# Patient Record
Sex: Female | Born: 1955
Health system: Southern US, Community
[De-identification: ages and names within clinical notes are randomized; demographics above are authoritative.]

## PROBLEM LIST (undated history)

## (undated) DIAGNOSIS — F1910 Other psychoactive substance abuse, uncomplicated: Secondary | ICD-10-CM

## (undated) DIAGNOSIS — I10 Essential (primary) hypertension: Secondary | ICD-10-CM

## (undated) DIAGNOSIS — E119 Type 2 diabetes mellitus without complications: Secondary | ICD-10-CM

## (undated) DIAGNOSIS — K219 Gastro-esophageal reflux disease without esophagitis: Secondary | ICD-10-CM

## (undated) DIAGNOSIS — M1990 Unspecified osteoarthritis, unspecified site: Secondary | ICD-10-CM

## (undated) DIAGNOSIS — N189 Chronic kidney disease, unspecified: Secondary | ICD-10-CM

## (undated) DIAGNOSIS — J449 Chronic obstructive pulmonary disease, unspecified: Secondary | ICD-10-CM

## (undated) DIAGNOSIS — E785 Hyperlipidemia, unspecified: Secondary | ICD-10-CM

## (undated) DIAGNOSIS — I739 Peripheral vascular disease, unspecified: Secondary | ICD-10-CM

## (undated) HISTORY — PX: LYMPHADENECTOMY: SHX15

## (undated) HISTORY — DX: Unspecified osteoarthritis, unspecified site: M19.90

## (undated) HISTORY — DX: Peripheral vascular disease, unspecified: I73.9

## (undated) HISTORY — PX: TONSILLECTOMY: SUR1361

## (undated) HISTORY — DX: Gastro-esophageal reflux disease without esophagitis: K21.9

## (undated) HISTORY — PX: INDUCED ABORTION: SHX677

## (undated) HISTORY — PX: THROAT SURGERY: SHX803

## (undated) HISTORY — PX: TUBAL LIGATION: SHX77

## (undated) HISTORY — DX: Chronic obstructive pulmonary disease, unspecified: J44.9

## (undated) HISTORY — DX: Essential (primary) hypertension: I10

## (undated) HISTORY — DX: Hyperlipidemia, unspecified: E78.5

## (undated) HISTORY — DX: Chronic kidney disease, unspecified: N18.9

## (undated) HISTORY — PX: WISDOM TOOTH EXTRACTION: SHX21

## (undated) HISTORY — PX: ANKLE SURGERY: SHX546

---

## 2005-02-18 ENCOUNTER — Other Ambulatory Visit: Payer: Self-pay

## 2005-03-09 ENCOUNTER — Ambulatory Visit: Payer: Self-pay | Admitting: Unknown Physician Specialty

## 2005-05-27 ENCOUNTER — Emergency Department: Payer: Self-pay | Admitting: Emergency Medicine

## 2005-10-03 ENCOUNTER — Inpatient Hospital Stay: Payer: Self-pay | Admitting: Internal Medicine

## 2006-02-19 ENCOUNTER — Emergency Department: Payer: Self-pay | Admitting: General Practice

## 2007-03-18 IMAGING — CR DG ABDOMEN 1V
1 series · 1 of 1 positions shown · non-contrast
Comparison: none

REASON FOR EXAM: Vomiting
COMMENTS:  LMP: Post-Menopausal

[view not recorded]
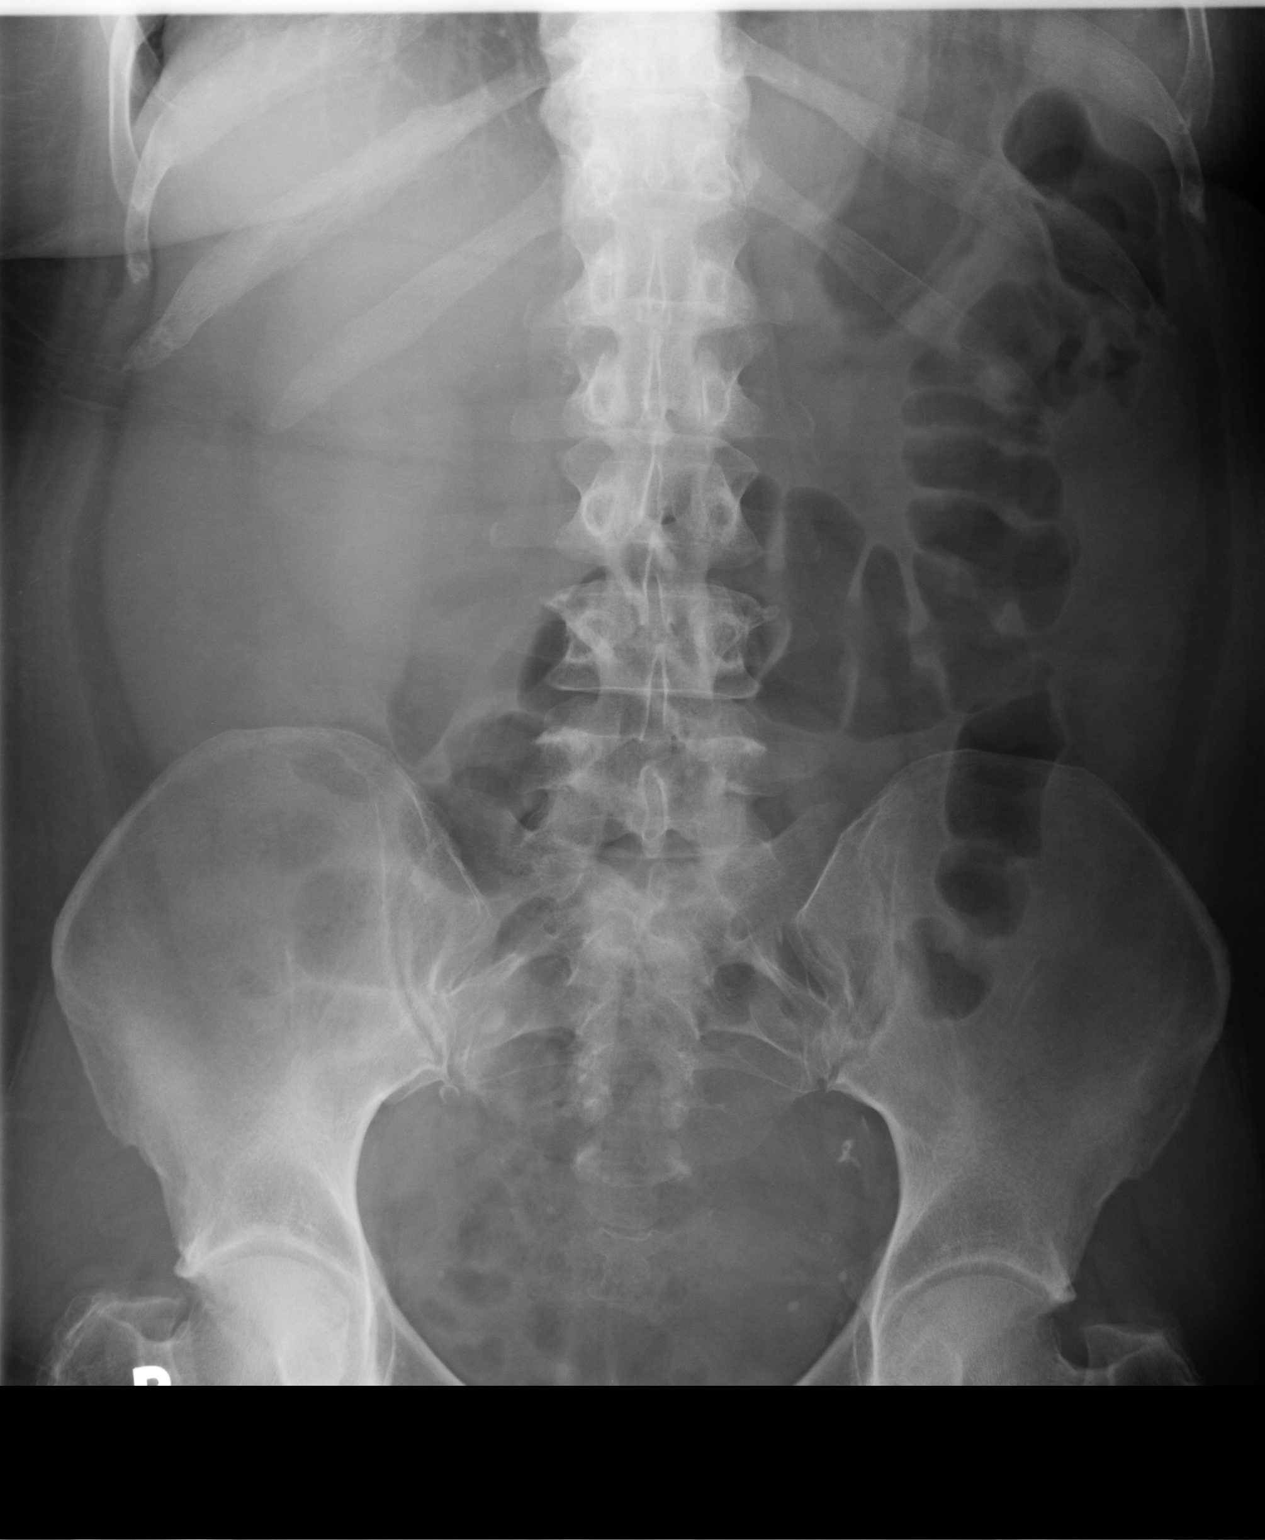

[1 of 1 positions shown; findings below may reference images not displayed]

PROCEDURE:     DXR - DXR KIDNEY URETER BLADDER  - October 03, 2005 [DATE]

RESULT:          Atherosclerotic calcification is seen within the iliac
artery.  Phleboliths are present.  The bowel gas pattern is unremarkable.
No definite urinary tract stones can be identified.  If there is concern for
urinary calculi, then CT correlation would be recommended.
IMPRESSION: No evidence of bowel obstruction.

## 2008-06-27 ENCOUNTER — Emergency Department: Payer: Self-pay | Admitting: Emergency Medicine

## 2009-03-20 ENCOUNTER — Emergency Department: Payer: Self-pay | Admitting: Emergency Medicine

## 2010-02-03 ENCOUNTER — Emergency Department: Payer: Self-pay | Admitting: Emergency Medicine

## 2010-08-11 ENCOUNTER — Emergency Department: Payer: Self-pay | Admitting: Emergency Medicine

## 2011-09-01 ENCOUNTER — Emergency Department: Payer: Self-pay | Admitting: Emergency Medicine

## 2012-05-31 ENCOUNTER — Ambulatory Visit: Payer: Self-pay

## 2012-12-12 ENCOUNTER — Emergency Department: Payer: Self-pay | Admitting: Emergency Medicine

## 2012-12-12 LAB — COMPREHENSIVE METABOLIC PANEL
Albumin: 3.2 g/dL — ABNORMAL LOW (ref 3.4–5.0)
Alkaline Phosphatase: 129 U/L (ref 50–136)
BUN: 16 mg/dL (ref 7–18)
Bilirubin,Total: 0.4 mg/dL (ref 0.2–1.0)
Calcium, Total: 9.4 mg/dL (ref 8.5–10.1)
Chloride: 95 mmol/L — ABNORMAL LOW (ref 98–107)
Co2: 26 mmol/L (ref 21–32)
Creatinine: 1.02 mg/dL (ref 0.60–1.30)
EGFR (Non-African Amer.): >60
Osmolality: 281 (ref 275–301)
Potassium: 2.4 mmol/L — CL (ref 3.5–5.1)
Sodium: 133 mmol/L — ABNORMAL LOW (ref 136–145)
Total Protein: 8.2 g/dL (ref 6.4–8.2)

## 2012-12-12 LAB — CBC
HCT: 38.4 % (ref 35.0–47.0)
HGB: 13.3 g/dL (ref 12.0–16.0)
MCHC: 34.7 g/dL (ref 32.0–36.0)
MCV: 92 fL (ref 80–100)
Platelet: 175 10*3/uL (ref 150–440)
WBC: 13.2 10*3/uL — ABNORMAL HIGH (ref 3.6–11.0)

## 2012-12-12 LAB — BASIC METABOLIC PANEL
Anion Gap: 9 (ref 7–16)
BUN: 12 mg/dL (ref 7–18)
Calcium, Total: 7.8 mg/dL — ABNORMAL LOW (ref 8.5–10.1)
Co2: 24 mmol/L (ref 21–32)
EGFR (African American): >60
EGFR (Non-African Amer.): >60
Osmolality: 287 (ref 275–301)
Sodium: 139 mmol/L (ref 136–145)

## 2012-12-12 LAB — URINALYSIS, COMPLETE
Bilirubin,UR: NEGATIVE
Nitrite: NEGATIVE
Ph: 6 (ref 4.5–8.0)
Protein: 30
WBC UR: 152 /HPF (ref 0–5)

## 2012-12-12 LAB — ETHANOL
Ethanol %: 0.02 % (ref 0.000–0.080)
Ethanol: 20 mg/dL

## 2012-12-14 LAB — URINE CULTURE

## 2014-05-23 ENCOUNTER — Ambulatory Visit: Payer: Self-pay | Admitting: Physician Assistant

## 2016-01-28 ENCOUNTER — Emergency Department: Payer: BLUE CROSS/BLUE SHIELD

## 2016-01-28 ENCOUNTER — Encounter: Payer: Self-pay | Admitting: Emergency Medicine

## 2016-01-28 ENCOUNTER — Emergency Department
Admission: EM | Admit: 2016-01-28 | Discharge: 2016-01-28 | Disposition: A | Discharge status: Home / Self Care | Payer: BLUE CROSS/BLUE SHIELD | Attending: Emergency Medicine | Admitting: Emergency Medicine

## 2016-01-28 DIAGNOSIS — M5432 Sciatica, left side: Secondary | ICD-10-CM

## 2016-01-28 DIAGNOSIS — Z7984 Long term (current) use of oral hypoglycemic drugs: Secondary | ICD-10-CM | POA: Insufficient documentation

## 2016-01-28 DIAGNOSIS — F17200 Nicotine dependence, unspecified, uncomplicated: Secondary | ICD-10-CM | POA: Diagnosis not present

## 2016-01-28 DIAGNOSIS — Z79899 Other long term (current) drug therapy: Secondary | ICD-10-CM | POA: Insufficient documentation

## 2016-01-28 DIAGNOSIS — M545 Low back pain: Secondary | ICD-10-CM | POA: Diagnosis present

## 2016-01-28 MED ORDER — CYCLOBENZAPRINE HCL 5 MG PO TABS: 5.0000 mg | ORAL_TABLET | Freq: Three times a day (TID) | ORAL | Status: DC | PRN

## 2016-01-28 MED ORDER — KETOROLAC TROMETHAMINE 30 MG/ML IJ SOLN
30.0000 mg | Freq: Once | INTRAMUSCULAR | Status: AC
  Administered 2016-01-28: 30 mg via INTRAMUSCULAR
  Filled 2016-01-28: qty 1

## 2016-01-28 NOTE — Discharge Instructions (Signed)
Back Exercises The following exercises strengthen the muscles that help to support the back. They also help to keep the lower back flexible. Doing these exercises can help to prevent back pain or lessen existing pain. If you have back pain or discomfort, try doing these exercises 2-3 times each day or as told by your health care provider. When the pain goes away, do them once each day, but increase the number of times that you repeat the steps for each exercise (do more repetitions). If you do not have back pain or discomfort, do these exercises once each day or as told by your health care provider. EXERCISES Single Knee to Chest Repeat these steps 3-5 times for each leg:  Lie on your back on a firm bed or the floor with your legs extended.  Bring one knee to your chest. Your other leg should stay extended and in contact with the floor.  Hold your knee in place by grabbing your knee or thigh.  Pull on your knee until you feel a gentle stretch in your lower back.  Hold the stretch for 10-30 seconds.  Slowly release and straighten your leg. Pelvic Tilt Repeat these steps 5-10 times:  Lie on your back on a firm bed or the floor with your legs extended.  Bend your knees so they are pointing toward the ceiling and your feet are flat on the floor.  Tighten your lower abdominal muscles to press your lower back against the floor. This motion will tilt your pelvis so your tailbone points up toward the ceiling instead of pointing to your feet or the floor.  With gentle tension and even breathing, hold this position for 5-10 seconds. Cat-Cow Repeat these steps until your lower back becomes more flexible:  Get into a hands-and-knees position on a firm surface. Keep your hands under your shoulders, and keep your knees under your hips. You may place padding under your knees for comfort.  Let your head hang down, and point your tailbone toward the floor so your lower back becomes rounded like the  back of a cat.  Hold this position for 5 seconds.  Slowly lift your head and point your tailbone up toward the ceiling so your back forms a sagging arch like the back of a cow.  Hold this position for 5 seconds. Press-Ups Repeat these steps 5-10 times: 1. Lie on your abdomen (face-down) on the floor. 2. Place your palms near your head, about shoulder-width apart. 3. While you keep your back as relaxed as possible and keep your hips on the floor, slowly straighten your arms to raise the top half of your body and lift your shoulders. Do not use your back muscles to raise your upper torso. You may adjust the placement of your hands to make yourself more comfortable. 4. Hold this position for 5 seconds while you keep your back relaxed. 5. Slowly return to lying flat on the floor. Bridges Repeat these steps 10 times: 1. Lie on your back on a firm surface. 2. Bend your knees so they are pointing toward the ceiling and your feet are flat on the floor. 3. Tighten your buttocks muscles and lift your buttocks off of the floor until your waist is at almost the same height as your knees. You should feel the muscles working in your buttocks and the back of your thighs. If you do not feel these muscles, slide your feet 1-2 inches farther away from your buttocks. 4. Hold this position for 3-5  seconds. 5. Slowly lower your hips to the starting position, and allow your buttocks muscles to relax completely. If this exercise is too easy, try doing it with your arms crossed over your chest. Abdominal Crunches Repeat these steps 5-10 times: 1. Lie on your back on a firm bed or the floor with your legs extended. 2. Bend your knees so they are pointing toward the ceiling and your feet are flat on the floor. 3. Cross your arms over your chest. 4. Tip your chin slightly toward your chest without bending your neck. 5. Tighten your abdominal muscles and slowly raise your trunk (torso) high enough to lift your  shoulder blades a tiny bit off of the floor. Avoid raising your torso higher than that, because it can put too much stress on your low back and it does not help to strengthen your abdominal muscles. 6. Slowly return to your starting position. Back Lifts Repeat these steps 5-10 times: 1. Lie on your abdomen (face-down) with your arms at your sides, and rest your forehead on the floor. 2. Tighten the muscles in your legs and your buttocks. 3. Slowly lift your chest off of the floor while you keep your hips pressed to the floor. Keep the back of your head in line with the curve in your back. Your eyes should be looking at the floor. 4. Hold this position for 3-5 seconds. 5. Slowly return to your starting position. SEEK MEDICAL CARE IF:  Your back pain or discomfort gets much worse when you do an exercise.  Your back pain or discomfort does not lessen within 2 hours after you exercise. If you have any of these problems, stop doing these exercises right away. Do not do them again unless your health care provider says that you can. SEEK IMMEDIATE MEDICAL CARE IF:  You develop sudden, severe back pain. If this happens, stop doing the exercises right away. Do not do them again unless your health care provider says that you can.   This information is not intended to replace advice given to you by your health care provider. Make sure you discuss any questions you have with your health care provider.   Document Released: 10/08/2004 Document Revised: 05/22/2015 Document Reviewed: 10/25/2014 Elsevier Interactive Patient Education 2016 [employer]  Sciatica Sciatica is pain, weakness, numbness, or tingling along your sciatic nerve. The nerve starts in the lower back and runs down the back of each leg. Nerve damage or certain conditions pinch or put pressure on the sciatic nerve. This causes the pain, weakness, and other discomforts of sciatica. HOME CARE   Only take medicine as told by your  doctor.  Apply ice to the affected area for 20 minutes. Do this 3-4 times a day for the first 48-72 hours. Then try heat in the same way.  Exercise, stretch, or do your usual activities if these do not make your pain worse.  Go to physical therapy as told by your doctor.  Keep all doctor visits as told.  Do not wear high heels or shoes that are not supportive.  Get a firm mattress if your mattress is too soft to lessen pain and discomfort. GET HELP RIGHT AWAY IF:   You cannot control when you poop (bowel movement) or pee (urinate).  You have more weakness in your lower back, lower belly (pelvis), butt (buttocks), or legs.  You have redness or puffiness (swelling) of your back.  You have a burning feeling when you pee.  You have pain  that gets worse when you lie down.  You have pain that wakes you from your sleep.  Your pain is worse than past pain.  Your pain lasts longer than 4 weeks.  You are suddenly losing weight without reason. MAKE SURE YOU:   Understand these instructions.  Will watch this condition.  Will get help right away if you are not doing well or get worse.   This information is not intended to replace advice given to you by your health care provider. Make sure you discuss any questions you have with your health care provider.   Document Released: 06/09/2008 Document Revised: 05/22/2015 Document Reviewed: 01/10/2012 Elsevier Interactive Patient Education [employer].

## 2016-01-28 NOTE — ED Provider Notes (Signed)
[facility] Emergency Department Provider Note  ____________________________________________  Time seen: Approximately 11:31 AM  I have reviewed the triage vital signs and the nursing notes.   HISTORY  Chief Complaint Hip Pain    HPI Alexandra Campos is a 60 y.o. female , NAD, presents to the emergency department with left lower back and hip pain for the last 2 weeks. States she has a history of arthritis that can flare from time to time. Had episode of lower back pain a few weeks ago in which lasted 1 day and resolved. Has had lower back pain radiating down into the left hip and leg over the last couple weeks that is not improving. States she has worked for the last 14 days straight which she believes aggravated her pain.Denies any falls, injuries or traumas. Has not noted any redness, warmth, swelling to the left lower extremity. Has been able to ambulate but with increasing pain over the last couple of days. Denies saddle paresthesias nor loss of bowel control. Patient notes she has chronic bladder incontinence but has noted no worsening of such recently. Was referred to Dr. Tamala Julian by her primary care provider but has not followed up with them at this time for her arthritic pain.   History reviewed. No pertinent past medical history.  There are no active problems to display for this patient.   History reviewed. No pertinent past surgical history.  Current Outpatient Rx  Name  Route  Sig  Dispense  Refill  . gabapentin (NEURONTIN) 300 MG capsule   Oral   Take 300 mg by mouth 3 (three) times daily.         . hydrochlorothiazide (HYDRODIURIL) 25 MG tablet   Oral   Take 25 mg by mouth daily.         Marland Kitchen lisinopril (PRINIVIL,ZESTRIL) 20 MG tablet   Oral   Take 20 mg by mouth daily.         . metFORMIN (GLUCOPHAGE) 1000 MG tablet   Oral   Take 1,000 mg by mouth 2 (two) times daily with a meal.         . cyclobenzaprine (FLEXERIL) 5 MG tablet    Oral   Take 1 tablet (5 mg total) by mouth every 8 (eight) hours as needed for muscle spasms.   21 tablet   0     Allergies Review of patient's allergies indicates no known allergies.  No family history on file.  Social History Social History  Substance Use Topics  . Smoking status: Current Every Day Smoker  . Smokeless tobacco: None  . Alcohol Use: None     Review of Systems  Constitutional: No fever/chills, fatigue Cardiovascular: No chest pain. Respiratory: No shortness of breath.  Gastrointestinal: No abdominal pain.  No nausea, vomiting.   Genitourinary: Negative for dysuria. No hematuria. No urinary hesitancy, urgency or increased frequency. Musculoskeletal: Positive left sided lower back pain and left hip pain. Skin: Negative for rash, skin sores. Neurological: Negative for headaches, focal weakness or numbness. No tingling. No saddle paresthesias nor loss of bowel or bladder control. 10-point ROS otherwise negative.  ____________________________________________   PHYSICAL EXAM:  VITAL SIGNS: ED Triage Vitals  Enc Vitals Group     BP 01/28/16 1059 143/97 mmHg     Pulse Rate 01/28/16 1059 92     Resp 01/28/16 1059 18     Temp 01/28/16 1059 98.1 F (36.7 C)     Temp Source 01/28/16 1059 Oral  SpO2 01/28/16 1059 98 %     Weight 01/28/16 1059 220 lb (99.791 kg)     Height 01/28/16 1059 5\' 2"  (1.575 m)     Head Cir --      Peak Flow --      Pain Score 01/28/16 1100 7     Pain Loc --      Pain Edu? --      Excl. in [place]? --      Constitutional: Alert and oriented. Well appearing and in no acute distress. Eyes: Conjunctivae are normal.  Head: Atraumatic. Neck: No cervical spine tenderness to palpation. Supple with full range of motion. Hematological/Lymphatic/Immunilogical: No cervical lymphadenopathy. Cardiovascular: Normal rate, regular rhythm. Normal S1 and S2.  Good peripheral circulation with 2+ pulses noted in bilateral lower  extremities. Respiratory: Normal respiratory effort without tachypnea or retractions. Lungs CTAB with breath sounds noted in all lung fields. Gastrointestinal: Soft and nontender. No distention. No CVA tenderness. Musculoskeletal: Moderate left SI joint tenderness. Positive left-sided straight leg raise, negative right straight leg raise. Decreased range of motion of the lumbar spine due to pain about the left lower back. No tenderness to palpation with pressure of the left hip or pelvic girdle. Full range of motion of the left knee without pain. No lower extremity tenderness nor edema.  No joint effusions. Neurologic:  Normal speech and language. No gross focal neurologic deficits are appreciated.  Skin:  Skin is warm, dry and intact. No rash noted. Psychiatric: Mood and affect are normal. Speech and behavior are normal. Patient exhibits appropriate insight and judgement.   ____________________________________________   LABS  None ____________________________________________  EKG  None ____________________________________________  RADIOLOGY I have personally viewed and evaluated these images (plain radiographs) as part of my medical decision making, as well as reviewing the written report by the radiologist.  Dg Hip Unilat With Pelvis 2-3 Views Left  01/28/2016  CLINICAL DATA:  Left hip pain for 3 weeks without known injury. EXAM: DG HIP (WITH OR WITHOUT PELVIS) 2-3V LEFT COMPARISON:  None. FINDINGS: There is no evidence of hip fracture or dislocation. There is no evidence of arthropathy or other focal bone abnormality. IMPRESSION: Normal left hip. Electronically Signed   By: Marijo Conception, M.D.   On: 01/28/2016 11:54    ____________________________________________    PROCEDURES  Procedure(s) performed: None    Medications  ketorolac (TORADOL) 30 MG/ML injection 30 mg (30 mg Intramuscular Given 01/28/16 1156)     ____________________________________________   INITIAL  IMPRESSION / ASSESSMENT AND PLAN / ED COURSE  Pertinent imaging results that were available during my care of the patient were reviewed by me and considered in my medical decision making (see chart for details).  Patient's diagnosis is consistent with Left-sided sciatica. Patient tolerated Toradol well without side effects and with improvement in pain. Patient will be discharged home with prescriptions for Flexeril to take as directed. Patient may take Tylenol as needed for pain. Patient is to follow up with Dr. Rudene Christians in orthopedics if symptoms persist past this treatment course. Patient is given ED precautions to return to the ED for any worsening or new symptoms.    ____________________________________________  FINAL CLINICAL IMPRESSION(S) / ED DIAGNOSES  Final diagnoses:  Sciatica of left side      NEW MEDICATIONS STARTED DURING THIS VISIT:  New Prescriptions   CYCLOBENZAPRINE (FLEXERIL) 5 MG TABLET    Take 1 tablet (5 mg total) by mouth every 8 (eight) hours as needed for muscle spasms.  Braxton Feathers, PA-C 01/28/16 1207

## 2016-01-28 NOTE — ED Notes (Signed)
Reports left hip pain rad down leg.  +PMS to foot

## 2017-11-20 ENCOUNTER — Emergency Department
Admission: EM | Admit: 2017-11-20 | Discharge: 2017-11-20 | Disposition: A | Discharge status: Home / Self Care | Payer: BLUE CROSS/BLUE SHIELD | Attending: Emergency Medicine | Admitting: Emergency Medicine

## 2017-11-20 ENCOUNTER — Other Ambulatory Visit: Payer: Self-pay

## 2017-11-20 ENCOUNTER — Encounter: Payer: Self-pay | Admitting: Emergency Medicine

## 2017-11-20 DIAGNOSIS — Z5321 Procedure and treatment not carried out due to patient leaving prior to being seen by health care provider: Secondary | ICD-10-CM | POA: Insufficient documentation

## 2017-11-20 DIAGNOSIS — R42 Dizziness and giddiness: Secondary | ICD-10-CM | POA: Insufficient documentation

## 2017-11-20 HISTORY — DX: Type 2 diabetes mellitus without complications: E11.9

## 2017-11-20 LAB — BASIC METABOLIC PANEL
Anion gap: 10 (ref 5–15)
BUN: 24 mg/dL — AB (ref 6–20)
CO2: 27 mmol/L (ref 22–32)
CREATININE: 0.98 mg/dL (ref 0.44–1.00)
Calcium: 9.5 mg/dL (ref 8.9–10.3)
Chloride: 103 mmol/L (ref 101–111)
GFR calc Af Amer: >60 mL/min (ref >60)
Glucose, Bld: 147 mg/dL — ABNORMAL HIGH (ref 65–99)
Potassium: 3.6 mmol/L (ref 3.5–5.1)
SODIUM: 140 mmol/L (ref 135–145)

## 2017-11-20 LAB — CBC
HCT: 40.3 % (ref 35.0–47.0)
Hemoglobin: 13.6 g/dL (ref 12.0–16.0)
MCH: 32.3 pg (ref 26.0–34.0)
MCHC: 33.8 g/dL (ref 32.0–36.0)
MCV: 95.6 fL (ref 80.0–100.0)
PLATELETS: 229 10*3/uL (ref 150–440)
RBC: 4.21 MIL/uL (ref 3.80–5.20)
RDW: 13.8 % (ref 11.5–14.5)
WBC: 5.2 10*3/uL (ref 3.6–11.0)

## 2017-11-20 LAB — GLUCOSE, CAPILLARY: Glucose-Capillary: 134 mg/dL — ABNORMAL HIGH (ref 65–99)

## 2017-11-20 NOTE — ED Triage Notes (Signed)
Patient while at work she became dizzy and nauseated.

## 2017-11-22 ENCOUNTER — Encounter: Payer: Self-pay | Admitting: Emergency Medicine

## 2017-11-22 ENCOUNTER — Emergency Department
Admission: EM | Admit: 2017-11-22 | Discharge: 2017-11-23 | Disposition: A | Discharge status: Home / Self Care | Payer: Self-pay | Attending: Emergency Medicine | Admitting: Emergency Medicine

## 2017-11-22 ENCOUNTER — Other Ambulatory Visit: Payer: Self-pay

## 2017-11-22 DIAGNOSIS — Z79899 Other long term (current) drug therapy: Secondary | ICD-10-CM | POA: Insufficient documentation

## 2017-11-22 DIAGNOSIS — F17210 Nicotine dependence, cigarettes, uncomplicated: Secondary | ICD-10-CM | POA: Insufficient documentation

## 2017-11-22 DIAGNOSIS — R42 Dizziness and giddiness: Secondary | ICD-10-CM | POA: Insufficient documentation

## 2017-11-22 DIAGNOSIS — E119 Type 2 diabetes mellitus without complications: Secondary | ICD-10-CM | POA: Insufficient documentation

## 2017-11-22 DIAGNOSIS — Z7984 Long term (current) use of oral hypoglycemic drugs: Secondary | ICD-10-CM | POA: Insufficient documentation

## 2017-11-22 DIAGNOSIS — E860 Dehydration: Secondary | ICD-10-CM | POA: Insufficient documentation

## 2017-11-22 LAB — COMPREHENSIVE METABOLIC PANEL
ALK PHOS: 65 U/L (ref 38–126)
ALT: 15 U/L (ref 14–54)
AST: 14 U/L — ABNORMAL LOW (ref 15–41)
Albumin: 4.1 g/dL (ref 3.5–5.0)
Anion gap: 12 (ref 5–15)
BILIRUBIN TOTAL: 0.6 mg/dL (ref 0.3–1.2)
BUN: 21 mg/dL — ABNORMAL HIGH (ref 6–20)
CALCIUM: 9.8 mg/dL (ref 8.9–10.3)
CO2: 28 mmol/L (ref 22–32)
CREATININE: 0.99 mg/dL (ref 0.44–1.00)
Chloride: 98 mmol/L — ABNORMAL LOW (ref 101–111)
GFR calc non Af Amer: >60 mL/min (ref >60)
Glucose, Bld: 170 mg/dL — ABNORMAL HIGH (ref 65–99)
Potassium: 3.8 mmol/L (ref 3.5–5.1)
SODIUM: 138 mmol/L (ref 135–145)
TOTAL PROTEIN: 8.1 g/dL (ref 6.5–8.1)

## 2017-11-22 LAB — CBC
HEMATOCRIT: 41.6 % (ref 35.0–47.0)
HEMOGLOBIN: 13.8 g/dL (ref 12.0–16.0)
MCH: 31.6 pg (ref 26.0–34.0)
MCHC: 33.2 g/dL (ref 32.0–36.0)
MCV: 95.5 fL (ref 80.0–100.0)
Platelets: 242 10*3/uL (ref 150–440)
RBC: 4.36 MIL/uL (ref 3.80–5.20)
RDW: 13.4 % (ref 11.5–14.5)
WBC: 6.1 10*3/uL (ref 3.6–11.0)

## 2017-11-22 LAB — TROPONIN I
Troponin I: <0.03 ng/mL (ref <0.03)
Troponin I: <0.03 ng/mL (ref <0.03)

## 2017-11-22 MED ORDER — SODIUM CHLORIDE 0.9 % IV BOLUS (SEPSIS)
1000.0000 mL | Freq: Once | INTRAVENOUS | Status: AC
  Administered 2017-11-22: 1000 mL via INTRAVENOUS

## 2017-11-22 MED ORDER — MECLIZINE HCL 25 MG PO TABS
25.0000 mg | ORAL_TABLET | Freq: Once | ORAL | Status: AC
  Administered 2017-11-22: 25 mg via ORAL
  Filled 2017-11-22: qty 1

## 2017-11-22 NOTE — Discharge Instructions (Signed)
Fortunately today your blood work was reassuring.  Please make sure you remain well-hydrated and follow-up with your primary care physician in 2 days for reevaluation.  It was a pleasure to take care of you today, and thank you for coming to our emergency department.  If you have any questions or concerns before leaving please ask the nurse to grab me and I'm more than happy to go through your aftercare instructions again.  If you were prescribed any opioid pain medication today such as Norco, Vicodin, Percocet, morphine, hydrocodone, or oxycodone please make sure you do not drive when you are taking this medication as it can alter your ability to drive safely.  If you have any concerns once you are home that you are not improving or are in fact getting worse before you can make it to your follow-up appointment, please do not hesitate to call 911 and come back for further evaluation.  Darel Hong, MD  Results for orders placed or performed during the hospital encounter of 11/22/17  CBC  Result Value Ref Range   WBC 6.1 3.6 - 11.0 K/uL   RBC 4.36 3.80 - 5.20 MIL/uL   Hemoglobin 13.8 12.0 - 16.0 g/dL   HCT 41.6 35.0 - 47.0 %   MCV 95.5 80.0 - 100.0 fL   MCH 31.6 26.0 - 34.0 pg   MCHC 33.2 32.0 - 36.0 g/dL   RDW 13.4 11.5 - 14.5 %   Platelets 242 150 - 440 K/uL  Troponin I  Result Value Ref Range   Troponin I <0.03 <0.03 ng/mL  Comprehensive metabolic panel  Result Value Ref Range   Sodium 138 135 - 145 mmol/L   Potassium 3.8 3.5 - 5.1 mmol/L   Chloride 98 (L) 101 - 111 mmol/L   CO2 28 22 - 32 mmol/L   Glucose, Bld 170 (H) 65 - 99 mg/dL   BUN 21 (H) 6 - 20 mg/dL   Creatinine, Ser 0.99 0.44 - 1.00 mg/dL   Calcium 9.8 8.9 - 10.3 mg/dL   Total Protein 8.1 6.5 - 8.1 g/dL   Albumin 4.1 3.5 - 5.0 g/dL   AST 14 (L) 15 - 41 U/L   ALT 15 14 - 54 U/L   Alkaline Phosphatase 65 38 - 126 U/L   Total Bilirubin 0.6 0.3 - 1.2 mg/dL   GFR calc non Af Amer >60 >60 mL/min   GFR calc Af Amer  >60 >60 mL/min   Anion gap 12 5 - 15  Troponin I  Result Value Ref Range   Troponin I <0.03 <0.03 ng/mL

## 2017-11-22 NOTE — ED Provider Notes (Signed)
[facility] Emergency Department Provider Note  ____________________________________________   First MD Initiated Contact with Patient 11/22/17 2120     (approximate)  I have reviewed the triage vital signs and the nursing notes.   HISTORY  Chief Complaint Dizziness and Arm Pain   HPI Alexandra Campos is a 62 y.o. female who self presents to the emergency department with several days of intermittent lightheadedness.  Her symptoms are mild to moderate severity lasting 1-2 minutes at a time.  They are worsened by standing up and improved with rest.  Associated with some nausea and numbness in her left arm.  No vomiting.  No pain.  No tinnitus.  No room spinning.  No history of the same.  No headache.  Her symptoms are currently mild to moderate intermittent.  Past Medical History:  Diagnosis Date  . Diabetes mellitus without complication ([place])     There are no active problems to display for this patient.   History reviewed. No pertinent surgical history.  Prior to Admission medications   Medication Sig Start Date End Date Taking? Authorizing Provider  cyclobenzaprine (FLEXERIL) 5 MG tablet Take 1 tablet (5 mg total) by mouth every 8 (eight) hours as needed for muscle spasms. 01/28/16   Hagler, Jami L, PA-C  gabapentin (NEURONTIN) 300 MG capsule Take 300 mg by mouth 3 (three) times daily.    [provider]  hydrochlorothiazide (HYDRODIURIL) 25 MG tablet Take 25 mg by mouth daily.    [provider]  lisinopril (PRINIVIL,ZESTRIL) 20 MG tablet Take 20 mg by mouth daily.    [provider]  metFORMIN (GLUCOPHAGE) 1000 MG tablet Take 1,000 mg by mouth 2 (two) times daily with a meal.    [provider]    Allergies Patient has no known allergies.  No family history on file.  Social History Social History   Tobacco Use  . Smoking status: Current Every Day Smoker    Packs/day: 1.00    Types: Cigarettes  Substance Use  Topics  . Alcohol use: Not on file  . Drug use: Not on file    Review of Systems Constitutional: No fever/chills Eyes: No visual changes. ENT: No sore throat. Cardiovascular: Denies chest pain. Respiratory: Denies shortness of breath. Gastrointestinal: No abdominal pain.  Positive for nausea, no vomiting.  No diarrhea.  No constipation. Genitourinary: Negative for dysuria. Musculoskeletal: Negative for back pain. Skin: Negative for rash. Neurological: Negative for headaches, focal weakness or numbness.   ____________________________________________   PHYSICAL EXAM:  VITAL SIGNS: ED Triage Vitals  Enc Vitals Group     BP 11/22/17 1753 (!) 179/90     Pulse Rate 11/22/17 1753 81     Resp 11/22/17 1753 18     Temp 11/22/17 1753 98.4 F (36.9 C)     Temp Source 11/22/17 1753 Oral     SpO2 11/22/17 1753 99 %     Weight 11/22/17 1754 225 lb (102.1 kg)     Height 11/22/17 1754 5' (1.524 m)     Head Circumference --      Peak Flow --      Pain Score 11/22/17 1754 8     Pain Loc --      Pain Edu? --      Excl. in [place]? --     Constitutional: Alert and oriented x4 pleasant cooperative speaks in full clear sentences no diaphoresis Eyes: PERRL EOMI. no nystagmus Head: Atraumatic.  Normal tympanic membranes bilaterally Nose: No  congestion/rhinnorhea. Mouth/Throat: No trismus Neck: No stridor.   Cardiovascular: Normal rate, regular rhythm. Grossly normal heart sounds.  Good peripheral circulation. Respiratory: Normal respiratory effort.  No retractions. Lungs CTAB and moving good air Gastrointestinal: Soft nontender Musculoskeletal: No lower extremity edema   Neurologic:  Normal speech and language.  No pronator drift 5 out of 5 grips biceps triceps hip flexion hip extension plantar flexion dorsiflexion sensation intact light touch throughout normal finger-nose-finger Skin:  Skin is warm, dry and intact. No rash noted. Psychiatric: Mood and affect are normal. Speech and  behavior are normal.    ____________________________________________   DIFFERENTIAL includes but not limited to  Central vertigo, peripheral vertigo, lightheaded, dehydration, acute coronary syndrome ____________________________________________   LABS (all labs ordered are listed, but only abnormal results are displayed)  Labs Reviewed  COMPREHENSIVE METABOLIC PANEL - Abnormal; Notable for the following components:      Result Value   Chloride 98 (*)    Glucose, Bld 170 (*)    BUN 21 (*)    AST 14 (*)    All other components within normal limits  CBC  TROPONIN I  TROPONIN I    Lab work reviewed by me with no signs of acute ischemia x2 __________________________________________  EKG  ED ECG REPORT I, Darel Hong, the attending physician, personally viewed and interpreted this ECG.  Date: 11/22/2017 EKG Time:  Rate: 77 Rhythm: normal sinus rhythm QRS Axis: normal Intervals: normal ST/T Wave abnormalities: normal Narrative Interpretation: no evidence of acute ischemia  ____________________________________________  RADIOLOGY   ____________________________________________   PROCEDURES  Procedure(s) performed: no  Procedures  Critical Care performed: no  Observation: no ____________________________________________   INITIAL IMPRESSION / ASSESSMENT AND PLAN / ED COURSE  Pertinent labs & imaging results that were available during my care of the patient were reviewed by me and considered in my medical decision making (see chart for details).  Patient symptoms are not consistent with vertigo.  She is troponin negative x2 and her EKG is nonischemic.  She feels improved after IV fluids.  I had a lengthy discussion with patient regarding the diagnostic uncertainty and the importance of following up with primary care.  She verbalized understanding agree with plan.      ____________________________________________   FINAL CLINICAL IMPRESSION(S) / ED  DIAGNOSES  Final diagnoses:  Dizziness  Dehydration      NEW MEDICATIONS STARTED DURING THIS VISIT:  New Prescriptions   No medications on file     Note:  This document was prepared using Dragon voice recognition software and may include unintentional dictation errors.     Darel Hong, MD 11/23/17 0005

## 2017-11-22 NOTE — ED Triage Notes (Signed)
States approx 1530 at work felt weak and dizzy L arm pain, states dizziness lasted approx 2 min.

## 2018-06-21 ENCOUNTER — Ambulatory Visit: Payer: Self-pay

## 2018-06-30 ENCOUNTER — Other Ambulatory Visit: Payer: Self-pay

## 2018-06-30 ENCOUNTER — Encounter: Payer: Self-pay | Admitting: Adult Health

## 2018-06-30 ENCOUNTER — Ambulatory Visit: Payer: Self-pay | Admitting: Gerontology

## 2018-06-30 VITALS — BP 152/101 | HR 90 | Ht 60.0 in | Wt 218.7 lb

## 2018-06-30 DIAGNOSIS — I152 Hypertension secondary to endocrine disorders: Secondary | ICD-10-CM

## 2018-06-30 DIAGNOSIS — K029 Dental caries, unspecified: Secondary | ICD-10-CM

## 2018-06-30 DIAGNOSIS — E1159 Type 2 diabetes mellitus with other circulatory complications: Secondary | ICD-10-CM

## 2018-06-30 DIAGNOSIS — E119 Type 2 diabetes mellitus without complications: Secondary | ICD-10-CM

## 2018-06-30 DIAGNOSIS — R4586 Emotional lability: Secondary | ICD-10-CM

## 2018-06-30 DIAGNOSIS — I10 Essential (primary) hypertension: Secondary | ICD-10-CM

## 2018-06-30 DIAGNOSIS — M159 Polyosteoarthritis, unspecified: Secondary | ICD-10-CM

## 2018-06-30 DIAGNOSIS — Z789 Other specified health status: Secondary | ICD-10-CM

## 2018-06-30 DIAGNOSIS — M150 Primary generalized (osteo)arthritis: Secondary | ICD-10-CM

## 2018-06-30 DIAGNOSIS — K047 Periapical abscess without sinus: Secondary | ICD-10-CM

## 2018-06-30 DIAGNOSIS — N393 Stress incontinence (female) (male): Secondary | ICD-10-CM

## 2018-06-30 MED ORDER — LOSARTAN POTASSIUM 50 MG PO TABS: 50.0000 mg | ORAL_TABLET | Freq: Every day | ORAL | 0 refills | Status: DC

## 2018-06-30 MED ORDER — IBUPROFEN 200 MG PO TABS: 200.0000 mg | ORAL_TABLET | Freq: Four times a day (QID) | ORAL | 2 refills | Status: DC | PRN

## 2018-06-30 MED ORDER — CETIRIZINE HCL 10 MG PO TABS: 10.0000 mg | ORAL_TABLET | Freq: Every day | ORAL | 2 refills | Status: DC

## 2018-06-30 MED ORDER — GABAPENTIN 300 MG PO CAPS: 300.0000 mg | ORAL_CAPSULE | Freq: Every day | ORAL | 0 refills | Status: DC

## 2018-06-30 MED ORDER — METFORMIN HCL 1000 MG PO TABS: 1000.0000 mg | ORAL_TABLET | Freq: Every day | ORAL | 3 refills | Status: DC

## 2018-06-30 NOTE — Patient Instructions (Addendum)
Arthritis Arthritis means joint pain. It can also mean joint disease. A joint is a place where bones come together. People who have arthritis may have:  Red joints.  Swollen joints.  Stiff joints.  Warm joints.  A fever.  A feeling of being sick.  Follow these instructions at home: Pay attention to any changes in your symptoms. Take these actions to help with your pain and swelling. Medicines  Take over-the-counter and prescription medicines only as told by your doctor.  Do not take aspirin for pain if your doctor says that you may have gout. Activity  Rest your joint if your doctor tells you to.  Avoid activities that make the pain worse.  Exercise your joint regularly as told by your doctor. Try doing exercises like: ? Swimming. ? Water aerobics. ? Biking. ? Walking. Joint Care   If your joint is swollen, keep it raised (elevated) if told by your doctor.  If your joint feels stiff in the morning, try taking a warm shower.  If you have diabetes, do not apply heat without asking your doctor.  If told, apply heat to the joint: ? Put a towel between the joint and the hot pack or heating pad. ? Leave the heat on the area for 20-30 minutes.  If told, apply ice to the joint: ? Put ice in a plastic bag. ? Place a towel between your skin and the bag. ? Leave the ice on for 20 minutes, 2-3 times per day.  Keep all follow-up visits as told by your doctor. Contact a doctor if:  The pain gets worse.  You have a fever. Get help right away if:  You have very bad pain in your joint.  You have swelling in your joint.  Your joint is red.  Many joints become painful and swollen.  You have very bad back pain.  Your leg is very weak.  You cannot control your pee (urine) or poop (stool). This information is not intended to replace advice given to you by your health care provider. Make sure you discuss any questions you have with your health care provider. Document  Released: 11/25/2009 Document Revised: 02/06/2016 Document Reviewed: 11/26/2014 Elsevier Interactive Patient Education  2018 [employer]. Kegel Exercises Kegel exercises help strengthen the muscles that support the rectum, vagina, small intestine, bladder, and uterus. Doing Kegel exercises can help:  Improve bladder and bowel control.  Improve sexual response.  Reduce problems and discomfort during pregnancy.  Kegel exercises involve squeezing your pelvic floor muscles, which are the same muscles you squeeze when you try to stop the flow of urine. The exercises can be done while sitting, standing, or lying down, but it is best to vary your position. Phase 1 exercises 1. Squeeze your pelvic floor muscles tight. You should feel a tight lift in your rectal area. If you are a female, you should also feel a tightness in your vaginal area. Keep your stomach, buttocks, and legs relaxed. 2. Hold the muscles tight for up to 10 seconds. 3. Relax your muscles. Repeat this exercise 50 times a day or as many times as told by your health care provider. Continue to do this exercise for at least 4-6 weeks or for as long as told by your health care provider. This information is not intended to replace advice given to you by your health care provider. Make sure you discuss any questions you have with your health care provider. Document Released: 08/17/2012 Document Revised: 04/25/2016 Document Reviewed: 07/21/2015  Chartered certified accountant Patient Education  2018 [place] Eating Plan DASH stands for "Dietary Approaches to Stop Hypertension." The DASH eating plan is a healthy eating plan that has been shown to reduce high blood pressure (hypertension). It may also reduce your risk for type 2 diabetes, heart disease, and stroke. The DASH eating plan may also help with weight loss. What are tips for following this plan? General guidelines  Avoid eating more than 2,300 mg (milligrams) of salt (sodium) a  day. If you have hypertension, you may need to reduce your sodium intake to 1,500 mg a day.  Limit alcohol intake to no more than 1 drink a day for nonpregnant women and 2 drinks a day for men. One drink equals 12 oz of beer, 5 oz of wine, or 1 oz of hard liquor.  Work with your health care provider to maintain a healthy body weight or to lose weight. Ask what an ideal weight is for you.  Get at least 30 minutes of exercise that causes your heart to beat faster (aerobic exercise) most days of the week. Activities may include walking, swimming, or biking.  Work with your health care provider or diet and nutrition specialist (dietitian) to adjust your eating plan to your individual calorie needs. Reading food labels  Check food labels for the amount of sodium per serving. Choose foods with less than 5 percent of the Daily Value of sodium. Generally, foods with less than 300 mg of sodium per serving fit into this eating plan.  To find whole grains, look for the word "whole" as the first word in the ingredient list. Shopping  Buy products labeled as "low-sodium" or "no salt added."  Buy fresh foods. Avoid canned foods and premade or frozen meals. Cooking  Avoid adding salt when cooking. Use salt-free seasonings or herbs instead of table salt or sea salt. Check with your health care provider or pharmacist before using salt substitutes.  Do not fry foods. Cook foods using healthy methods such as baking, boiling, grilling, and broiling instead.  Cook with heart-healthy oils, such as olive, canola, soybean, or sunflower oil. Meal planning   Eat a balanced diet that includes: ? 5 or more servings of fruits and vegetables each day. At each meal, try to fill half of your plate with fruits and vegetables. ? Up to 6-8 servings of whole grains each day. ? Less than 6 oz of lean meat, poultry, or fish each day. A 3-oz serving of meat is about the same size as a deck of cards. One egg equals 1  oz. ? 2 servings of low-fat dairy each day. ? A serving of nuts, seeds, or beans 5 times each week. ? Heart-healthy fats. Healthy fats called Omega-3 fatty acids are found in foods such as flaxseeds and coldwater fish, like sardines, salmon, and mackerel.  Limit how much you eat of the following: ? Canned or prepackaged foods. ? Food that is high in trans fat, such as fried foods. ? Food that is high in saturated fat, such as fatty meat. ? Sweets, desserts, sugary drinks, and other foods with added sugar. ? Full-fat dairy products.  Do not salt foods before eating.  Try to eat at least 2 vegetarian meals each week.  Eat more home-cooked food and less restaurant, buffet, and fast food.  When eating at a restaurant, ask that your food be prepared with less salt or no salt, if possible. What foods are recommended? The items listed may not be  a complete list. Talk with your dietitian about what dietary choices are best for you. Grains Whole-grain or whole-wheat bread. Whole-grain or whole-wheat pasta. Brown rice. [place]. Bulgur. Whole-grain and low-sodium cereals. Pita bread. Low-fat, low-sodium crackers. Whole-wheat flour tortillas. Vegetables Fresh or frozen vegetables (raw, steamed, roasted, or grilled). Low-sodium or reduced-sodium tomato and vegetable juice. Low-sodium or reduced-sodium tomato sauce and tomato paste. Low-sodium or reduced-sodium canned vegetables. Fruits All fresh, dried, or frozen fruit. Canned fruit in natural juice (without added sugar). Meat and other protein foods Skinless chicken or [place]. Ground chicken or [place]. Pork with fat trimmed off. Fish and seafood. Egg whites. Dried beans, peas, or lentils. Unsalted nuts, nut butters, and seeds. Unsalted canned beans. Lean cuts of beef with fat trimmed off. Low-sodium, lean deli meat. Dairy Low-fat (1%) or fat-free (skim) milk. Fat-free, low-fat, or reduced-fat cheeses. Nonfat, low-sodium ricotta or cottage  cheese. Low-fat or nonfat yogurt. Low-fat, low-sodium cheese. Fats and oils Soft margarine without trans fats. Vegetable oil. Low-fat, reduced-fat, or light mayonnaise and salad dressings (reduced-sodium). Canola, safflower, olive, soybean, and sunflower oils. Avocado. Seasoning and other foods Herbs. Spices. Seasoning mixes without salt. Unsalted popcorn and pretzels. Fat-free sweets. What foods are not recommended? The items listed may not be a complete list. Talk with your dietitian about what dietary choices are best for you. Grains Baked goods made with fat, such as croissants, muffins, or some breads. Dry pasta or rice meal packs. Vegetables Creamed or fried vegetables. Vegetables in a cheese sauce. Regular canned vegetables (not low-sodium or reduced-sodium). Regular canned tomato sauce and paste (not low-sodium or reduced-sodium). Regular tomato and vegetable juice (not low-sodium or reduced-sodium). Angie Fava. Olives. Fruits Canned fruit in a light or heavy syrup. Fried fruit. Fruit in cream or butter sauce. Meat and other protein foods Fatty cuts of meat. Ribs. Fried meat. Berniece Salines. Sausage. Bologna and other processed lunch meats. Salami. Fatback. Hotdogs. Bratwurst. Salted nuts and seeds. Canned beans with added salt. Canned or smoked fish. Whole eggs or egg yolks. Chicken or [place] with skin. Dairy Whole or 2% milk, cream, and half-and-half. Whole or full-fat cream cheese. Whole-fat or sweetened yogurt. Full-fat cheese. Nondairy creamers. Whipped toppings. Processed cheese and cheese spreads. Fats and oils Butter. Stick margarine. Lard. Shortening. Ghee. Bacon fat. Tropical oils, such as coconut, palm kernel, or palm oil. Seasoning and other foods Salted popcorn and pretzels. Onion salt, garlic salt, seasoned salt, table salt, and sea salt. Worcestershire sauce. Tartar sauce. Barbecue sauce. Teriyaki sauce. Soy sauce, including reduced-sodium. Steak sauce. Canned and packaged gravies.  Fish sauce. Oyster sauce. Cocktail sauce. Horseradish that you find on the shelf. Ketchup. Mustard. Meat flavorings and tenderizers. Bouillon cubes. Hot sauce and Tabasco sauce. Premade or packaged marinades. Premade or packaged taco seasonings. Relishes. Regular salad dressings. Where to find more information:  National Heart, Lung, and [place]: https://wilson-eaton.com/  American Heart Association: www.heart.org Summary  The DASH eating plan is a healthy eating plan that has been shown to reduce high blood pressure (hypertension). It may also reduce your risk for type 2 diabetes, heart disease, and stroke.  With the DASH eating plan, you should limit salt (sodium) intake to 2,300 mg a day. If you have hypertension, you may need to reduce your sodium intake to 1,500 mg a day.  When on the DASH eating plan, aim to eat more fresh fruits and vegetables, whole grains, lean proteins, low-fat dairy, and heart-healthy fats.  Work with your health care provider or diet and nutrition specialist (  dietitian) to adjust your eating plan to your individual calorie needs. This information is not intended to replace advice given to you by your health care provider. Make sure you discuss any questions you have with your health care provider. Document Released: 08/20/2011 Document Revised: 08/24/2016 Document Reviewed: 08/24/2016 Elsevier Interactive Patient Education  2018 [employer]. Diabetes Insipidus Diabetes insipidus (DI) is a rare condition that causes the body to produce more urine than normal, which leads to thirst and dehydration. The urine is made mostly of water (dilute urine). There are four types of DI:  Central DI. This is the most common type.  Dipsogenic DI.  Nephrogenic DI.  Pregnancy-related DI.  The most common forms are related to decreased production of the hormone that regulates urine production (antidiuretic hormone) or resistance to this hormone. DI can be managed with  treatment and is not usually serious. This condition is not related to type 1 or type 2 diabetes mellitus, in which blood sugar (glucose) levels are too high. DI affects mostly adults, but it can happen at any age. What are the causes? Central DI is caused by damage to the pituitary gland or hypothalamus in the brain. Dipsogenic DI is caused by a defect in the thirst mechanism in the brain. This defect causes you to drink too much fluid. These may result from:  Brain surgery.  Infection.  Inflammation.  Brain tumor.  Head injury.  Nephrogenic DI is caused by the kidneys not responding to the antidiuretic hormone in the body. This may result from:  Chronic kidney disease (CKD).  Certain medicines, such as lithium.  Low potassium levels.  High calcium levels.  Pregnancy-related DI is caused by the antidiuretic hormone not working properly in the body. This results from having a temporary form of diabetes mellitus that develops during pregnancy (gestational diabetes mellitus). What are the signs or symptoms? Symptoms of this condition include:  Excessive urination. This means urinating more than 10 cups (2.4 L) during a period of 24 hours.  Excessive thirst.  Frequent nighttime urination (nocturia).  Nausea.  Diarrhea.  How is this diagnosed? This condition may be diagnosed based on:  Your medical history.  A physical exam.  Blood tests.  Urine tests.  How is this treated? Once your specific type of diabetes insipidus is diagnosed, treatment may include one or more of the following:  Increasing or limiting your fluid intake.  Taking medicines that contain artificial (synthetic) versions of the antidiuretic hormone.  Stopping certain medicines that you take.  Correcting the balance of minerals (electrolytes) in your body.  Changing your diet. You may be put on a low-protein or low-sodium diet.  You may need to visit your health care provider regularly to make  sure your condition is being treated properly. You may also need to work with providers who specialize in:  Kidney problems (nephrologist).  Hormone disorders (endocrinologist).  Follow these instructions at home:  Follow instructions from your health care provider about how much fluid and water to drink. You may be directed to drink more fluids and water, or to limit how much fluid and water you drink.  Follow instructions from your health care provider about eating or drinking restrictions.  Take over-the-counter and prescription medicines only as told by your health care provider.  Return to your normal activities as told by your health care provider. Ask your health care provider what activities are safe for you.  If directed, monitor your risk of dehydration in extreme heat.  Keep all follow-up visits as told by your health care provider. This is important.  Carry a medical alert card or wear medical alert jewelry. Contact a health care provider if:  You continue to have symptoms after treatment. Get help right away if:  You have extreme thirst.  You have symptoms of severe dehydration, such as rapid heart rate, muscle cramps, or confusion. Summary  Diabetes insipidus (DI) is a rare condition that causes the body to produce more urine than normal, which leads to thirst and dehydration.  Follow instructions from your health care provider about eating or drinking restrictions.  Treatment may include increasing or limiting your fluid intake and correcting the balance of minerals (electrolytes) in your body.  Get help right away if you have symptoms of severe dehydration, such as rapid heart rate, muscle cramps, or confusion. This information is not intended to replace advice given to you by your health care provider. Make sure you discuss any questions you have with your health care provider. Document Released: 09/05/2013 Document Revised: 06/09/2016 Document Reviewed:  06/01/2016 Elsevier Interactive Patient Education  2018 [place] for [employer] Calories are units of energy. Your body needs a certain amount of calories from food to keep you going throughout the day. When you eat more calories than your body needs, your body stores the extra calories as fat. When you eat fewer calories than your body needs, your body burns fat to get the energy it needs. Calorie counting means keeping track of how many calories you eat and drink each day. Calorie counting can be helpful if you need to lose weight. If you make sure to eat fewer calories than your body needs, you should lose weight. Ask your health care provider what a healthy weight is for you. For calorie counting to work, you will need to eat the right number of calories in a day in order to lose a healthy amount of weight per week. A dietitian can help you determine how many calories you need in a day and will give you suggestions on how to reach your calorie goal.  A healthy amount of weight to lose per week is usually 1-2 lb (0.5-0.9 kg). This usually means that your daily calorie intake should be reduced by 500-750 calories.  Eating 1,200 - 1,500 calories per day can help most women lose weight.  Eating 1,500 - 1,800 calories per day can help most men lose weight.  What is my plan? My goal is to have __________ calories per day. If I have this many calories per day, I should lose around __________ pounds per week. What do I need to know about calorie counting? In order to meet your daily calorie goal, you will need to:  Find out how many calories are in each food you would like to eat. Try to do this before you eat.  Decide how much of the food you plan to eat.  Write down what you ate and how many calories it had. Doing this is called keeping a food log.  To successfully lose weight, it is important to balance calorie counting with a healthy lifestyle that includes regular  activity. Aim for 150 minutes of moderate exercise (such as walking) or 75 minutes of vigorous exercise (such as running) each week. Where do I find calorie information?  The number of calories in a food can be found on a Nutrition Facts label. If a food does not have a Nutrition Facts label,  try to look up the calories online or ask your dietitian for help. Remember that calories are listed per serving. If you choose to have more than one serving of a food, you will have to multiply the calories per serving by the amount of servings you plan to eat. For example, the label on a package of bread might say that a serving size is 1 slice and that there are 90 calories in a serving. If you eat 1 slice, you will have eaten 90 calories. If you eat 2 slices, you will have eaten 180 calories. How do I keep a food log? Immediately after each meal, record the following information in your food log:  What you ate. Don't forget to include toppings, sauces, and other extras on the food.  How much you ate. This can be measured in cups, ounces, or number of items.  How many calories each food and drink had.  The total number of calories in the meal.  Keep your food log near you, such as in a small notebook in your pocket, or use a mobile app or website. Some programs will calculate calories for you and show you how many calories you have left for the day to meet your goal. What are some calorie counting tips?  Use your calories on foods and drinks that will fill you up and not leave you hungry: ? Some examples of foods that fill you up are nuts and nut butters, vegetables, lean proteins, and high-fiber foods like whole grains. High-fiber foods are foods with more than 5 g fiber per serving. ? Drinks such as sodas, specialty coffee drinks, alcohol, and juices have a lot of calories, yet do not fill you up.  Eat nutritious foods and avoid empty calories. Empty calories are calories you get from foods or  beverages that do not have many vitamins or protein, such as candy, sweets, and soda. It is better to have a nutritious high-calorie food (such as an avocado) than a food with few nutrients (such as a bag of chips).  Know how many calories are in the foods you eat most often. This will help you calculate calorie counts faster.  Pay attention to calories in drinks. Low-calorie drinks include water and unsweetened drinks.  Pay attention to nutrition labels for "low fat" or "fat free" foods. These foods sometimes have the same amount of calories or more calories than the full fat versions. They also often have added sugar, starch, or salt, to make up for flavor that was removed with the fat.  Find a way of tracking calories that works for you. Get creative. Try different apps or programs if writing down calories does not work for you. What are some portion control tips?  Know how many calories are in a serving. This will help you know how many servings of a certain food you can have.  Use a measuring cup to measure serving sizes. You could also try weighing out portions on a kitchen scale. With time, you will be able to estimate serving sizes for some foods.  Take some time to put servings of different foods on your favorite plates, bowls, and cups so you know what a serving looks like.  Try not to eat straight from a bag or box. Doing this can lead to overeating. Put the amount you would like to eat in a cup or on a plate to make sure you are eating the right portion.  Use smaller plates, glasses, and  bowls to prevent overeating.  Try not to multitask (for example, watch TV or use your computer) while eating. If it is time to eat, sit down at a table and enjoy your food. This will help you to know when you are full. It will also help you to be aware of what you are eating and how much you are eating. What are tips for following this plan? Reading food labels  Check the calorie count compared  to the serving size. The serving size may be smaller than what you are used to eating.  Check the source of the calories. Make sure the food you are eating is high in vitamins and protein and low in saturated and trans fats. Shopping  Read nutrition labels while you shop. This will help you make healthy decisions before you decide to purchase your food.  Make a grocery list and stick to it. Cooking  Try to cook your favorite foods in a healthier way. For example, try baking instead of frying.  Use low-fat dairy products. Meal planning  Use more fruits and vegetables. Half of your plate should be fruits and vegetables.  Include lean proteins like poultry and fish. How do I count calories when eating out?  Ask for smaller portion sizes.  Consider sharing an entree and sides instead of getting your own entree.  If you get your own entree, eat only half. Ask for a box at the beginning of your meal and put the rest of your entree in it so you are not tempted to eat it.  If calories are listed on the menu, choose the lower calorie options.  Choose dishes that include vegetables, fruits, whole grains, low-fat dairy products, and lean protein.  Choose items that are boiled, broiled, grilled, or steamed. Stay away from items that are buttered, battered, fried, or served with cream sauce. Items labeled "crispy" are usually fried, unless stated otherwise.  Choose water, low-fat milk, unsweetened iced tea, or other drinks without added sugar. If you want an alcoholic beverage, choose a lower calorie option such as a glass of wine or light beer.  Ask for dressings, sauces, and syrups on the side. These are usually high in calories, so you should limit the amount you eat.  If you want a salad, choose a garden salad and ask for grilled meats. Avoid extra toppings like bacon, cheese, or fried items. Ask for the dressing on the side, or ask for olive oil and vinegar or lemon to use as  dressing.  Estimate how many servings of a food you are given. For example, a serving of cooked rice is  cup or about the size of half a baseball. Knowing serving sizes will help you be aware of how much food you are eating at restaurants. The list below tells you how big or small some common portion sizes are based on everyday objects: ? 1 oz-4 stacked dice. ? 3 oz-1 deck of cards. ? 1 tsp-1 die. ? 1 Tbsp- a ping-pong ball. ? 2 Tbsp-1 ping-pong ball. ?  cup- baseball. ? 1 cup-1 baseball. Summary  Calorie counting means keeping track of how many calories you eat and drink each day. If you eat fewer calories than your body needs, you should lose weight.  A healthy amount of weight to lose per week is usually 1-2 lb (0.5-0.9 kg). This usually means reducing your daily calorie intake by 500-750 calories.  The number of calories in a food can be found on a  Nutrition Facts label. If a food does not have a Nutrition Facts label, try to look up the calories online or ask your dietitian for help.  Use your calories on foods and drinks that will fill you up, and not on foods and drinks that will leave you hungry.  Use smaller plates, glasses, and bowls to prevent overeating. This information is not intended to replace advice given to you by your health care provider. Make sure you discuss any questions you have with your health care provider. Document Released: 08/31/2005 Document Revised: 07/31/2016 Document Reviewed: 07/31/2016 Elsevier Interactive Patient Education  [employer].

## 2018-06-30 NOTE — Progress Notes (Signed)
Patient: Alexandra Campos Female    DOB: 04/17/1956   62 y.o.   MRN: [id number] Visit Date: 06/30/2018  Today's Provider: Langston Reusing, NP   Chief Complaint  Patient presents with  . Diabetes  . Hypertension  . Arthritis   Subjective:    HPI Ms Bugh 62y/o female presents for f/u on Hypertension, diabetes, Arthritis and mental health clinic appointment with Ms. Heather LCSW. She also wants to establish care since she lost her job. C/o worsening arthritic pain to both hands and right shoulder. She states the right shoulder pain is constant, and the hands feels like pins and needles. States that she does not check BP.   She states that she have being having non productive cough which might be due to lisinopril.  C/o bilateral leg cramps at night and  Constant pain to both legs.states her legs hurt while standing, " can't stand for more than 30 minutes". Has 3 screws to right ankle due to fracture many years ago. 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   She states that her urinary incontinence that has being going on for 10 years a  has worsened. States that she losses urine when she laughes or coughs. No Known Allergies Previous Medications   CHLORTHALIDONE (HYGROTON) 50 MG TABLET    Take by mouth daily.   CYCLOBENZAPRINE (FLEXERIL) 5 MG TABLET    Take 1 tablet (5 mg total) by mouth every 8 (eight) hours as needed for muscle spasms.   GABAPENTIN (NEURONTIN) 300 MG CAPSULE    Take 300 mg by mouth 3 (three) times daily.   HYDROCHLOROTHIAZIDE (HYDRODIURIL) 25 MG TABLET    Take 25 mg by mouth daily.   IBUPROFEN (ADVIL,MOTRIN) 200 MG TABLET    Take 200 mg by mouth every 6 (six) hours as needed.   LISINOPRIL (PRINIVIL,ZESTRIL) 20 MG TABLET    Take 10 mg by mouth daily.    METFORMIN (GLUCOPHAGE) 1000 MG TABLET    Take 1,000 mg by mouth 2 (two) times daily with a meal.   OMEPRAZOLE (PRILOSEC) 20 MG CAPSULE    Take 20 mg by mouth daily.    Review of Systems  Constitutional: Positive for  appetite change.  HENT: Negative.   Eyes: Negative.   Respiratory: Positive for shortness of breath. Negative for chest tightness.   Cardiovascular: Positive for leg swelling.  Gastrointestinal: Negative.   Endocrine: Negative.   Genitourinary: Negative.   Skin: Negative.   Neurological: Positive for numbness.  Psychiatric/Behavioral: Negative.     Social History   Tobacco Use  . Smoking status: Current Every Day Smoker    Packs/day: 1.00    Types: Cigarettes  . Smokeless tobacco: Never Used  . Tobacco comment: knows she needs to quit  Substance Use Topics  . Alcohol use: Yes   Objective:   BP (!) 152/101 (BP Location: Left Arm, Patient Position: Sitting)   Pulse 90   Ht 5' (1.524 m)   Wt 218 lb 11.2 oz (99.2 kg)   SpO2 99%   BMI 42.71 kg/m   Physical Exam  Constitutional: She is oriented to person, place, and time. She appears well-developed and well-nourished.  HENT:  Head: Normocephalic and atraumatic.  Eyes: Pupils are equal, round, and reactive to light. Conjunctivae and EOM are normal.  Neck: Normal range of motion.  Cardiovascular: Normal rate, regular rhythm, normal heart sounds and intact distal pulses.  Pulmonary/Chest: Effort normal and breath sounds normal.  Abdominal: Soft. Bowel sounds are normal.  Musculoskeletal: She exhibits tenderness. She exhibits no edema.       Right shoulder: She exhibits tenderness.       Arms: Neurological: She is alert and oriented to person, place, and time.  Skin: Skin is warm and dry.  Psychiatric: She has a normal mood and affect. Her behavior is normal. Judgment and thought content normal.  tenderness to right thumb with palpation      Assessment & Plan:     1. Hypertension associated with type 2 diabetes mellitus ([place]) Patient started on new BP medicine, encourage to loss weight and low salt diet. - losartan (COZAAR) 50 MG tablet; Take 1 tablet (50 mg total) by mouth daily.  Dispense: 30 tablet; Refill: 0 - CBC  w/Diff; Future - Comp Met (CMET); Future - Lipid Profile; Future - Urinalysis; Future   2 Allergy history unknown  - cetirizine (ZYRTEC) 10 MG tablet; Take 1 tablet (10 mg total) by mouth daily.  Dispense: 30 tablet; Refill: 2  3. Infected dental carries  - Ambulatory referral to Dentistry  4. Stress incontinence of urine Encouraged patient to perform Kegel exercise.  5. Osteoarthritis of multiple joints, unspecified osteoarthritis type  - ibuprofen (ADVIL,MOTRIN) 200 MG tablet; Take 1 tablet (200 mg total) by mouth every 6 (six) hours as needed.  Dispense: 60 tablet; Refill: 2. Orthopedic referral  6. Mood changes Appointment with Mental Health Ms. Heather LCSW  7. Diabetes mellitus without complication ([facility])  - gabapentin (NEURONTIN) 300 MG capsule; Take 1 capsule (300 mg total) by mouth at bedtime.  Dispense: 30 capsule; Refill: 0 - metFORMIN (GLUCOPHAGE) 1000 MG tablet; Take 1 tablet (1,000 mg total) by mouth daily with breakfast.  Dispense: 90 tablet; Refill: 3 - Urinalysis; Future - HgB A1c; Future - Ambulatory referral to Ophthalmology      Langston Reusing, NP   [facility]

## 2018-07-06 ENCOUNTER — Other Ambulatory Visit: Payer: Self-pay

## 2018-07-06 DIAGNOSIS — E1159 Type 2 diabetes mellitus with other circulatory complications: Secondary | ICD-10-CM

## 2018-07-06 DIAGNOSIS — I10 Essential (primary) hypertension: Principal | ICD-10-CM

## 2018-07-06 DIAGNOSIS — I152 Hypertension secondary to endocrine disorders: Secondary | ICD-10-CM

## 2018-07-06 DIAGNOSIS — E119 Type 2 diabetes mellitus without complications: Secondary | ICD-10-CM

## 2018-07-07 ENCOUNTER — Other Ambulatory Visit: Payer: Self-pay

## 2018-07-07 ENCOUNTER — Encounter: Payer: Self-pay | Admitting: Gerontology

## 2018-07-07 ENCOUNTER — Ambulatory Visit: Payer: Self-pay | Admitting: Gerontology

## 2018-07-07 VITALS — BP 157/98 | HR 84 | Temp 97.7°F | Resp 16 | Wt 217.2 lb

## 2018-07-07 DIAGNOSIS — Z0000 Encounter for general adult medical examination without abnormal findings: Secondary | ICD-10-CM

## 2018-07-07 DIAGNOSIS — E119 Type 2 diabetes mellitus without complications: Secondary | ICD-10-CM

## 2018-07-07 DIAGNOSIS — I10 Essential (primary) hypertension: Secondary | ICD-10-CM

## 2018-07-07 DIAGNOSIS — M159 Polyosteoarthritis, unspecified: Secondary | ICD-10-CM

## 2018-07-07 LAB — CBC WITH DIFFERENTIAL/PLATELET
Basophils Absolute: 0.1 10*3/uL (ref 0.0–0.2)
Basos: 2 %
EOS (ABSOLUTE): 0.1 10*3/uL (ref 0.0–0.4)
EOS: 3 %
HEMATOCRIT: 37.8 % (ref 34.0–46.6)
HEMOGLOBIN: 13.1 g/dL (ref 11.1–15.9)
IMMATURE GRANS (ABS): 0 10*3/uL (ref 0.0–0.1)
IMMATURE GRANULOCYTES: 1 %
LYMPHS ABS: 1.7 10*3/uL (ref 0.7–3.1)
Lymphs: 38 %
MCH: 31.5 pg (ref 26.6–33.0)
MCHC: 34.7 g/dL (ref 31.5–35.7)
MCV: 91 fL (ref 79–97)
Monocytes Absolute: 0.4 10*3/uL (ref 0.1–0.9)
Monocytes: 9 %
Neutrophils Absolute: 2.1 10*3/uL (ref 1.4–7.0)
Neutrophils: 47 %
Platelets: 207 10*3/uL (ref 150–450)
RBC: 4.16 x10E6/uL (ref 3.77–5.28)
RDW: 13.4 % (ref 12.3–15.4)
WBC: 4.4 10*3/uL (ref 3.4–10.8)

## 2018-07-07 LAB — COMPREHENSIVE METABOLIC PANEL
A/G RATIO: 1.2 (ref 1.2–2.2)
ALBUMIN: 4.1 g/dL (ref 3.6–4.8)
ALT: 12 IU/L (ref 0–32)
AST: 10 IU/L (ref 0–40)
Alkaline Phosphatase: 76 IU/L (ref 39–117)
BUN/Creatinine Ratio: 16 (ref 12–28)
BUN: 19 mg/dL (ref 8–27)
Bilirubin Total: 0.4 mg/dL (ref 0.0–1.2)
CALCIUM: 9.7 mg/dL (ref 8.7–10.3)
CO2: 24 mmol/L (ref 20–29)
Chloride: 98 mmol/L (ref 96–106)
Creatinine, Ser: 1.18 mg/dL — ABNORMAL HIGH (ref 0.57–1.00)
GFR calc Af Amer: 58 mL/min/{1.73_m2} — ABNORMAL LOW (ref >59)
GFR, EST NON AFRICAN AMERICAN: 50 mL/min/{1.73_m2} — AB (ref >59)
GLOBULIN, TOTAL: 3.4 g/dL (ref 1.5–4.5)
Glucose: 164 mg/dL — ABNORMAL HIGH (ref 65–99)
POTASSIUM: 3.8 mmol/L (ref 3.5–5.2)
SODIUM: 139 mmol/L (ref 134–144)
Total Protein: 7.5 g/dL (ref 6.0–8.5)

## 2018-07-07 LAB — URINALYSIS
BILIRUBIN UA: NEGATIVE
GLUCOSE, UA: NEGATIVE
Ketones, UA: NEGATIVE
Leukocytes, UA: NEGATIVE
NITRITE UA: NEGATIVE
PH UA: 6 (ref 5.0–7.5)
Protein, UA: NEGATIVE
RBC, UA: NEGATIVE
Specific Gravity, UA: 1.016 (ref 1.005–1.030)
UUROB: 0.2 mg/dL (ref 0.2–1.0)

## 2018-07-07 LAB — LIPID PANEL
CHOLESTEROL TOTAL: 190 mg/dL (ref 100–199)
Chol/HDL Ratio: 2.9 ratio (ref 0.0–4.4)
HDL: 66 mg/dL (ref >39)
LDL CALC: 102 mg/dL — AB (ref 0–99)
TRIGLYCERIDES: 112 mg/dL (ref 0–149)
VLDL Cholesterol Cal: 22 mg/dL (ref 5–40)

## 2018-07-07 LAB — HEMOGLOBIN A1C
Est. average glucose Bld gHb Est-mCnc: 163 mg/dL
Hgb A1c MFr Bld: 7.3 % — ABNORMAL HIGH (ref 4.8–5.6)

## 2018-07-07 LAB — TSH: TSH: 1.4 u[IU]/mL (ref 0.450–4.500)

## 2018-07-07 LAB — SPECIMEN STATUS REPORT

## 2018-07-07 MED ORDER — IBUPROFEN 200 MG PO TABS: 400.0000 mg | ORAL_TABLET | Freq: Four times a day (QID) | ORAL | 2 refills | Status: DC | PRN

## 2018-07-07 MED ORDER — CYCLOBENZAPRINE HCL 5 MG PO TABS: 5.0000 mg | ORAL_TABLET | Freq: Three times a day (TID) | ORAL | 0 refills | Status: DC | PRN

## 2018-07-07 NOTE — Patient Instructions (Addendum)
Calorie Counting for Weight Loss Calories are units of energy. Your body needs a certain amount of calories from food to keep you going throughout the day. When you eat more calories than your body needs, your body stores the extra calories as fat. When you eat fewer calories than your body needs, your body burns fat to get the energy it needs. Calorie counting means keeping track of how many calories you eat and drink each day. Calorie counting can be helpful if you need to lose weight. If you make sure to eat fewer calories than your body needs, you should lose weight. Ask your health care provider what a healthy weight is for you. For calorie counting to work, you will need to eat the right number of calories in a day in order to lose a healthy amount of weight per week. A dietitian can help you determine how many calories you need in a day and will give you suggestions on how to reach your calorie goal.  A healthy amount of weight to lose per week is usually 1-2 lb (0.5-0.9 kg). This usually means that your daily calorie intake should be reduced by 500-750 calories.  Eating 1,200 - 1,500 calories per day can help most women lose weight.  Eating 1,500 - 1,800 calories per day can help most men lose weight.  What is my plan? My goal is to have __________ calories per day. If I have this many calories per day, I should lose around __________ pounds per week. What do I need to know about calorie counting? In order to meet your daily calorie goal, you will need to:  Find out how many calories are in each food you would like to eat. Try to do this before you eat.  Decide how much of the food you plan to eat.  Write down what you ate and how many calories it had. Doing this is called keeping a food log.  To successfully lose weight, it is important to balance calorie counting with a healthy lifestyle that includes regular activity. Aim for 150 minutes of moderate exercise (such as walking) or 75  minutes of vigorous exercise (such as running) each week. Where do I find calorie information?  The number of calories in a food can be found on a Nutrition Facts label. If a food does not have a Nutrition Facts label, try to look up the calories online or ask your dietitian for help. Remember that calories are listed per serving. If you choose to have more than one serving of a food, you will have to multiply the calories per serving by the amount of servings you plan to eat. For example, the label on a package of bread might say that a serving size is 1 slice and that there are 90 calories in a serving. If you eat 1 slice, you will have eaten 90 calories. If you eat 2 slices, you will have eaten 180 calories. How do I keep a food log? Immediately after each meal, record the following information in your food log:  What you ate. Don't forget to include toppings, sauces, and other extras on the food.  How much you ate. This can be measured in cups, ounces, or number of items.  How many calories each food and drink had.  The total number of calories in the meal.  Keep your food log near you, such as in a small notebook in your pocket, or use a mobile app or website. Some   programs will calculate calories for you and show you how many calories you have left for the day to meet your goal. What are some calorie counting tips?  Use your calories on foods and drinks that will fill you up and not leave you hungry: ? Some examples of foods that fill you up are nuts and nut butters, vegetables, lean proteins, and high-fiber foods like whole grains. High-fiber foods are foods with more than 5 g fiber per serving. ? Drinks such as sodas, specialty coffee drinks, alcohol, and juices have a lot of calories, yet do not fill you up.  Eat nutritious foods and avoid empty calories. Empty calories are calories you get from foods or beverages that do not have many vitamins or protein, such as candy, sweets, and  soda. It is better to have a nutritious high-calorie food (such as an avocado) than a food with few nutrients (such as a bag of chips).  Know how many calories are in the foods you eat most often. This will help you calculate calorie counts faster.  Pay attention to calories in drinks. Low-calorie drinks include water and unsweetened drinks.  Pay attention to nutrition labels for "low fat" or "fat free" foods. These foods sometimes have the same amount of calories or more calories than the full fat versions. They also often have added sugar, starch, or salt, to make up for flavor that was removed with the fat.  Find a way of tracking calories that works for you. Get creative. Try different apps or programs if writing down calories does not work for you. What are some portion control tips?  Know how many calories are in a serving. This will help you know how many servings of a certain food you can have.  Use a measuring cup to measure serving sizes. You could also try weighing out portions on a kitchen scale. With time, you will be able to estimate serving sizes for some foods.  Take some time to put servings of different foods on your favorite plates, bowls, and cups so you know what a serving looks like.  Try not to eat straight from a bag or box. Doing this can lead to overeating. Put the amount you would like to eat in a cup or on a plate to make sure you are eating the right portion.  Use smaller plates, glasses, and bowls to prevent overeating.  Try not to multitask (for example, watch TV or use your computer) while eating. If it is time to eat, sit down at a table and enjoy your food. This will help you to know when you are full. It will also help you to be aware of what you are eating and how much you are eating. What are tips for following this plan? Reading food labels  Check the calorie count compared to the serving size. The serving size may be smaller than what you are used to  eating.  Check the source of the calories. Make sure the food you are eating is high in vitamins and protein and low in saturated and trans fats. Shopping  Read nutrition labels while you shop. This will help you make healthy decisions before you decide to purchase your food.  Make a grocery list and stick to it. Cooking  Try to cook your favorite foods in a healthier way. For example, try baking instead of frying.  Use low-fat dairy products. Meal planning  Use more fruits and vegetables. Half of your plate should   be fruits and vegetables.  Include lean proteins like poultry and fish. How do I count calories when eating out?  Ask for smaller portion sizes.  Consider sharing an entree and sides instead of getting your own entree.  If you get your own entree, eat only half. Ask for a box at the beginning of your meal and put the rest of your entree in it so you are not tempted to eat it.  If calories are listed on the menu, choose the lower calorie options.  Choose dishes that include vegetables, fruits, whole grains, low-fat dairy products, and lean protein.  Choose items that are boiled, broiled, grilled, or steamed. Stay away from items that are buttered, battered, fried, or served with cream sauce. Items labeled "crispy" are usually fried, unless stated otherwise.  Choose water, low-fat milk, unsweetened iced tea, or other drinks without added sugar. If you want an alcoholic beverage, choose a lower calorie option such as a glass of wine or light beer.  Ask for dressings, sauces, and syrups on the side. These are usually high in calories, so you should limit the amount you eat.  If you want a salad, choose a garden salad and ask for grilled meats. Avoid extra toppings like bacon, cheese, or fried items. Ask for the dressing on the side, or ask for olive oil and vinegar or lemon to use as dressing.  Estimate how many servings of a food you are given. For example, a serving of  cooked rice is  cup or about the size of half a baseball. Knowing serving sizes will help you be aware of how much food you are eating at restaurants. The list below tells you how big or small some common portion sizes are based on everyday objects: ? 1 oz-4 stacked dice. ? 3 oz-1 deck of cards. ? 1 tsp-1 die. ? 1 Tbsp- a ping-pong ball. ? 2 Tbsp-1 ping-pong ball. ?  cup- baseball. ? 1 cup-1 baseball. Summary  Calorie counting means keeping track of how many calories you eat and drink each day. If you eat fewer calories than your body needs, you should lose weight.  A healthy amount of weight to lose per week is usually 1-2 lb (0.5-0.9 kg). This usually means reducing your daily calorie intake by 500-750 calories.  The number of calories in a food can be found on a Nutrition Facts label. If a food does not have a Nutrition Facts label, try to look up the calories online or ask your dietitian for help.  Use your calories on foods and drinks that will fill you up, and not on foods and drinks that will leave you hungry.  Use smaller plates, glasses, and bowls to prevent overeating. This information is not intended to replace advice given to you by your health care provider. Make sure you discuss any questions you have with your health care provider. Document Released: 08/31/2005 Document Revised: 07/31/2016 Document Reviewed: 07/31/2016 Elsevier Interactive Patient Education  2018 [employer]. Carbohydrate Counting for Diabetes Mellitus, Adult Carbohydrate counting is a method for keeping track of how many carbohydrates you eat. Eating carbohydrates naturally increases the amount of sugar (glucose) in the blood. Counting how many carbohydrates you eat helps keep your blood glucose within normal limits, which helps you manage your diabetes (diabetes mellitus). It is important to know how many carbohydrates you can safely have in each meal. This is different for every person. A diet and  nutrition specialist (registered dietitian) can help you  make a meal plan and calculate how many carbohydrates you should have at each meal and snack. Carbohydrates are found in the following foods:  Grains, such as breads and cereals.  Dried beans and soy products.  Starchy vegetables, such as potatoes, peas, and corn.  Fruit and fruit juices.  Milk and yogurt.  Sweets and snack foods, such as cake, cookies, candy, chips, and soft drinks.  How do I count carbohydrates? There are two ways to count carbohydrates in food. You can use either of the methods or a combination of both. Reading "Nutrition Facts" on packaged food The "Nutrition Facts" list is included on the labels of almost all packaged foods and beverages in the [place] It includes:  The serving size.  Information about nutrients in each serving, including the grams (g) of carbohydrate per serving.  To use the "Nutrition Facts":  Decide how many servings you will have.  Multiply the number of servings by the number of carbohydrates per serving.  The resulting number is the total amount of carbohydrates that you will be having.  Learning standard serving sizes of other foods When you eat foods containing carbohydrates that are not packaged or do not include "Nutrition Facts" on the label, you need to measure the servings in order to count the amount of carbohydrates:  Measure the foods that you will eat with a food scale or measuring cup, if needed.  Decide how many standard-size servings you will eat.  Multiply the number of servings by 15. Most carbohydrate-rich foods have about 15 g of carbohydrates per serving. ? For example, if you eat 8 oz (170 g) of strawberries, you will have eaten 2 servings and 30 g of carbohydrates (2 servings x 15 g = 30 g).  For foods that have more than one food mixed, such as soups and casseroles, you must count the carbohydrates in each food that is included.  The following list  contains standard serving sizes of common carbohydrate-rich foods. Each of these servings has about 15 g of carbohydrates:   hamburger bun or  English muffin.   oz (15 mL) syrup.   oz (14 g) jelly.  1 slice of bread.  1 six-inch tortilla.  3 oz (85 g) cooked rice or pasta.  4 oz (113 g) cooked dried beans.  4 oz (113 g) starchy vegetable, such as peas, corn, or potatoes.  4 oz (113 g) hot cereal.  4 oz (113 g) mashed potatoes or  of a large baked potato.  4 oz (113 g) canned or frozen fruit.  4 oz (120 mL) fruit juice.  4-6 crackers.  6 chicken nuggets.  6 oz (170 g) unsweetened dry cereal.  6 oz (170 g) plain fat-free yogurt or yogurt sweetened with artificial sweeteners.  8 oz (240 mL) milk.  8 oz (170 g) fresh fruit or one small piece of fruit.  24 oz (680 g) popped popcorn.  Example of carbohydrate counting Sample meal  3 oz (85 g) chicken breast.  6 oz (170 g) brown rice.  4 oz (113 g) corn.  8 oz (240 mL) milk.  8 oz (170 g) strawberries with sugar-free whipped topping. Carbohydrate calculation 1. Identify the foods that contain carbohydrates: ? Rice. ? Corn. ? Milk. ? Strawberries. 2. Calculate how many servings you have of each food: ? 2 servings rice. ? 1 serving corn. ? 1 serving milk. ? 1 serving strawberries. 3. Multiply each number of servings by 15 g: ? 2 servings rice  x 15 g = 30 g. ? 1 serving corn x 15 g = 15 g. ? 1 serving milk x 15 g = 15 g. ? 1 serving strawberries x 15 g = 15 g. 4. Add together all of the amounts to find the total grams of carbohydrates eaten: ? 30 g + 15 g + 15 g + 15 g = 75 g of carbohydrates total. This information is not intended to replace advice given to you by your health care provider. Make sure you discuss any questions you have with your health care provider. Document Released: 08/31/2005 Document Revised: 03/20/2016 Document Reviewed: 02/12/2016 Elsevier Interactive Patient Education  2018  [employer].  Fat and Cholesterol Restricted Diet Getting too much fat and cholesterol in your diet may cause health problems. Following this diet helps keep your fat and cholesterol at normal levels. This can keep you from getting sick. What types of fat should I choose?  Choose monosaturated and polyunsaturated fats. These are found in foods such as olive oil, canola oil, flaxseeds, walnuts, almonds, and seeds.  Eat more omega-3 fats. Good choices include salmon, mackerel, sardines, tuna, flaxseed oil, and ground flaxseeds.  Limit saturated fats. These are in animal products such as meats, butter, and cream. They can also be in plant products such as palm oil, palm kernel oil, and coconut oil.  Avoid foods with partially hydrogenated oils in them. These contain trans fats. Examples of foods that have trans fats are stick margarine, some tub margarines, cookies, crackers, and other baked goods. What general guidelines do I need to follow?  Check food labels. Look for the words "trans fat" and "saturated fat."  When preparing a meal: ? Fill half of your plate with vegetables and green salads. ? Fill one fourth of your plate with whole grains. Look for the word "whole" as the first word in the ingredient list. ? Fill one fourth of your plate with lean protein foods.  Eat more foods that have fiber, like apples, carrots, beans, peas, and barley.  Eat more home-cooked foods. Eat less at restaurants and buffets.  Limit or avoid alcohol.  Limit foods high in starch and sugar.  Limit fried foods.  Cook foods without frying them. Baking, boiling, grilling, and broiling are all great options.  Lose weight if you are overweight. Losing even a small amount of weight can help your overall health. It can also help prevent diseases such as diabetes and heart disease. What foods can I eat? Grains Whole grains, such as whole wheat or whole grain breads, crackers, cereals, and pasta.  Unsweetened oatmeal, bulgur, barley, quinoa, or brown rice. Corn or whole wheat flour tortillas. Vegetables Fresh or frozen vegetables (raw, steamed, roasted, or grilled). Green salads. Fruits All fresh, canned (in natural juice), or frozen fruits. Meat and Other Protein Products Ground beef (85% or leaner), grass-fed beef, or beef trimmed of fat. Skinless chicken or [place]. Ground chicken or [place]. Pork trimmed of fat. All fish and seafood. Eggs. Dried beans, peas, or lentils. Unsalted nuts or seeds. Unsalted canned or dry beans. Dairy Low-fat dairy products, such as skim or 1% milk, 2% or reduced-fat cheeses, low-fat ricotta or cottage cheese, or plain low-fat yogurt. Fats and Oils Tub margarines without trans fats. Light or reduced-fat mayonnaise and salad dressings. Avocado. Olive, canola, sesame, or safflower oils. Natural peanut or almond butter (choose ones without added sugar and oil). The items listed above may not be a complete list of recommended foods or beverages.  Contact your dietitian for more options. What foods are not recommended? Grains White bread. White pasta. White rice. Cornbread. Bagels, pastries, and croissants. Crackers that contain trans fat. Vegetables White potatoes. Corn. Creamed or fried vegetables. Vegetables in a cheese sauce. Fruits Dried fruits. Canned fruit in light or heavy syrup. Fruit juice. Meat and Other Protein Products Fatty cuts of meat. Ribs, chicken wings, bacon, sausage, bologna, salami, chitterlings, fatback, hot dogs, bratwurst, and packaged luncheon meats. Liver and organ meats. Dairy Whole or 2% milk, cream, half-and-half, and cream cheese. Whole milk cheeses. Whole-fat or sweetened yogurt. Full-fat cheeses. Nondairy creamers and whipped toppings. Processed cheese, cheese spreads, or cheese curds. Sweets and Desserts Corn syrup, sugars, honey, and molasses. Candy. Jam and jelly. Syrup. Sweetened cereals. Cookies, pies, cakes, donuts,  muffins, and ice cream. Fats and Oils Butter, stick margarine, lard, shortening, ghee, or bacon fat. Coconut, palm kernel, or palm oils. Beverages Alcohol. Sweetened drinks (such as sodas, lemonade, and fruit drinks or punches). The items listed above may not be a complete list of foods and beverages to avoid. Contact your dietitian for more information. This information is not intended to replace advice given to you by your health care provider. Make sure you discuss any questions you have with your health care provider. Document Released: 03/01/2012 Document Revised: 05/07/2016 Document Reviewed: 11/30/2013 Elsevier Interactive Patient Education  [employer].

## 2018-07-07 NOTE — Progress Notes (Signed)
Patient: Alexandra Campos Female    DOB: 1956-03-05   62 y.o.   MRN: [id number] Visit Date: 07/07/2018  Today's Provider: Langston Reusing, NP   Chief Complaint  Patient presents with  . Follow-up    review labs   Subjective:    HPI Patients presents for follow up on hypertension, diabetes, arthritis pain. BP is 157/98 stated started 50 mg Losartan and 50 mg Chlortalidon on Tuesday. Ordered BP monitor.Denies chest pain, palpitation, shortness of breath. Continues to smoke 1 pack a day, expresses desire to quit, states bought nicoret lozenges.  C/o having suicidal thoughts, but has no plan, might be due to Gabapentin, she stopped taking medicine and wants medication discontinued. She states will follow up with Mental health Ms. Julian Hy.  She states that  Ibuprofin 200 mg dose is not relieving pain in her right hand, and equally need  Flexeril refilled. She will schedule appointment with Dr Fransisca Kaufmann.  She states that she has not being taking Metformin as prescribed, promised to start today, hgbA1c was 7.3 and will monitor her diet and  Exercise regularly .      No Known Allergies Previous Medications   CETIRIZINE (ZYRTEC) 10 MG TABLET    Take 1 tablet (10 mg total) by mouth daily.   CHLORTHALIDONE (HYGROTON) 50 MG TABLET    Take by mouth daily.   CYCLOBENZAPRINE (FLEXERIL) 5 MG TABLET    Take 1 tablet (5 mg total) by mouth every 8 (eight) hours as needed for muscle spasms.   GABAPENTIN (NEURONTIN) 300 MG CAPSULE    Take 1 capsule (300 mg total) by mouth at bedtime.   IBUPROFEN (ADVIL,MOTRIN) 200 MG TABLET    Take 1 tablet (200 mg total) by mouth every 6 (six) hours as needed.   LOSARTAN (COZAAR) 50 MG TABLET    Take 1 tablet (50 mg total) by mouth daily.   METFORMIN (GLUCOPHAGE) 1000 MG TABLET    Take 1 tablet (1,000 mg total) by mouth daily with breakfast.   OMEPRAZOLE (PRILOSEC) 20 MG CAPSULE    Take 20 mg by mouth daily.    Review of Systems  Constitutional: Negative.    HENT: Negative.   Eyes: Negative.   Respiratory: Negative.   Cardiovascular: Negative.   Genitourinary: Negative.   Musculoskeletal: Positive for arthralgias.  Skin: Negative.   Neurological: Negative.   Psychiatric/Behavioral: Negative.     Social History   Tobacco Use  . Smoking status: Current Every Day Smoker    Packs/day: 1.00    Types: Cigarettes  . Smokeless tobacco: Never Used  . Tobacco comment: knows she needs to quit  Substance Use Topics  . Alcohol use: Yes   Objective:   BP (!) 157/98 (BP Location: Right Arm, Patient Position: Sitting)   Pulse 84   Temp 97.7 F (36.5 C)   Resp 16   Wt 217 lb 3.2 oz (98.5 kg)   SpO2 98%   BMI 42.42 kg/m   Physical Exam  Constitutional: She is oriented to person, place, and time. She appears well-developed and well-nourished.  HENT:  Head: Normocephalic and atraumatic.  Eyes: Pupils are equal, round, and reactive to light. EOM are normal.  Neck: Normal range of motion. Neck supple.  Cardiovascular: Normal rate and regular rhythm.  Pulmonary/Chest: Effort normal and breath sounds normal.  Abdominal: Soft. Bowel sounds are normal.  Musculoskeletal: Normal range of motion. She exhibits tenderness.  Neurological: She is alert and oriented to person, place, and time.  Skin:  Skin is warm and dry.  Psychiatric: She has a normal mood and affect. Her behavior is normal. Judgment and thought content normal.  right hand, has brace on      Assessment & Plan:        Diabetes mellitus without complication (HCC)  Osteoarthritis of multiple joints, unspecified osteoarthritis type  Health care maintenance - Plan: Flu Vaccine QUAD 6+ mos PF IM (Fluarix Quad PF)  Essential Hypertension: Continue 50 mg Losartan and 50 mg Chlorthalidone, Monitor BP and keep log, bring to next appointment. Continue on DASH diet, weight loss regimen.  Diabetes Mellitus without Complication [id number] was 7.3, Diabetes uncontrolled, non compliant with  metformin. Agreed to take metformin as prescribed, continue on calorie count diet and exercise. Recheck HgbA1c in 3 months.  Osteoarthritis of Multiple joints: Continue to take 400 mg Ibuprofen every 6 hours as needed and Cyclobenzaprine 5 mg every 8 hours a needed . Ortho referral with Dr Fransisca Kaufmann.Gabapentin was discontinued.  Health care Maintenance; Eye check exam, Flu vaccine administered today, Creatinine 1.18, advised to increase fluid consumption, will recheck cMet in 4 weeks. Mental health appointment with Ms Julian Hy. Langston Reusing, NP   [facility]

## 2018-07-08 ENCOUNTER — Ambulatory Visit: Payer: Self-pay | Admitting: Pharmacy Technician

## 2018-07-08 DIAGNOSIS — Z79899 Other long term (current) drug therapy: Secondary | ICD-10-CM

## 2018-07-08 NOTE — Progress Notes (Signed)
Completed [facility] application and contract.  Patient agreed to all terms of the [facility] contract.    Patient approved to receive medication assistance at [facility] as long as eligibility criteria continues to be met..    Provided patient with community resource material based on her particular needs.    Alexandra Campos Care Manager [facility]  

## 2018-07-13 ENCOUNTER — Ambulatory Visit: Payer: Self-pay | Admitting: Licensed Clinical Social Worker

## 2018-07-14 ENCOUNTER — Other Ambulatory Visit: Payer: Self-pay

## 2018-07-14 MED ORDER — CHLORTHALIDONE 50 MG PO TABS: 50.0000 mg | ORAL_TABLET | Freq: Every day | ORAL | 2 refills | Status: DC

## 2018-07-20 ENCOUNTER — Ambulatory Visit: Payer: Self-pay | Admitting: Specialist

## 2018-07-20 DIAGNOSIS — M159 Polyosteoarthritis, unspecified: Secondary | ICD-10-CM

## 2018-07-20 NOTE — Progress Notes (Signed)
   Subjective:    Patient ID: Alexandra Campos, female    DOB: 1955-10-03, 62 y.o.   MRN: [id number]  HPI 62 yr old RHD has multiple year hx of "tendenitiis". She has been wearing a brace that helps at night. She is a one a day PPD smoker. She tells [place] she has neuropathy of her feet (probably diabetic). She has a hx of a trimalleolar fracture of Rt ankle. She has also had a fracture of her Lt knee. She stopped working in June of this year and is trying to get disability.    Review of Systems Deferred    Objective:   Physical Exam  She has a FROM of shoulders and elbows, left wrist, and fingers. On her Rt side, she lacks 30 Degrees of ext of the wrist otherwise FROM. There are no trophic changes of the skin or nails. Finkelstein's test neg. She is tender about the MP joint of the Rt thumb. She had a neg Tinel's sign over the carpel tunnel.  Assessment & Plan:  Plan: Xray Rt wrist and hand. RTC after X rays. I have advised her to stop taking flexeril.

## 2018-07-25 ENCOUNTER — Ambulatory Visit
Admission: RE | Admit: 2018-07-25 | Discharge: 2018-07-25 | Disposition: A | Discharge status: Home / Self Care | Payer: Self-pay | Source: Ambulatory Visit | Attending: Specialist | Admitting: Specialist

## 2018-07-25 ENCOUNTER — Other Ambulatory Visit: Payer: Self-pay | Admitting: Specialist

## 2018-07-25 DIAGNOSIS — M159 Polyosteoarthritis, unspecified: Secondary | ICD-10-CM

## 2018-07-28 ENCOUNTER — Ambulatory Visit: Payer: Self-pay | Admitting: Licensed Clinical Social Worker

## 2018-07-28 ENCOUNTER — Other Ambulatory Visit: Payer: Self-pay

## 2018-07-28 DIAGNOSIS — F411 Generalized anxiety disorder: Secondary | ICD-10-CM

## 2018-07-28 DIAGNOSIS — F4310 Post-traumatic stress disorder, unspecified: Secondary | ICD-10-CM

## 2018-07-28 DIAGNOSIS — E119 Type 2 diabetes mellitus without complications: Secondary | ICD-10-CM

## 2018-07-28 DIAGNOSIS — F332 Major depressive disorder, recurrent severe without psychotic features: Secondary | ICD-10-CM
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
## 2018-07-29 LAB — MICROALBUMIN / CREATININE URINE RATIO
CREATININE, UR: 180.8 mg/dL
Microalb/Creat Ratio: 4.2 mg/g creat (ref 0.0–30.0)
Microalbumin, Urine: 7.6 ug/mL

## 2018-07-29 LAB — URINALYSIS
BILIRUBIN UA: NEGATIVE
GLUCOSE, UA: NEGATIVE
Ketones, UA: NEGATIVE
Leukocytes, UA: NEGATIVE
Nitrite, UA: NEGATIVE
PH UA: 5.5 (ref 5.0–7.5)
PROTEIN UA: NEGATIVE
RBC UA: NEGATIVE
SPEC GRAV UA: 1.022 (ref 1.005–1.030)
UUROB: 0.2 mg/dL (ref 0.2–1.0)

## 2018-07-29 LAB — COMPREHENSIVE METABOLIC PANEL
ALK PHOS: 77 IU/L (ref 39–117)
ALT: 15 IU/L (ref 0–32)
AST: 12 IU/L (ref 0–40)
Albumin/Globulin Ratio: 1.3 (ref 1.2–2.2)
Albumin: 3.9 g/dL (ref 3.6–4.8)
BILIRUBIN TOTAL: 0.4 mg/dL (ref 0.0–1.2)
BUN/Creatinine Ratio: 18 (ref 12–28)
BUN: 20 mg/dL (ref 8–27)
CHLORIDE: 102 mmol/L (ref 96–106)
CO2: 23 mmol/L (ref 20–29)
CREATININE: 1.12 mg/dL — AB (ref 0.57–1.00)
Calcium: 9.9 mg/dL (ref 8.7–10.3)
GFR calc Af Amer: 62 mL/min/{1.73_m2} (ref >59)
GFR calc non Af Amer: 54 mL/min/{1.73_m2} — ABNORMAL LOW (ref >59)
GLUCOSE: 98 mg/dL (ref 65–99)
Globulin, Total: 3 g/dL (ref 1.5–4.5)
Potassium: 3.5 mmol/L (ref 3.5–5.2)
Sodium: 140 mmol/L (ref 134–144)
TOTAL PROTEIN: 6.9 g/dL (ref 6.0–8.5)

## 2018-07-29 LAB — LIPID PANEL
CHOLESTEROL TOTAL: 186 mg/dL (ref 100–199)
Chol/HDL Ratio: 2.7 ratio (ref 0.0–4.4)
HDL: 69 mg/dL (ref >39)
LDL Calculated: 85 mg/dL (ref 0–99)
Triglycerides: 158 mg/dL — ABNORMAL HIGH (ref 0–149)
VLDL CHOLESTEROL CAL: 32 mg/dL (ref 5–40)

## 2018-08-03 ENCOUNTER — Other Ambulatory Visit: Payer: Self-pay

## 2018-08-03 ENCOUNTER — Encounter: Payer: Self-pay | Admitting: Pharmacist

## 2018-08-03 DIAGNOSIS — E1159 Type 2 diabetes mellitus with other circulatory complications: Secondary | ICD-10-CM

## 2018-08-03 DIAGNOSIS — I10 Essential (primary) hypertension: Principal | ICD-10-CM

## 2018-08-03 MED ORDER — LOSARTAN POTASSIUM 50 MG PO TABS: 50.0000 mg | ORAL_TABLET | Freq: Every day | ORAL | 3 refills | Status: DC

## 2018-08-04 ENCOUNTER — Ambulatory Visit: Payer: Self-pay | Admitting: Adult Health Nurse Practitioner

## 2018-08-04 DIAGNOSIS — E119 Type 2 diabetes mellitus without complications: Secondary | ICD-10-CM

## 2018-08-04 DIAGNOSIS — F17200 Nicotine dependence, unspecified, uncomplicated: Secondary | ICD-10-CM | POA: Insufficient documentation

## 2018-08-04 DIAGNOSIS — Z720 Tobacco use: Secondary | ICD-10-CM | POA: Insufficient documentation

## 2018-08-04 DIAGNOSIS — I10 Essential (primary) hypertension: Secondary | ICD-10-CM

## 2018-08-04 DIAGNOSIS — E1159 Type 2 diabetes mellitus with other circulatory complications: Secondary | ICD-10-CM | POA: Insufficient documentation

## 2018-08-04 MED ORDER — NICOTINE 7 MG/24HR TD PT24: 7.0000 mg | MEDICATED_PATCH | Freq: Every day | TRANSDERMAL | 0 refills | Status: DC
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
## 2018-08-17 ENCOUNTER — Encounter (INDEPENDENT_AMBULATORY_CARE_PROVIDER_SITE_OTHER): Payer: Self-pay

## 2018-08-17 ENCOUNTER — Encounter: Payer: Self-pay | Admitting: Pharmacist

## 2018-08-17 ENCOUNTER — Other Ambulatory Visit: Payer: Self-pay

## 2018-08-17 ENCOUNTER — Ambulatory Visit: Payer: Self-pay | Admitting: Pharmacist

## 2018-08-17 VITALS — BP 142/90 | Ht 60.0 in | Wt 219.0 lb

## 2018-08-17 DIAGNOSIS — Z79899 Other long term (current) drug therapy: Secondary | ICD-10-CM

## 2018-08-17 NOTE — Progress Notes (Addendum)
[facility] Management [facility] Visit Note  Patient: Alexandra Campos MRN: [id number] Date of Birth: 10-Aug-1956 PCP: System, Pcp Not In   Alexandra Campos 62 y.o. female presents for an initial MTM visit today. Pt reports no signs/symptoms of being sick and no contact with others that are sick. Pt has not traveled outside of the country in the past month.  BP (!) 142/90 (BP Location: Right Arm, Patient Position: Sitting, Cuff Size: Large)   Ht 5' (1.524 m)   Wt 219 lb (99.3 kg)   BMI 42.77 kg/m   Patient Information   Past Medical History:  Diagnosis Date  . Arthritis   . Diabetes mellitus without complication ([place])   . Hypertension       Past Surgical History:  Procedure Laterality Date  . ANKLE SURGERY Right   . INDUCED ABORTION    . LYMPHADENECTOMY       Family History  Problem Relation Age of Onset  . Diabetes Mother   . Hypertension Mother   . Diabetes Father   . Hypertension Father   . Cancer Father   . Diabetes Brother   . Hypertension Brother   . Diabetes Daughter   . Hypertension Daughter   . Cancer Paternal Uncle   . Diabetes Maternal Grandmother   . Hypertension Maternal Grandmother     New Diagnoses (since last visit):   Family Support: Good  Lifestyle Diet: Breakfast: grits and bacon or bowl of cereal  Lunch: honeydew melons or sesame sticks or chicken salad Dinner: potatoes or pasta; likes broccoli, green beans, Drinks: mainly drinks water; sometimes does diet cranberry juice; alcohol on the weekends  Exercise: sitting exercises while watching tv, tries to do them everyday; weather has been too cold for her to start walking           Social History   Substance and Sexual Activity  Alcohol Use Yes  . Alcohol/week: 6.0 standard drinks  . Types: 6 Cans of beer per week   Comment: On the weekends/socially      Social History   Tobacco Use  Smoking Status Current Every Day Smoker  . Packs/day: 1.00  . Types: Cigarettes  Smokeless  Tobacco Never Used  Tobacco Comment   knows she needs to quit      Health Maintenance  Topic Date Due  . Hepatitis C Screening  06-20-1956  . PNEUMOCOCCAL POLYSACCHARIDE VACCINE AGE 35-64 HIGH RISK  10/17/1959  . FOOT EXAM  10/17/1967  . OPHTHALMOLOGY EXAM  10/17/1967  . HIV Screening  10/16/1972  . TETANUS/TDAP  10/16/1976  . PAP SMEAR  10/16/1978  . MAMMOGRAM  10/17/2007  . COLONOSCOPY  10/17/2007  . HEMOGLOBIN A1C  01/05/2019  . INFLUENZA VACCINE  Completed   Outpatient Encounter Medications as of 08/17/2018  Medication Sig  . cetirizine (ZYRTEC) 10 MG tablet Take 1 tablet (10 mg total) by mouth daily.  . chlorthalidone (HYGROTON) 50 MG tablet Take 50 mg by mouth daily.  Marland Kitchen ibuprofen (ADVIL,MOTRIN) 200 MG tablet Take 2 tablets (400 mg total) by mouth every 6 (six) hours as needed. (Patient taking differently: Take 400 mg by mouth 2 (two) times daily. )  . losartan (COZAAR) 50 MG tablet Take 1 tablet (50 mg total) by mouth daily.  . metFORMIN (GLUCOPHAGE) 1000 MG tablet Take 1 tablet (1,000 mg total) by mouth daily with breakfast. (Patient taking differently: Take 1,000 mg by mouth 2 (two) times daily with a meal. )  . omeprazole (PRILOSEC) 20 MG capsule  Take 20 mg by mouth daily.  . chlorthalidone (HYGROTON) 50 MG tablet Take 1 tablet (50 mg total) by mouth daily.  . nicotine (NICODERM CQ - DOSED IN MG/24 HR) 7 mg/24hr patch Place 1 patch (7 mg total) onto the skin daily. (Patient not taking: Reported on 08/17/2018)   No facility-administered encounter medications on file as of 08/17/2018.    Health Maintenance/Date Completed  Last ED visit: June 2019 Last Visit to PCP: 2 weeks ago Next Visit to PCP: March 2020 Specialist Visit: mental health visit tomorrow Dental Exam: April 2019 Eye Exam: has appointment tomorrow Prostate Exam: na Pelvic/PAP Exam: several years Mammogram: several years DEXA: na Colonoscopy: na Flu Vaccine: 2 weeks Pneumonia Vaccine: last  year  Assessment and Plan: Adherence/Compliance: Pt reports never missing any doses of medications unless she is doing something out of her normal routine which is rare. Pt keeps medications together in bathroom and takes everything in the morning.  DM: metformin 1000mg  Pt recently started back taking her metformin twice daily. Was only doing once daily when she was working and more active because her blood sugar was lower then. Discussed signs/symptoms of hypoglycemia and what to do when those occur (drink OJ, soft drink, hard candy, etc). BG: on 07-06-18 was 164; pt checks every 3 days or so,usually around 140 A1c: on 07-06-18 was 7.3%  HTN: losartan 50 mg, chlorthalidone 50mg  Pt recently started chlorthalidone. Today was 142/90, was 144/88 on 08-04-18. Pt checks BP at home every few days, states it is usually around 135-140/80-90. Discussed goal of <140/90.   Arthritis: ibuprofen 400mg  Pt takes ibuprofen 2 times daily everyday. Does not feel that her pain is well managed but did not like the side effects of previous medications she has tried (gabapentin, cyclobenzaprine). Pt does sitting exercises so her joints don't get stiff but does not feel that she can walk to exercise. Advised patient to discuss this with physician at next appointment.   Smoking cessation: Pt is interested in quitting but cannot afford nicotine patches/logenzes/gum. Pt filled out [place] QuitNow form - faxing. Pt said she would also be interested and willing to try the nicotine inhaler that we are able to provide here - sending in basket message to provider on this.   Return to clinic in 3 months to follow up on smoking cessation.  Hinton Dyer, PharmD [facility]   Cosigned: Cleopatra Cedar. Dicky Doe, PharmD [facility] [place] Operations Coordinator [phone]

## 2018-08-18 ENCOUNTER — Other Ambulatory Visit: Payer: Self-pay | Admitting: Ophthalmology

## 2018-08-18 ENCOUNTER — Ambulatory Visit: Payer: Self-pay | Admitting: Ophthalmology

## 2018-08-18 ENCOUNTER — Ambulatory Visit: Payer: Self-pay | Admitting: Licensed Clinical Social Worker

## 2018-08-18 DIAGNOSIS — F411 Generalized anxiety disorder: Secondary | ICD-10-CM

## 2018-08-18 DIAGNOSIS — F332 Major depressive disorder, recurrent severe without psychotic features: Secondary | ICD-10-CM

## 2018-08-18 DIAGNOSIS — F4310 Post-traumatic stress disorder, unspecified: Secondary | ICD-10-CM

## 2018-08-18 LAB — HM DIABETES EYE EXAM

## 2018-08-18 NOTE — BH Specialist Note (Signed)
Integrated Behavioral Health Follow Up Visit  MRN: [id number] Name: Alexandra Campos  Number of [place] Clinician visits: 1/6  Type of Service: [place] Interpretor:No. Interpretor Name and Language: Not applicable  SUBJECTIVE: Alexandra Campos is a 62 y.o. female accompanied by herself. Patient was referred by Benjamine Mola NP in clinic for mental health. Patient reports the following symptoms/concerns: She reports that she has been doing okay and had a good Thanksgiving. She explains that she had dinner with her mom at her nieces's house. She notes that things are as good as they can be. She reports that she took the initiative to call 1 800 QUIT NOW to get materials and patches to quit smoking. She explains that she knows she needs to work on applying for disability and isn't sure how to go about it. She notes that she is mainly worrying about her son. She explains that she is easily annoyed and irritable over little things. She denies suicidal and homicidal thoughts.  Duration of problem: ; Severity of problem: moderate  OBJECTIVE: Mood: Euthymic and Affect: Appropriate Risk of harm to self or others: No plan to harm self or others  LIFE CONTEXT: Family and Social: Alexandra Campos reports that she spent Thanksgiving with her family in [place]. School/Work: Alexandra Campos plans to apply for disability. Self-Care: Alexandra Campos is trying to focus on the positive rather than allowing little things to bother her. Life Changes: Alexandra Campos lost her job in the last year.  GOALS ADDRESSED: Patient will: 1.  Reduce symptoms of: anxiety  2.  Increase knowledge and/or ability of: self-management skills  3.  Demonstrate ability to: Increase healthy adjustment to current life circumstances  INTERVENTIONS: Interventions utilized:  Brief CBT was utilized by the clinician focusing on her anxiety and affect on normal cognition. Clinician processed  with the patient regarding how her Thanksgiving went. Clinician explained that in terms of quitting smoking that she could start by gradually decreasing the [place] of cigarettes from 2 to 4 to 6, etc. Clinician provided the client with a disability application checklist. Clinician explained that if she applied before and was denied that she may have to get an attorney. Clinician explained that she should try to focus on what she can control and think more positively. Clinician explained that positive thinking can improve overall health and mental health outcomes.  Standardized Assessments completed: GAD-7 and PHQ 9  Anxiety decreased from a 16 to a 12 and depression from a 15 to an 11.   ASSESSMENT: Patient currently experiencing symptoms of anxiety exacerbated by stress over finances, her health, and family.   Patient may benefit from continuing with therapy.  PLAN: 1. Follow up with behavioral health clinician on : one month or earlier if needed.  2. Behavioral recommendations: Provided with informational checklist for applying for disability. 3. Referral(s): [place] (In Clinic) 4. "From scale of 1-10, how likely are you to follow plan?": [place], LCSW

## 2018-08-31 ENCOUNTER — Other Ambulatory Visit: Payer: Self-pay

## 2018-08-31 MED ORDER — NICOTINE 10 MG IN INHA: 1.0000 | RESPIRATORY_TRACT | 0 refills | Status: AC | PRN

## 2018-08-31 NOTE — Progress Notes (Signed)
Pt requested from Med Mgmt to try nicotrol inhaler.

## 2018-09-22 ENCOUNTER — Ambulatory Visit: Payer: Self-pay | Admitting: Licensed Clinical Social Worker

## 2018-09-22 DIAGNOSIS — F4310 Post-traumatic stress disorder, unspecified: Secondary | ICD-10-CM

## 2018-09-22 DIAGNOSIS — F411 Generalized anxiety disorder: Secondary | ICD-10-CM

## 2018-09-22 DIAGNOSIS — F332 Major depressive disorder, recurrent severe without psychotic features: Secondary | ICD-10-CM

## 2018-09-22 NOTE — BH Specialist Note (Signed)
Integrated Behavioral Health Follow Up Visit  MRN: [id number] Name: Alexandra Campos  Number of [place] Clinician visits: 3/6   Type of Service: [place] Interpretor:No. Interpretor Name and Language: Not applicable.   SUBJECTIVE: Alexandra Campos is a 62 y.o. female accompanied by herself. Patient was referred by Benjamine Mola NP at [facility] for mental health. Patient reports the following symptoms/concerns: She explains that she had a pretty good Christmas and was baptized on New Year's day. She reports that she hasn't had a drink since before Christmas. She explains that she had to take her daughter's father to the hospital and turned out he had a busted artery due to alcoholism. She notes that she is trying to straighten things out in terms of stress first before she fully gives up smoking cigarettes all together. She reports that she is trying to pray and put things in God's hands. She explains that she has been worrying about her son because he is drinking alcohol heavily and taking out his anger on others. She denies suicidal and homicidal thoughts.  Duration of problem: ; Severity of problem: moderate  OBJECTIVE: Mood: Euthymic and Affect: Appropriate Risk of harm to self or others: No plan to harm self or others  LIFE CONTEXT: Family and Social: Ms. Plant reports that she enjoyed spending Christmas and New Year's with her family. School/Work: Ms. Tep is working with a Chief Executive Officer to apply for disability. Self-Care: Ms. Prindle has been trying to focus on improving her health. She did not stick to her goal of trying to quit smoking before the new year due to situational stressors. Life Changes: Ms. Timm reports that she is worried about her son.  GOALS ADDRESSED: Patient will: 1.  Reduce symptoms of: anxiety  2.  Increase knowledge and/or ability of: stress reduction  3.  Demonstrate ability to: Increase healthy  adjustment to current life circumstances  INTERVENTIONS: Interventions utilized:  Brief CBT was utilized by the clinician focusing on anxiety and affect on normal cognition. Clinician discussed the factors with the patient that are contributing to her symptoms of anxiety. Clinician explained that her son's increase in drinking is beyond her control and not her fault. Clinician explained that she can pray for him, be supportive, and try to talk with him. Clinician explained that since her son is an adult that she cannot force him to do anything that she does not want to do. Clinician encouraged the patient to focus on the positives rather than the negatives.  Standardized Assessments completed: GAD-7 and PHQ 9 Anxiety is at a 10 and depression is at a 10. ASSESSMENT: Patient currently experiencing symptoms of anxiety due to worrying about her son.   Patient may benefit from trying to focus on the positives rather than negatives and things that are out of her control.  PLAN: 1. Follow up with behavioral health clinician on : 1 month 2. Behavioral recommendations: See above 3. Referral(s): [place] (In Clinic) 4. "From scale of 1-10, how likely are you to follow plan?": [place], LCSW

## 2018-09-27 ENCOUNTER — Telehealth: Payer: Self-pay | Admitting: Licensed Clinical Social Worker

## 2018-09-27 NOTE — Telephone Encounter (Signed)
Patient left clinician a voicemail to contact her regarding if she can be prescribed anything for sleep. She notes that she is having a hard time falling asleep, hasn't slept in the last three nights, thoughts constantly racing, and stressed out.  Clinician returned the patient's phone call. She explained that she would speak with Dr. Octavia Heir, MD, psychiatric consultant regarding options tomorrow and would give her a call back on Wednesday.

## 2018-09-29 ENCOUNTER — Other Ambulatory Visit: Payer: Self-pay | Admitting: Gerontology

## 2018-09-29 DIAGNOSIS — E1159 Type 2 diabetes mellitus with other circulatory complications: Secondary | ICD-10-CM

## 2018-09-29 DIAGNOSIS — I10 Essential (primary) hypertension: Principal | ICD-10-CM

## 2018-09-30 ENCOUNTER — Other Ambulatory Visit: Payer: Self-pay | Admitting: Internal Medicine

## 2018-09-30 ENCOUNTER — Other Ambulatory Visit: Payer: Self-pay

## 2018-09-30 DIAGNOSIS — Z789 Other specified health status: Secondary | ICD-10-CM

## 2018-09-30 MED ORDER — TRAZODONE HCL 50 MG PO TABS: ORAL_TABLET | ORAL | 0 refills | Status: DC

## 2018-10-20 ENCOUNTER — Ambulatory Visit: Payer: Self-pay | Admitting: Licensed Clinical Social Worker

## 2018-10-25 ENCOUNTER — Other Ambulatory Visit: Payer: Self-pay

## 2018-10-25 ENCOUNTER — Encounter: Payer: Self-pay | Admitting: Gerontology

## 2018-10-25 ENCOUNTER — Ambulatory Visit: Payer: Self-pay | Admitting: Licensed Clinical Social Worker

## 2018-10-25 ENCOUNTER — Ambulatory Visit: Payer: Self-pay | Admitting: Gerontology

## 2018-10-25 VITALS — BP 134/77 | HR 69 | Ht 60.0 in | Wt 221.9 lb

## 2018-10-25 DIAGNOSIS — E1159 Type 2 diabetes mellitus with other circulatory complications: Secondary | ICD-10-CM

## 2018-10-25 DIAGNOSIS — F411 Generalized anxiety disorder: Secondary | ICD-10-CM

## 2018-10-25 DIAGNOSIS — E119 Type 2 diabetes mellitus without complications: Secondary | ICD-10-CM

## 2018-10-25 DIAGNOSIS — F17200 Nicotine dependence, unspecified, uncomplicated: Secondary | ICD-10-CM

## 2018-10-25 DIAGNOSIS — H6123 Impacted cerumen, bilateral: Secondary | ICD-10-CM

## 2018-10-25 DIAGNOSIS — N393 Stress incontinence (female) (male): Secondary | ICD-10-CM

## 2018-10-25 DIAGNOSIS — I10 Essential (primary) hypertension: Secondary | ICD-10-CM

## 2018-10-25 DIAGNOSIS — F4310 Post-traumatic stress disorder, unspecified: Secondary | ICD-10-CM

## 2018-10-25 DIAGNOSIS — F332 Major depressive disorder, recurrent severe without psychotic features: Secondary | ICD-10-CM

## 2018-10-25 DIAGNOSIS — I152 Hypertension secondary to endocrine disorders: Secondary | ICD-10-CM

## 2018-10-25 MED ORDER — LOSARTAN POTASSIUM 50 MG PO TABS: 50.0000 mg | ORAL_TABLET | Freq: Every day | ORAL | 3 refills | Status: DC

## 2018-10-25 MED ORDER — CARBAMIDE PEROXIDE 6.5 % OT SOLN: 5.0000 [drp] | Freq: Two times a day (BID) | OTIC | Status: DC

## 2018-10-25 NOTE — Patient Instructions (Signed)
Kegel Exercises Kegel exercises help strengthen the muscles that support the rectum, vagina, small intestine, bladder, and uterus. Doing Kegel exercises can help:  Improve bladder and bowel control.  Improve sexual response.  Reduce problems and discomfort during pregnancy. Kegel exercises involve squeezing your pelvic floor muscles, which are the same muscles you squeeze when you try to stop the flow of urine. The exercises can be done while sitting, standing, or lying down, but it is best to vary your position. Exercises 1. Squeeze your pelvic floor muscles tight. You should feel a tight lift in your rectal area. If you are a female, you should also feel a tightness in your vaginal area. Keep your stomach, buttocks, and legs relaxed. 2. Hold the muscles tight for up to 10 seconds. 3. Relax your muscles. Repeat this exercise 50 times a day or as many times as told by your health care provider. Continue to do this exercise for at least 4-6 weeks or for as long as told by your health care provider. This information is not intended to replace advice given to you by your health care provider. Make sure you discuss any questions you have with your health care provider. Document Released: 08/17/2012 Document Revised: 01/11/2017 Document Reviewed: 07/21/2015 Elsevier Interactive Patient Education  2019 [place] DASH stands for "Dietary Approaches to Stop Hypertension." The DASH eating plan is a healthy eating plan that has been shown to reduce high blood pressure (hypertension). It may also reduce your risk for type 2 diabetes, heart disease, and stroke. The DASH eating plan may also help with weight loss. What are tips for following this plan?  General guidelines  Avoid eating more than 2,300 mg (milligrams) of salt (sodium) a day. If you have hypertension, you may need to reduce your sodium intake to 1,500 mg a day.  Limit alcohol intake to no more than 1 drink a day for  nonpregnant women and 2 drinks a day for men. One drink equals 12 oz of beer, 5 oz of wine, or 1 oz of hard liquor.  Work with your health care provider to maintain a healthy body weight or to lose weight. Ask what an ideal weight is for you.  Get at least 30 minutes of exercise that causes your heart to beat faster (aerobic exercise) most days of the week. Activities may include walking, swimming, or biking.  Work with your health care provider or diet and nutrition specialist (dietitian) to adjust your eating plan to your individual calorie needs. Reading food labels   Check food labels for the amount of sodium per serving. Choose foods with less than 5 percent of the Daily Value of sodium. Generally, foods with less than 300 mg of sodium per serving fit into this eating plan.  To find whole grains, look for the word "whole" as the first word in the ingredient list. Shopping  Buy products labeled as "low-sodium" or "no salt added."  Buy fresh foods. Avoid canned foods and premade or frozen meals. Cooking  Avoid adding salt when cooking. Use salt-free seasonings or herbs instead of table salt or sea salt. Check with your health care provider or pharmacist before using salt substitutes.  Do not fry foods. Cook foods using healthy methods such as baking, boiling, grilling, and broiling instead.  Cook with heart-healthy oils, such as olive, canola, soybean, or sunflower oil. Meal planning  Eat a balanced diet that includes: ? 5 or more servings of fruits and vegetables each day.  At each meal, try to fill half of your plate with fruits and vegetables. ? Up to 6-8 servings of whole grains each day. ? Less than 6 oz of lean meat, poultry, or fish each day. A 3-oz serving of meat is about the same size as a deck of cards. One egg equals 1 oz. ? 2 servings of low-fat dairy each day. ? A serving of nuts, seeds, or beans 5 times each week. ? Heart-healthy fats. Healthy fats called Omega-3  fatty acids are found in foods such as flaxseeds and coldwater fish, like sardines, salmon, and mackerel.  Limit how much you eat of the following: ? Canned or prepackaged foods. ? Food that is high in trans fat, such as fried foods. ? Food that is high in saturated fat, such as fatty meat. ? Sweets, desserts, sugary drinks, and other foods with added sugar. ? Full-fat dairy products.  Do not salt foods before eating.  Try to eat at least 2 vegetarian meals each week.  Eat more home-cooked food and less restaurant, buffet, and fast food.  When eating at a restaurant, ask that your food be prepared with less salt or no salt, if possible. What foods are recommended? The items listed may not be a complete list. Talk with your dietitian about what dietary choices are best for you. Grains Whole-grain or whole-wheat bread. Whole-grain or whole-wheat pasta. Brown rice. [place]. Bulgur. Whole-grain and low-sodium cereals. Pita bread. Low-fat, low-sodium crackers. Whole-wheat flour tortillas. Vegetables Fresh or frozen vegetables (raw, steamed, roasted, or grilled). Low-sodium or reduced-sodium tomato and vegetable juice. Low-sodium or reduced-sodium tomato sauce and tomato paste. Low-sodium or reduced-sodium canned vegetables. Fruits All fresh, dried, or frozen fruit. Canned fruit in natural juice (without added sugar). Meat and other protein foods Skinless chicken or [place]. Ground chicken or [place]. Pork with fat trimmed off. Fish and seafood. Egg whites. Dried beans, peas, or lentils. Unsalted nuts, nut butters, and seeds. Unsalted canned beans. Lean cuts of beef with fat trimmed off. Low-sodium, lean deli meat. Dairy Low-fat (1%) or fat-free (skim) milk. Fat-free, low-fat, or reduced-fat cheeses. Nonfat, low-sodium ricotta or cottage cheese. Low-fat or nonfat yogurt. Low-fat, low-sodium cheese. Fats and oils Soft margarine without trans fats. Vegetable oil. Low-fat, reduced-fat, or  light mayonnaise and salad dressings (reduced-sodium). Canola, safflower, olive, soybean, and sunflower oils. Avocado. Seasoning and other foods Herbs. Spices. Seasoning mixes without salt. Unsalted popcorn and pretzels. Fat-free sweets. What foods are not recommended? The items listed may not be a complete list. Talk with your dietitian about what dietary choices are best for you. Grains Baked goods made with fat, such as croissants, muffins, or some breads. Dry pasta or rice meal packs. Vegetables Creamed or fried vegetables. Vegetables in a cheese sauce. Regular canned vegetables (not low-sodium or reduced-sodium). Regular canned tomato sauce and paste (not low-sodium or reduced-sodium). Regular tomato and vegetable juice (not low-sodium or reduced-sodium). Angie Fava. Olives. Fruits Canned fruit in a light or heavy syrup. Fried fruit. Fruit in cream or butter sauce. Meat and other protein foods Fatty cuts of meat. Ribs. Fried meat. Berniece Salines. Sausage. Bologna and other processed lunch meats. Salami. Fatback. Hotdogs. Bratwurst. Salted nuts and seeds. Canned beans with added salt. Canned or smoked fish. Whole eggs or egg yolks. Chicken or [place] with skin. Dairy Whole or 2% milk, cream, and half-and-half. Whole or full-fat cream cheese. Whole-fat or sweetened yogurt. Full-fat cheese. Nondairy creamers. Whipped toppings. Processed cheese and cheese spreads. Fats and oils Butter. Stick margarine.  Lard. Shortening. Ghee. Bacon fat. Tropical oils, such as coconut, palm kernel, or palm oil. Seasoning and other foods Salted popcorn and pretzels. Onion salt, garlic salt, seasoned salt, table salt, and sea salt. Worcestershire sauce. Tartar sauce. Barbecue sauce. Teriyaki sauce. Soy sauce, including reduced-sodium. Steak sauce. Canned and packaged gravies. Fish sauce. Oyster sauce. Cocktail sauce. Horseradish that you find on the shelf. Ketchup. Mustard. Meat flavorings and tenderizers. Bouillon cubes. Hot  sauce and Tabasco sauce. Premade or packaged marinades. Premade or packaged taco seasonings. Relishes. Regular salad dressings. Where to find more information:  National Heart, Lung, and [place]: https://wilson-eaton.com/  American Heart Association: www.heart.org Summary  The DASH eating plan is a healthy eating plan that has been shown to reduce high blood pressure (hypertension). It may also reduce your risk for type 2 diabetes, heart disease, and stroke.  With the DASH eating plan, you should limit salt (sodium) intake to 2,300 mg a day. If you have hypertension, you may need to reduce your sodium intake to 1,500 mg a day.  When on the DASH eating plan, aim to eat more fresh fruits and vegetables, whole grains, lean proteins, low-fat dairy, and heart-healthy fats.  Work with your health care provider or diet and nutrition specialist (dietitian) to adjust your eating plan to your individual calorie needs. This information is not intended to replace advice given to you by your health care provider. Make sure you discuss any questions you have with your health care provider. Document Released: 08/20/2011 Document Revised: 08/24/2016 Document Reviewed: 08/24/2016 Elsevier Interactive Patient Education  2019 [employer].
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
## 2018-10-25 NOTE — BH Specialist Note (Signed)
Integrated Behavioral Health Follow Up Visit  MRN: [id number] Name: Alexandra Campos  Number of [place] Clinician visits: 4/6  Type of Service: [place] Interpretor:No. Interpretor Name and Language: Not applicable.   SUBJECTIVE: Alexandra Campos is a 63 y.o. female accompanied by herself. Patient was referred by Carlyon Shadow Np at [facility] for mental health Patient reports the following symptoms/concerns: She describes having vivid dreams about death. She explains that in the past she has had premonitions about someone passing away and then they died a few days later. She explained that its possible she is remembering the funerals in the past rather than having premonitions. She explains that she only takes the Trazodone two to three times a week because on the nights that she doesn't take it, she will drink a few beers. She notes that she feels depressed every time she thinks about money. She notes that she is going to church every Sunday. She explains that she is worried about her son, disability, her high blood pressure, her weight, and overall health in general. She denies suicidal and homicidal thoughts.  Duration of problem:  Severity of problem: moderate  OBJECTIVE: Mood: Euthymic and Affect: Appropriate Risk of harm to self or others: No plan to harm self or others  LIFE CONTEXT: Family and Social: Ms. Price continues to worry about her son. School/Work: Ms. Line has applied for disability. Self-Care: Ms. Sitzer will not take her Trazodone on the nights that she drinks. Life Changes: Ms. Kasparek is concerned about her health.  GOALS ADDRESSED: Patient will: 1.  Reduce symptoms of: anxiety  2.  Increase knowledge and/or ability of: healthy habits, self-management skills and stress reduction  3.  Demonstrate ability to: Increase healthy adjustment to current life  circumstances  INTERVENTIONS: Interventions utilized:  Motivational Interviewing and Brief CBT was utilized by the clinician focusing on her anxiety and affect on normal cognition. Clinician processed with the patient regarding how she has been doing since the last session. Clinician explained that she spoke with her provider in clinic who stated that she has been having vivid dreams about death. Clinician discussed the details of the vivid dreams with the patient. Clinician asked the patient if its possible if these dreams are not premonitions and just memories regarding the past. Clinician explained that she is glad that she is being responsible and not taking her medicine when she drinks alcohol but how is it going to help her in the long run. Clinician explained that alcohol may temporarily help her in the moment but it won't take away her symptoms of depression or anxiety. Clinician encouraged the patient to utilize healthier outlets instead of negative coping skills to assist her with her mental health symptoms.  Standardized Assessments completed: GAD-7 and PHQ 9  ASSESSMENT: Patient currently experiencing symptoms of anxiety regarding her overall health.   Patient may benefit from continuing with therapy.  PLAN: 1. Follow up with behavioral health clinician on : 1 week or earlier if needed. 2. Behavioral recommendations: Case consultation with Dr. Octavia Heir on 2/12/200. Dr. Octavia Heir, MD, Psychiatric consultant explained that it sounds like Ms. Kincade is being responsible in terms of her drinking and taking the medication. His recommendation was to continue to explore her dreams of death and continue to utilize motivational interviewing in regards to her drinking. Continue Trazodone and make no changes to her medications at this time. 3. Referral(s): [facility] (In Clinic) 4. "From scale of 1-10,  how likely are you to follow plan?"8  Bayard Hugger, LCSW

## 2018-10-26 ENCOUNTER — Telehealth: Payer: Self-pay | Admitting: Pharmacy Technician

## 2018-10-26 ENCOUNTER — Other Ambulatory Visit: Payer: Self-pay

## 2018-10-26 DIAGNOSIS — I10 Essential (primary) hypertension: Secondary | ICD-10-CM

## 2018-10-26 DIAGNOSIS — E119 Type 2 diabetes mellitus without complications: Secondary | ICD-10-CM

## 2018-10-27 LAB — LIPID PANEL
Chol/HDL Ratio: 3.5 ratio (ref 0.0–4.4)
Cholesterol, Total: 181 mg/dL (ref 100–199)
HDL: 51 mg/dL (ref >39)
LDL Calculated: 102 mg/dL — ABNORMAL HIGH (ref 0–99)
Triglycerides: 141 mg/dL (ref 0–149)
VLDL Cholesterol Cal: 28 mg/dL (ref 5–40)

## 2018-10-27 LAB — COMPREHENSIVE METABOLIC PANEL
ALT: 16 IU/L (ref 0–32)
AST: 14 IU/L (ref 0–40)
Albumin/Globulin Ratio: 1.3 (ref 1.2–2.2)
Albumin: 3.9 g/dL (ref 3.8–4.8)
Alkaline Phosphatase: 71 IU/L (ref 39–117)
BUN/Creatinine Ratio: 19 (ref 12–28)
BUN: 19 mg/dL (ref 8–27)
Bilirubin Total: 0.3 mg/dL (ref 0.0–1.2)
CALCIUM: 10.1 mg/dL (ref 8.7–10.3)
CO2: 25 mmol/L (ref 20–29)
CREATININE: 1.01 mg/dL — AB (ref 0.57–1.00)
Chloride: 99 mmol/L (ref 96–106)
GFR calc Af Amer: 69 mL/min/{1.73_m2} (ref >59)
GFR, EST NON AFRICAN AMERICAN: 60 mL/min/{1.73_m2} (ref >59)
Globulin, Total: 3 g/dL (ref 1.5–4.5)
Glucose: 146 mg/dL — ABNORMAL HIGH (ref 65–99)
Potassium: 4.2 mmol/L (ref 3.5–5.2)
Sodium: 140 mmol/L (ref 134–144)
Total Protein: 6.9 g/dL (ref 6.0–8.5)

## 2018-10-27 LAB — HEMOGLOBIN A1C
Est. average glucose Bld gHb Est-mCnc: 186 mg/dL
Hgb A1c MFr Bld: 8.1 % — ABNORMAL HIGH (ref 4.8–5.6)

## 2018-10-28 ENCOUNTER — Other Ambulatory Visit: Payer: Self-pay | Admitting: Gerontology

## 2018-10-28 LAB — URINALYSIS
Bilirubin, UA: NEGATIVE
Glucose, UA: NEGATIVE
Ketones, UA: NEGATIVE
LEUKOCYTES UA: NEGATIVE
NITRITE UA: NEGATIVE
Protein, UA: NEGATIVE
RBC, UA: NEGATIVE
Specific Gravity, UA: 1.025 (ref 1.005–1.030)
Urobilinogen, Ur: 1 mg/dL (ref 0.2–1.0)
pH, UA: 7 (ref 5.0–7.5)

## 2018-10-28 LAB — URINE CULTURE

## 2018-11-08 ENCOUNTER — Other Ambulatory Visit: Payer: Self-pay

## 2018-11-08 ENCOUNTER — Encounter: Payer: Self-pay | Admitting: Gerontology

## 2018-11-08 ENCOUNTER — Ambulatory Visit: Payer: Self-pay | Admitting: Licensed Clinical Social Worker

## 2018-11-08 ENCOUNTER — Ambulatory Visit: Payer: Self-pay | Admitting: Gerontology

## 2018-11-08 VITALS — BP 134/87 | HR 80 | Ht 60.0 in | Wt 214.6 lb

## 2018-11-08 DIAGNOSIS — Z8719 Personal history of other diseases of the digestive system: Secondary | ICD-10-CM

## 2018-11-08 DIAGNOSIS — F17200 Nicotine dependence, unspecified, uncomplicated: Secondary | ICD-10-CM

## 2018-11-08 DIAGNOSIS — F332 Major depressive disorder, recurrent severe without psychotic features: Secondary | ICD-10-CM

## 2018-11-08 DIAGNOSIS — E119 Type 2 diabetes mellitus without complications: Secondary | ICD-10-CM

## 2018-11-08 DIAGNOSIS — H6123 Impacted cerumen, bilateral: Secondary | ICD-10-CM

## 2018-11-08 DIAGNOSIS — I10 Essential (primary) hypertension: Secondary | ICD-10-CM

## 2018-11-08 DIAGNOSIS — F411 Generalized anxiety disorder: Secondary | ICD-10-CM

## 2018-11-08 DIAGNOSIS — N393 Stress incontinence (female) (male): Secondary | ICD-10-CM

## 2018-11-08 MED ORDER — OMEPRAZOLE 20 MG PO CPDR: 20.0000 mg | DELAYED_RELEASE_CAPSULE | Freq: Every day | ORAL | 2 refills | Status: DC

## 2018-11-08 MED ORDER — CHLORTHALIDONE 50 MG PO TABS: 50.0000 mg | ORAL_TABLET | Freq: Every day | ORAL | 2 refills | Status: DC

## 2018-11-08 MED ORDER — METFORMIN HCL 1000 MG PO TABS: 1000.0000 mg | ORAL_TABLET | Freq: Two times a day (BID) | ORAL | 2 refills | Status: DC

## 2018-11-08 NOTE — Patient Instructions (Signed)
Fat and Cholesterol Restricted Eating Plan Getting too much fat and cholesterol in your diet may cause health problems. Choosing the right foods helps keep your fat and cholesterol at normal levels. This can keep you from getting certain diseases. Your doctor may recommend an eating plan that includes:  Total fat: ______% or less of total calories a day.  Saturated fat: ______% or less of total calories a day.  Cholesterol: less than _________mg a day.  Fiber: ______g a day. What are tips for following this plan? Meal planning  At meals, divide your plate into four equal parts: ? Fill one-half of your plate with vegetables and green salads. ? Fill one-fourth of your plate with whole grains. ? Fill one-fourth of your plate with low-fat (lean) protein foods.  Eat fish that is high in omega-3 fats at least two times a week. This includes mackerel, tuna, sardines, and salmon.  Eat foods that are high in fiber, such as whole grains, beans, apples, broccoli, carrots, peas, and barley. General tips   Work with your doctor to lose weight if you need to.  Avoid: ? Foods with added sugar. ? Fried foods. ? Foods with partially hydrogenated oils.  Limit alcohol intake to no more than 1 drink a day for nonpregnant women and 2 drinks a day for men. One drink equals 12 oz of beer, 5 oz of wine, or 1 oz of hard liquor. Reading food labels  Check food labels for: ? Trans fats. ? Partially hydrogenated oils. ? Saturated fat (g) in each serving. ? Cholesterol (mg) in each serving. ? Fiber (g) in each serving.  Choose foods with healthy fats, such as: ? Monounsaturated fats. ? Polyunsaturated fats. ? Omega-3 fats.  Choose grain products that have whole grains. Look for the word "whole" as the first word in the ingredient list. Cooking  Cook foods using low-fat methods. These include baking, boiling, grilling, and broiling.  Eat more home-cooked foods. Eat at restaurants and buffets  less often.  Avoid cooking using saturated fats, such as butter, cream, palm oil, palm kernel oil, and coconut oil. Recommended foods  Fruits  All fresh, canned (in natural juice), or frozen fruits. Vegetables  Fresh or frozen vegetables (raw, steamed, roasted, or grilled). Green salads. Grains  Whole grains, such as whole wheat or whole grain breads, crackers, cereals, and pasta. Unsweetened oatmeal, bulgur, barley, quinoa, or brown rice. Corn or whole wheat flour tortillas. Meats and other protein foods  Ground beef (85% or leaner), grass-fed beef, or beef trimmed of fat. Skinless chicken or turkey. Ground chicken or turkey. Pork trimmed of fat. All fish and seafood. Egg whites. Dried beans, peas, or lentils. Unsalted nuts or seeds. Unsalted canned beans. Nut butters without added sugar or oil. Dairy  Low-fat or nonfat dairy products, such as skim or 1% milk, 2% or reduced-fat cheeses, low-fat and fat-free ricotta or cottage cheese, or plain low-fat and nonfat yogurt. Fats and oils  Tub margarine without trans fats. Light or reduced-fat mayonnaise and salad dressings. Avocado. Olive, canola, sesame, or safflower oils. The items listed above may not be a complete list of foods and beverages you can eat. Contact a dietitian for more information. Foods to avoid Fruits  Canned fruit in heavy syrup. Fruit in cream or butter sauce. Fried fruit. Vegetables  Vegetables cooked in cheese, cream, or butter sauce. Fried vegetables. Grains  White bread. White pasta. White rice. Cornbread. Bagels, pastries, and croissants. Crackers and snack foods that contain trans fat   and hydrogenated oils. Meats and other protein foods  Fatty cuts of meat. Ribs, chicken wings, bacon, sausage, bologna, salami, chitterlings, fatback, hot dogs, bratwurst, and packaged lunch meats. Liver and organ meats. Whole eggs and egg yolks. Chicken and turkey with skin. Fried meat. Dairy  Whole or 2% milk, cream,  half-and-half, and cream cheese. Whole milk cheeses. Whole-fat or sweetened yogurt. Full-fat cheeses. Nondairy creamers and whipped toppings. Processed cheese, cheese spreads, and cheese curds. Beverages  Alcohol. Sugar-sweetened drinks such as sodas, lemonade, and fruit drinks. Fats and oils  Butter, stick margarine, lard, shortening, ghee, or bacon fat. Coconut, palm kernel, and palm oils. Sweets and desserts  Corn syrup, sugars, honey, and molasses. Candy. Jam and jelly. Syrup. Sweetened cereals. Cookies, pies, cakes, donuts, muffins, and ice cream. The items listed above may not be a complete list of foods and beverages you should avoid. Contact a dietitian for more information. Summary  Choosing the right foods helps keep your fat and cholesterol at normal levels. This can keep you from getting certain diseases.  At meals, fill one-half of your plate with vegetables and green salads.  Eat high-fiber foods, like whole grains, beans, apples, carrots, peas, and barley.  Limit added sugar, saturated fats, alcohol, and fried foods. This information is not intended to replace advice given to you by your health care provider. Make sure you discuss any questions you have with your health care provider. Document Released: 03/01/2012 Document Revised: 05/04/2018 Document Reviewed: 05/18/2017 Elsevier Interactive Patient Education  2019 [employer]   DASH Eating Plan DASH stands for "Dietary Approaches to Stop Hypertension." The DASH eating plan is a healthy eating plan that has been shown to reduce high blood pressure (hypertension). It may also reduce your risk for type 2 diabetes, heart disease, and stroke. The DASH eating plan may also help with weight loss. What are tips for following this plan?  General guidelines  Avoid eating more than 2,300 mg (milligrams) of salt (sodium) a day. If you have hypertension, you may need to reduce your sodium intake to 1,500 mg a day.  Limit  alcohol intake to no more than 1 drink a day for nonpregnant women and 2 drinks a day for men. One drink equals 12 oz of beer, 5 oz of wine, or 1 oz of hard liquor.  Work with your health care provider to maintain a healthy body weight or to lose weight. Ask what an ideal weight is for you.  Get at least 30 minutes of exercise that causes your heart to beat faster (aerobic exercise) most days of the week. Activities may include walking, swimming, or biking.  Work with your health care provider or diet and nutrition specialist (dietitian) to adjust your eating plan to your individual calorie needs. Reading food labels   Check food labels for the amount of sodium per serving. Choose foods with less than 5 percent of the Daily Value of sodium. Generally, foods with less than 300 mg of sodium per serving fit into this eating plan.  To find whole grains, look for the word "whole" as the first word in the ingredient list. Shopping  Buy products labeled as "low-sodium" or "no salt added."  Buy fresh foods. Avoid canned foods and premade or frozen meals. Cooking  Avoid adding salt when cooking. Use salt-free seasonings or herbs instead of table salt or sea salt. Check with your health care provider or pharmacist before using salt substitutes.  Do not fry foods. Cook foods using healthy   methods such as baking, boiling, grilling, and broiling instead.  Cook with heart-healthy oils, such as olive, canola, soybean, or sunflower oil. Meal planning  Eat a balanced diet that includes: ? 5 or more servings of fruits and vegetables each day. At each meal, try to fill half of your plate with fruits and vegetables. ? Up to 6-8 servings of whole grains each day. ? Less than 6 oz of lean meat, poultry, or fish each day. A 3-oz serving of meat is about the same size as a deck of cards. One egg equals 1 oz. ? 2 servings of low-fat dairy each day. ? A serving of nuts, seeds, or beans 5 times each  week. ? Heart-healthy fats. Healthy fats called Omega-3 fatty acids are found in foods such as flaxseeds and coldwater fish, like sardines, salmon, and mackerel.  Limit how much you eat of the following: ? Canned or prepackaged foods. ? Food that is high in trans fat, such as fried foods. ? Food that is high in saturated fat, such as fatty meat. ? Sweets, desserts, sugary drinks, and other foods with added sugar. ? Full-fat dairy products.  Do not salt foods before eating.  Try to eat at least 2 vegetarian meals each week.  Eat more home-cooked food and less restaurant, buffet, and fast food.  When eating at a restaurant, ask that your food be prepared with less salt or no salt, if possible. What foods are recommended? The items listed may not be a complete list. Talk with your dietitian about what dietary choices are best for you. Grains Whole-grain or whole-wheat bread. Whole-grain or whole-wheat pasta. Brown rice. Oatmeal. Quinoa. Bulgur. Whole-grain and low-sodium cereals. Pita bread. Low-fat, low-sodium crackers. Whole-wheat flour tortillas. Vegetables Fresh or frozen vegetables (raw, steamed, roasted, or grilled). Low-sodium or reduced-sodium tomato and vegetable juice. Low-sodium or reduced-sodium tomato sauce and tomato paste. Low-sodium or reduced-sodium canned vegetables. Fruits All fresh, dried, or frozen fruit. Canned fruit in natural juice (without added sugar). Meat and other protein foods Skinless chicken or turkey. Ground chicken or turkey. Pork with fat trimmed off. Fish and seafood. Egg whites. Dried beans, peas, or lentils. Unsalted nuts, nut butters, and seeds. Unsalted canned beans. Lean cuts of beef with fat trimmed off. Low-sodium, lean deli meat. Dairy Low-fat (1%) or fat-free (skim) milk. Fat-free, low-fat, or reduced-fat cheeses. Nonfat, low-sodium ricotta or cottage cheese. Low-fat or nonfat yogurt. Low-fat, low-sodium cheese. Fats and oils Soft margarine  without trans fats. Vegetable oil. Low-fat, reduced-fat, or light mayonnaise and salad dressings (reduced-sodium). Canola, safflower, olive, soybean, and sunflower oils. Avocado. Seasoning and other foods Herbs. Spices. Seasoning mixes without salt. Unsalted popcorn and pretzels. Fat-free sweets. What foods are not recommended? The items listed may not be a complete list. Talk with your dietitian about what dietary choices are best for you. Grains Baked goods made with fat, such as croissants, muffins, or some breads. Dry pasta or rice meal packs. Vegetables Creamed or fried vegetables. Vegetables in a cheese sauce. Regular canned vegetables (not low-sodium or reduced-sodium). Regular canned tomato sauce and paste (not low-sodium or reduced-sodium). Regular tomato and vegetable juice (not low-sodium or reduced-sodium). Pickles. Olives. Fruits Canned fruit in a light or heavy syrup. Fried fruit. Fruit in cream or butter sauce. Meat and other protein foods Fatty cuts of meat. Ribs. Fried meat. Bacon. Sausage. Bologna and other processed lunch meats. Salami. Fatback. Hotdogs. Bratwurst. Salted nuts and seeds. Canned beans with added salt. Canned or smoked fish. Whole eggs   or egg yolks. Chicken or turkey with skin. Dairy Whole or 2% milk, cream, and half-and-half. Whole or full-fat cream cheese. Whole-fat or sweetened yogurt. Full-fat cheese. Nondairy creamers. Whipped toppings. Processed cheese and cheese spreads. Fats and oils Butter. Stick margarine. Lard. Shortening. Ghee. Bacon fat. Tropical oils, such as coconut, palm kernel, or palm oil. Seasoning and other foods Salted popcorn and pretzels. Onion salt, garlic salt, seasoned salt, table salt, and sea salt. Worcestershire sauce. Tartar sauce. Barbecue sauce. Teriyaki sauce. Soy sauce, including reduced-sodium. Steak sauce. Canned and packaged gravies. Fish sauce. Oyster sauce. Cocktail sauce. Horseradish that you find on the shelf. Ketchup.  Mustard. Meat flavorings and tenderizers. Bouillon cubes. Hot sauce and Tabasco sauce. Premade or packaged marinades. Premade or packaged taco seasonings. Relishes. Regular salad dressings. Where to find more information:  National Heart, Lung, and Blood Institute: www.nhlbi.nih.gov  American Heart Association: www.heart.org Summary  The DASH eating plan is a healthy eating plan that has been shown to reduce high blood pressure (hypertension). It may also reduce your risk for type 2 diabetes, heart disease, and stroke.  With the DASH eating plan, you should limit salt (sodium) intake to 2,300 mg a day. If you have hypertension, you may need to reduce your sodium intake to 1,500 mg a day.  When on the DASH eating plan, aim to eat more fresh fruits and vegetables, whole grains, lean proteins, low-fat dairy, and heart-healthy fats.  Work with your health care provider or diet and nutrition specialist (dietitian) to adjust your eating plan to your individual calorie needs. This information is not intended to replace advice given to you by your health care provider. Make sure you discuss any questions you have with your health care provider. Document Released: 08/20/2011 Document Revised: 08/24/2016 Document Reviewed: 08/24/2016 Elsevier Interactive Patient Education  2019 [employer] Inc.   

## 2018-11-08 NOTE — BH Specialist Note (Signed)
Integrated Behavioral Health Follow Up Visit  MRN: [id number] Name: Alexandra Campos  Number of [place] Clinician visits: 5/6  Type of Service: [place] Interpretor:No. Interpretor Name and Language: Not applicable.   SUBJECTIVE: Alexandra Campos is a 63 y.o. female accompanied by herself. Patient was referred by Carlyon Shadow NP for mental health. Patient reports the following symptoms/concerns: She explains that the funeral she went to today was the reason she was having the dreams about death. She denies having any nightmares in the past week. She reports that she is trying to take the Trazodone every night and has slowed down on her drinking. She notes that her mood varies. She reports that she has been praying a lot, going to church, and trying to stay positive. She denies suicidal and homicidal thoughts.  Duration of problem: ; Severity of problem: moderate  OBJECTIVE: Mood: Euthymic and Affect: Appropriate Risk of harm to self or others: No plan to harm self or others  LIFE CONTEXT: Family and Social: See above. School/Work: Ms. Tesoro is in the process of applying for disability. Self-Care: See above. Life Changes: See above.  GOALS ADDRESSED: Patient will: 1.  Reduce symptoms of: depression  2.  Increase knowledge and/or ability of: healthy habits and self-management skills  3.  Demonstrate ability to: Increase healthy adjustment to current life circumstances  INTERVENTIONS: Interventions utilized:  Brief CBT was utilized by the clinician by focusing on motivational interviewing towards lifestyle changes. Clinician processed with the patient regarding how she has been doing since the last follow up session. Clinician explained to the patient that she is glad to hear that she is taking the Trazodone every night. Clinician encouraged the patient to slow down on her smoking and to slow down on her drinking. Clinician  encouraged the patient to follow her provider's orders in terms of improving her diet and getting her blood pressure under control. Clinician encouraged the patient to continue to utilize her coping skills.  Standardized Assessments completed: next session.  ASSESSMENT: Patient currently experiencing symptoms of depression due to situational stress..   Patient may benefit from .  PLAN: 1. Follow up with behavioral health clinician on : three weeks or earlier if needed.  2. Behavioral recommendations: See above. 3. Referral(s): [place] (In Clinic) 4. "From scale of 1-10, how likely are you to follow plan?":   Bayard Hugger, LCSW
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
## 2018-11-21 ENCOUNTER — Other Ambulatory Visit: Payer: Self-pay

## 2018-11-21 ENCOUNTER — Ambulatory Visit: Payer: Self-pay | Admitting: Pharmacist

## 2018-11-21 DIAGNOSIS — Z79899 Other long term (current) drug therapy: Secondary | ICD-10-CM

## 2018-11-21 NOTE — Progress Notes (Signed)
  [facility] Visit Note  Patient: Alexandra Campos MRN: [id number] Date of Birth: May 02, 1956 PCP: System, Pcp Not In   Alexandra Campos 63 y.o. female presents for a Medication Management visit today.  Health Maintenance/Date Completed  Last ED visit: 2019 Last Visit to PCP: 10/2018 Next Visit to PCP: 01/2019 [facility])    There were no vitals taken for this visit.  Patient Information   Past Medical History:  Diagnosis Date  . Arthritis   . Diabetes mellitus without complication ([place])   . Hypertension       Past Surgical History:  Procedure Laterality Date  . ANKLE SURGERY Right   . INDUCED ABORTION    . LYMPHADENECTOMY       Family History  Problem Relation Age of Onset  . Diabetes Mother   . Hypertension Mother   . Diabetes Father   . Hypertension Father   . Cancer Father   . Diabetes Brother   . Hypertension Brother   . Diabetes Daughter   . Hypertension Daughter   . Cancer Paternal Uncle   . Diabetes Maternal Grandmother   . Hypertension Maternal Grandmother     New Diagnoses (since last visit):             Social History   Substance and Sexual Activity  Alcohol Use Yes  . Alcohol/week: 6.0 standard drinks  . Types: 6 Cans of beer per week   Comment: On the weekends/socially      Social History   Tobacco Use  Smoking Status Current Every Day Smoker  . Packs/day: 1.00  . Types: Cigarettes  Smokeless Tobacco Never Used  Tobacco Comment   knows she needs to quit      Health Maintenance  Topic Date Due  . Hepatitis C Screening  1955/10/07  . PNEUMOCOCCAL POLYSACCHARIDE VACCINE AGE 42-64 HIGH RISK  10/17/1959  . HIV Screening  10/16/1972  . TETANUS/TDAP  10/16/1976  . PAP SMEAR-Modifier  10/16/1978  . MAMMOGRAM  10/17/2007  . COLONOSCOPY  10/17/2007  . HEMOGLOBIN A1C  04/26/2019  . OPHTHALMOLOGY EXAM  08/19/2019  . FOOT EXAM  11/09/2019  . INFLUENZA VACCINE  Completed     Assessment and Plan: 1. Hypertension  (chlorthalidone, losartan): checks every couple of days. Runs 145-150/87. Has discussed with doctor and was monitoring BP daily, but is now checking on occasion. Discussed AHA goal of <130/80. Did not want her BP checked today due to time constraints (has another appointment).  2. Smoking Cessation: not interested in quitting at this time. Not using her nicotine patch and prefers to quit when she is ready.  3. Diabetes (metformin): usually around 130s (which she states she feels bad when she gets lower than this due to hypoglycemia). Discussed hypoglycemia, goal A1c < 7.0% per AHA/ADA. Checks blood glucose every few days.   She occasionally misses trazodone (especially if she has been consuming alcohol), but is overall compliant with medications. Will plan to see her in 6 months.  Paticia Stack, PharmD Pharmacy Resident  11/21/2018 2:16 PM

## 2018-11-23 ENCOUNTER — Other Ambulatory Visit: Payer: Self-pay

## 2018-11-29 ENCOUNTER — Other Ambulatory Visit: Payer: Self-pay | Admitting: Gerontology

## 2018-11-30 ENCOUNTER — Ambulatory Visit: Payer: Self-pay | Admitting: Internal Medicine

## 2018-11-30 ENCOUNTER — Telehealth: Payer: Self-pay | Admitting: Pharmacy Technician

## 2018-11-30 ENCOUNTER — Encounter (INDEPENDENT_AMBULATORY_CARE_PROVIDER_SITE_OTHER): Payer: Self-pay

## 2018-11-30 NOTE — Telephone Encounter (Signed)
Received 2020 proof of income.  Patient eligible to receive medication assistance at [facility] as long as eligibility requirements continue to be met.  [facility]

## 2018-12-06 ENCOUNTER — Telehealth: Payer: Self-pay | Admitting: Licensed Clinical Social Worker

## 2018-12-06 ENCOUNTER — Ambulatory Visit: Payer: Self-pay | Admitting: Licensed Clinical Social Worker

## 2018-12-06 NOTE — Telephone Encounter (Signed)
Clinician attempted to reach out to the patient three times via the phone numbers listed in her chart. She left a voicemail with a call back number about the phone visit.

## 2018-12-13 ENCOUNTER — Telehealth: Payer: Self-pay | Admitting: Licensed Clinical Social Worker

## 2018-12-13 NOTE — Telephone Encounter (Signed)
Clinician left patient a message to contact clinic regarding rescheduling her last appointment.

## 2018-12-20 ENCOUNTER — Ambulatory Visit: Payer: Self-pay | Admitting: Licensed Clinical Social Worker

## 2018-12-20 ENCOUNTER — Other Ambulatory Visit: Payer: Self-pay

## 2018-12-20 DIAGNOSIS — F332 Major depressive disorder, recurrent severe without psychotic features: Secondary | ICD-10-CM

## 2018-12-20 DIAGNOSIS — F411 Generalized anxiety disorder: Secondary | ICD-10-CM

## 2018-12-20 MED ORDER — TRAZODONE HCL 50 MG PO TABS: ORAL_TABLET | ORAL | 1 refills | Status: DC

## 2018-12-20 NOTE — BH Specialist Note (Signed)
Integrated Behavioral Health Visit via Telephone Note   I connected with Alexandra Campos by telephone and verified that I am speaking with the correct person using two identifiers.  They are located at home.   I am located at the office.   This visit type was conducted due to national recommendations for restrictions regarding the COVID-19 Pandemic (e.g. social distancing).  This format is felt to be most appropriate for this patient at this time.  All issues noted in this document were discussed and addressed.     I discussed the limitations, risks, security and privacy concerns of performing an evaluation and management service by telephone and the availability of in person appointments. The patient expressed understanding and agreed to proceed.     MRN: [id number] Name: Alexandra Campos  Number of [place] Clinician visits: 5/6  Type of Service: [place] Interpretor:No. Interpretor Name and Language:   SUBJECTIVE: Alexandra Campos is a 63 y.o. female accompanied by herself. Patient was referred by Carlyon Shadow NP for mental health Patient reports the following symptoms/concerns: She reports that she has been doing okay. She explains that her plan was to stay with her mom for a few weeks to take care of her but all non residents were asked to vacate her building where she lives. She notes that she has been staying with her son for the past few weeks. She notes that she is spending a lot of time with her youngest grandson. She explains that she has been thinking about her daughter that passed due to the Easter holiday approaching. She explains that health-wise she is doing fine. She explains that she has been trying to pray and take precautions to stay healthy. She reports that she takes the Trazodone every other night and is only drinking on the weekends. She denies suicidal and homicidal thoughts.  Duration of problem: ; Severity of  problem: mild  OBJECTIVE: Mood: Euthymic and Affect: Unable to assess due to phone visit. Risk of harm to self or others: No plan to harm self or others  LIFE CONTEXT: Family and Social: see above School/Work: [place] [place] is in the process of applying for disability. Self-Care: See above. Life Changes: See above.  GOALS ADDRESSED: Patient will: 1.  Reduce symptoms of: depression  2.  Increase knowledge and/or ability of: coping skills, healthy habits and self-management skills  3.  Demonstrate ability to: Increase healthy adjustment to current life circumstances  INTERVENTIONS: Interventions utilized:  Brief CBT was utilized by the clinician focusing on her mood and symptoms of grief. Clinician processed with the patient regarding how she has been doing since the last follow up session. Clinician processed with the patient regarding that it sounds like she has been dealing with some adjustment the past couple of weeks due to COVID 19 and things not going according to plan. Clinician discussed with the patient regarding that its only natural around holidays to think about your loved ones who have passed on and wishing that they could be there to celebrate with them as well. Clinician encouraged the patient to utilize her support system. Clinician encouraged the patient to try to quit drinking alcohol all together but did commend her for being responsible by not drinking on the nights that she does take her Trazodone.  Standardized Assessments completed: GAD-7 and PHQ 9  ASSESSMENT: Patient currently experiencing some symptoms of grief due to the holidays.   Patient may benefit from continuing with therapy and utilizing  her support system.  PLAN: 1. Follow up with behavioral health clinician on : 3 weeks or earlier if needed. 2. Behavioral recommendations: See above 3. Referral(s): [place] (In Clinic) 4. "From scale of 1-10, how likely are you to follow  plan?": [place], LCSW

## 2019-01-04 ENCOUNTER — Other Ambulatory Visit: Payer: Self-pay

## 2019-01-04 DIAGNOSIS — I10 Essential (primary) hypertension: Secondary | ICD-10-CM

## 2019-01-04 DIAGNOSIS — Z789 Other specified health status: Secondary | ICD-10-CM

## 2019-01-04 MED ORDER — CHLORTHALIDONE 50 MG PO TABS: 50.0000 mg | ORAL_TABLET | Freq: Every day | ORAL | 2 refills | Status: DC

## 2019-01-04 MED ORDER — CETIRIZINE HCL 10 MG PO TABS: 10.0000 mg | ORAL_TABLET | Freq: Every day | ORAL | 0 refills | Status: DC

## 2019-01-10 ENCOUNTER — Ambulatory Visit: Payer: Self-pay | Admitting: Licensed Clinical Social Worker

## 2019-01-10 ENCOUNTER — Other Ambulatory Visit: Payer: Self-pay

## 2019-01-10 DIAGNOSIS — F331 Major depressive disorder, recurrent, moderate: Secondary | ICD-10-CM

## 2019-01-10 DIAGNOSIS — F411 Generalized anxiety disorder: Secondary | ICD-10-CM

## 2019-01-10 NOTE — BH Specialist Note (Signed)
Integrated Behavioral Health Follow Up Phone Visit  MRN: [id number] Name: Alexandra Campos  Number of [place] Clinician visits: 6/6  Type of Service: [place] Interpretor:No. Interpretor Name and Language: Not applicable.  SUBJECTIVE: Alexandra Campos is a 63 y.o. female accompanied by herself. Patient was referred by Carlyon Shadow NP for mental health. Patient reports the following symptoms/concerns: She reports that not much has changed since the last session. She reports that she is still staying with her son and has visited her mom a few times. She explains that she has been having a hard time with reading and focusing on what she is reading. She explains that she has been bored and is snacking a little bit more. She explains that she is trying to cook and clean for her son prior to them getting home from work. She explains that she is taking the Trazodone every other night. She explains that she doesn't like taking her pills every night. She reports that she is only drinking on the weekends. She explains that there anxiety is higher because it bothers her not being able to contribute to the household financially. She denies suicidal and homicidal thoughts.  Duration of problem: ; Severity of problem: mild  OBJECTIVE: Mood: Euthymic and Affect: Appropriate Risk of harm to self or others: No plan to harm self or others  LIFE CONTEXT: Family and Social: see above School/Work: see above Self-Care: see above Life Changes: see above  GOALS ADDRESSED: Patient will: 1.  Reduce symptoms of: anxiety  2.  Increase knowledge and/or ability of: coping skills and healthy habits  3.  Demonstrate ability to: Increase healthy adjustment to current life circumstances  INTERVENTIONS: Interventions utilized:  Brief CBT was utilized by the clinician focusing on the patient's anxiety and affect on normal cognition. Clinician processed with the  patient regarding how she has been doing since the last follow up session. Clinician explained that it sounds like she is trying to keep herself busy and occupied. Clinician explained that its possible that her difficulty concentrating is due to the fact that what she is trying to read is not catching her interest. Clinician encouraged the patient to practice self care, utilize her coping skills, and lean on her support system. Standardized Assessments completed: GAD-7 and PHQ 9  ASSESSMENT: Patient currently experiencing symptoms of anxiety due to problems with finances .   Patient may benefit from continuing with therapy and medication.  PLAN: 1. Follow up with behavioral health clinician on : 1 month or earlier if needed. 2. Behavioral recommendations: see above. 3. Referral(s): [place] (In Clinic) 4. "From scale of 1-10, how likely are you to follow plan?": [place], LCSW

## 2019-02-07 ENCOUNTER — Other Ambulatory Visit: Payer: Self-pay

## 2019-02-07 ENCOUNTER — Ambulatory Visit: Payer: Self-pay | Admitting: Licensed Clinical Social Worker

## 2019-02-07 ENCOUNTER — Encounter: Payer: Self-pay | Admitting: Gerontology

## 2019-02-07 ENCOUNTER — Ambulatory Visit: Payer: Self-pay | Admitting: Gerontology

## 2019-02-07 VITALS — BP 132/82

## 2019-02-07 DIAGNOSIS — E119 Type 2 diabetes mellitus without complications: Secondary | ICD-10-CM

## 2019-02-07 DIAGNOSIS — F331 Major depressive disorder, recurrent, moderate: Secondary | ICD-10-CM

## 2019-02-07 DIAGNOSIS — N393 Stress incontinence (female) (male): Secondary | ICD-10-CM

## 2019-02-07 DIAGNOSIS — I10 Essential (primary) hypertension: Secondary | ICD-10-CM

## 2019-02-07 DIAGNOSIS — F411 Generalized anxiety disorder: Secondary | ICD-10-CM

## 2019-02-07 NOTE — BH Specialist Note (Signed)
Integrated Behavioral Health Follow Up Visit Via Phone  MRN: [id number] Name: Alexandra Campos  Type of Service: [place] Interpretor:No. Interpretor Name and Language: Not applicable.  SUBJECTIVE: Alexandra Campos is a 63 y.o. female accompanied by herself. Patient was referred by Berdine Addison, NP for mental health. Patient reports the following symptoms/concerns: She reports that she has been doing a little bit better since the last follow up session. She reports that she was awarded disability and is awaiting a decision for approval for SSI. She describes having a hard time this past weekend due to it being the anniversary of her daughter's death seven years on 01/08/2023. She explains that she is trying to focus on the positives rather than the negatives in her life right now. She explains that she is feeling a lot better about being able to contribute financially within the next month and finding a place to live with her son. She admits to drinking a pint of liquor of the weekend. She reports that she has cut down on her drinking and her smoking cigarettes. She denies suicidal and homicidal thoughts.  Duration of problem: ; Severity of problem: mild  OBJECTIVE: Mood: Euthymic and Affect: Appropriate Risk of harm to self or others: No plan to harm self or others  LIFE CONTEXT: Family and Social: see above. School/Work: see above. Self-Care: see above. Life Changes: see above.  GOALS ADDRESSED: Patient will: 1.  Reduce symptoms of: depression  2.  Increase knowledge and/or ability of: coping skills and healthy habits  3.  Demonstrate ability to: Increase healthy adjustment to current life circumstances  INTERVENTIONS: Interventions utilized:  Brief CBT was utilized by the clinician focusing on the patient's mood and affect on behavior. Clinician processed with the patient regarding how she has been doing since the last follow up session. Clinician  explained to the patient that she is glad to hear that she received good news about her disability. Clinician discussed with the patient regarding how she is doing in terms of her mood. Clinician encouraged the patient to slow down on her drinking and working out finding healthier outlets for her depression and grief. Clinician explained to the patient that the drinking is not going to make her pain go away and asked her to consider to making some lifestyle changes. Standardized Assessments completed: GAD-7 and PHQ 9  ASSESSMENT: Patient currently experiencing see above.   Patient may benefit from see above.  PLAN: 1. Follow up with behavioral health clinician on : one month or earlier if needed. 2. Behavioral recommendations: see above. 3. Referral(s): [place] (In Clinic) 4. "From scale of 1-10, how likely are you to follow plan?": [place], LCSW

## 2019-02-07 NOTE — Patient Instructions (Signed)
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
## 2019-02-20 ENCOUNTER — Other Ambulatory Visit: Payer: Self-pay | Admitting: Gerontology

## 2019-02-20 DIAGNOSIS — Z789 Other specified health status: Secondary | ICD-10-CM

## 2019-03-07 ENCOUNTER — Other Ambulatory Visit: Payer: Self-pay

## 2019-03-07 ENCOUNTER — Ambulatory Visit: Payer: Self-pay | Admitting: Licensed Clinical Social Worker

## 2019-03-07 DIAGNOSIS — F411 Generalized anxiety disorder: Secondary | ICD-10-CM

## 2019-03-07 DIAGNOSIS — F331 Major depressive disorder, recurrent, moderate: Secondary | ICD-10-CM

## 2019-03-07 NOTE — BH Specialist Note (Signed)
Integrated Behavioral Health Follow Up Visit Via Phone  MRN: [id number] Name: Alexandra Campos  Type of Service: [place] Interpretor:No. Interpretor Name and Language: Not applicable.  SUBJECTIVE: Alexandra Campos is a 63 y.o. female accompanied by herself. Patient was referred by  Carlyon Shadow NP for mental health. Patient reports the following symptoms/concerns: She reports that she will moving to [place] with her son once she gets her money from disability. She reports that she has not been feeling as down and depressed as she once was. She reports that she is still trying to quit smoking and wears nicotine patches sometimes. She reports that she had one drink and a two beers on Saturday. She notes that she hadn't drank liquor in a few weeks prior to Saturday. She explains that the issue with COVID 19 and staying home is all she does is eat, sleep, and watch TV. She reports that she attends virtual services for her church from home. She denies suicidal and homicidal thoughts.  Duration of problem: ; Severity of problem: mild  OBJECTIVE: Mood: Euthymic and Affect: Appropriate Risk of harm to self or others: No plan to harm self or others  LIFE CONTEXT: Family and Social: See above. School/Work: See above. Self-Care: See above. Life Changes: See above.  GOALS ADDRESSED: Patient will: 1.  Reduce symptoms of: depression  2.  Increase knowledge and/or ability of: healthy habits  3.  Demonstrate ability to: Increase healthy adjustment to current life circumstances  INTERVENTIONS: Interventions utilized:  Brief CBT was utilized by the clinician focusing on the patient's mood and affect on behavior. Clinician processed with the client regarding how she has been doing since the last follow up session. Clinician processed with the patient regarding how her overall mood has been. Clinician explained to the patient that she knows that she is ready to  get into a place of her own with her son. Clinician explained to the patient that she is not sure of a free clinic in [place] for the uninsured but she can ask if the coordinator at [facility] knows of one. Clinician explained to the patient that she has better alternatives to drinking and smoking cigarettes in terms of coping skills. Clinician encouraged the patient to continue to utilize her support service and attend virtual sermons at her church weekly.  Standardized Assessments completed: GAD-7 and PHQ 9  ASSESSMENT: Patient currently experiencing see above.   Patient may benefit from see above.  PLAN: 1. Follow up with behavioral health clinician on : one month or earlier if needed. 2. Behavioral recommendations: see above. 3. Referral(s): [facility] (In Clinic) 4. "From scale of 1-10, how likely are you to follow plan?":   Bayard Hugger, LCSW

## 2019-03-08 ENCOUNTER — Other Ambulatory Visit: Payer: Self-pay

## 2019-03-09 ENCOUNTER — Ambulatory Visit: Payer: Self-pay | Admitting: Gerontology

## 2019-03-13 ENCOUNTER — Other Ambulatory Visit: Payer: Self-pay | Admitting: Gerontology

## 2019-03-13 DIAGNOSIS — E119 Type 2 diabetes mellitus without complications: Secondary | ICD-10-CM

## 2019-03-13 DIAGNOSIS — I10 Essential (primary) hypertension: Secondary | ICD-10-CM

## 2019-03-13 DIAGNOSIS — E1159 Type 2 diabetes mellitus with other circulatory complications: Secondary | ICD-10-CM

## 2019-03-23 ENCOUNTER — Other Ambulatory Visit: Payer: Medicaid Other

## 2019-03-23 ENCOUNTER — Other Ambulatory Visit: Payer: Self-pay

## 2019-03-23 DIAGNOSIS — E119 Type 2 diabetes mellitus without complications: Secondary | ICD-10-CM

## 2019-03-23 DIAGNOSIS — N393 Stress incontinence (female) (male): Secondary | ICD-10-CM

## 2019-03-23 DIAGNOSIS — I10 Essential (primary) hypertension: Secondary | ICD-10-CM

## 2019-03-24 ENCOUNTER — Other Ambulatory Visit: Payer: Self-pay | Admitting: Gerontology

## 2019-03-24 DIAGNOSIS — Z8719 Personal history of other diseases of the digestive system: Secondary | ICD-10-CM

## 2019-03-24 LAB — CBC WITH DIFFERENTIAL/PLATELET
Basophils Absolute: 0.1 10*3/uL (ref 0.0–0.2)
Basos: 1 %
EOS (ABSOLUTE): 0.1 10*3/uL (ref 0.0–0.4)
Eos: 2 %
Hematocrit: 36.3 % (ref 34.0–46.6)
Hemoglobin: 12.3 g/dL (ref 11.1–15.9)
Immature Grans (Abs): 0 10*3/uL (ref 0.0–0.1)
Immature Granulocytes: 0 %
Lymphocytes Absolute: 2.6 10*3/uL (ref 0.7–3.1)
Lymphs: 42 %
MCH: 29.9 pg (ref 26.6–33.0)
MCHC: 33.9 g/dL (ref 31.5–35.7)
MCV: 88 fL (ref 79–97)
Monocytes Absolute: 0.4 10*3/uL (ref 0.1–0.9)
Monocytes: 6 %
Neutrophils Absolute: 2.9 10*3/uL (ref 1.4–7.0)
Neutrophils: 49 %
Platelets: 293 10*3/uL (ref 150–450)
RBC: 4.11 x10E6/uL (ref 3.77–5.28)
RDW: 13.9 % (ref 11.7–15.4)
WBC: 6.1 10*3/uL (ref 3.4–10.8)

## 2019-03-24 LAB — URINALYSIS
Bilirubin, UA: NEGATIVE
Glucose, UA: NEGATIVE
Ketones, UA: NEGATIVE
Leukocytes,UA: NEGATIVE
Nitrite, UA: NEGATIVE
Protein,UA: NEGATIVE
RBC, UA: NEGATIVE
Specific Gravity, UA: 1.022 (ref 1.005–1.030)
Urobilinogen, Ur: 1 mg/dL (ref 0.2–1.0)
pH, UA: 6.5 (ref 5.0–7.5)

## 2019-03-24 LAB — COMPREHENSIVE METABOLIC PANEL
ALT: 17 IU/L (ref 0–32)
AST: 14 IU/L (ref 0–40)
Albumin/Globulin Ratio: 1.4 (ref 1.2–2.2)
Albumin: 4.2 g/dL (ref 3.8–4.8)
Alkaline Phosphatase: 82 IU/L (ref 39–117)
BUN/Creatinine Ratio: 18 (ref 12–28)
BUN: 20 mg/dL (ref 8–27)
Bilirubin Total: 0.2 mg/dL (ref 0.0–1.2)
CO2: 25 mmol/L (ref 20–29)
Calcium: 10 mg/dL (ref 8.7–10.3)
Chloride: 99 mmol/L (ref 96–106)
Creatinine, Ser: 1.09 mg/dL — ABNORMAL HIGH (ref 0.57–1.00)
GFR calc Af Amer: 63 mL/min/{1.73_m2} (ref >59)
GFR calc non Af Amer: 55 mL/min/{1.73_m2} — ABNORMAL LOW (ref >59)
Globulin, Total: 3 g/dL (ref 1.5–4.5)
Glucose: 120 mg/dL — ABNORMAL HIGH (ref 65–99)
Potassium: 3.9 mmol/L (ref 3.5–5.2)
Sodium: 138 mmol/L (ref 134–144)
Total Protein: 7.2 g/dL (ref 6.0–8.5)

## 2019-03-24 LAB — LIPID PANEL
Chol/HDL Ratio: 2.5 ratio (ref 0.0–4.4)
Cholesterol, Total: 174 mg/dL (ref 100–199)
HDL: 69 mg/dL (ref >39)
LDL Calculated: 72 mg/dL (ref 0–99)
Triglycerides: 164 mg/dL — ABNORMAL HIGH (ref 0–149)
VLDL Cholesterol Cal: 33 mg/dL (ref 5–40)

## 2019-03-24 LAB — HEMOGLOBIN A1C
Est. average glucose Bld gHb Est-mCnc: 151 mg/dL
Hgb A1c MFr Bld: 6.9 % — ABNORMAL HIGH (ref 4.8–5.6)

## 2019-03-29 ENCOUNTER — Other Ambulatory Visit: Payer: Self-pay

## 2019-03-29 ENCOUNTER — Ambulatory Visit: Payer: Medicaid Other | Admitting: Gerontology

## 2019-03-29 DIAGNOSIS — I10 Essential (primary) hypertension: Secondary | ICD-10-CM

## 2019-03-29 DIAGNOSIS — E119 Type 2 diabetes mellitus without complications: Secondary | ICD-10-CM

## 2019-03-29 DIAGNOSIS — R899 Unspecified abnormal finding in specimens from other organs, systems and tissues: Secondary | ICD-10-CM | POA: Insufficient documentation

## 2019-03-29 NOTE — Patient Instructions (Signed)
Carbohydrate Counting for Diabetes Mellitus, Adult  Carbohydrate counting is a method of keeping track of how many carbohydrates you eat. Eating carbohydrates naturally increases the amount of sugar (glucose) in the blood. Counting how many carbohydrates you eat helps keep your blood glucose within normal limits, which helps you manage your diabetes (diabetes mellitus). It is important to know how many carbohydrates you can safely have in each meal. This is different for every person. A diet and nutrition specialist (registered dietitian) can help you make a meal plan and calculate how many carbohydrates you should have at each meal and snack. Carbohydrates are found in the following foods:  Grains, such as breads and cereals.  Dried beans and soy products.  Starchy vegetables, such as potatoes, peas, and corn.  Fruit and fruit juices.  Milk and yogurt.  Sweets and snack foods, such as cake, cookies, candy, chips, and soft drinks. How do I count carbohydrates? There are two ways to count carbohydrates in food. You can use either of the methods or a combination of both. Reading "Nutrition Facts" on packaged food The "Nutrition Facts" list is included on the labels of almost all packaged foods and beverages in the [place] It includes:  The serving size.  Information about nutrients in each serving, including the grams (g) of carbohydrate per serving. To use the "Nutrition Facts":  Decide how many servings you will have.  Multiply the number of servings by the number of carbohydrates per serving.  The resulting number is the total amount of carbohydrates that you will be having. Learning standard serving sizes of other foods When you eat carbohydrate foods that are not packaged or do not include "Nutrition Facts" on the label, you need to measure the servings in order to count the amount of carbohydrates:  Measure the foods that you will eat with a food scale or measuring cup, if needed.   Decide how many standard-size servings you will eat.  Multiply the number of servings by 15. Most carbohydrate-rich foods have about 15 g of carbohydrates per serving. ? For example, if you eat 8 oz (170 g) of strawberries, you will have eaten 2 servings and 30 g of carbohydrates (2 servings x 15 g = 30 g).  For foods that have more than one food mixed, such as soups and casseroles, you must count the carbohydrates in each food that is included. The following list contains standard serving sizes of common carbohydrate-rich foods. Each of these servings has about 15 g of carbohydrates:   hamburger bun or  English muffin.   oz (15 mL) syrup.   oz (14 g) jelly.  1 slice of bread.  1 six-inch tortilla.  3 oz (85 g) cooked rice or pasta.  4 oz (113 g) cooked dried beans.  4 oz (113 g) starchy vegetable, such as peas, corn, or potatoes.  4 oz (113 g) hot cereal.  4 oz (113 g) mashed potatoes or  of a large baked potato.  4 oz (113 g) canned or frozen fruit.  4 oz (120 mL) fruit juice.  4-6 crackers.  6 chicken nuggets.  6 oz (170 g) unsweetened dry cereal.  6 oz (170 g) plain fat-free yogurt or yogurt sweetened with artificial sweeteners.  8 oz (240 mL) milk.  8 oz (170 g) fresh fruit or one small piece of fruit.  24 oz (680 g) popped popcorn. Example of carbohydrate counting Sample meal  3 oz (85 g) chicken breast.  6 oz (170 g)   brown rice.  4 oz (113 g) corn.  8 oz (240 mL) milk.  8 oz (170 g) strawberries with sugar-free whipped topping. Carbohydrate calculation 1. Identify the foods that contain carbohydrates: ? Rice. ? Corn. ? Milk. ? Strawberries. 2. Calculate how many servings you have of each food: ? 2 servings rice. ? 1 serving corn. ? 1 serving milk. ? 1 serving strawberries. 3. Multiply each number of servings by 15 g: ? 2 servings rice x 15 g = 30 g. ? 1 serving corn x 15 g = 15 g. ? 1 serving milk x 15 g = 15 g. ? 1 serving  strawberries x 15 g = 15 g. 4. Add together all of the amounts to find the total grams of carbohydrates eaten: ? 30 g + 15 g + 15 g + 15 g = 75 g of carbohydrates total. Summary  Carbohydrate counting is a method of keeping track of how many carbohydrates you eat.  Eating carbohydrates naturally increases the amount of sugar (glucose) in the blood.  Counting how many carbohydrates you eat helps keep your blood glucose within normal limits, which helps you manage your diabetes.  A diet and nutrition specialist (registered dietitian) can help you make a meal plan and calculate how many carbohydrates you should have at each meal and snack. This information is not intended to replace advice given to you by your health care provider. Make sure you discuss any questions you have with your health care provider. Document Released: 08/31/2005 Document Revised: 03/25/2017 Document Reviewed: 02/12/2016 Elsevier Patient Education  2020 [employer] DASH Eating Plan DASH stands for "Dietary Approaches to Stop Hypertension." The DASH eating plan is a healthy eating plan that has been shown to reduce high blood pressure (hypertension). It may also reduce your risk for type 2 diabetes, heart disease, and stroke. The DASH eating plan may also help with weight loss. What are tips for following this plan?  General guidelines  Avoid eating more than 2,300 mg (milligrams) of salt (sodium) a day. If you have hypertension, you may need to reduce your sodium intake to 1,500 mg a day.  Limit alcohol intake to no more than 1 drink a day for nonpregnant women and 2 drinks a day for men. One drink equals 12 oz of beer, 5 oz of wine, or 1 oz of hard liquor.  Work with your health care provider to maintain a healthy body weight or to lose weight. Ask what an ideal weight is for you.  Get at least 30 minutes of exercise that causes your heart to beat faster (aerobic exercise) most days of the week. Activities may  include walking, swimming, or biking.  Work with your health care provider or diet and nutrition specialist (dietitian) to adjust your eating plan to your individual calorie needs. Reading food labels   Check food labels for the amount of sodium per serving. Choose foods with less than 5 percent of the Daily Value of sodium. Generally, foods with less than 300 mg of sodium per serving fit into this eating plan.  To find whole grains, look for the word "whole" as the first word in the ingredient list. Shopping  Buy products labeled as "low-sodium" or "no salt added."  Buy fresh foods. Avoid canned foods and premade or frozen meals. Cooking  Avoid adding salt when cooking. Use salt-free seasonings or herbs instead of table salt or sea salt. Check with your health care provider or pharmacist before using salt substitutes.    Do not fry foods. Cook foods using healthy methods such as baking, boiling, grilling, and broiling instead.  Cook with heart-healthy oils, such as olive, canola, soybean, or sunflower oil. Meal planning  Eat a balanced diet that includes: ? 5 or more servings of fruits and vegetables each day. At each meal, try to fill half of your plate with fruits and vegetables. ? Up to 6-8 servings of whole grains each day. ? Less than 6 oz of lean meat, poultry, or fish each day. A 3-oz serving of meat is about the same size as a deck of cards. One egg equals 1 oz. ? 2 servings of low-fat dairy each day. ? A serving of nuts, seeds, or beans 5 times each week. ? Heart-healthy fats. Healthy fats called Omega-3 fatty acids are found in foods such as flaxseeds and coldwater fish, like sardines, salmon, and mackerel.  Limit how much you eat of the following: ? Canned or prepackaged foods. ? Food that is high in trans fat, such as fried foods. ? Food that is high in saturated fat, such as fatty meat. ? Sweets, desserts, sugary drinks, and other foods with added sugar. ? Full-fat  dairy products.  Do not salt foods before eating.  Try to eat at least 2 vegetarian meals each week.  Eat more home-cooked food and less restaurant, buffet, and fast food.  When eating at a restaurant, ask that your food be prepared with less salt or no salt, if possible. What foods are recommended? The items listed may not be a complete list. Talk with your dietitian about what dietary choices are best for you. Grains Whole-grain or whole-wheat bread. Whole-grain or whole-wheat pasta. Brown rice. Oatmeal. Quinoa. Bulgur. Whole-grain and low-sodium cereals. Pita bread. Low-fat, low-sodium crackers. Whole-wheat flour tortillas. Vegetables Fresh or frozen vegetables (raw, steamed, roasted, or grilled). Low-sodium or reduced-sodium tomato and vegetable juice. Low-sodium or reduced-sodium tomato sauce and tomato paste. Low-sodium or reduced-sodium canned vegetables. Fruits All fresh, dried, or frozen fruit. Canned fruit in natural juice (without added sugar). Meat and other protein foods Skinless chicken or turkey. Ground chicken or turkey. Pork with fat trimmed off. Fish and seafood. Egg whites. Dried beans, peas, or lentils. Unsalted nuts, nut butters, and seeds. Unsalted canned beans. Lean cuts of beef with fat trimmed off. Low-sodium, lean deli meat. Dairy Low-fat (1%) or fat-free (skim) milk. Fat-free, low-fat, or reduced-fat cheeses. Nonfat, low-sodium ricotta or cottage cheese. Low-fat or nonfat yogurt. Low-fat, low-sodium cheese. Fats and oils Soft margarine without trans fats. Vegetable oil. Low-fat, reduced-fat, or light mayonnaise and salad dressings (reduced-sodium). Canola, safflower, olive, soybean, and sunflower oils. Avocado. Seasoning and other foods Herbs. Spices. Seasoning mixes without salt. Unsalted popcorn and pretzels. Fat-free sweets. What foods are not recommended? The items listed may not be a complete list. Talk with your dietitian about what dietary choices are best  for you. Grains Baked goods made with fat, such as croissants, muffins, or some breads. Dry pasta or rice meal packs. Vegetables Creamed or fried vegetables. Vegetables in a cheese sauce. Regular canned vegetables (not low-sodium or reduced-sodium). Regular canned tomato sauce and paste (not low-sodium or reduced-sodium). Regular tomato and vegetable juice (not low-sodium or reduced-sodium). Pickles. Olives. Fruits Canned fruit in a light or heavy syrup. Fried fruit. Fruit in cream or butter sauce. Meat and other protein foods Fatty cuts of meat. Ribs. Fried meat. Bacon. Sausage. Bologna and other processed lunch meats. Salami. Fatback. Hotdogs. Bratwurst. Salted nuts and seeds. Canned beans with   added salt. Canned or smoked fish. Whole eggs or egg yolks. Chicken or turkey with skin. Dairy Whole or 2% milk, cream, and half-and-half. Whole or full-fat cream cheese. Whole-fat or sweetened yogurt. Full-fat cheese. Nondairy creamers. Whipped toppings. Processed cheese and cheese spreads. Fats and oils Butter. Stick margarine. Lard. Shortening. Ghee. Bacon fat. Tropical oils, such as coconut, palm kernel, or palm oil. Seasoning and other foods Salted popcorn and pretzels. Onion salt, garlic salt, seasoned salt, table salt, and sea salt. Worcestershire sauce. Tartar sauce. Barbecue sauce. Teriyaki sauce. Soy sauce, including reduced-sodium. Steak sauce. Canned and packaged gravies. Fish sauce. Oyster sauce. Cocktail sauce. Horseradish that you find on the shelf. Ketchup. Mustard. Meat flavorings and tenderizers. Bouillon cubes. Hot sauce and Tabasco sauce. Premade or packaged marinades. Premade or packaged taco seasonings. Relishes. Regular salad dressings. Where to find more information:  National Heart, Lung, and Blood Institute: www.nhlbi.nih.gov  American Heart Association: www.heart.org Summary  The DASH eating plan is a healthy eating plan that has been shown to reduce high blood pressure  (hypertension). It may also reduce your risk for type 2 diabetes, heart disease, and stroke.  With the DASH eating plan, you should limit salt (sodium) intake to 2,300 mg a day. If you have hypertension, you may need to reduce your sodium intake to 1,500 mg a day.  When on the DASH eating plan, aim to eat more fresh fruits and vegetables, whole grains, lean proteins, low-fat dairy, and heart-healthy fats.  Work with your health care provider or diet and nutrition specialist (dietitian) to adjust your eating plan to your individual calorie needs. This information is not intended to replace advice given to you by your health care provider. Make sure you discuss any questions you have with your health care provider. Document Released: 08/20/2011 Document Revised: 08/13/2017 Document Reviewed: 08/24/2016 Elsevier Patient Education  2020 Elsevier Inc.  
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
## 2019-04-03 ENCOUNTER — Ambulatory Visit: Payer: Self-pay | Admitting: Urology

## 2019-04-04 ENCOUNTER — Telehealth: Payer: Self-pay | Admitting: Licensed Clinical Social Worker

## 2019-04-04 ENCOUNTER — Ambulatory Visit: Payer: Self-pay | Admitting: Licensed Clinical Social Worker

## 2019-04-04 NOTE — Telephone Encounter (Signed)
Clinician attempted to contact the patient but a message popped up stating that the subscriber is not in service at this point in time and there was not an option to leave a voicemail.

## 2019-04-06 ENCOUNTER — Telehealth: Payer: Self-pay | Admitting: Pharmacy Technician

## 2019-04-06 NOTE — Telephone Encounter (Signed)
Per [facility], patient has Medicaid with prescription drug coverage.  No longer meets [facility]'s eligibility criteria.  Patient notified.  [facility]

## 2019-05-14 ENCOUNTER — Encounter: Payer: Self-pay | Admitting: Emergency Medicine

## 2019-05-14 ENCOUNTER — Other Ambulatory Visit: Payer: Self-pay

## 2019-05-14 ENCOUNTER — Ambulatory Visit
Admission: EM | Admit: 2019-05-14 | Discharge: 2019-05-14 | Disposition: A | Discharge status: Home / Self Care | Payer: Medicaid Other | Attending: Emergency Medicine | Admitting: Emergency Medicine

## 2019-05-14 DIAGNOSIS — Z8719 Personal history of other diseases of the digestive system: Secondary | ICD-10-CM

## 2019-05-14 DIAGNOSIS — Z760 Encounter for issue of repeat prescription: Secondary | ICD-10-CM

## 2019-05-14 DIAGNOSIS — E1159 Type 2 diabetes mellitus with other circulatory complications: Secondary | ICD-10-CM

## 2019-05-14 DIAGNOSIS — I152 Hypertension secondary to endocrine disorders: Secondary | ICD-10-CM

## 2019-05-14 DIAGNOSIS — I10 Essential (primary) hypertension: Secondary | ICD-10-CM

## 2019-05-14 DIAGNOSIS — Z789 Other specified health status: Secondary | ICD-10-CM

## 2019-05-14 DIAGNOSIS — E119 Type 2 diabetes mellitus without complications: Secondary | ICD-10-CM

## 2019-05-14 MED ORDER — LOSARTAN POTASSIUM 50 MG PO TABS: 50.0000 mg | ORAL_TABLET | Freq: Every day | ORAL | 0 refills | Status: DC

## 2019-05-14 MED ORDER — METFORMIN HCL 1000 MG PO TABS: ORAL_TABLET | ORAL | 0 refills | Status: DC

## 2019-05-14 MED ORDER — CHLORTHALIDONE 50 MG PO TABS: 50.0000 mg | ORAL_TABLET | Freq: Every day | ORAL | 0 refills | Status: DC

## 2019-05-14 MED ORDER — CETIRIZINE HCL 10 MG PO TABS: 10.0000 mg | ORAL_TABLET | Freq: Every day | ORAL | 0 refills | Status: DC

## 2019-05-14 MED ORDER — OMEPRAZOLE 20 MG PO CPDR: 20.0000 mg | DELAYED_RELEASE_CAPSULE | Freq: Every day | ORAL | 0 refills | Status: DC

## 2019-05-14 NOTE — Discharge Instructions (Addendum)
Take all your chronic medications as prescribed. Return for chest pain, difficulty breathing, severe headache, dizziness.

## 2019-05-14 NOTE — ED Provider Notes (Signed)
[facility]    CSN: [id number] Arrival date & time: 05/14/19  1348      History   Chief Complaint Chief Complaint  Patient presents with  . Medication Refill    HPI Alexandra Campos is a 63 y.o. female with history of diabetes, hypertension, arthritis presenting for medication refills.  Patient states that she has been out of her chlorthalidone for the last 7 days.  [facility] to [facility] a [facility] [place], [place] ([facility]), though due to recent change in insurance status no longer qualifies for the services.  Patient looking to establish care.  Reports compliance with all medications, running low on her losartan, metformin, omeprazole, cetirizine.  Patient is able to verbalize dosing, regimen, indication for each medication.   Past Medical History:  Diagnosis Date  . Arthritis   . Diabetes mellitus without complication ([place])   . Hypertension     Patient Active Problem List   Diagnosis Date Noted  . Abnormal laboratory test result 03/29/2019  . Diabetes mellitus without complication ([place]) [id number]  . Hypertension 08/04/2018  . Tobacco dependence 08/04/2018    Past Surgical History:  Procedure Laterality Date  . ANKLE SURGERY Right   . INDUCED ABORTION    . LYMPHADENECTOMY      OB History   No obstetric history on file.      Home Medications    Prior to Admission medications   Medication Sig Start Date End Date Taking? Authorizing Provider  cetirizine (ZYRTEC) 10 MG tablet Take 1 tablet (10 mg total) by mouth daily. 05/14/19   Hall-Potvin, [place], PA-C  chlorthalidone (HYGROTON) 50 MG tablet Take 1 tablet (50 mg total) by mouth daily. 05/14/19   Hall-Potvin, [place], PA-C  ibuprofen (ADVIL,MOTRIN) 200 MG tablet Take 2 tablets (400 mg total) by mouth every 6 (six) hours as needed. 07/07/18   Iloabachie, Chioma E, NP  losartan (COZAAR) 50 MG tablet Take 1 tablet (50 mg total) by mouth daily. 05/14/19   Hall-Potvin, [place],  PA-C  metFORMIN (GLUCOPHAGE) 1000 MG tablet TAKE ONE TABLET BY MOUTH 2 TIMES A DAY WITH MEALS 05/14/19   Hall-Potvin, [place], PA-C  nicotine (NICODERM CQ - DOSED IN MG/24 HR) 7 mg/24hr patch Place 1 patch (7 mg total) onto the skin daily. Patient not taking: Reported on 11/21/2018 08/04/18   Doles-Johnson, Teah, NP  omeprazole (PRILOSEC) 20 MG capsule Take 1 capsule (20 mg total) by mouth daily. 05/14/19   Hall-Potvin, [place], PA-C    Family History Family History  Problem Relation Age of Onset  . Diabetes Mother   . Hypertension Mother   . Diabetes Father   . Hypertension Father   . Cancer Father   . Diabetes Brother   . Hypertension Brother   . Diabetes Daughter   . Hypertension Daughter   . Cancer Paternal Uncle   . Diabetes Maternal Grandmother   . Hypertension Maternal Grandmother     Social History Social History   Tobacco Use  . Smoking status: Current Every Day Smoker    Packs/day: 1.00    Types: Cigarettes  . Smokeless tobacco: Never Used  . Tobacco comment: knows she needs to quit  Substance Use Topics  . Alcohol use: Yes    Alcohol/week: 5.0 standard drinks    Types: 5 Cans of beer per week    Comment: 5 drinks/week   . Drug use: Not Currently    Types: Marijuana    Comment: for pain  Allergies   Patient has no known allergies.   Review of Systems Review of Systems  Constitutional: Negative for fatigue and fever.  HENT: Negative for ear pain, sinus pain, sore throat and voice change.   Eyes: Negative for pain, redness and visual disturbance.  Respiratory: Negative for cough and shortness of breath.   Cardiovascular: Negative for chest pain and palpitations.  Gastrointestinal: Negative for abdominal pain, diarrhea and vomiting.  Musculoskeletal: Negative for arthralgias and myalgias.  Skin: Negative for rash and wound.  Neurological: Negative for syncope and headaches.     Physical Exam Triage Vital Signs ED Triage Vitals  Enc Vitals Group      BP      Pulse      Resp      Temp      Temp src      SpO2      Weight      Height      Head Circumference      Peak Flow      Pain Score      Pain Loc      Pain Edu?      Excl. in [place]?    No data found.  Updated Vital Signs BP (!) 187/87 (BP Location: Left Arm)   Pulse 75   Temp 98.5 F (36.9 C) (Oral)   Resp 20   SpO2 96%   Visual Acuity Right Eye Distance:   Left Eye Distance:   Bilateral Distance:    Right Eye Near:   Left Eye Near:    Bilateral Near:     Physical Exam Constitutional:      General: She is not in acute distress.    Appearance: She is obese.  HENT:     Head: Normocephalic and atraumatic.     Mouth/Throat:     Mouth: Mucous membranes are moist.     Pharynx: Oropharynx is clear. No oropharyngeal exudate or posterior oropharyngeal erythema.  Eyes:     General: No scleral icterus.    Pupils: Pupils are equal, round, and reactive to light.  Cardiovascular:     Rate and Rhythm: Normal rate and regular rhythm.     Heart sounds: No murmur. No gallop.   Pulmonary:     Effort: Pulmonary effort is normal. No respiratory distress.     Breath sounds: No wheezing, rhonchi or rales.  Skin:    Coloration: Skin is not jaundiced or pale.  Neurological:     General: No focal deficit present.     Mental Status: She is alert and oriented to person, place, and time.     Cranial Nerves: No cranial nerve deficit.     Motor: No weakness.     Gait: Gait normal.     Deep Tendon Reflexes: Reflexes normal.  Psychiatric:        Mood and Affect: Mood normal.        Behavior: Behavior normal.        Thought Content: Thought content normal.      UC Treatments / Results  Labs (all labs ordered are listed, but only abnormal results are displayed) Labs Reviewed - No data to display  EKG   Radiology No results found.  Procedures Procedures (including critical care time)  Medications Ordered in UC Medications - No data to display  Initial  Impression / Assessment and Plan / UC Course  I have reviewed the triage vital signs and the nursing notes.  Pertinent labs & imaging results  that were available during my care of the patient were reviewed by me and considered in my medical decision making (see chart for details).     1.  Hypertension Patient asymptomatic, no neurocognitive, cardiopulmonary abnormalities on exam.  Refills provided list as below: Patient states she is able to afford her medications today.  Contact information given for primary care provider.  Return precautions discussed, patient verbalized understanding and is agreeable to plan. Final Clinical Impressions(s) / UC Diagnoses   Final diagnoses:  Medication refill  Essential hypertension     Discharge Instructions     Take all your chronic medications as prescribed. Return for chest pain, difficulty breathing, severe headache, dizziness.    ED Prescriptions    Medication Sig Dispense Auth. Provider   cetirizine (ZYRTEC) 10 MG tablet Take 1 tablet (10 mg total) by mouth daily. 60 tablet Hall-Potvin, [place], PA-C   chlorthalidone (HYGROTON) 50 MG tablet Take 1 tablet (50 mg total) by mouth daily. 30 tablet Hall-Potvin, [place], PA-C   losartan (COZAAR) 50 MG tablet Take 1 tablet (50 mg total) by mouth daily. 90 tablet Hall-Potvin, [place], PA-C   omeprazole (PRILOSEC) 20 MG capsule Take 1 capsule (20 mg total) by mouth daily. 90 capsule Hall-Potvin, [place], PA-C   metFORMIN (GLUCOPHAGE) 1000 MG tablet TAKE ONE TABLET BY MOUTH 2 TIMES A DAY WITH MEALS 120 tablet Hall-Potvin, [place], PA-C     Controlled Substance Prescriptions [place] Controlled Substance Registry consulted? Not Applicable   Quincy Sheehan, [place] 05/14/19 1432

## 2019-05-14 NOTE — ED Triage Notes (Signed)
Per pt she has been seeing a clinic in [place] and moved up here. No PCP and needs her bp medication refilled. has NOT HAD her chlorthalidone

## 2019-06-14 ENCOUNTER — Ambulatory Visit (INDEPENDENT_AMBULATORY_CARE_PROVIDER_SITE_OTHER): Payer: Medicaid Other | Admitting: Family Medicine

## 2019-06-14 ENCOUNTER — Other Ambulatory Visit: Payer: Self-pay

## 2019-06-14 ENCOUNTER — Encounter: Payer: Self-pay | Admitting: Family Medicine

## 2019-06-14 VITALS — BP 130/80 | HR 72 | Ht 60.0 in | Wt 217.5 lb

## 2019-06-14 DIAGNOSIS — M2559 Pain in other specified joint: Secondary | ICD-10-CM

## 2019-06-14 DIAGNOSIS — G5603 Carpal tunnel syndrome, bilateral upper limbs: Secondary | ICD-10-CM | POA: Diagnosis not present

## 2019-06-14 DIAGNOSIS — E119 Type 2 diabetes mellitus without complications: Secondary | ICD-10-CM

## 2019-06-14 DIAGNOSIS — Z114 Encounter for screening for human immunodeficiency virus [HIV]: Secondary | ICD-10-CM | POA: Diagnosis not present

## 2019-06-14 DIAGNOSIS — N182 Chronic kidney disease, stage 2 (mild): Secondary | ICD-10-CM | POA: Diagnosis not present

## 2019-06-14 DIAGNOSIS — Z6841 Body Mass Index (BMI) 40.0 and over, adult: Secondary | ICD-10-CM

## 2019-06-14 DIAGNOSIS — Z1159 Encounter for screening for other viral diseases: Secondary | ICD-10-CM

## 2019-06-14 DIAGNOSIS — F17200 Nicotine dependence, unspecified, uncomplicated: Secondary | ICD-10-CM

## 2019-06-14 DIAGNOSIS — Z0000 Encounter for general adult medical examination without abnormal findings: Secondary | ICD-10-CM

## 2019-06-14 DIAGNOSIS — I10 Essential (primary) hypertension: Secondary | ICD-10-CM

## 2019-06-14 DIAGNOSIS — Z23 Encounter for immunization: Secondary | ICD-10-CM

## 2019-06-14 DIAGNOSIS — R0602 Shortness of breath: Secondary | ICD-10-CM

## 2019-06-14 DIAGNOSIS — Z1239 Encounter for other screening for malignant neoplasm of breast: Secondary | ICD-10-CM

## 2019-06-14 LAB — POCT GLYCOSYLATED HEMOGLOBIN (HGB A1C): HbA1c, POC (controlled diabetic range): 7.3 % — AB (ref 0.0–7.0)

## 2019-06-14 MED ORDER — METFORMIN HCL 1000 MG PO TABS: 1000.0000 mg | ORAL_TABLET | Freq: Two times a day (BID) | ORAL | 0 refills | Status: DC

## 2019-06-14 MED ORDER — ALBUTEROL SULFATE HFA 108 (90 BASE) MCG/ACT IN AERS: 2.0000 | INHALATION_SPRAY | Freq: Four times a day (QID) | RESPIRATORY_TRACT | 1 refills | Status: DC | PRN

## 2019-06-14 MED ORDER — ATORVASTATIN CALCIUM 20 MG PO TABS: 20.0000 mg | ORAL_TABLET | Freq: Every day | ORAL | 3 refills | Status: DC

## 2019-06-14 MED ORDER — BUPROPION HCL ER (XL) 150 MG PO TB24: ORAL_TABLET | ORAL | 0 refills | Status: DC

## 2019-06-14 NOTE — Progress Notes (Signed)
New Patient Visit Subjective  Subjective:  Patient ID: MRN [id number]  Date of birth: 07-21-1956  PCP: Wilber Oliphant, MD  CC: Establish care + Review    HPI Alexandra Campos is a 63 y.o. female who presents today to establish care. She also complains of the following:   # Hand Pain   Right worse than left. Feels like neuropathy. Is not tingling all the time, but tingling more than before and especially at night time.   HISTORY Reviewed with patient and updated in EMR as appropriate.  Allergies: None   Medications:  Zyrtec,  Had patches, but had diarrhea and stopped taking after a week.  Chlorithalidone 25mg  -- taking 12.5 mg  Losartan 50 mg  Omeprazole 20 mg  Metformin 1000mg  BID   Past Medical History: Carpal tunnel, [place], DM, HTN Past Surgical History: Ankle sg, induced abortion, lymphoidectomy   Psych history: Never hospitalized. Daughter passed away while in hospital. (Daughter passed in 2013 when she was 22 (Daughter had a 12-Dec-2022 AM -- died from [place] during complicated labor) Stopped seeing a therapist from  June 2019 July 2020. Would like to get hooked back up with one if needed. Doesn't think she needs at this time.   OB/Gyn [id number]  LMP: No LMP recorded. Patient is postmenopausal.  Health Maintenance:  Colonoscopy - needs, wants other mode of testing. Unsure if any hx of colon cancer  Vaccines - needs - needs Pap Smear - needs  Mammogram - needs  Eye doctor: Referral today  Dentist: none - needs    Family History Leira's family history includes Cancer in her father and paternal uncle; Diabetes in her brother, daughter, father, maternal grandmother, and mother; Hypertension in her brother, daughter, father, maternal grandmother, and mother.   Social History  Santina's  reports that she has been smoking cigarettes. She has been smoking about 1.00 pack per day. She has never used smokeless tobacco. She reports current alcohol use of about 5.0 standard drinks  of alcohol per week. She reports previous drug use. Drug: Marijuana..  Social History   Social History Narrative   Information obtained from New patient questionnaire   Employment: No    Highest level of education:  Some high school    At home:  Her and her son    Transportation:  friend   Religious or person beliefs that affect healthcare: no   Exercise regularly: Trying to do exercise more with    Tobacco use: yes, 27 ypack years (underestimate)   Recreational drug use:  no   ETOH use:  no   Sexually active: no    At risk of having STD:  no   Feel safe in relationship:  n/a   For fun:  Listen to music, watch tv, not much since COVID      Review of Systems  Constitutional: Negative.   HENT: Negative.   Eyes: Positive for blurred vision. Negative for pain.  Respiratory: Positive for shortness of breath. Negative for wheezing.   Cardiovascular: Negative for chest pain, palpitations, orthopnea and leg swelling.  Gastrointestinal: Negative.  Negative for heartburn.  Genitourinary: Negative.   Musculoskeletal: Positive for joint pain.  Skin: Negative.   Neurological: Negative.   Psychiatric/Behavioral: Negative.    Objective:  Physical Exam:  BP 130/80   Pulse 72   Ht 5' (1.524 m)   Wt 217 lb 8 oz (98.7 kg)   SpO2 98%   BMI 42.48 kg/m   Gen: NAD, alert,  non-toxic, pleasant, obese African American female.  HEENT: Normocephaic, atraumatic. PERRLA, clear conjuctiva, no scleral icterus and injection. Normal EOM.  Hearing intact. TM pearly grey bilaterally with no fluid. Neck supple with no LAD, nodules, or gross abnormality.  Nares patent with no discharge.  Oropharynx without erythema and lesions.  Tonsils nonswollen and without exudate.   CV: Regular rate and rhythm.  Normal S1-S2.  No murmur, gallops, S3, S4 appreciated.  Normal capillary refill bilaterally.  Radial pulses 2+ bilaterally. No bilateral lower extremity edema. Resp: Clear to auscultation bilaterally.  No  wheezing, rales, rhonchi, or other abnormal lung sounds.  No increased work of breathing appreciated. Bronchitic cough.  Abd: Nontender and nondistended on palpation to all 4 quadrants.  Positive bowel sounds. Skin: No obvious rashes, lesions, or trauma.  Normal turgor.  MSK: Normal ROM. Normal strength and tone.  Neuro: Cranial nerves II through VI grossly intact. Gait normal.  Alert and oriented x4.  No obvious abnormal movements. Psych: Cooperative with exam.  Normal speech. Pleasant. Makes good eye contact. Genitourinary: deferred.   Pertinent Labs & Imaging:  The 10-year ASCVD risk score Mikey Bussing DC Jr., et al., 2013) is: 25.4%   Values used to calculate the score:     Age: 74 years     Sex: Female     Is Non-Hispanic African American: Yes     Diabetic: Yes     Tobacco smoker: Yes     Systolic Blood Pressure: [id number]     Is BP treated: Yes     HDL Cholesterol: 69 mg/dL     Total Cholesterol: 174 mg/dL Reviewed, see A&P  Assessment & Plan:    Problem List Items Addressed This Visit      Cardiovascular and Mediastinum   Hypertension      Continue Losartan 50 mg  Take 12.5mg  chlorthalidone (we may change this medication in future visits- reports that it has made her sick previously- diarrhea)  Patient to take her BP in th AMs until the next visit.       Relevant Medications   atorvastatin (LIPITOR) 20 MG tablet     Endocrine   Diabetes mellitus without complication (HCC) - Primary     A1C was 7.3 today.   No changes to your metformin   Also starting Atorvastatin 20 mg daily for your high risk of cardiovascular disease. Will increase to 40 mg   Ophthalmology referral       Relevant Medications   metFORMIN (GLUCOPHAGE) 1000 MG tablet   atorvastatin (LIPITOR) 20 MG tablet   Other Relevant Orders   HgB A1c (Completed)   Ambulatory referral to Ophthalmology     Nervous and Auditory   Carpal tunnel syndrome    Right worse than Left   Hx to occupation related  over use   Wear wrist guards daily and most importantly, through the night.   Patient not interested in surgical intervention at this time.        Relevant Medications   buPROPion (WELLBUTRIN XL) 150 MG 24 hr tablet     Genitourinary   CKD (chronic kidney disease) stage 2, GFR 60-89 ml/min     Declining GFR in previous labs. Recheck BMP today.        Relevant Orders   Basic Metabolic Panel (Completed)     Other   Class 3 severe obesity with body mass index (BMI) of 40.0 to 44.9 in adult [facility])     MI 42.48   You are  doing a great job with starting to exercise at least 5 times a week and changing your diet!   Keep this up!        Relevant Medications   metFORMIN (GLUCOPHAGE) 1000 MG tablet   Healthcare maintenance     Call to schedule your mammogram   Call your insurance company to see what type of colon cancer screening tests they cover that can be done instead of colonoscopy.   Referral to Ophthalmology - let them know that you have diabetes and high blood pressure   Thank you for getting your flu shot!   Add on Hep C to orders today   PAP smear at next visit   Tdap and pneumococcal vaccines at next visit        Joint pain    Joint pain in shoulders, elbows, and knees. Likely 2/2 arthritic changes. No imaging available at this time. Continue to monitor   No more ibuprofen or NSAIDs -- mildly elevated creatinine   Tylenol every 650 mg every 4-6 hours as needed for pain. Do not take more than 4,000 mg in 24 hours      Shortness of breath on exertion     Prescribing albuterol inhaler. Previously prescribed, but no diagnosis of asthma or COPD. She reports SOB with just getting around her house.   Current smoker, starting wellbutrin today   We will need to discuss this further at an upcoming visit.   Referral to pulm for PFT  Likely start LABA/ICS combo depending on how often patient uses albuterol.         Tobacco dependence    Goal: cost of  smoking. Has tried nicotine patch and gum in the past, did not like.   Starting Wellbutrin today. 150mg  x 3 days, then increase to 300mg    Follow up appointment in 2 weeks        Other Visit Diagnoses    Screening for breast cancer       Relevant Orders   MM Digital Screening   Need for immunization against influenza       Relevant Orders   Flu Vaccine QUAD 36+ mos IM (Completed)   Screening for HIV (human immunodeficiency virus)       Relevant Orders   HIV antibody (with reflex) (Completed)   Need for hepatitis C screening test         Acid Reflux   No symptoms, has been on for 2 years   Stop taking omeprazole   Planning:  We will focus on diabetes and blood pressure at the next visit as well as health maintenance as above  You will have to come back for another visit for inhalers and asthma.   Health Maintenance Due  Topic Date Due  . MAMMOGRAM  10/17/2007    Health Maintenance discussed with patient and patient agrees to address when able.   Follow up:  Future Appointments  Date Time Provider [place]  07/03/2019 12:40 PM [facility]  07/03/2019  1:55 PM Wilber Oliphant, MD [facility]    Wilber Oliphant, M.D.  PGY-2  Family Medicine  [phone] 06/16/2019 12:23 PM

## 2019-06-14 NOTE — Patient Instructions (Addendum)
Dear Alexandra Campos,   It was good to see you! Thank you for taking your time to come in to be seen. Today, we discussed the following:   We discussed A LOT today. Here is a list of your health problems and some plans for them:   Diabetes   A1C was 7.3 today.   No changes to your metformin   Also starting Atorvastatin 20 mg daily for your high risk of cardiovascular disease.  High Blood Pressure    Continue Losartan 50 mg  Take 12.5mg  chlorthalidone (we may change this medication in future visits)  Smoking  Starting Wellbutrin today.   Follow up appointment in 2 weeks   [place]! You can do this!   Carpal tunnel   Wear wrist guards daily and most importantly, through the night.   Arthritis   No more ibuprofen or NSAIDs. (aka, advil, motrin, aleve, etc).   Tylenol every 650 mg every 4-6 hours as needed for pain. Do not take more than 4,000 mg in 24 hours ( This would be 6 doses of 650 mg).   Acid Reflux   Stop taking your omeprazole   Shortness of Breath   Prescribing albuterol inhaler.   We will need to discuss this further at an upcoming visit.   Weight Loss   You are doing a great job with starting to exercise at least 5 times a week and changing your diet!   Keep this up!   Health Maintenance:    Call to schedule your mammogram   Call your insurance company to see what type of colon cancer screening tests they cover that can be done instead of colonoscopy.   Referral to Ophthalmology - let them know that you have diabetes and high blood pressure   Thank you for getting your flu shot!   PAP smear at next visit   Tdap and pneumococcal vaccines at next visit   Planning:  We will focus on diabetes and blood pressure at the next visit as well as health maintenance as above  You will have to come back for another visit for inhalers and asthma.   Be well,   Zettie Cooley, M.D   [facility] [phone]  *Sign up for MyChart for instant access to your health profile, labs, orders, upcoming appointments or to contact your provider with questions*  =================================================================================== Some important items for Alexandra Campos's health:    Your Blood Pressure.  Should be LESS THAN 140/90 or 150/90 if you are over 65 BP Readings from Last 3 Encounters:  06/14/19 130/80  05/14/19 (!) 187/87  02/07/19 132/82    Your Weight History Wt Readings from Last 3 Encounters:  06/14/19 217 lb 8 oz (98.7 kg)  11/08/18 214 lb 9.6 oz (97.3 kg)  10/25/18 221 lb 14.4 oz (100.7 kg)    Body mass index is 42.48 kg/m.  BMI Classes Classification BMI Category (kg/m2)  Underweight < 18.5  Normal Weight 18.5-24.9  Overweight  25.0-29.9  Obese Class I 30.0-34.9  Obese Class II 35.0-39.9  Obese Class III  > or  = 40.0      Your last A1C     Every 3-6 months if you have diabetes Lab Results  Component Value Date   HGBA1C 7.3 (A) 06/14/2019    Your last Cholesterol   Every 1-5 years    Component Value Date/Time   CHOL 174 03/23/2019 1920   HDL 69 03/23/2019 1920  [place] 72 03/23/2019 1920    Your last Blood Tests -  Once a year if you take medications    Component Value Date/Time   K 3.9 03/23/2019 1920   K 3.4 (L) 12/12/2012 1420   CREATININE 1.09 (H) 03/23/2019 1920   CREATININE 0.71 12/12/2012 1420   GLUCOSE 120 (H) 03/23/2019 1920   GLUCOSE 170 (H) 11/22/2017 1756   GLUCOSE 282 (H) 12/12/2012 1420    To Keep You Healthy Your are due for the following Health Maintenance Items:  Health Maintenance Due  Topic Date Due  . Hepatitis C Screening  26-Nov-1955  . PNEUMOCOCCAL POLYSACCHARIDE VACCINE AGE 43-64 HIGH RISK  10/17/1959  . HIV Screening  10/16/1972  . TETANUS/TDAP  10/16/1976  . PAP SMEAR-Modifier  10/16/1978  . MAMMOGRAM  10/17/2007  . COLONOSCOPY  10/17/2007  . INFLUENZA VACCINE  04/15/2019    Please schedule an  appointment with your healthcare provider for any questions or concerns regarding your health or any of the items above.     Exercising to Stay Healthy To become healthy and stay healthy, it is recommended that you do moderate-intensity and vigorous-intensity exercise. You can tell that you are exercising at a moderate intensity if your heart starts beating faster and you start breathing faster but can still hold a conversation. You can tell that you are exercising at a vigorous intensity if you are breathing much harder and faster and cannot hold a conversation while exercising. Exercising regularly is important. It has many health benefits, such as:  Improving overall fitness, flexibility, and endurance.  Increasing bone density.  Helping with weight control.  Decreasing body fat.  Increasing muscle strength.  Reducing stress and tension.  Improving overall health. How often should I exercise? Choose an activity that you enjoy, and set realistic goals. Your health care provider can help you make an activity plan that works for you. Exercise regularly as told by your health care provider. This may include:  Doing strength training two times a week, such as: ? Lifting weights. ? Using resistance bands. ? Push-ups. ? Sit-ups. ? Yoga.  Doing a certain intensity of exercise for a given amount of time. Choose from these options: ? A total of 150 minutes of moderate-intensity exercise every week. ? A total of 75 minutes of vigorous-intensity exercise every week. ? A mix of moderate-intensity and vigorous-intensity exercise every week. Children, pregnant women, people who have not exercised regularly, people who are overweight, and older adults may need to talk with a health care provider about what activities are safe to do. If you have a medical condition, be sure to talk with your health care provider before you start a new exercise program. What are some exercise  ideas? Moderate-intensity exercise ideas include:  Walking 1 mile (1.6 km) in about 15 minutes.  Biking.  Hiking.  Golfing.  Dancing.  Water aerobics. Vigorous-intensity exercise ideas include:  Walking 4.5 miles (7.2 km) or more in about 1 hour.  Jogging or running 5 miles (8 km) in about 1 hour.  Biking 10 miles (16.1 km) or more in about 1 hour.  Lap swimming.  Roller-skating or in-line skating.  Cross-country skiing.  Vigorous competitive sports, such as football, basketball, and soccer.  Jumping rope.  Aerobic dancing. What are some everyday activities that can help me to get exercise?  [place] work, such as: ? Pushing a Conservation officer, nature. ? Raking and bagging leaves.  Washing your car.  Pushing a stroller.  Shoveling snow.  Gardening.  Washing windows or floors. How can I be more active in my day-to-day activities?  Use stairs instead of an elevator.  Take a walk during your lunch break.  If you drive, park your car farther away from your work or school.  If you take public transportation, get off one stop early and walk the rest of the way.  Stand up or walk around during all of your indoor phone calls.  Get up, stretch, and walk around every 30 minutes throughout the day.  Enjoy exercise with a friend. Support to continue exercising will help you keep a regular routine of activity. What guidelines can I follow while exercising?  Before you start a new exercise program, talk with your health care provider.  Do not exercise so much that you hurt yourself, feel dizzy, or get very short of breath.  Wear comfortable clothes and wear shoes with good support.  Drink plenty of water while you exercise to prevent dehydration or heat stroke.  Work out until your breathing and your heartbeat get faster. Where to find more information  U.S. Department of Health and Human Services: [employer]at  Centers for Disease Control and Prevention (CDC):  http://www.wolf.info/ Summary  Exercising regularly is important. It will improve your overall fitness, flexibility, and endurance.  Regular exercise also will improve your overall health. It can help you control your weight, reduce stress, and improve your bone density.  Do not exercise so much that you hurt yourself, feel dizzy, or get very short of breath.  Before you start a new exercise program, talk with your health care provider. This information is not intended to replace advice given to you by your health care provider. Make sure you discuss any questions you have with your health care provider. Document Released: 10/03/2010 Document Revised: 08/13/2017 Document Reviewed: 07/22/2017 Elsevier Patient Education  2020 [employer].

## 2019-06-15 LAB — BASIC METABOLIC PANEL
BUN/Creatinine Ratio: 23 (ref 12–28)
BUN: 27 mg/dL (ref 8–27)
CO2: 23 mmol/L (ref 20–29)
Calcium: 9.9 mg/dL (ref 8.7–10.3)
Chloride: 103 mmol/L (ref 96–106)
Creatinine, Ser: 1.15 mg/dL — ABNORMAL HIGH (ref 0.57–1.00)
GFR calc Af Amer: 59 mL/min/{1.73_m2} — ABNORMAL LOW (ref >59)
GFR calc non Af Amer: 51 mL/min/{1.73_m2} — ABNORMAL LOW (ref >59)
Glucose: 103 mg/dL — ABNORMAL HIGH (ref 65–99)
Potassium: 4.3 mmol/L (ref 3.5–5.2)
Sodium: 140 mmol/L (ref 134–144)

## 2019-06-15 LAB — HIV ANTIBODY (ROUTINE TESTING W REFLEX): HIV Screen 4th Generation wRfx: NONREACTIVE

## 2019-06-16 ENCOUNTER — Encounter: Payer: Self-pay | Admitting: Family Medicine

## 2019-06-16 DIAGNOSIS — G5600 Carpal tunnel syndrome, unspecified upper limb: Secondary | ICD-10-CM | POA: Insufficient documentation

## 2019-06-16 DIAGNOSIS — R0602 Shortness of breath: Secondary | ICD-10-CM | POA: Insufficient documentation

## 2019-06-16 DIAGNOSIS — G5601 Carpal tunnel syndrome, right upper limb: Secondary | ICD-10-CM | POA: Insufficient documentation

## 2019-06-16 DIAGNOSIS — N183 Chronic kidney disease, stage 3 unspecified: Secondary | ICD-10-CM | POA: Insufficient documentation

## 2019-06-16 DIAGNOSIS — Z6841 Body Mass Index (BMI) 40.0 and over, adult: Secondary | ICD-10-CM | POA: Insufficient documentation

## 2019-06-16 DIAGNOSIS — M2550 Pain in unspecified joint: Secondary | ICD-10-CM | POA: Insufficient documentation

## 2019-06-16 DIAGNOSIS — N182 Chronic kidney disease, stage 2 (mild): Secondary | ICD-10-CM | POA: Insufficient documentation

## 2019-06-16 DIAGNOSIS — Z0000 Encounter for general adult medical examination without abnormal findings: Secondary | ICD-10-CM | POA: Insufficient documentation

## 2019-06-16 NOTE — Assessment & Plan Note (Signed)
   Prescribing albuterol inhaler. Previously prescribed, but no diagnosis of asthma or COPD. She reports SOB with just getting around her house.   Current smoker, starting wellbutrin today   We will need to discuss this further at an upcoming visit.   Referral to pulm for PFT  Likely start LABA/ICS combo depending on how often patient uses albuterol.

## 2019-06-16 NOTE — Assessment & Plan Note (Signed)
Joint pain in shoulders, elbows, and knees. Likely 2/2 arthritic changes. No imaging available at this time. Continue to monitor   No more ibuprofen or NSAIDs -- mildly elevated creatinine   Tylenol every 650 mg every 4-6 hours as needed for pain. Do not take more than 4,000 mg in 24 hours

## 2019-06-16 NOTE — Assessment & Plan Note (Signed)
   Declining GFR in previous labs. Recheck BMP today.

## 2019-06-16 NOTE — Assessment & Plan Note (Signed)
>>  ASSESSMENT AND PLAN FOR TOBACCO DEPENDENCE WRITTEN ON 06/16/2019 12:13 PM BY KIM, RACHEL E, MD  Goal: cost of smoking. Has tried nicotine  patch and gum in the past, did not like.  Starting Wellbutrin  today. 150mg  x 3 days, then increase to 300mg   Follow up appointment in 2 weeks

## 2019-06-16 NOTE — Assessment & Plan Note (Signed)
   A1C was 7.3 today.   No changes to your metformin   Also starting Atorvastatin 20 mg daily for your high risk of cardiovascular disease. Will increase to 40 mg   Ophthalmology referral

## 2019-06-16 NOTE — Assessment & Plan Note (Signed)
Goal: cost of smoking. Has tried nicotine patch and gum in the past, did not like.   Starting Wellbutrin today. 150mg  x 3 days, then increase to 300mg    Follow up appointment in 2 weeks

## 2019-06-16 NOTE — Assessment & Plan Note (Signed)
   Call to schedule your mammogram   Call your insurance company to see what type of colon cancer screening tests they cover that can be done instead of colonoscopy.   Referral to Ophthalmology - let them know that you have diabetes and high blood pressure   Thank you for getting your flu shot!   Add on Hep C to orders today   PAP smear at next visit   Tdap and pneumococcal vaccines at next visit

## 2019-06-16 NOTE — Assessment & Plan Note (Signed)
   MI 42.48   You are doing a great job with starting to exercise at least 5 times a week and changing your diet!   Keep this up!

## 2019-06-16 NOTE — Assessment & Plan Note (Signed)
   Continue Losartan 50 mg  Take 12.5mg  chlorthalidone (we may change this medication in future visits- reports that it has made her sick previously- diarrhea)  Patient to take her BP in th AMs until the next visit.

## 2019-06-16 NOTE — Assessment & Plan Note (Signed)
Right worse than Left   Hx to occupation related over use   Wear wrist guards daily and most importantly, through the night.   Patient not interested in surgical intervention at this time.

## 2019-07-03 ENCOUNTER — Other Ambulatory Visit: Payer: Self-pay

## 2019-07-03 ENCOUNTER — Ambulatory Visit (INDEPENDENT_AMBULATORY_CARE_PROVIDER_SITE_OTHER): Payer: Medicaid Other | Admitting: Family Medicine

## 2019-07-03 ENCOUNTER — Ambulatory Visit
Admission: RE | Admit: 2019-07-03 | Discharge: 2019-07-03 | Disposition: A | Discharge status: Home / Self Care | Payer: Medicaid Other | Source: Ambulatory Visit | Attending: Family Medicine | Admitting: Family Medicine

## 2019-07-03 ENCOUNTER — Encounter: Payer: Self-pay | Admitting: Family Medicine

## 2019-07-03 VITALS — BP 122/80 | HR 84 | Wt 217.4 lb

## 2019-07-03 DIAGNOSIS — N183 Chronic kidney disease, stage 3 unspecified: Secondary | ICD-10-CM

## 2019-07-03 DIAGNOSIS — F17200 Nicotine dependence, unspecified, uncomplicated: Secondary | ICD-10-CM | POA: Diagnosis not present

## 2019-07-03 DIAGNOSIS — Z23 Encounter for immunization: Secondary | ICD-10-CM | POA: Diagnosis not present

## 2019-07-03 DIAGNOSIS — Z0000 Encounter for general adult medical examination without abnormal findings: Secondary | ICD-10-CM

## 2019-07-03 DIAGNOSIS — I10 Essential (primary) hypertension: Secondary | ICD-10-CM

## 2019-07-03 DIAGNOSIS — E1122 Type 2 diabetes mellitus with diabetic chronic kidney disease: Secondary | ICD-10-CM | POA: Diagnosis present

## 2019-07-03 DIAGNOSIS — I129 Hypertensive chronic kidney disease with stage 1 through stage 4 chronic kidney disease, or unspecified chronic kidney disease: Secondary | ICD-10-CM

## 2019-07-03 DIAGNOSIS — Z1239 Encounter for other screening for malignant neoplasm of breast: Secondary | ICD-10-CM

## 2019-07-03 MED ORDER — CHLORTHALIDONE 25 MG PO TABS: 25.0000 mg | ORAL_TABLET | Freq: Every day | ORAL | Status: DC

## 2019-07-03 MED ORDER — ATORVASTATIN CALCIUM 40 MG PO TABS: 40.0000 mg | ORAL_TABLET | Freq: Every day | ORAL | 2 refills | Status: DC

## 2019-07-03 NOTE — Assessment & Plan Note (Signed)
Patient currently smoking about 6 to 7 cigarettes a day.  She reports that her cravings have decreased throughout the day using the Wellbutrin.  She does report that she starts to have cravings later in the evening. Will asked Dr. Valentina Lucks about increasing Wellbutrin XL to 450 mg or other strategies that he may use in these cases.

## 2019-07-03 NOTE — Assessment & Plan Note (Signed)
No changes in diabetic regimen today.  Follow-up A1c in 2-1/2 months.

## 2019-07-03 NOTE — Assessment & Plan Note (Signed)
>>  ASSESSMENT AND PLAN FOR TOBACCO DEPENDENCE WRITTEN ON 07/03/2019  2:49 PM BY KIM, RACHEL E, MD  Patient currently smoking about 6 to 7 cigarettes a day.  She reports that her cravings have decreased throughout the day using the Wellbutrin .  She does report that she starts to have cravings later in the evening. Will asked Dr. Koval about increasing Wellbutrin  XL to 450 mg or other strategies that he may use in these cases.

## 2019-07-03 NOTE — Assessment & Plan Note (Signed)
We will perform Pap at next visit

## 2019-07-03 NOTE — Assessment & Plan Note (Addendum)
We will add on amlodipine 2.5 mg today and asked patient to reassess in 2 weeks at home.  If her blood pressures are under 140/80, she can continue with the 2.5 mg.  If she continues to have blood pressures above this number, we will increase her to 5 mg.  Patient should follow-up within 1 to 2 months for this change.  Spoke to patient about side effects and things to look out for.  Also added the handout to her AVS.  Patient previously on 50 mg of chlorthalidone but was likely not adding any benefit to patient.  I decreased it to 25 mg at her last visit.  Patient is currently taking half of her chlorthalidone pills at this time as she has plenty of the 50 mg tablets at home.  We will refill as 25 mg when needed.

## 2019-07-03 NOTE — Patient Instructions (Addendum)
Dear Alexandra Campos,   It was good to see you! Thank you for taking your time to come in to be seen. Today, we discussed the following:   High Blood Pressure   Adding Amlodipine 2.5 mg today   Take this for two weeks  On Tuesday November 3rd,  November 2nd,  and if your blood pressures are still over 140 (top number) and 90 (bottom number), please start taking 5 mg.   Diabetes & High cholesterol  Increase atorvastatin to 40 mg  Keep checking your feet at least once a week  Chronic Kidney Disease Stage 3A   Keep up with the high blood pressure and diabetes  Asthma, shortness of breath  As discussed previously, we are covering a lot of health conditions during short appointments.  Please make a follow-up appointment to assess shortness of breath at your earliest convenience  Smoking   Continue to take the Wellbutrin. I will ask our pharmacist for his recommendations for further cessation in your case. We will discuss this at your next visit.   If you have difficulty obtaining your medications due to finances, please call the office for possible resources and problem solving.  Please follow up in 1 month for high blood pressure or sooner for concerning or worsening symptoms. In 2.5 months, we will recheck your A1C, lipid panel  Be well,    Alexandra Campos, M.D   [name] [phone]  *Sign up for MyChart for instant access to your health profile, labs, orders, upcoming appointments or to contact your provider with questions*  =================================================================================== Amlodipine tablets What is this medicine? AMLODIPINE (am LOE di peen) is a calcium-channel blocker. It affects the amount of calcium found in your heart and muscle cells. This relaxes your blood vessels, which can reduce the amount of work the heart has to do. This medicine is used to lower high blood pressure. It is also used to prevent chest pain. This  medicine may be used for other purposes; ask your health care provider or pharmacist if you have questions. COMMON BRAND NAME(S): Norvasc What should I tell my health care provider before I take this medicine? They need to know if you have any of these conditions:  heart disease  liver disease  an unusual or allergic reaction to amlodipine, other medicines, foods, dyes, or preservatives  pregnant or trying to get pregnant  breast-feeding How should I use this medicine? Take this medicine by mouth with a glass of water. Follow the directions on the prescription label. You can take it with or without food. If it upsets your stomach, take it with food. Take your medicine at regular intervals. Do not take it more often than directed. Do not stop taking except on your doctor's advice. Talk to your pediatrician regarding the use of this medicine in children. While this drug may be prescribed for children as young as 6 years for selected conditions, precautions do apply. Patients over 66 years of age may have a stronger reaction and need a smaller dose. Overdosage: If you think you have taken too much of this medicine contact a poison control center or emergency room at once. NOTE: This medicine is only for you. Do not share this medicine with others. What if I miss a dose? If you miss a dose, take it as soon as you can. If it is almost time for your next dose, take only that dose. Do not take double or extra doses. What may interact with  this medicine? Do not take this medicine with any of the following medications:  tranylcypromine This medicine may also interact with the following medications:  clarithromycin  cyclosporine  diltiazem  itraconazole  simvastatin  tacrolimus This list may not describe all possible interactions. Give your health care provider a list of all the medicines, herbs, non-prescription drugs, or dietary supplements you use. Also tell them if you smoke, drink  alcohol, or use illegal drugs. Some items may interact with your medicine. What should I watch for while using this medicine? Visit your healthcare professional for regular checks on your progress. Check your blood pressure as directed. Ask your healthcare professional what your blood pressure should be and when you should contact him or her. Do not treat yourself for coughs, colds, or pain while you are using this medicine without asking your healthcare professional for advice. Some medicines may increase your blood pressure. You may get dizzy. Do not drive, use machinery, or do anything that needs mental alertness until you know how this medicine affects you. Do not stand or sit up quickly, especially if you are an older patient. This reduces the risk of dizzy or fainting spells. Avoid alcoholic drinks; they can make you dizzier. What side effects may I notice from receiving this medicine? Side effects that you should report to your doctor or health care professional as soon as possible:  allergic reactions like skin rash, itching or hives; swelling of the face, lips, or tongue  fast, irregular heartbeat  signs and symptoms of low blood pressure like dizziness; feeling faint or lightheaded, falls; unusually weak or tired  swelling of ankles, feet, hands Side effects that usually do not require medical attention (report these to your doctor or health care professional if they continue or are bothersome):  dry mouth  facial flushing  headache  stomach pain  tiredness This list may not describe all possible side effects. Call your doctor for medical advice about side effects. You may report side effects to FDA at 1-800-FDA-1088. Where should I keep my medicine? Keep out of the reach of children. Store at room temperature between 59 and 86 degrees F (15 and 30 degrees C). Throw away any unused medicine after the expiration date. NOTE: This sheet is a summary. It may not cover all  possible information. If you have questions about this medicine, talk to your doctor, pharmacist, or health care provider.  2020 Elsevier/Gold Standard (2018-03-25 15:07:10)  Chronic Kidney Disease, Adult Chronic kidney disease (CKD) happens when the kidneys are damaged over a long period of time. The kidneys are two organs that help with:  Getting rid of waste and extra fluid from the blood.  Making hormones that maintain the amount of fluid in your tissues and blood vessels.  Making sure that the body has the right amount of fluids and chemicals. Most of the time, CKD does not go away, but it can usually be controlled. Steps must be taken to slow down the kidney damage or to stop it from getting worse. If this is not done, the kidneys may stop working. Follow these instructions at home: Medicines  Take over-the-counter and prescription medicines only as told by your doctor. You may need to change the amount of medicines you take.  Do not take any new medicines unless your doctor says it is okay. Many medicines can make your kidney damage worse.  Do not take any vitamin and supplements unless your doctor says it is okay. Many vitamins and supplements  can make your kidney damage worse. General instructions  Follow a diet as told by your doctor. You may need to stay away from: ? Alcohol. ? Salty foods. ? Foods that are high in:  Potassium.  Calcium.  Protein.  Do not use any products that contain nicotine or tobacco, such as cigarettes and e-cigarettes. If you need help quitting, ask your doctor.  Keep track of your blood pressure at home. Tell your doctor about any changes.  If you have diabetes, keep track of your blood sugar as told by your doctor.  Try to stay at a healthy weight. If you need help, ask your doctor.  Exercise at least 30 minutes a day, 5 days a week.  Stay up-to-date with your shots (immunizations) as told by your doctor.  Keep all follow-up visits as  told by your doctor. This is important. Contact a doctor if:  Your symptoms get worse.  You have new symptoms. Get help right away if:  You have symptoms of end-stage kidney disease. These may include: ? Headaches. ? Numbness in your hands or feet. ? Easy bruising. ? Having hiccups often. ? Chest pain. ? Shortness of breath. ? Stopping of menstrual periods in women.  You have a fever.  You have very little pee (urine).  You have pain or bleeding when you pee. Summary  Chronic kidney disease (CKD) happens when the kidneys are damaged over a long period of time.  Most of the time, this condition does not go away, but it can usually be controlled. Steps must be taken to slow down the kidney damage or to stop it from getting worse.  Treatment may include a combination of medicines and lifestyle changes. This information is not intended to replace advice given to you by your health care provider. Make sure you discuss any questions you have with your health care provider. Document Released: 11/25/2009 Document Revised: 08/13/2017 Document Reviewed: 10/05/2016 Elsevier Patient Education  2020 [employer].

## 2019-07-03 NOTE — Progress Notes (Signed)
  Subjective  CC: Annual Exam  [id number] Alexandra Campos is a 63 y.o. female who presents today with the following problems:  Tobacco cessation Improving, but feels like the medicine wears off around 7 and has cravings then. Currently, smoking around 6-7 cigarettes a day.  Blood pressure Brought blood pressure values since the beginning of October.  Most values are over [id number] systolic.  Patient reports that she has been taking her blood pressure medication in the mornings after eating.  She is currently taking 50 mg of losartan, 25 mg of chlorthalidone.  She denies dyspnea on exertion, chest pain, lower extremity edema.   Hyperlipidemia Tolerating atorvastatin well.  No complaints at this time.  Pertinent P/F/SHx: CKD, HTN, tobacco dependence, chronic joint pain ROS: Pertinent ROS included in HPI. Objective  Physical Exam:  BP 122/80   Pulse 84   Wt 217 lb 6.4 oz (98.6 kg)   SpO2 98%   BMI 42.46 kg/m  Gen: Appears well. NAD.  Lungs: Productive bronchitic cough noted. No active SOB today. Breathing comfortably   Assessment & Plan    Problem List Items Addressed This Visit      Cardiovascular and Mediastinum   Hypertension    We will add on amlodipine 2.5 mg today and asked patient to reassess in 2 weeks at home.  If her blood pressures are under 140/80, she can continue with the 2.5 mg.  If she continues to have blood pressures above this number, we will increase her to 5 mg.  Patient should follow-up within 1 to 2 months for this change.  Spoke to patient about side effects and things to look out for.  Also added the handout to her AVS.  Patient previously on 50 mg of chlorthalidone but was likely not adding any benefit to patient.  I decreased it to 25 mg at her last visit.  Patient is currently taking half of her chlorthalidone pills at this time as she has plenty of the 50 mg tablets at home.  We will refill as 25 mg when needed.      Relevant Medications   atorvastatin (LIPITOR)  40 MG tablet   chlorthalidone (HYGROTON) 25 MG tablet   Type 2 DM with CKD stage 3 and hypertension ([place]) - Primary    No changes in diabetic regimen today.  Follow-up A1c in 2-1/2 months.      Relevant Medications   atorvastatin (LIPITOR) 40 MG tablet   chlorthalidone (HYGROTON) 25 MG tablet     Other   Healthcare maintenance    We will perform Pap at next visit      Tobacco dependence    Patient currently smoking about 6 to 7 cigarettes a day.  She reports that her cravings have decreased throughout the day using the Wellbutrin.  She does report that she starts to have cravings later in the evening. Will asked Dr. Valentina Lucks about increasing Wellbutrin XL to 450 mg or other strategies that he may use in these cases.       Other Visit Diagnoses    Pneumococcal vaccine administered       Relevant Orders   Pneumococcal conjugate vaccine 13-valent IM (Completed)   Vaccine for diphtheria-tetanus-pertussis, combined       Relevant Orders   Tdap vaccine greater than or equal to 7yo IM     Wilber Oliphant, M.D.  PGY-2  Family Medicine  [phone] 07/04/2019 10:41 AM

## 2019-07-04 NOTE — Progress Notes (Signed)
Donald Prose, Student-PharmD  Wilber Oliphant, MD        Hello Dr. Maudie Mercury,    The Pharmacy Team is working on an initiative for patients with diabetes and chronic kidney disease. We have identified this patient as someone who would benefit from a SGLT-2 inhibitor, and has an upcoming appointment with you on Monday October 19 at 1:55 PM.   Current diabetes regimen: Metformin 1000 mg BID.  Other patient-specific factors which may benefit from SGLT-2 therapy: CKD, hypertension, obesity.   Recommendations:  Initiate empagliflozin (Jardiance) 10 mg once daily or dapagliflozin (Farxiga) 5 mg once daily.   Pharmacy would be happy to help implement therapy changes and follow up with the patient to monitor safety and efficacy after therapy initiation. Pharmacy would also be happy to help if the patient needs assistance with affording the medication.   Thanks,  Donald Prose, PharmD candidate    Will start SGLT 2 therapy at next visit.

## 2019-07-05 ENCOUNTER — Other Ambulatory Visit: Payer: Self-pay | Admitting: Family Medicine

## 2019-07-05 MED ORDER — AMLODIPINE BESYLATE 2.5 MG PO TABS: 2.5000 mg | ORAL_TABLET | Freq: Every day | ORAL | 0 refills | Status: DC

## 2019-07-18 ENCOUNTER — Other Ambulatory Visit: Payer: Self-pay

## 2019-07-18 DIAGNOSIS — I10 Essential (primary) hypertension: Secondary | ICD-10-CM

## 2019-07-18 DIAGNOSIS — Z789 Other specified health status: Secondary | ICD-10-CM

## 2019-07-19 MED ORDER — CHLORTHALIDONE 25 MG PO TABS: 25.0000 mg | ORAL_TABLET | Freq: Every day | ORAL | 2 refills | Status: DC

## 2019-07-19 MED ORDER — CETIRIZINE HCL 10 MG PO TABS: 10.0000 mg | ORAL_TABLET | Freq: Every day | ORAL | 0 refills | Status: DC

## 2019-07-19 MED ORDER — BUPROPION HCL ER (XL) 300 MG PO TB24: 300.0000 mg | ORAL_TABLET | Freq: Every day | ORAL | 2 refills | Status: DC

## 2019-07-19 NOTE — Addendum Note (Signed)
Addended by: Christen Bame D on: 07/19/2019 11:52 AM   Modules accepted: Orders

## 2019-07-19 NOTE — Telephone Encounter (Signed)
Chlorthiadone was set to print.  Resent electronically. Christen Bame, CMA

## 2019-08-04 ENCOUNTER — Telehealth: Payer: Self-pay

## 2019-08-04 NOTE — Telephone Encounter (Signed)
Patient calls nurse line to report recent BP readings. Patient stated she recently started taking all of her BP meds at night and feels this is not working for her. Per patient, she has been having higher readings.  11/6 121/99  11/9 136/90 11/10 137/87 11/11 150/88 11/12 137/81 11/13 145/85  11/16 139/94 11/18 139/84 11/19 154/90  Patient stated she took her BP meds this am. I had her check her BP while she was on the phone with me (had been ~2 hours since she took meds.) 162/89. Patient denies any immediate sxs. I scheduled her a FU with you the first week in December.

## 2019-08-04 NOTE — Telephone Encounter (Signed)
Spoke to patient over the phone. Increasing amlodipine to 5mg  from 2.5 mg. Following up with patient in the first week of December. Thank you, Page!   Wilber Oliphant, M.D.  4:53 PM 08/04/2019

## 2019-08-17 ENCOUNTER — Encounter: Payer: Self-pay | Admitting: Family Medicine

## 2019-08-17 ENCOUNTER — Other Ambulatory Visit: Payer: Self-pay

## 2019-08-17 ENCOUNTER — Ambulatory Visit (INDEPENDENT_AMBULATORY_CARE_PROVIDER_SITE_OTHER): Payer: Medicaid Other | Admitting: Family Medicine

## 2019-08-17 VITALS — BP 158/78 | HR 86

## 2019-08-17 DIAGNOSIS — Z0000 Encounter for general adult medical examination without abnormal findings: Secondary | ICD-10-CM | POA: Diagnosis not present

## 2019-08-17 DIAGNOSIS — N183 Chronic kidney disease, stage 3 unspecified: Secondary | ICD-10-CM

## 2019-08-17 DIAGNOSIS — Z6841 Body Mass Index (BMI) 40.0 and over, adult: Secondary | ICD-10-CM | POA: Diagnosis not present

## 2019-08-17 DIAGNOSIS — E1122 Type 2 diabetes mellitus with diabetic chronic kidney disease: Secondary | ICD-10-CM | POA: Diagnosis not present

## 2019-08-17 DIAGNOSIS — I10 Essential (primary) hypertension: Secondary | ICD-10-CM | POA: Diagnosis not present

## 2019-08-17 DIAGNOSIS — R0602 Shortness of breath: Secondary | ICD-10-CM

## 2019-08-17 DIAGNOSIS — F17200 Nicotine dependence, unspecified, uncomplicated: Secondary | ICD-10-CM | POA: Diagnosis not present

## 2019-08-17 DIAGNOSIS — I129 Hypertensive chronic kidney disease with stage 1 through stage 4 chronic kidney disease, or unspecified chronic kidney disease: Secondary | ICD-10-CM | POA: Diagnosis not present

## 2019-08-17 DIAGNOSIS — E119 Type 2 diabetes mellitus without complications: Secondary | ICD-10-CM

## 2019-08-17 LAB — POCT GLYCOSYLATED HEMOGLOBIN (HGB A1C): HbA1c, POC (controlled diabetic range): 7.2 % — AB (ref 0.0–7.0)

## 2019-08-17 MED ORDER — AMLODIPINE BESYLATE 10 MG PO TABS: 10.0000 mg | ORAL_TABLET | Freq: Every day | ORAL | 0 refills | Status: DC

## 2019-08-17 MED ORDER — EMPAGLIFLOZIN 10 MG PO TABS: 10.0000 mg | ORAL_TABLET | Freq: Every day | ORAL | 3 refills | Status: DC

## 2019-08-17 MED ORDER — BLOOD GLUCOSE MONITOR KIT: 1.0000 | PACK | Freq: Every day | 0 refills | Status: DC

## 2019-08-17 NOTE — Patient Instructions (Signed)
Dear [name],   It was good to see you! Thank you for taking your time to come in to be seen. Today, we discussed the following:   Hypertension and Diabetes   Increase amlodipine to 10 mg daily   Adding Emapgliflozin, a diabetes medication that has good cardiac and renal benefits. We will start at 10 mg daily.   Please continue to write down your blood pressures and blood sugars daily and bring them to the next appointment in 4-6 weeks.    Be well,   Zettie Cooley, M.D   [facility] [phone]  *Sign up for MyChart for instant access to your health profile, labs, orders, upcoming appointments or to contact your provider with questions*  ===================================================================================

## 2019-08-17 NOTE — Progress Notes (Signed)
Subjective  CC: Hypertension and Diabetes  [id number] Alexandra Campos is a 63 y.o. female who presents today with the following problems:  Hypertension: Patient here for follow-up of elevated blood pressure. She is exercising (walking around the house, going to the mailbox once a week)and is not adherent to low salt diet.  Blood pressure is not well controlled at home. Cardiac symptoms none. Patient denies chest pain, dyspnea, exertional chest pressure/discomfort, irregular heart beat, lower extremity edema, palpitations and syncope.  Cardiovascular risk factors: advanced age (older than 49 for men, 61 for women), diabetes mellitus, dyslipidemia, family history of premature cardiovascular disease, obesity (BMI >= 30 kg/m2), sedentary lifestyle and smoking/ tobacco exposure. Use of agents associated with hypertension: none. History of target organ damage: chronic kidney disease.  She is currently taking amlodipine 5 mg daily, losartan 50 mg and chlorthalidone 25 mg.  Her blood pressures continue to be consistently above 0000000 to [id number] systolic and 123XX123 to 0000000 diastolic.  Tobacco Cessation Previously had patches, but wasn't working. She doesn't want to stop smoking right now.   Diabetes:  Patient brought list of morning blood pressures and a.m. fasting sugars.  She reports compliance with all of her medications.  Her blood sugars range from 97-152 in the mornings, most values are over 110.  Her last A1c was 7.3.  Pertinent PM/FHx: Diabetes with chronic kidney disease, tobacco dependence, hypertension Social History   Tobacco Use  . Smoking status: Current Every Day Smoker    Packs/day: 1.00    Types: Cigarettes  . Smokeless tobacco: Never Used  . Tobacco comment: knows she needs to quit  Substance Use Topics  . Alcohol use: Yes    Alcohol/week: 5.0 standard drinks    Types: 5 Cans of beer per week    Comment: 5 drinks/week    ROS: Pertinent ROS included in HPI. Objective  Physical Exam:  BP (!)  158/78   Pulse 86   SpO2 99%  General: Well-appearing African-American female in no acute distress. Ears: impacted cerumen and unable to see TM prior to lavage bilaterally. TMs pearly gray after lavage.  Chest: Regular rate and rhythm.  No bilateral lower extremity edema.  2+ radial pulses. Lungs: Clear to auscultation bilaterally.  No increased work of breathing.  Patient has chronic productive cough. Extremities: Patient wears wrist splint on right hand.  Assessment & Plan    Problem List Items Addressed This Visit    Class 3 severe obesity with body mass index (BMI) of 40.0 to 44.9 in adult [facility])    Patient counseled on increasing exercise activity (at least 150 minutes a week of moderate intensity activity such as brisk walking and at least 2 days a week of activities that strengthen muscles) and on nutrition.  Hand out provided.  - lifestyle changes - follow up as needed         Relevant Medications   empagliflozin (JARDIANCE) 10 MG TABS tablet   Healthcare maintenance    Pap at next visit.  Patient requested to have ears cleaned rather than pap today      Hypertension    Continues to be above blood pressure goals.  Will increase amlodipine to 10 mg from 5 mg.  Patient does report that she forgets to take her medications since switching them over to nightly.  Encourage patient to take medication whenever it is easiest for her.  She does have pill packs at home.  Follow-up in 1 month      Relevant  Medications   amLODipine (NORVASC) 10 MG tablet   Shortness of breath on exertion    Did not get a chance to discuss any shortness of breath.  Patient did not bring this up during the examination.  At next visit, ask about how often she is using her inhaler.  She was trying the Wellbutrin and reports that it makes her want to smoke more.  Will discontinue Wellbutrin today.  Patient declines any further smoking cessation at this time.      Tobacco dependence    Reports that she does  not have the motivation to quit currently.  She wants to continue smoking.  Discontinue Wellbutrin.      Type 2 DM with CKD stage 3 and hypertension ([facility])    No changes in Metformin today.  Patient is ideal candidate for empagliflozin and she is agreeable to start today.  Will start at 10 mg daily.      Relevant Medications   amLODipine (NORVASC) 10 MG tablet   empagliflozin (JARDIANCE) 10 MG TABS tablet    Other Visit Diagnoses    Diabetes mellitus without complication ([place])    -  Primary   Relevant Medications   empagliflozin (JARDIANCE) 10 MG TABS tablet   Other Relevant Orders   HgB A1c (Completed)     Health Maintenance discussed with patient and patient agrees to address when able.  Health Maintenance Due  Topic Date Due  . PNEUMOCOCCAL POLYSACCHARIDE VACCINE AGE 68-64 HIGH RISK  10/17/1957  . PAP SMEAR-Modifier  10/16/1978  . OPHTHALMOLOGY EXAM  08/19/2019    Wilber Oliphant, M.D.  1:13 PM 08/20/2019

## 2019-08-20 ENCOUNTER — Encounter: Payer: Self-pay | Admitting: Family Medicine

## 2019-08-20 NOTE — Assessment & Plan Note (Signed)
Patient counseled on increasing exercise activity (at least 150 minutes a week of moderate intensity activity such as brisk walking and at least 2 days a week of activities that strengthen muscles) and on nutrition.  Hand out provided.  - lifestyle changes - follow up as needed

## 2019-08-20 NOTE — Assessment & Plan Note (Signed)
Reports that she does not have the motivation to quit currently.  She wants to continue smoking.  Discontinue Wellbutrin.

## 2019-08-20 NOTE — Assessment & Plan Note (Signed)
Continues to be above blood pressure goals.  Will increase amlodipine to 10 mg from 5 mg.  Patient does report that she forgets to take her medications since switching them over to nightly.  Encourage patient to take medication whenever it is easiest for her.  She does have pill packs at home.  Follow-up in 1 month

## 2019-08-20 NOTE — Assessment & Plan Note (Signed)
No changes in Metformin today.  Patient is ideal candidate for empagliflozin and she is agreeable to start today.  Will start at 10 mg daily.

## 2019-08-20 NOTE — Assessment & Plan Note (Addendum)
Pap at next visit.  Patient requested to have ears cleaned rather than pap today.  Patient is a good candidate for lung cancer screening.  Will discuss at next visit. Patient recently got new glasses.  Will ask about ophthalmology exam at next visit so that it can be charted.

## 2019-08-20 NOTE — Assessment & Plan Note (Signed)
>>  ASSESSMENT AND PLAN FOR TOBACCO DEPENDENCE WRITTEN ON 08/20/2019  1:13 PM BY KIM, RACHEL E, MD  Reports that she does not have the motivation to quit currently.  She wants to continue smoking.  Discontinue Wellbutrin .

## 2019-08-20 NOTE — Assessment & Plan Note (Signed)
Did not get a chance to discuss any shortness of breath.  Patient did not bring this up during the examination.  At next visit, ask about how often she is using her inhaler.  She was trying the Wellbutrin and reports that it makes her want to smoke more.  Will discontinue Wellbutrin today.  Patient declines any further smoking cessation at this time.

## 2019-08-30 ENCOUNTER — Telehealth: Payer: Self-pay | Admitting: *Deleted

## 2019-08-30 NOTE — Telephone Encounter (Signed)
Opened in error. Christen Bame, CMA

## 2019-09-21 ENCOUNTER — Other Ambulatory Visit: Payer: Self-pay | Admitting: Family Medicine

## 2019-09-21 DIAGNOSIS — Z789 Other specified health status: Secondary | ICD-10-CM

## 2019-10-02 ENCOUNTER — Ambulatory Visit: Payer: Medicaid Other | Admitting: Family Medicine

## 2019-10-03 ENCOUNTER — Other Ambulatory Visit: Payer: Self-pay

## 2019-10-03 ENCOUNTER — Ambulatory Visit (INDEPENDENT_AMBULATORY_CARE_PROVIDER_SITE_OTHER): Payer: Medicaid Other | Admitting: Family Medicine

## 2019-10-03 ENCOUNTER — Encounter: Payer: Self-pay | Admitting: Family Medicine

## 2019-10-03 VITALS — BP 108/62 | HR 91 | Wt 211.8 lb

## 2019-10-03 DIAGNOSIS — R300 Dysuria: Secondary | ICD-10-CM | POA: Insufficient documentation

## 2019-10-03 DIAGNOSIS — N393 Stress incontinence (female) (male): Secondary | ICD-10-CM | POA: Diagnosis not present

## 2019-10-03 DIAGNOSIS — N183 Chronic kidney disease, stage 3 unspecified: Secondary | ICD-10-CM

## 2019-10-03 DIAGNOSIS — M545 Low back pain, unspecified: Secondary | ICD-10-CM | POA: Insufficient documentation

## 2019-10-03 DIAGNOSIS — I129 Hypertensive chronic kidney disease with stage 1 through stage 4 chronic kidney disease, or unspecified chronic kidney disease: Secondary | ICD-10-CM

## 2019-10-03 DIAGNOSIS — I10 Essential (primary) hypertension: Secondary | ICD-10-CM | POA: Diagnosis not present

## 2019-10-03 DIAGNOSIS — E1122 Type 2 diabetes mellitus with diabetic chronic kidney disease: Secondary | ICD-10-CM | POA: Diagnosis not present

## 2019-10-03 DIAGNOSIS — Z8719 Personal history of other diseases of the digestive system: Secondary | ICD-10-CM

## 2019-10-03 HISTORY — DX: Stress incontinence (female) (male): N39.3

## 2019-10-03 LAB — POCT URINALYSIS DIP (MANUAL ENTRY)
Blood, UA: NEGATIVE
Glucose, UA: 250 mg/dL — AB
Leukocytes, UA: NEGATIVE
Nitrite, UA: NEGATIVE
Protein Ur, POC: NEGATIVE mg/dL
Spec Grav, UA: 1.02 (ref 1.010–1.025)
Urobilinogen, UA: 1 E.U./dL
pH, UA: 6 (ref 5.0–8.0)

## 2019-10-03 MED ORDER — CYCLOBENZAPRINE HCL 10 MG PO TABS: 10.0000 mg | ORAL_TABLET | Freq: Three times a day (TID) | ORAL | 0 refills | Status: DC | PRN

## 2019-10-03 MED ORDER — OMEPRAZOLE 20 MG PO CPDR: 20.0000 mg | DELAYED_RELEASE_CAPSULE | Freq: Every day | ORAL | 0 refills | Status: DC

## 2019-10-03 MED ORDER — CHLORTHALIDONE 25 MG PO TABS: 25.0000 mg | ORAL_TABLET | Freq: Every day | ORAL | 3 refills | Status: DC

## 2019-10-03 NOTE — Assessment & Plan Note (Signed)
Patient sugars appear to be mostly stable and at goal.  She does report some dietary indiscretions.  We will continue to follow her sugars over the next couple of months.  Her next A1c is due in March 2021.  We will plan for follow-up at that time.  No changes in medication today.

## 2019-10-03 NOTE — Progress Notes (Signed)
  Subjective  Alexandra Campos is a 64 y.o. female who presents today with the following problems:  Back Pain  Acute pain in lower back. Pain is on right lower back.  Patient reports that it happened when she was getting up and was on her way to her son's room.  She reports excruciating pain at that time.  It is relieved with laying down.  Urinary incontinence Patient has suffered from urinary incontinence x10 years.  She reports that she has tried plenty of medications in the past and has not had any benefit.  She has been wearing pads and is requesting for refill.  Hypertension  Patient continues to take blood pressures daily and her pressures have mostly been under [id number] systolic and 90 diastolic.  Diabetes Patient reports some dietary indiscretions over the holiday..  Her sugars range from 96-177.  She reports compliance with her medications.  She is also lost 6 pounds since her last visit.  She is going to be getting a treadmill and looks forward to being able to exercise more.  She denies any hypoglycemia  Objective  Physical Exam BP 108/62   Pulse 91   Wt 211 lb 12.8 oz (96.1 kg)   SpO2 100%   BMI 41.36 kg/m  Well-appearing female no acute distress.  No bilateral lower extremity edema  Assessment & Plan    Problem List Items Addressed This Visit      Active Problems   Hypertension - Primary    Patient's blood pressures have improved greatly. Patient can continue to take blood pressures at home about once weekly.  No changes in medication today.      Relevant Medications   chlorthalidone (HYGROTON) 25 MG tablet   Type 2 DM with CKD stage 3 and hypertension ([place])    Patient sugars appear to be mostly stable and at goal.  She does report some dietary indiscretions.  We will continue to follow her sugars over the next couple of months.  Her next A1c is due in March 2021.  We will plan for follow-up at that time.  No changes in medication today.      Relevant Medications   chlorthalidone (HYGROTON) 25 MG tablet   History of esophageal reflux   Relevant Medications   omeprazole (PRILOSEC) 20 MG capsule   Acute right-sided low back pain without sciatica    Patient with acute onset back pain that is consistent with muscle strain.  She does not have any saddle anesthesia, bowel incontinence.  She does have bladder incontinence at baseline.  She thought that maybe this could be due to a UTI so we checked urine today which was negative for UTI.  Prescribing patient Flexeril with expectant management.  Patient to return if worsening or no resolution in 1 to 2 months.      Relevant Medications   cyclobenzaprine (FLEXERIL) 10 MG tablet   Stress incontinence, female    Has been dealing with this for over 10 years. She has tried medications in the past, but they didn't work. She has been using pads since. Was diagnosed while living in [place].  - continue using pads daily  - Continue Kegel exercises -Sending clinical note to [facility] today        Resolved Problems   RESOLVED: Dysuria   Relevant Orders   Urinalysis Dipstick   POCT urinalysis dipstick (Completed)     Wilber Oliphant, M.D.  5:07 PM 10/03/2019

## 2019-10-03 NOTE — Assessment & Plan Note (Signed)
Patient with acute onset back pain that is consistent with muscle strain.  She does not have any saddle anesthesia, bowel incontinence.  She does have bladder incontinence at baseline.  She thought that maybe this could be due to a UTI so we checked urine today which was negative for UTI.  Prescribing patient Flexeril with expectant management.  Patient to return if worsening or no resolution in 1 to 2 months.

## 2019-10-03 NOTE — Assessment & Plan Note (Addendum)
Has been dealing with this for over 10 years. She has tried medications in the past, but they didn't work. She has been using pads since. Was diagnosed while living in [place].  - continue using pads daily  - Continue Kegel exercises -Sending clinical note to [facility] [facility] today

## 2019-10-03 NOTE — Patient Instructions (Signed)
Dear Alexandra Campos,   It was good to see you! Thank you for taking your time to come in to be seen. Today, we discussed the following:   Back Pain  Take flexeril for muscle spasm   High Blood Pressure    Your blood pressures look great! Continue your medication. No changes today.   Diabetes  Continue to write down your sugars a few times a week and follow up for next diabetes appointment in 2 months.    Be well,   Zettie Cooley, M.D   [facility] [phone]  *Sign up for MyChart for instant access to your health profile, labs, orders, upcoming appointments or to contact your provider with questions*  ===================================================================================

## 2019-10-03 NOTE — Assessment & Plan Note (Signed)
Patient's blood pressures have improved greatly. Patient can continue to take blood pressures at home about once weekly.  No changes in medication today.

## 2019-10-16 ENCOUNTER — Telehealth: Payer: Self-pay

## 2019-10-16 ENCOUNTER — Other Ambulatory Visit: Payer: Self-pay | Admitting: Family Medicine

## 2019-10-16 DIAGNOSIS — I10 Essential (primary) hypertension: Secondary | ICD-10-CM

## 2019-10-16 NOTE — Telephone Encounter (Signed)
I do remember printing the encounter after Judeen Hammans had talked to [employer] on the phone during the patient visit.  Would you be able to call [employer] to see what else that they need from [place]? Thank you so much!

## 2019-10-16 NOTE — Telephone Encounter (Signed)
Patient calls nurse stating she still has not received her incontinence supplies yet. Patient stated she was seen on 1/19, and thought the paperwork was sent off then. I see in PCP notes where she did send clinical note to [employer] om 1/19.

## 2019-10-19 NOTE — Telephone Encounter (Signed)
Checked on this with Jerene Pitch and she found the original and re-sent today.Marland KitchenApril Zimmerman Rumple, CMA

## 2019-11-01 ENCOUNTER — Other Ambulatory Visit: Payer: Self-pay

## 2019-11-01 DIAGNOSIS — I10 Essential (primary) hypertension: Secondary | ICD-10-CM

## 2019-11-01 DIAGNOSIS — E1159 Type 2 diabetes mellitus with other circulatory complications: Secondary | ICD-10-CM

## 2019-11-03 MED ORDER — LOSARTAN POTASSIUM 50 MG PO TABS: 50.0000 mg | ORAL_TABLET | Freq: Every day | ORAL | 3 refills | Status: DC

## 2019-11-03 NOTE — Telephone Encounter (Signed)
Spoke to patient over the phone to confirm that she was taking this medication.  The medication had last been refilled by urgent care in August 2020.  I wanted to ensure that patient was taking medication prior to refill.  Reviewed notes and remember during her first visit that she had extra losartan pills at home which is why we did not refill at that time.  We will refill the losartan 50 mg with refills

## 2019-11-07 ENCOUNTER — Telehealth: Payer: Self-pay

## 2019-11-07 NOTE — Telephone Encounter (Signed)
Called patient to ask further symptoms.  Patient reports that the discharge is just brown and not white at all.  She reports that this is happened before and went away with yeast infection medication.  She also reports vaginal itching which is her most bothersome symptom.  Given vaginal discharge and patient's age, patient was agreeable to come in for pelvic exam.  She is due for Pap smear as well and has declined to get this done at recent visits.  Counseled patient on over-the-counter remedies for vaginal itching that she can use until she is seen on Thursday. No other complaints at this time. Appointment made for Thursday 4:10 ATC clinic.   Wilber Oliphant, M.D.  4:33 PM 11/07/2019

## 2019-11-07 NOTE — Telephone Encounter (Signed)
Patient calls nurse line and LVM reporting concerns of yeast infection. Patient states that she has whitish/brown discharge, itching and odor. Patient states that she would like rx sent into pharmacy. Patient also states that she does not have minutes on her phone and requests that we do not call her back and "just send the medication in".   To PCP to see if she is willing to send in medication or if she needs to schedule appointment.   Please advise  Talbot Grumbling, RN

## 2019-11-09 ENCOUNTER — Ambulatory Visit (INDEPENDENT_AMBULATORY_CARE_PROVIDER_SITE_OTHER): Payer: Medicaid Other | Admitting: Family Medicine

## 2019-11-09 ENCOUNTER — Other Ambulatory Visit (HOSPITAL_COMMUNITY)
Admission: RE | Admit: 2019-11-09 | Discharge: 2019-11-09 | Disposition: A | Discharge status: Home / Self Care | Payer: Medicaid Other | Source: Ambulatory Visit | Attending: Family Medicine | Admitting: Family Medicine

## 2019-11-09 ENCOUNTER — Other Ambulatory Visit: Payer: Self-pay

## 2019-11-09 VITALS — BP 124/78 | HR 86 | Wt 209.5 lb

## 2019-11-09 DIAGNOSIS — N898 Other specified noninflammatory disorders of vagina: Secondary | ICD-10-CM

## 2019-11-09 LAB — POCT URINALYSIS DIP (MANUAL ENTRY)
Bilirubin, UA: NEGATIVE
Glucose, UA: >=1000 mg/dL — AB
Ketones, POC UA: NEGATIVE mg/dL
Nitrite, UA: NEGATIVE
Protein Ur, POC: NEGATIVE mg/dL
Spec Grav, UA: 1.015 (ref 1.010–1.025)
Urobilinogen, UA: 0.2 E.U./dL
pH, UA: 6 (ref 5.0–8.0)

## 2019-11-09 LAB — POCT UA - MICROSCOPIC ONLY

## 2019-11-09 NOTE — Progress Notes (Signed)
   CHIEF COMPLAINT / HPI:  Vaginal discharge: Patient reports that for the past 3 to 4 days she has noticed a brown discharge when she wipes herself.  She denies any burning, abdominal pain, nausea or vomiting, burning with urination, no hematuria.  She reports no change in bowel movements and no blood noted in stool, no dark stools.  She denies any fevers, night sweats, unintentional weight loss.  She does report that her skin has been irritated due to a history of some boils in the area which she has been scratching.  Patient is not up-to-date on Pap smear and last completion seen 05/31/2012 in chart with no results to be reviewed.  Patient denies any abnormal Pap smears that she knows of. -Not currently sexually active -Patient with no known family history of endometrial cancer or breast cancer  PERTINENT  PMH / PSH:   OBJECTIVE: BP 124/78   Pulse 86   Wt 209 lb 8 oz (95 kg)   SpO2 99%   BMI 40.92 kg/m   General: NAD, pleasant Neck: Supple, no LAD Respiratory: normal work of breathing Neuro: CN II-XII grossly intact Psych: AOx3, appropriate affect GU/GYN: External genitalia within normal limits.  Vaginal mucosa pink, moist, normal rugae.  Nonfriable cervix without lesions, no discharge or bleeding noted on speculum exam. Exam performed in the presence of a chaperone.  ASSESSMENT / PLAN:  Vaginal discharge Unclear if patient is having vaginal bleeding giving no blood noted on exam and reported that she often has skin irritation.  Patient is postmenopausal and if any vaginal discharge that is concerning for bleeding continues patient will need endometrial biopsy to rule out any cancers.   -Pap smear performed today as she was not up-to-date.  -Patient with no family history of endometrial, breast cancer.  No associated B symptoms.  Will follow Pap smear closely in twin call patient with results we will determine if she may need to be placed in our [place] clinic for endometrial biopsy.   Strict return precautions discussed, patient voiced understanding.   [place] Crystelle Ferrufino, DO PGY-3, [facility]

## 2019-11-09 NOTE — Patient Instructions (Signed)
Thank you for coming to see me today. It was a pleasure! Today we talked about:   I will call you with your Pap smear results as soon as they are available.   Please follow-up as needed.  If you have any questions or concerns, please do not hesitate to call the office at [phone].  Take Care,   [place] Viveca Beckstrom, DO

## 2019-11-10 DIAGNOSIS — N898 Other specified noninflammatory disorders of vagina: Secondary | ICD-10-CM | POA: Insufficient documentation

## 2019-11-10 NOTE — Assessment & Plan Note (Signed)
Unclear if patient is having vaginal bleeding giving no blood noted on exam and reported that she often has skin irritation.  Patient is postmenopausal and if any vaginal discharge that is concerning for bleeding continues patient will need endometrial biopsy to rule out any cancers.   -Pap smear performed today as she was not up-to-date.  -Patient with no family history of endometrial, breast cancer.  No associated B symptoms.  Will follow Pap smear closely in twin call patient with results we will determine if she may need to be placed in our [place] clinic for endometrial biopsy.  Strict return precautions discussed, patient voiced understanding.

## 2019-11-13 ENCOUNTER — Telehealth: Payer: Self-pay | Admitting: *Deleted

## 2019-11-13 LAB — CYTOLOGY - PAP
Chlamydia: NEGATIVE
Comment: NEGATIVE
Comment: NEGATIVE
Comment: NEGATIVE
Comment: NORMAL
Diagnosis: NEGATIVE
High risk HPV: NEGATIVE
Neisseria Gonorrhea: NEGATIVE
Trichomonas: NEGATIVE

## 2019-11-13 NOTE — Telephone Encounter (Signed)
I read recommend that patient stay on losartan most importantly for kidney protection given her CKD.  But, additionally, high [id number] systolic is not ideal either. She could benefit from improved systolic pressures.   Wilber Oliphant, M.D.  5:15 PM 11/13/2019

## 2019-11-13 NOTE — Telephone Encounter (Signed)
Pt calls because she has not taken Losartan in 3 weeks (mix-up at the pharmacy).  Pt reports that her BP has not been over 139/81 and she wonders if she still needs to be on this medication. Christen Bame, CMA

## 2019-11-20 ENCOUNTER — Telehealth: Payer: Self-pay

## 2019-11-20 DIAGNOSIS — E1159 Type 2 diabetes mellitus with other circulatory complications: Secondary | ICD-10-CM

## 2019-11-20 DIAGNOSIS — I152 Hypertension secondary to endocrine disorders: Secondary | ICD-10-CM

## 2019-11-20 DIAGNOSIS — I10 Essential (primary) hypertension: Secondary | ICD-10-CM

## 2019-11-20 NOTE — Telephone Encounter (Signed)
Patient calls nurse line to follow up on previous message. Patient wants to know if she needs to be on any medication.   Talbot Grumbling, RN

## 2019-11-20 NOTE — Telephone Encounter (Signed)
Patient calls nurse line regarding results of pap smear on 2/25. Informed patient of negative results per Dr. Julianne Rice note. Patient states that we have to be missing something because she is still having the vaginal itching and brown, dry discharge. Patient also continues to report increased urgency and pressure with urination.  Patient states that she discussed abx treatment with Dr. Enid Derry, however they were waiting for the results of the PAP.   To PCP  Please advise  Talbot Grumbling, RN

## 2019-11-21 MED ORDER — FLUCONAZOLE 150 MG PO TABS: 150.0000 mg | ORAL_TABLET | Freq: Once | ORAL | 0 refills | Status: AC

## 2019-11-21 MED ORDER — AMLODIPINE BESYLATE 10 MG PO TABS: 10.0000 mg | ORAL_TABLET | Freq: Every day | ORAL | 3 refills | Status: DC

## 2019-11-21 MED ORDER — LOSARTAN POTASSIUM 50 MG PO TABS: 50.0000 mg | ORAL_TABLET | Freq: Every day | ORAL | 3 refills | Status: DC

## 2019-11-21 NOTE — Telephone Encounter (Signed)
I just spoke to Dr. Enid Derry over the phone to ask about the visit. Final plan: I will call the patient and send her diflucan. I will schedule her for colpo clinic given brown discharge to determine if she may benefit from endometrial biopsy.

## 2019-11-21 NOTE — Telephone Encounter (Signed)
1. Abnormal discharge, itching, bladder fullness Patient continues to have brown discharge, itching and feelings of fullness and pressure of her bladder.  Per provider notes, patient denied other urinary symptoms.  1+ yeast found but dirty catch. Will treat patient for presumed yeast infection given itching in yeast on UA. Sending Diflucan x1 Patient to follow-up for further evaluation.  Initially tried to schedule with colposcopy, however patient has issues getting to the office and wanted to schedule appointments in the same morning or afternoon. (She also wanted to talk about facial hair growth at the dermatology clinic).  I scheduled patient to be seen with me in access to care on Friday, 11/24/2019 to focus on vaginal discharge as this is a primary concern.  She is aware that we may not be able to get to her facial growth but we can follow-up with a virtual visit if needed.  2. Pads  Spoke with patient about inability to get packs for [employer]flow.  Patient reports that she called over to them and they said they do not take Medicare.  Nobody has told this to her since October.  She spoke to another company who will be reaching out to [place] for the pads.  We will no longer be working with [employer] for this patient.  3. BP pill  Patient sent a message earlier about whether or not to take her losartan.  I clarified with patient that I would like her to be on the losartan for kidney benefit.  She reports that she is not using [facility] any longer and would like to change her pharmacy to [facility] in [place].  She is also requesting amlodipine refill.   Wilber Oliphant, M.D.  10:27 AM 11/21/2019

## 2019-11-24 ENCOUNTER — Other Ambulatory Visit: Payer: Self-pay

## 2019-11-24 ENCOUNTER — Ambulatory Visit (INDEPENDENT_AMBULATORY_CARE_PROVIDER_SITE_OTHER): Payer: Medicaid Other | Admitting: Family Medicine

## 2019-11-24 VITALS — BP 126/78 | HR 86 | Wt 211.8 lb

## 2019-11-24 DIAGNOSIS — L678 Other hair color and hair shaft abnormalities: Secondary | ICD-10-CM

## 2019-11-24 DIAGNOSIS — N898 Other specified noninflammatory disorders of vagina: Secondary | ICD-10-CM

## 2019-11-24 DIAGNOSIS — N952 Postmenopausal atrophic vaginitis: Secondary | ICD-10-CM | POA: Diagnosis not present

## 2019-11-24 HISTORY — DX: Other hair color and hair shaft abnormalities: L67.8

## 2019-11-24 LAB — POCT WET PREP (WET MOUNT)
Clue Cells Wet Prep Whiff POC: NEGATIVE
Trichomonas Wet Prep HPF POC: ABSENT

## 2019-11-24 NOTE — Progress Notes (Signed)
    SUBJECTIVE:   CHIEF COMPLAINT / HPI:  Vaginal discharge (improved), continued vaginal itching  Patient continues to have some vaginal discharge.  Has not had any continued brown discharge.  She also has itching that is located more superiorly around her urethra rather than perennially.  She is not currently sexually active and does not endorse any dyspareunia.  Facial hair  Patient reports worsening facial hair on her chin.  She has been bothered by this as she is still interested in meeting a new partner.  She has been using tweezers.  She reports that her sisters and mom have the same issue but she seems to have more hair than they do.  PERTINENT  PMH / PSH: postmenopausal.   OBJECTIVE:   BP 126/78   Pulse 86   Wt 211 lb 12.8 oz (96.1 kg)   SpO2 100%   BMI 41.36 kg/m   GU: External vulva and vagina nonerythematous, without any obvious lesions or rash. No abnormal discharge appreciated in vaginal canal. Minimal ruggae of vaginal walls. Vaginal wall is friable and expansion of vaginal wall elicits small tears and mild amounts of bleeding picked up on fox swab. Otherwise, no abnormal appearing lesions on vaginal mucosa.  There is no cervical motion tenderness on gross abnormalities on bimanual exam. Rectovaginal exam negative for fistula formation.   ASSESSMENT/PLAN:   Vaginal itching Patient returning to care for vaginal complaint.  She reports taking Diflucan after our telephone visit the other day and has had increased discharge but continued vaginal itching.  Of most concern, patient reported having brown-ish discharge and saw Dr. Enid Derry in clinic smear results were normal. Can consider that vaginal symptoms are due to yeast infection.  Yeast infection can cause some brownish discharge with irritation of the vaginal mucosa.  Today on exam, patient's vaginal walls are friable there are 3 areas of easily torn tissue with speculum and digital examination.  Differential includes  vulvar lichen sclerosus, Lichen planus and atrophic dermatitis, atrophic vaginitis.  Less likely to be sexually transmitted disease given recent negative testing and is not currently sexually active.  I suspect that patient has atrophic vaginitis given symptoms and friable mucosa on physical exam with no obvious rash or other skin or mucosal lesions.  Will start treatment with vaginal lubricants and moisturizers.  If no improvement, can discuss using topical estrogen therapy in the future.  Patient encouraged to use these lubricants and moisturizers prior to sexual activity to limit pain. Patient provided return precautions.  Case discussed with Dr. Andria Frames.   Abnormal facial hair Reassured patient that symptoms are likely due to being postmenopausal and are very common.  Patient can continue to pluck hairs or wax, whichever her preference.  No other prescription therapy is indicated for this.  Patient understanding and has no further questions at this time.   Wilber Oliphant, MD [facility]

## 2019-11-24 NOTE — Patient Instructions (Addendum)
Dear Alexandra Campos,   It was good to see you! Thank you for taking your time to come in to be seen. Today, we discussed the following:  Atrophic Vaginitis: Very common in women who are postmenopausal as a result of decreased estrogen to the vaginal tissue.  Treatment for this is vaginal lubricants moisturizers.  I have listed a few of the options below.  These can be found over-the-counter.  If this does not help, we can consider use of hormonal topical creams in the future, however, we always start with conservative management given the risks of hormonal creams.  water-based lubricants, such as  Astroglide Liquid  Astroglide Gel Liquid  Astroglide  Just Like Me  K-Y Jelly  Pre-Seed  Slippery Stuff (also propylene glycol-free)  Liquid Silk  silicone-based lubricants, such as  Astroglide X  ID Millennium  K-Y Intrigue  Pink  Pjur Eros  iso-osmolar lubricants, such as  Good Clean Love (also propylene glycol-free)  PRE  propylene glycol-free lubricants, such as  Sliquid H2O  Pjur Woman Bodyglide  oil-based lubricants, such as  Elegance Women's Lubricants  olive oil  moisturizers, such as  Replens  Me Again  Vagisil  Feminease  K-Y SILK-E  Luvena  Silken Secret    If you do not have any improvement in 3 to 4 weeks, please come back for further treatment.  Be well,   Zettie Cooley, M.D   [facility]. [phone]  *Sign up for MyChart for instant access to your health profile, labs, orders, upcoming appointments or to contact your provider with questions*  ===================================================================================  Atrophic Vaginitis Atrophic vaginitis is a condition in which the tissues that line the vagina become dry and thin. This condition occurs in women who have stopped having their period. It is caused by a drop in a female hormone (estrogen). This hormone helps:  To keep the vagina  moist.  To make a clear fluid. This clear fluid helps: ? To make the vagina ready for sex. ? To protect the vagina from infection. If the lining of the vagina is dry and thin, it may cause irritation, burning, or itchiness. It may also:  Make sex painful.  Make an exam of your vagina painful.  Cause bleeding.  Make you lose interest in sex.  Cause a burning feeling when you pee (urinate).  Cause a brown or yellow fluid to come from your vagina. Some women do not have symptoms. Follow these instructions at home: Medicines  Take over-the-counter and prescription medicines only as told by your doctor.  Do not use herbs or other medicines unless your doctor says it is okay.  Use medicines for for dryness. These include: ? Oils to make the vagina soft. ? Creams. ? Moisturizers. General instructions  Do not douche.  Do not use products that can make your vagina dry. These include: ? Scented sprays. ? Scented tampons. ? Scented soaps.  Sex can help increase blood flow and soften the tissue in the vagina. If it hurts to have sex: ? Tell your partner. ? Use products to make sex more comfortable. Use these only as told by your doctor. Contact a doctor if you:  Have discharge from the vagina that is different than usual.  Have a bad smell coming from your vagina.  Have new symptoms.  Do not get better.  Get worse. Summary  Atrophic vaginitis is a condition in which the lining of the vagina becomes dry and thin.  This condition affects  women who have stopped having their periods.  Treatment may include using products that help make the vagina soft.  Call a doctor if do not get better with treatment. This information is not intended to replace advice given to you by your health care provider. Make sure you discuss any questions you have with your health care provider. Document Revised: 09/13/2017 Document Reviewed: 09/13/2017 Elsevier Patient Education  2020  [employer].

## 2019-11-24 NOTE — Assessment & Plan Note (Addendum)
Reassured patient that symptoms are likely due to being postmenopausal and are very common.  Patient can continue to pluck hairs or wax, whichever her preference.  No other prescription therapy is indicated for this.  Patient understanding and has no further questions at this time.

## 2019-11-24 NOTE — Assessment & Plan Note (Addendum)
Patient returning to care for vaginal complaint.  She reports taking Diflucan after our telephone visit the other day and has had increased discharge but continued vaginal itching.  Of most concern, patient reported having brown-ish discharge and saw Dr. Enid Derry in clinic smear results were normal. Can consider that vaginal symptoms are due to yeast infection.  Yeast infection can cause some brownish discharge with irritation of the vaginal mucosa.  Today on exam, patient's vaginal walls are friable there are 3 areas of easily torn tissue with speculum and digital examination.  Differential includes vulvar lichen sclerosus, Lichen planus and atrophic dermatitis, atrophic vaginitis.  Less likely to be sexually transmitted disease given recent negative testing and is not currently sexually active.  I suspect that patient has atrophic vaginitis given symptoms and friable mucosa on physical exam with no obvious rash or other skin or mucosal lesions.  Will start treatment with vaginal lubricants and moisturizers.  If no improvement, can discuss using topical estrogen therapy in the future.  Patient encouraged to use these lubricants and moisturizers prior to sexual activity to limit pain. Patient provided return precautions.  Case discussed with Dr. Andria Frames.

## 2019-12-01 ENCOUNTER — Other Ambulatory Visit: Payer: Self-pay

## 2019-12-01 DIAGNOSIS — E119 Type 2 diabetes mellitus without complications: Secondary | ICD-10-CM

## 2019-12-03 MED ORDER — METFORMIN HCL 1000 MG PO TABS: 1000.0000 mg | ORAL_TABLET | Freq: Two times a day (BID) | ORAL | 3 refills | Status: DC

## 2019-12-12 ENCOUNTER — Other Ambulatory Visit: Payer: Self-pay

## 2019-12-12 ENCOUNTER — Encounter: Payer: Self-pay | Admitting: Family Medicine

## 2019-12-12 DIAGNOSIS — R32 Unspecified urinary incontinence: Secondary | ICD-10-CM | POA: Insufficient documentation

## 2019-12-12 DIAGNOSIS — I10 Essential (primary) hypertension: Secondary | ICD-10-CM

## 2019-12-13 MED ORDER — ATORVASTATIN CALCIUM 40 MG PO TABS: 40.0000 mg | ORAL_TABLET | Freq: Every day | ORAL | 2 refills | Status: DC

## 2019-12-13 MED ORDER — EMPAGLIFLOZIN 10 MG PO TABS: 10.0000 mg | ORAL_TABLET | Freq: Every day | ORAL | 3 refills | Status: DC

## 2019-12-22 ENCOUNTER — Other Ambulatory Visit: Payer: Self-pay

## 2019-12-22 DIAGNOSIS — Z8719 Personal history of other diseases of the digestive system: Secondary | ICD-10-CM

## 2019-12-22 MED ORDER — OMEPRAZOLE 20 MG PO CPDR: 20.0000 mg | DELAYED_RELEASE_CAPSULE | Freq: Every day | ORAL | 0 refills | Status: DC

## 2019-12-22 MED ORDER — ALBUTEROL SULFATE HFA 108 (90 BASE) MCG/ACT IN AERS: 2.0000 | INHALATION_SPRAY | Freq: Four times a day (QID) | RESPIRATORY_TRACT | 1 refills | Status: DC | PRN

## 2020-01-04 ENCOUNTER — Encounter: Payer: Self-pay | Admitting: Family Medicine

## 2020-01-07 IMAGING — CR DG WRIST COMPLETE 3+V*R*
1 series · 4 of 4 positions shown · non-contrast
Comparison: None.

CLINICAL DATA: Chronic right wrist pain.

EXAM:
RIGHT WRIST - COMPLETE 3+ VIEW

[Series 1: dg wrist complete right · 0.14mm/px · 4 of 4 slices shown]
[im 1/4]
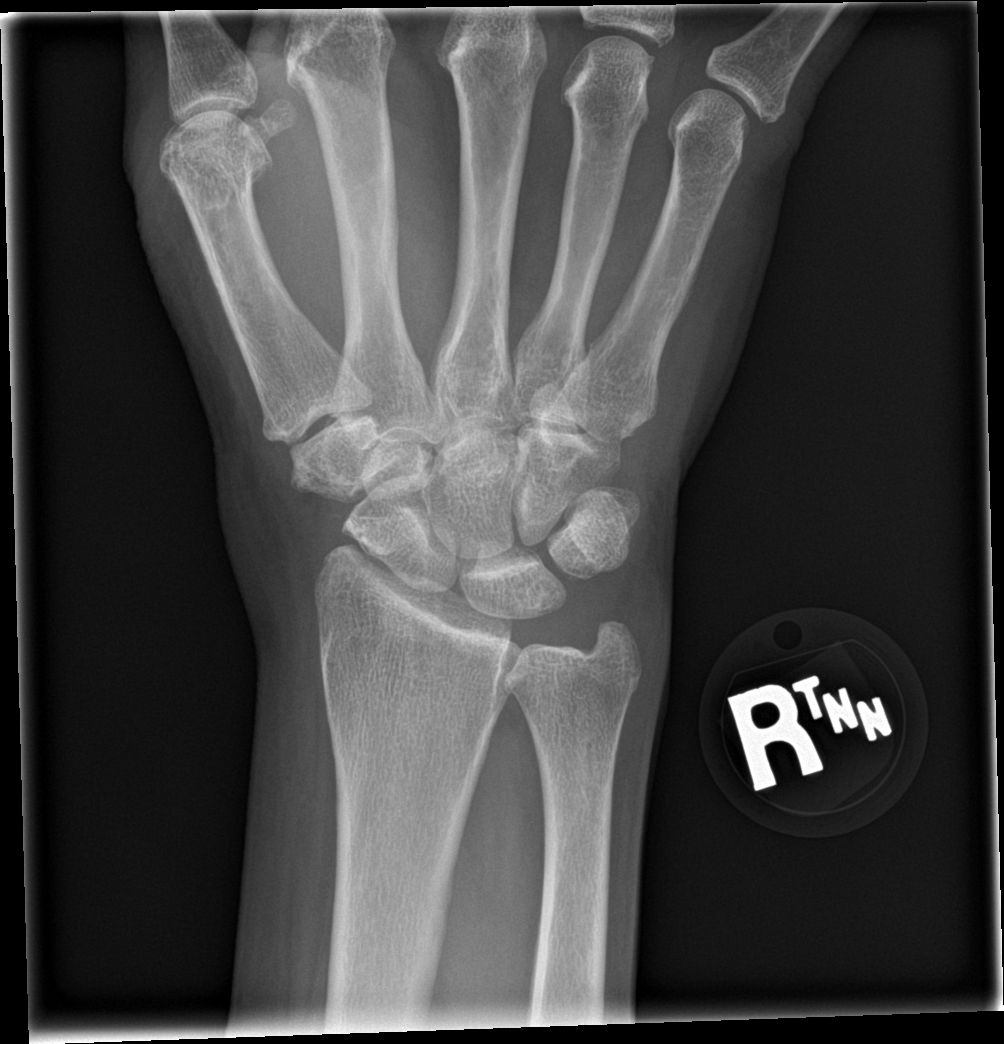
[im 2/4]
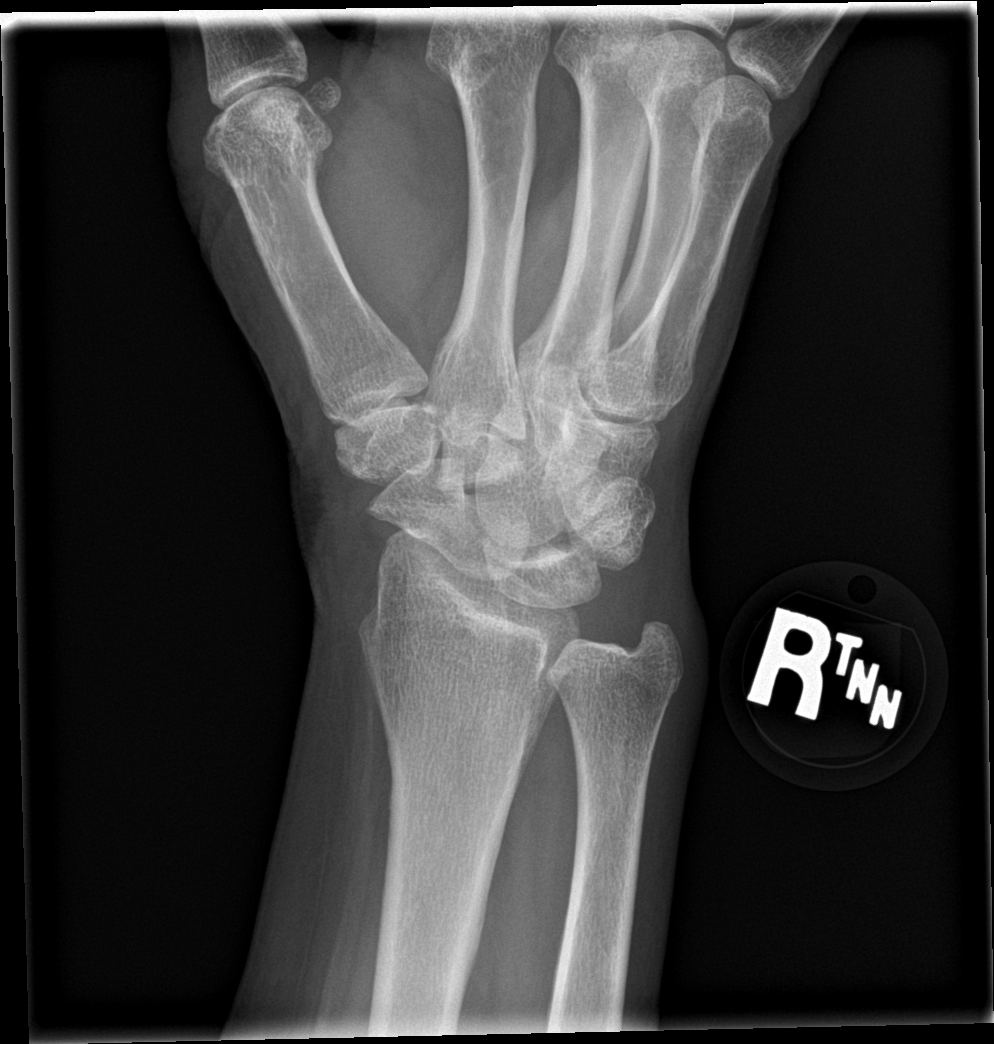
[im 3/4]
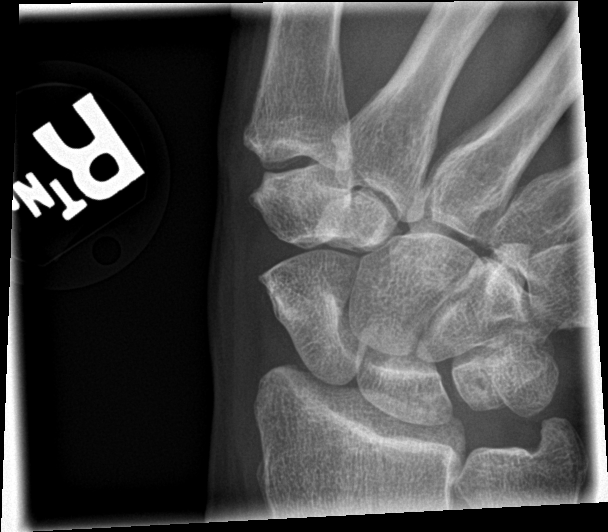
[im 4/4]
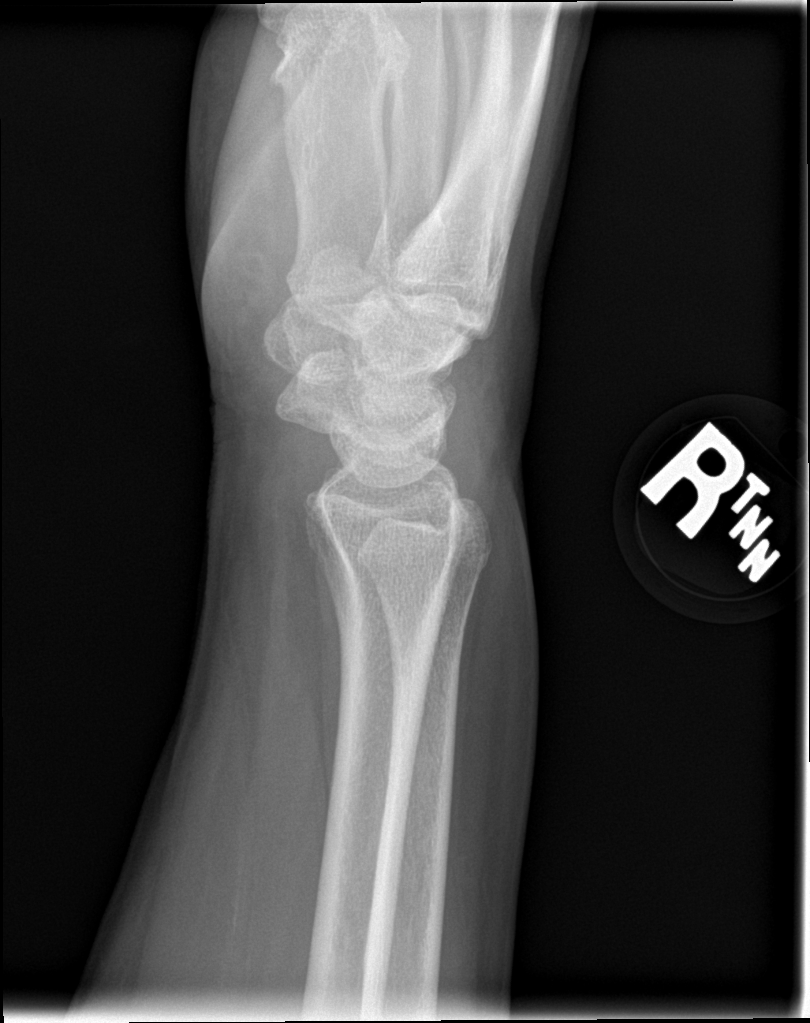

[4 of 4 positions shown; findings below may reference images not displayed]

FINDINGS: There is no evidence of fracture or dislocation. There is no
evidence of arthropathy or other focal bone abnormality. Soft
tissues are unremarkable.
IMPRESSION: Negative.

## 2020-01-11 ENCOUNTER — Encounter: Payer: Self-pay | Admitting: Family Medicine

## 2020-01-11 ENCOUNTER — Ambulatory Visit (INDEPENDENT_AMBULATORY_CARE_PROVIDER_SITE_OTHER): Payer: Medicaid Other | Admitting: Family Medicine

## 2020-01-11 ENCOUNTER — Other Ambulatory Visit: Payer: Self-pay

## 2020-01-11 VITALS — BP 126/68 | HR 87 | Ht 60.0 in | Wt 210.2 lb

## 2020-01-11 DIAGNOSIS — N183 Chronic kidney disease, stage 3 unspecified: Secondary | ICD-10-CM

## 2020-01-11 DIAGNOSIS — M79662 Pain in left lower leg: Secondary | ICD-10-CM | POA: Diagnosis not present

## 2020-01-11 DIAGNOSIS — N1831 Chronic kidney disease, stage 3a: Secondary | ICD-10-CM | POA: Diagnosis present

## 2020-01-11 DIAGNOSIS — N898 Other specified noninflammatory disorders of vagina: Secondary | ICD-10-CM

## 2020-01-11 DIAGNOSIS — R6889 Other general symptoms and signs: Secondary | ICD-10-CM

## 2020-01-11 DIAGNOSIS — M79661 Pain in right lower leg: Secondary | ICD-10-CM | POA: Diagnosis not present

## 2020-01-11 DIAGNOSIS — I129 Hypertensive chronic kidney disease with stage 1 through stage 4 chronic kidney disease, or unspecified chronic kidney disease: Secondary | ICD-10-CM

## 2020-01-11 DIAGNOSIS — E1122 Type 2 diabetes mellitus with diabetic chronic kidney disease: Secondary | ICD-10-CM

## 2020-01-11 NOTE — Progress Notes (Signed)
    SUBJECTIVE:  CHIEF COMPLAINT / HPI:   Vaginal discharge and itching  Patient reports that she continues to have vaginal itching as mentioned and the last few visits.  She believes it is due to [place].  We itching is around her vulva, anteriorly near the urethra and clitoris and labia.  She has used refresh gel without any improvement.  Leg Pain Patient reports that she has having leg pain bilaterally.  She reports that she can go and walk around the grocery store and by the time that she leaves, her legs are very painful.   PERTINENT  PMH / PSH: Tobacco use, CKD 2, elevated BMI, hypertension  OBJECTIVE:  BP 126/68   Pulse 87   Ht 5' (1.524 m)   Wt 210 lb 3.2 oz (95.3 kg)   SpO2 99%   BMI 41.05 kg/m   Deferred pelvic exam.   Diabetic foot exam: Upon inspection there are no lesions, rash, macerated areas between toes, bony deformities, bunions. DPs are difficult to palpate bilaterally.  There is no loss of sensation to monofilament touch or pinprick sensation bilaterally at sites at high risk for ulceration. Patient has a normal gait and joint mobility.  Patient has preserved great toe proprioception bilaterally.  ASSESSMENT/PLAN:  Vaginal itching Patient is concerned that vaginal itching is due to Jardiance which I do not believe is a side effect.  She has been treated for yeast infections recently and had no improvement in her symptoms.  Will discontinue Jardiance at this time to monitor for improvement.  If no improvement, will represcribe Jardiance.  Referral to gynecology for vaginal itching that has not improved with initial conservative treatment and other tests have returned negative.  Concern for atrophic vaginitis.  Abnormal ankle brachial index (ABI) Abnormal values bilaterally.  DPs difficult to palpate.  Referral to vein vascular further evaluation of possible PAD  Type 2 DM with CKD stage 3 and hypertension ([place]) Recheck labs today   Wilber Oliphant, MD [place]

## 2020-01-11 NOTE — Patient Instructions (Addendum)
Dear Alexandra Campos,   It was good to see you! Thank you for taking your time to come in to be seen. Today, we discussed the following:   For the vaginal itching, I am going to send you to gynecology for further diagnosis and work-up. We will also try discontinuing her Jardiance for this time.  Please do not throughout the medication as there is a chance that we do restart it in the future.   You also reported that you had some leg pain with walking at the grocery store.  Your ABI test which measured your blood flow in your legs showed possible severe disease.  I am sending you to a vascular specialist for further evaluation.  Please make an appointment with me to fix your ingrown toenail. Make sure it is a 40 minute appointment    For all labs obtained today, I will send results via [place].  If anything is abnormal, we will call you for details on further management.  Please bring all of your medications to your next appointment.   You are due for the following Health Maintenance items. Please schedule an appointment to address these prior to leaving.   Be well,   Zettie Cooley, M.D   [facility] [phone]  *Sign up for MyChart for instant access to your health profile, labs, orders, upcoming appointments or to contact your provider with questions*  ===================================================================================  Intermittent Claudication Intermittent claudication is pain in one or both legs that occurs when walking or exercising and goes away when resting. Intermittent claudication is a symptom of peripheral arterial disease (PAD). This condition is commonly treated with rest, medicine, and healthy lifestyle changes. If medical management does not improve symptoms, surgery can be done to restore blood flow (revascularization) to the affected leg. What are the causes?  This condition is caused by buildup of fatty material (plaque) within the major  arteries in the body (atherosclerosis). Plaque makes arteries stiff and narrow, which prevents enough blood from reaching the leg muscles. Pain occurs when you walk or exercise because your muscles need (but cannot get) more blood when you are moving and exercising. What increases the risk? The following factors may make you more likely to develop this condition:  A family history of atherosclerosis.  A personal history of stroke or heart disease.  Older age.  Being inactive (sedentary lifestyle).  Being overweight.  Smoking cigarettes.  Having another health condition such as: ? Diabetes. ? High blood pressure. ? High cholesterol. What are the signs or symptoms? Symptoms of this condition may first develop in the lower leg, and then they may spread to the thigh, hip, buttock, or the back of the lower leg (calf) over time. Symptoms may include:  Aches or pains.  Cramps.  A feeling of tightness, weakness, or heaviness.  A wound on the lower leg or foot that heals poorly or does not heal. How is this diagnosed? This condition may be diagnosed based on:  Your symptoms.  Your medical history.  Tests, such as: ? Blood tests. ? Arterial duplex ultrasound. This test uses images of blood vessels and surrounding organs to evaluate blood flow within arteries. ? Angiogram. In this procedure, dye is injected into arteries and then X-rays are taken. ? Magnetic resonance angiogram (MRA). In this procedure, strong magnets and radio waves are used instead of X-rays to create images of blood vessels and blood flow. ? CT angiogram (CTA). In this procedure, a large X-ray machine called  a CT scanner takes detailed pictures of blood vessels that have been injected with dye. ? Ankle-brachial index (ABI) test. This procedure measures blood pressure in the leg during exercise and at rest. ? Exercise test. For this test, you will walk on a treadmill while tests are done (such as the ABI test) to  evaluate how this condition affects your ability to walk or exercise. How is this treated? Treatment for this condition may involve treatment for the underlying cause, such as treatment for high blood pressure, high cholesterol, or diabetes. Treatment may include:  Lifestyle changes such as: ? Starting a supervised or home-based exercise program. ? Losing weight. ? Quitting smoking.  Medicines to help restore blood flow through your legs.  Blood vessel surgery (angioplasty) to restore blood flow around the blocked vessel. This is also known as endovascular therapy (EVT). This is only done if your intermittent claudication is caused by severe peripheral artery disease, a condition in which blood flow is severely or totally restricted by the narrowing of the arteries. Follow these instructions at home: Lifestyle   Maintain a healthy weight.  Eat a diet that is low in saturated fats and calories. Consider working with a diet and nutrition specialist (dietitian) to help you make healthy food choices.  Do not use any products that contain nicotine or tobacco, such as cigarettes and e-cigarettes. If you need help quitting, ask your health care provider.  If your health care provider recommended an exercise program for you, follow it as directed. Your exercise program may involve: ? Walking 3 or more times a week. ? Walking until you have certain symptoms of intermittent claudication. ? Resting until symptoms go away. ? Gradually increasing your walking time to about 50 minutes a day. General instructions  Work with your health care provider to manage any other health conditions you may have, including diabetes, high blood pressure, or high cholesterol.  Take over-the-counter and prescription medicines only as told by your health care provider.  Keep all follow-up visits as told by your health care provider. This is important. Contact a health care provider if:  Your pain does not go  away with rest.  You have sores on your legs that do not heal or have a bad smell or pus coming from them.  Your condition gets worse or does not get better with treatment. Get help right away if:  You have chest pain.  You have difficulty breathing.  You develop arm weakness.  You have trouble speaking.  Your face begins to droop.  Your foot or leg is cold or it changes color.  Your foot or leg becomes numb. These symptoms may represent a serious problem that is an emergency. Do not wait to see if the symptoms will go away. Get medical help right away. Call your local emergency services (911 in the [place] Do not drive yourself to the hospital.  Summary  Intermittent claudication is pain in one or both legs that occurs when walking or exercising and goes away when resting.  This condition is caused by buildup of fatty material (plaque) within the major arteries in the body (atherosclerosis). Plaque makes arteries stiff and narrow, which prevents enough blood from reaching the leg muscles.  Intermittent claudication can be treated with medicine and lifestyle changes. If medical treatment fails, surgery can be done to help return blood flow to the affected area.  Make sure you work with your health care provider to manage any other health conditions you may  have, including diabetes, high blood pressure, or high cholesterol. This information is not intended to replace advice given to you by your health care provider. Make sure you discuss any questions you have with your health care provider. Document Revised: 08/13/2017 Document Reviewed: 10/01/2016 Elsevier Patient Education  2020 [employer].

## 2020-01-12 ENCOUNTER — Telehealth: Payer: Self-pay

## 2020-01-12 LAB — BASIC METABOLIC PANEL
BUN/Creatinine Ratio: 21 (ref 12–28)
BUN: 24 mg/dL (ref 8–27)
CO2: 24 mmol/L (ref 20–29)
Calcium: 10.1 mg/dL (ref 8.7–10.3)
Chloride: 101 mmol/L (ref 96–106)
Creatinine, Ser: 1.16 mg/dL — ABNORMAL HIGH (ref 0.57–1.00)
GFR calc Af Amer: 58 mL/min/{1.73_m2} — ABNORMAL LOW (ref >59)
GFR calc non Af Amer: 51 mL/min/{1.73_m2} — ABNORMAL LOW (ref >59)
Glucose: 141 mg/dL — ABNORMAL HIGH (ref 65–99)
Potassium: 3.7 mmol/L (ref 3.5–5.2)
Sodium: 140 mmol/L (ref 134–144)

## 2020-01-12 LAB — LIPID PANEL
Chol/HDL Ratio: 2.3 ratio (ref 0.0–4.4)
Cholesterol, Total: 139 mg/dL (ref 100–199)
HDL: 60 mg/dL (ref >39)
LDL Chol Calc (NIH): 55 mg/dL (ref 0–99)
Triglycerides: 142 mg/dL (ref 0–149)
VLDL Cholesterol Cal: 24 mg/dL (ref 5–40)

## 2020-01-12 NOTE — Telephone Encounter (Signed)
Patient calls nurse line requesting to make a toenail removal apt. Patient needs a 53min slot, per procedure protocol. I scheduled her for a 3:50pm apt on 5/18. I realize this is technically not a 40 min slot, just the last apt of the day for PCP. This was the best I could do until June, and patient did not want to wait that long. Please advise if ok, if not, I can reschedule her with another provider.

## 2020-01-12 NOTE — Telephone Encounter (Signed)
That is perfect.  Thank you

## 2020-01-14 ENCOUNTER — Encounter: Payer: Self-pay | Admitting: Family Medicine

## 2020-01-14 DIAGNOSIS — R6889 Other general symptoms and signs: Secondary | ICD-10-CM

## 2020-01-14 HISTORY — DX: Other general symptoms and signs: R68.89

## 2020-01-14 NOTE — Assessment & Plan Note (Signed)
Recheck labs today. 

## 2020-01-14 NOTE — Assessment & Plan Note (Signed)
Abnormal values bilaterally.  DPs difficult to palpate.  Referral to vein vascular further evaluation of possible PAD

## 2020-01-14 NOTE — Assessment & Plan Note (Addendum)
Patient is concerned that vaginal itching is due to [facility] which I do not believe is a side effect.  She has been treated for yeast infections recently and had no improvement in her symptoms.  Will discontinue Jardiance at this time to monitor for improvement.  If no improvement, will represcribe Jardiance.  Referral to gynecology for vaginal itching that has not improved with initial conservative treatment and other tests have returned negative.  Concern for atrophic vaginitis.

## 2020-01-14 NOTE — Assessment & Plan Note (Signed)
Patient's GFR and creatinine are stable from last year.  We will continue to monitor and refer to [facility] as appropriate. If no longer continuing jardiance, consider other sglt2 or glp1

## 2020-01-16 ENCOUNTER — Telehealth: Payer: Self-pay

## 2020-01-16 NOTE — Telephone Encounter (Signed)
Patient calls nurse line stating she purchased Nicorette Gum OTC yesterday and would like to know if this is a safe option for her for smoking cessation. Patient stated she was reading the box warning for patients with diabetes. Patient would like PCPs advice. Will forward.

## 2020-01-17 NOTE — Telephone Encounter (Signed)
Patient LVM on nurse line checking the status.

## 2020-01-17 NOTE — Telephone Encounter (Signed)
Patient has been contacted and we discussed PCPs recommendation. Patient stated she does check her CBGs at home and will be mindful of increased CBGs once she starts the nicotine gum. Patient verbalized understanding to seek immediate medical attention if she develops signs of hyperglycemia.

## 2020-01-17 NOTE — Telephone Encounter (Signed)
Entered for the late response this message.  Patient can go ahead and use over-the-counter Nicorette gum.  It is more contraindicated in patients with insulin-dependent diabetes.  However, if patient starts to have symptoms of hyperglycemia, which include blurred vision, nausea, vomiting increased urination, increased thirst, changes in mental status, confusion, or if others around her note that she appears more tired or somnolent, she should go straight to the emergency department.  This is less common in patients who do not use insulin for their diabetes.  If she plans on using this long-term, please ask her to schedule a lab visit and we can do CBGs in the clinic a few times during her nicotine replacement therapy or if she has a machine at home, she can do CBGs at home.  If her blood sugars are consistently over 180-200, I would like her to stop taking the nicotine and come into the office.

## 2020-01-18 ENCOUNTER — Telehealth: Payer: Self-pay

## 2020-01-18 NOTE — Telephone Encounter (Signed)
Alexandra Campos from [facility] calls nurse line regarding fax being sent over on 01/15/20. Alexandra Campos states that this paper is a request for letter of medical necessity. Unable to find paper in provider box.   To PCP  Please fax to Alexandra Campos at [place], RN

## 2020-01-19 ENCOUNTER — Encounter: Payer: Self-pay | Admitting: Family Medicine

## 2020-01-19 NOTE — Progress Notes (Signed)
Received a fax from [employer] noting the need for quantity of 200/month.  Added this to letter, printed and faxed back.

## 2020-01-19 NOTE — Telephone Encounter (Signed)
Humberto Leep,   I already sent this letter a few weeks ago. It was a letter of necessity for both liners and pads on 01/04/20 encounter. I called [employer] and left a message to call me back.   Apolonio Schneiders

## 2020-01-23 ENCOUNTER — Encounter: Payer: Self-pay | Admitting: Family Medicine

## 2020-01-24 ENCOUNTER — Encounter: Payer: Self-pay | Admitting: Family Medicine

## 2020-01-30 ENCOUNTER — Encounter: Payer: Self-pay | Admitting: Family Medicine

## 2020-01-30 ENCOUNTER — Ambulatory Visit (INDEPENDENT_AMBULATORY_CARE_PROVIDER_SITE_OTHER): Payer: Medicaid Other | Admitting: Family Medicine

## 2020-01-30 ENCOUNTER — Other Ambulatory Visit: Payer: Self-pay

## 2020-01-30 VITALS — BP 132/72 | HR 97 | Wt 210.2 lb

## 2020-01-30 DIAGNOSIS — L600 Ingrowing nail: Secondary | ICD-10-CM | POA: Diagnosis present

## 2020-02-01 ENCOUNTER — Other Ambulatory Visit: Payer: Self-pay

## 2020-02-01 DIAGNOSIS — I10 Essential (primary) hypertension: Secondary | ICD-10-CM

## 2020-02-01 DIAGNOSIS — Z789 Other specified health status: Secondary | ICD-10-CM

## 2020-02-04 ENCOUNTER — Encounter: Payer: Self-pay | Admitting: Family Medicine

## 2020-02-04 NOTE — Progress Notes (Signed)
    SUBJECTIVE:   CHIEF COMPLAINT / HPI:   Ingrown toenail Patient presenting for partial nail avulsion for ingrown toenail of right great toe on medial nail fold.   OBJECTIVE:   BP 132/72   Pulse 97   Wt 210 lb 3.2 oz (95.3 kg)   SpO2 98%   BMI 41.05 kg/m   General: Well-appearing female Right foot: Tenderness to palpation of medial nail fold at distal end.  No purulence expressed.  No erythema suggesting underlying infection.  ASSESSMENT/PLAN:   Patient was consented for procedure.  Initial digital block was performed with 4 cc of lidocaine without epi at base of great toe at 2 sites.  After 20 minutes, this did not achieve appropriate anesthesia.  Another 4 cc of lidocaine was injected.  20 minutes after this, patient's still did not have appropriate anesthesia.  Patient will be referred to podiatry for further evaluation and management   Wilber Oliphant, MD [place]

## 2020-02-05 MED ORDER — CETIRIZINE HCL 10 MG PO TABS: 10.0000 mg | ORAL_TABLET | Freq: Every day | ORAL | 11 refills | Status: DC

## 2020-02-05 MED ORDER — CHLORTHALIDONE 25 MG PO TABS: 25.0000 mg | ORAL_TABLET | Freq: Every day | ORAL | 3 refills | Status: DC

## 2020-02-07 ENCOUNTER — Other Ambulatory Visit: Payer: Self-pay

## 2020-02-07 ENCOUNTER — Ambulatory Visit (INDEPENDENT_AMBULATORY_CARE_PROVIDER_SITE_OTHER): Payer: Medicaid Other | Admitting: Family Medicine

## 2020-02-07 ENCOUNTER — Encounter: Payer: Self-pay | Admitting: Family Medicine

## 2020-02-07 VITALS — BP 141/80 | HR 87 | Ht 60.0 in | Wt 209.1 lb

## 2020-02-07 DIAGNOSIS — N950 Postmenopausal bleeding: Secondary | ICD-10-CM

## 2020-02-07 DIAGNOSIS — N898 Other specified noninflammatory disorders of vagina: Secondary | ICD-10-CM

## 2020-02-07 DIAGNOSIS — R310 Gross hematuria: Secondary | ICD-10-CM

## 2020-02-07 HISTORY — DX: Postmenopausal bleeding: N95.0

## 2020-02-07 LAB — POCT URINALYSIS DIP (DEVICE)
Bilirubin Urine: NEGATIVE
Glucose, UA: 500 mg/dL — AB
Ketones, ur: NEGATIVE mg/dL
Nitrite: NEGATIVE
Protein, ur: 100 mg/dL — AB
Specific Gravity, Urine: 1.01 (ref 1.005–1.030)
Urobilinogen, UA: 0.2 mg/dL (ref 0.0–1.0)
pH: 6 (ref 5.0–8.0)

## 2020-02-07 MED ORDER — CLOBETASOL PROPIONATE 0.05 % EX CREA: 1.0000 "application " | TOPICAL_CREAM | Freq: Two times a day (BID) | CUTANEOUS | 0 refills | Status: DC

## 2020-02-07 NOTE — Assessment & Plan Note (Signed)
Minimal, and ? Related to scratching--will check pelvic sono. Has risk factors for endometrial CA-->if concerning, will need endometrial biopsy

## 2020-02-07 NOTE — Assessment & Plan Note (Signed)
No obvious evidence of lichen sclerosis, might be more like lichen planus--will give trial of topical steroid, to be used quite sparingly BID x 3 days, QD x 3 days, then 2x/wk.

## 2020-02-07 NOTE — Progress Notes (Signed)
Subjective:    Patient ID: Alexandra Campos is a 64 y.o. female presenting with Gynecologic Exam  on 02/07/2020  HPI: Vaginal tenderness and itching x 1 month. The tenderness and bleeding is in the last week. Notes some bleeding with wiping, ? Related to vaginal issues vs. Urinary tract. Some brown discharge. Vagina is tender to touch. Vagisil helps a lot. Using Replens, which gave her a discharge. Negative wet prep.Thinks it is related to [place] but that did not help. Menopause x 10 years ago. No sexual activity in 10 years.  Review of Systems  Constitutional: Negative for chills and fever.  Respiratory: Negative for shortness of breath.   Cardiovascular: Negative for chest pain.  Gastrointestinal: Negative for abdominal pain, nausea and vomiting.  Genitourinary: Negative for dysuria.  Skin: Negative for rash.      Objective:    BP (!) 141/80 (BP Location: Right Arm)   Pulse 87   Ht 5' (1.524 m)   Wt 209 lb 1.6 oz (94.8 kg)   BMI 40.84 kg/m  Physical Exam Constitutional:      General: She is not in acute distress.    Appearance: She is well-developed.  HENT:     Head: Normocephalic and atraumatic.  Eyes:     General: No scleral icterus. Cardiovascular:     Rate and Rhythm: Normal rate.  Pulmonary:     Effort: Pulmonary effort is normal.  Chest:     Breasts:        Left: Normal.  Abdominal:     Palpations: Abdomen is soft.  Genitourinary:    Comments: BUS normal, vagina is pale and atrophic. No lesions. Slight loss of architecture at labia minora, no white plaques noted.  Musculoskeletal:     Cervical back: Neck supple.  Skin:    General: Skin is warm and dry.  Neurological:     Mental Status: She is alert and oriented to person, place, and time.    Urinalysis    Component Value Date/Time   COLORURINE Yellow 12/12/2012 0359   APPEARANCEUR Clear 03/23/2019 1950   LABSPEC 1.010 02/07/2020 0837   LABSPEC 1.010 12/12/2012 0359   PHURINE 6.0 02/07/2020 0837    GLUCOSEU 500 (A) 02/07/2020 0837   GLUCOSEU >=500 12/12/2012 0359   HGBUR MODERATE (A) 02/07/2020 0837   BILIRUBINUR NEGATIVE 02/07/2020 0837   BILIRUBINUR negative 11/09/2019 1625   BILIRUBINUR Negative 03/23/2019 1950   BILIRUBINUR Negative 12/12/2012 0359   KETONESUR NEGATIVE 02/07/2020 0837   PROTEINUR 100 (A) 02/07/2020 0837   UROBILINOGEN 0.2 02/07/2020 0837   NITRITE NEGATIVE 02/07/2020 0837   LEUKOCYTESUR SMALL (A) 02/07/2020 0837   LEUKOCYTESUR 3+ 12/12/2012 0359         Assessment & Plan:   Problem List Items Addressed This Visit      Unprioritized   Itching in the vaginal area - Primary    No obvious evidence of lichen sclerosis, might be more like lichen planus--will give trial of topical steroid, to be used quite sparingly BID x 3 days, QD x 3 days, then 2x/wk.      Relevant Medications   clobetasol cream (TEMOVATE) 0.05 %   Postmenopausal bleeding    Minimal, and ? Related to scratching--will check pelvic sono. Has risk factors for endometrial CA-->if concerning, will need endometrial biopsy      Relevant Orders   US PELVIC COMPLETE WITH TRANSVAGINAL    Other Visit Diagnoses    Gross hematuria       Large  blood on UA, will check culture and treat as appropriate.   Relevant Orders   POCT urinalysis dip (device) (Completed)   Urine Culture      Total time: 30 minutes.  Return in about 4 weeks (around 03/06/2020) for a follow-up, needs U/S.  Donnamae Jude 02/07/2020 9:54 AM

## 2020-02-07 NOTE — Patient Instructions (Signed)
Atrophic Vaginitis Atrophic vaginitis is a condition in which the tissues that line the vagina become dry and thin. This condition occurs in women who have stopped having their period. It is caused by a drop in a female hormone (estrogen). This hormone helps:  To keep the vagina moist.  To make a clear fluid. This clear fluid helps: ? To make the vagina ready for sex. ? To protect the vagina from infection. If the lining of the vagina is dry and thin, it may cause irritation, burning, or itchiness. It may also:  Make sex painful.  Make an exam of your vagina painful.  Cause bleeding.  Make you lose interest in sex.  Cause a burning feeling when you pee (urinate).  Cause a brown or yellow fluid to come from your vagina. Some women do not have symptoms. Follow these instructions at home: Medicines  Take over-the-counter and prescription medicines only as told by your doctor.  Do not use herbs or other medicines unless your doctor says it is okay.  Use medicines for for dryness. These include: ? Oils to make the vagina soft. ? Creams. ? Moisturizers. General instructions  Do not douche.  Do not use products that can make your vagina dry. These include: ? Scented sprays. ? Scented tampons. ? Scented soaps.  Sex can help increase blood flow and soften the tissue in the vagina. If it hurts to have sex: ? Tell your partner. ? Use products to make sex more comfortable. Use these only as told by your doctor. Contact a doctor if you:  Have discharge from the vagina that is different than usual.  Have a bad smell coming from your vagina.  Have new symptoms.  Do not get better.  Get worse. Summary  Atrophic vaginitis is a condition in which the lining of the vagina becomes dry and thin.  This condition affects women who have stopped having their periods.  Treatment may include using products that help make the vagina soft.  Call a doctor if do not get better with  treatment. This information is not intended to replace advice given to you by your health care provider. Make sure you discuss any questions you have with your health care provider. Document Revised: 09/13/2017 Document Reviewed: 09/13/2017 Elsevier Patient Education  2020 [employer]  

## 2020-02-09 ENCOUNTER — Telehealth: Payer: Self-pay | Admitting: Family Medicine

## 2020-02-09 LAB — URINE CULTURE

## 2020-02-09 MED ORDER — SULFAMETHOXAZOLE-TRIMETHOPRIM 800-160 MG PO TABS: 1.0000 | ORAL_TABLET | Freq: Two times a day (BID) | ORAL | 0 refills | Status: AC

## 2020-02-09 NOTE — Addendum Note (Signed)
Addended by: Donnamae Jude on: 02/09/2020 02:46 PM   Modules accepted: Orders

## 2020-02-11 NOTE — Telephone Encounter (Signed)
Rx for UTI called in--patient informed

## 2020-02-13 ENCOUNTER — Ambulatory Visit: Payer: Medicaid Other | Admitting: Sports Medicine

## 2020-02-13 ENCOUNTER — Other Ambulatory Visit: Payer: Self-pay

## 2020-02-13 ENCOUNTER — Encounter: Payer: Self-pay | Admitting: Sports Medicine

## 2020-02-13 VITALS — BP 117/81 | HR 96 | Temp 97.1°F | Resp 16

## 2020-02-13 DIAGNOSIS — I129 Hypertensive chronic kidney disease with stage 1 through stage 4 chronic kidney disease, or unspecified chronic kidney disease: Secondary | ICD-10-CM

## 2020-02-13 DIAGNOSIS — I739 Peripheral vascular disease, unspecified: Secondary | ICD-10-CM

## 2020-02-13 DIAGNOSIS — M79675 Pain in left toe(s): Secondary | ICD-10-CM

## 2020-02-13 DIAGNOSIS — L603 Nail dystrophy: Secondary | ICD-10-CM

## 2020-02-13 DIAGNOSIS — E1142 Type 2 diabetes mellitus with diabetic polyneuropathy: Secondary | ICD-10-CM | POA: Diagnosis not present

## 2020-02-13 MED ORDER — NEOMYCIN-POLYMYXIN-HC 3.5-10000-1 OT SOLN
OTIC | 0 refills | Status: DC
Start: 2020-02-13 — End: 2020-06-10

## 2020-02-13 NOTE — Progress Notes (Signed)
Subjective: Alexandra Campos is a 64 y.o. female patient with history of diabetes who presents to office today complaining of intense pain to both big toes right greater than left reports that her PCP tried to numb up her toe but it could not get numb so they did not do a procedure and her PCP also said that her pulses were very low so she is going to vascular schedule on the 16th to evaluate for any blockages in both legs reports that she has been soaking with Epson salt without any improvement in pain at her first toes bilateral right greater than left especially at the medial side admits that the pain is a sharp shooting present for years gradually getting worse.  Patient is diabetic and blood sugar not recorded today and last A1c in December was 7.2.  Patient denies any other pedal complaints at this time.  Review of systems noncontributory.  Patient Active Problem List   Diagnosis Date Noted  . Postmenopausal bleeding 02/07/2020  . Abnormal ankle brachial index (ABI) 01/14/2020  . Abnormal facial hair 11/24/2019  . Itching in the vaginal area 11/10/2019  . History of esophageal reflux 10/03/2019  . Acute right-sided low back pain without sciatica 10/03/2019  . Stress incontinence, female 10/03/2019  . Type 2 DM with CKD stage 3 and hypertension ([place]) 07/03/2019  . Class 3 severe obesity with body mass index (BMI) of 40.0 to 44.9 in adult [facility]) 06/16/2019  . CKD (chronic kidney disease) stage 3, GFR 30-59 ml/min 06/16/2019  . Healthcare maintenance 06/16/2019  . Shortness of breath on exertion 06/16/2019  . Joint pain 06/16/2019  . Carpal tunnel syndrome 06/16/2019  . Abnormal laboratory test result 03/29/2019  . Hypertension 08/04/2018  . Tobacco dependence 08/04/2018   Current Outpatient Medications on File Prior to Visit  Medication Sig Dispense Refill  . albuterol (VENTOLIN HFA) 108 (90 Base) MCG/ACT inhaler Inhale 2 puffs into the lungs every 6 (six) hours as needed for wheezing or  shortness of breath. 6.7 g 1  . amLODipine (NORVASC) 10 MG tablet Take 1 tablet (10 mg total) by mouth at bedtime. 90 tablet 3  . atorvastatin (LIPITOR) 40 MG tablet Take 1 tablet (40 mg total) by mouth daily. 30 tablet 2  . blood glucose meter kit and supplies KIT 1 each by Does not apply route daily. Dispense based on patient and insurance preference. Use up to four times daily as directed. (FOR ICD-9 250.00, 250.01). 1 each 0  . cetirizine (ZYRTEC) 10 MG tablet Take 1 tablet (10 mg total) by mouth daily. 30 tablet 11  . chlorthalidone (HYGROTON) 25 MG tablet Take 1 tablet (25 mg total) by mouth daily. 90 tablet 3  . clobetasol cream (TEMOVATE) 6.78 % Apply 1 application topically 2 (two) times daily. Bid x 3 day, qd x 3 days then 2x/wk 30 g 0  . cyclobenzaprine (FLEXERIL) 10 MG tablet Take 1 tablet (10 mg total) by mouth 3 (three) times daily as needed for muscle spasms. 30 tablet 0  . losartan (COZAAR) 50 MG tablet Take 1 tablet (50 mg total) by mouth daily. 90 tablet 3  . metFORMIN (GLUCOPHAGE) 1000 MG tablet Take 1 tablet (1,000 mg total) by mouth 2 (two) times daily with a meal. TAKE ONE TABLET BY MOUTH 2 TIMES A DAY WITH MEALS 180 tablet 3  . omeprazole (PRILOSEC) 20 MG capsule Take 1 capsule (20 mg total) by mouth daily. 90 capsule 0  . sulfamethoxazole-trimethoprim (BACTRIM DS) 800-160 MG  tablet Take 1 tablet by mouth 2 (two) times daily for 7 days. 14 tablet 0   Current Facility-Administered Medications on File Prior to Visit  Medication Dose Route Frequency Provider Last Rate Last Admin  . carbamide peroxide (DEBROX) 6.5 % OTIC (EAR) solution 5 drop  5 drop Both EARS BID Iloabachie, Chioma E, NP       No Known Allergies  Recent Results (from the past 2160 hour(s))  POCT Wet Prep [facility])     Status: None   Collection Time: 11/24/19 11:09 AM  Result Value Ref Range   Source Wet Prep POC VAG    WBC, Wet Prep HPF POC 0-3    Bacteria Wet Prep HPF POC Few Few   Clue Cells Wet Prep  HPF POC None None   Clue Cells Wet Prep Whiff POC Negative Whiff    Yeast Wet Prep HPF POC None None   KOH Wet Prep POC None None   Trichomonas Wet Prep HPF POC Absent Absent  Basic Metabolic Panel     Status: Abnormal   Collection Time: 01/11/20  5:09 PM  Result Value Ref Range   Glucose 141 (H) 65 - 99 mg/dL   BUN 24 8 - 27 mg/dL   Creatinine, Ser 1.16 (H) 0.57 - 1.00 mg/dL   GFR calc non Af Amer 51 (L) >59 mL/min/1.73   GFR calc Af Amer 58 (L) >59 mL/min/1.73    Comment: **[facility] currently reports eGFR in compliance with the current**   recommendations of the [employer]. [facility] will   update reporting as new guidelines are published from the NKF-ASN   Task force.    BUN/Creatinine Ratio 21 12 - 28   Sodium 140 134 - 144 mmol/L   Potassium 3.7 3.5 - 5.2 mmol/L   Chloride 101 96 - 106 mmol/L   CO2 24 20 - 29 mmol/L   Calcium 10.1 8.7 - 10.3 mg/dL  Lipid Panel     Status: None   Collection Time: 01/11/20  5:09 PM  Result Value Ref Range   Cholesterol, Total 139 100 - 199 mg/dL   Triglycerides 142 0 - 149 mg/dL   HDL 60 >39 mg/dL   VLDL Cholesterol Cal 24 5 - 40 mg/dL   LDL Chol Calc (NIH) 55 0 - 99 mg/dL   Chol/HDL Ratio 2.3 0.0 - 4.4 ratio    Comment:                                   T. Chol/HDL Ratio                                             Men  Women                               1/2 Avg.Risk  3.4    3.3                                   Avg.Risk  5.0    4.4  2X Avg.Risk  9.6    7.1                                3X Avg.Risk 23.4   11.0   POCT urinalysis dip (device)     Status: Abnormal   Collection Time: 02/07/20  8:37 AM  Result Value Ref Range   Glucose, UA 500 (A) NEGATIVE mg/dL   Bilirubin Urine NEGATIVE NEGATIVE   Ketones, ur NEGATIVE NEGATIVE mg/dL   Specific Gravity, Urine 1.010 1.005 - 1.030   Hgb urine dipstick MODERATE (A) NEGATIVE   pH 6.0 5.0 - 8.0   Protein, ur 100 (A) NEGATIVE mg/dL    Urobilinogen, UA 0.2 0.0 - 1.0 mg/dL   Nitrite NEGATIVE NEGATIVE   Leukocytes,Ua SMALL (A) NEGATIVE    Comment: Biochemical Testing Only. Please order routine urinalysis from main lab if confirmatory testing is needed.  Urine Culture     Status: Abnormal   Collection Time: 02/07/20 11:12 AM   Specimen: Urine, Clean Catch   UR  Result Value Ref Range   Urine Culture, Routine Final report (A)    Organism ID, Bacteria Escherichia coli (A)     Comment: Greater than 100,000 colony forming units per mL Cefazolin <=4 ug/mL Cefazolin with an MIC <=16 predicts susceptibility to the oral agents cefaclor, cefdinir, cefpodoxime, cefprozil, cefuroxime, cephalexin, and loracarbef when used for therapy of uncomplicated urinary tract infections due to E. coli, Klebsiella pneumoniae, and Proteus mirabilis.    Antimicrobial Susceptibility Comment     Comment:       ** S = Susceptible; I = Intermediate; R = Resistant **                    P = Positive; N = Negative             MICS are expressed in micrograms per mL    Antibiotic                 RSLT#1    RSLT#2    RSLT#3    RSLT#4 Amoxicillin/Clavulanic Acid    S Ampicillin                     S Cefepime                       S Ceftriaxone                    S Cefuroxime                     S Ciprofloxacin                  S Ertapenem                      S Gentamicin                     S Imipenem                       S Levofloxacin                   S Meropenem                      S Nitrofurantoin  S Piperacillin/Tazobactam        S Tetracycline                   S Tobramycin                     S Trimethoprim/Sulfa             S     Objective: General: Patient is awake, alert, and oriented x 3 and in no acute distress.  Integument: Skin is cool, dry and supple bilateral.  Bilateral hallux nails are fragmented with mild incurvation bilateral borders.  No redness, no warmth, no swelling, no drainage.  No signs of  infection. No open lesions or preulcerative lesions present bilateral. Remaining integument unremarkable.  Vasculature:  Dorsalis Pedis pulse 0/4 bilateral. Posterior Tibial pulse 0/4 bilateral. Capillary fill time <3 sec 1-5 bilateral.  No hair growth to the level of the digits.Temperature gradient within normal limits. No varicosities present bilateral. No edema present bilateral.   Neurology: Gross sensation present via light touch bilateral.  Patient is hypersensitive especially at toes bilateral hallux.  Musculoskeletal: No symptomatic pedal deformities noted bilateral. Muscular strength 5/5 in all lower extremity muscular groups bilateral. Assessment and Plan: Problem List Items Addressed This Visit      Cardiovascular and Mediastinum   Type 2 DM with CKD stage 3 and hypertension ([place])    Other Visit Diagnoses    Toe pain, bilateral    -  Primary   Nail dystrophy       PAD (peripheral artery disease) (HCC)       Diabetic polyneuropathy associated with type 2 diabetes mellitus ([place])          -Examined patient. -Discussed and educated patient on diabetic foot care, especially with  regards to the vascular, neurological and musculoskeletal systems.  -Stressed the importance of good glycemic control and the detriment of not  controlling glucose levels in relation to the foot. -Mechanically debrided all hallux nails bilateral using sterile nail nipper and filed with dremel without incident  -Advised patient due to her poor circulation do not recommend an ingrown procedure at this time patient should keep her vascular appointment that she has on 6/16 for follow-up and advised patient that her pain could also be related to vascular concerns as well meanwhile dispensed Corticosporin solution for patient to use after bath or shower at first toes as needed -Answered all patient questions -Patient to return as scheduled for follow-up evaluation or sooner if problems or issues arise. -Patient  advised to call the office if any problems or questions arise in the meantime.  Landis Martins, DPM

## 2020-02-26 ENCOUNTER — Other Ambulatory Visit: Payer: Self-pay | Admitting: *Deleted

## 2020-02-26 DIAGNOSIS — R6889 Other general symptoms and signs: Secondary | ICD-10-CM

## 2020-02-28 ENCOUNTER — Other Ambulatory Visit: Payer: Self-pay

## 2020-02-28 ENCOUNTER — Ambulatory Visit
Admission: RE | Admit: 2020-02-28 | Discharge: 2020-02-28 | Disposition: A | Discharge status: Home / Self Care | Payer: Medicaid Other | Source: Ambulatory Visit | Attending: Vascular Surgery | Admitting: Vascular Surgery

## 2020-02-28 ENCOUNTER — Ambulatory Visit (INDEPENDENT_AMBULATORY_CARE_PROVIDER_SITE_OTHER): Payer: Medicaid Other | Admitting: Vascular Surgery

## 2020-02-28 ENCOUNTER — Encounter: Payer: Self-pay | Admitting: Vascular Surgery

## 2020-02-28 VITALS — BP 121/81 | HR 82 | Temp 97.6°F | Resp 20 | Ht 60.0 in | Wt 208.7 lb

## 2020-02-28 DIAGNOSIS — I70229 Atherosclerosis of native arteries of extremities with rest pain, unspecified extremity: Secondary | ICD-10-CM

## 2020-02-28 DIAGNOSIS — N950 Postmenopausal bleeding: Secondary | ICD-10-CM | POA: Diagnosis not present

## 2020-02-28 DIAGNOSIS — R6889 Other general symptoms and signs: Secondary | ICD-10-CM | POA: Diagnosis not present

## 2020-02-28 NOTE — H&P (View-Only) (Signed)
REASON FOR CONSULT:    Bilateral leg pain.  The consult is requested by Dr. Zettie Cooley.  ASSESSMENT & PLAN:   PERIPHERAL VASCULAR DISEASE WITH REST PAIN BILATERALLY: Based on her exam she has evidence of infrainguinal arterial occlusive disease bilaterally.  Given that she has rest pain this could clearly become a limb threatening problem.  I do not think that all of her symptoms in her legs can be attributed to her peripheral vascular disease given that she has some symptoms in her legs at rest and has significant back pain.  I suspect she has significant degenerative disc disease of her back.  However I think her peripheral vascular disease is advanced enough that we need to proceed with arteriography.  I have reviewed with the patient the indications for arteriography. In addition, I have reviewed the potential complications of arteriography including but not limited to: Bleeding, arterial injury, arterial thrombosis, dye action, renal insufficiency, or other unpredictable medical problems. I have explained to the patient that if we find disease amenable to angioplasty we could potentially address this at the same time. I have discussed the potential complications of angioplasty and stenting, including but not limited to: Bleeding, arterial thrombosis, arterial injury, dissection, or the need for surgical intervention.  Her arteriogram has been scheduled for 03/15/20.  We have also discussed the importance of tobacco cessation.  In addition we discussed the importance of nutrition and exercise.  I have encouraged her to get on a structured walking program.  She is on a statin and should also be on 81 mg of aspirin daily.  [name], MD Office: [phone]   HPI:   Alexandra Campos is a pleasant 64 y.o. female, who was referred with bilateral lower extremity pain.  On my history she describes pain in both legs which occurs with sitting and standing.  She has pain radiating from her back  down to her calves bilaterally.  She also experiences pain in her calves which is brought on by ambulation and relieved with rest.  She has had the symptoms for over a year but the symptoms have been gradually progressing.  She now has developed pain in her feet at night with paresthesias.  The symptoms are relieved by dependency consistent with rest pain.  She denies any history of nonhealing ulcers.  Her risk factors for peripheral vascular disease include diabetes, hypertension, hypercholesterolemia, a family history of premature cardiovascular disease, and tobacco use.  She smokes 1 pack/day and has been smoking for 42 years.  Past Medical History:  Diagnosis Date  . Arthritis   . Diabetes mellitus without complication ([place])   . Hypertension     Family History  Problem Relation Age of Onset  . Diabetes Mother   . Hypertension Mother   . Diabetes Father   . Hypertension Father   . Cancer Father   . Diabetes Brother   . Hypertension Brother   . Diabetes Daughter   . Hypertension Daughter   . Cancer Paternal Uncle   . Diabetes Maternal Grandmother   . Hypertension Maternal Grandmother     SOCIAL HISTORY: Social History   Socioeconomic History  . Marital status: Single    Spouse name: Not on file  . Number of children: 1  . Years of education: Not on file  . Highest education level: Not on file  Occupational History  . Not on file  Tobacco Use  . Smoking status: Current Every Day Smoker    Packs/day: 1.00  Types: Cigarettes  . Smokeless tobacco: Never Used  . Tobacco comment: knows she needs to quit  Vaping Use  . Vaping Use: Never used  Substance and Sexual Activity  . Alcohol use: Yes    Alcohol/week: 5.0 standard drinks    Types: 5 Cans of beer per week    Comment: 5 drinks/week   . Drug use: Not Currently    Types: Marijuana    Comment: for pain  . Sexual activity: Not on file  Other Topics Concern  . Not on file  Social History Narrative   Information  obtained from New patient questionnaire   Employment: No    Highest level of education:  Some high school    At home:  Her and her son    Transportation:  friend   Religious or person beliefs that affect healthcare: no   Exercise regularly: Trying to do exercise more with    Tobacco use: yes, 30 ypack years (underestimate)   Recreational drug use:  no   ETOH use:  no   Sexually active: no    At risk of having STD:  no   Feel safe in relationship:  n/a   For fun:  Listen to music, watch tv, not much since COVID      Social Determinants of Health   Financial Resource Strain:   . Difficulty of Paying Living Expenses:   Food Insecurity: Food Insecurity Present  . Worried About Charity fundraiser in the Last Year: Sometimes true  . Ran Out of Food in the Last Year: Sometimes true  Transportation Needs: No Transportation Needs  . Lack of Transportation (Medical): No  . Lack of Transportation (Non-Medical): No  Physical Activity:   . Days of Exercise per Week:   . Minutes of Exercise per Session:   Stress:   . Feeling of Stress :   Social Connections:   . Frequency of Communication with Friends and Family:   . Frequency of Social Gatherings with Friends and Family:   . Attends Religious Services:   . Active Member of Clubs or Organizations:   . Attends Archivist Meetings:   Marland Kitchen Marital Status:   Intimate Partner Violence:   . Fear of Current or Ex-Partner:   . Emotionally Abused:   Marland Kitchen Physically Abused:   . Sexually Abused:     No Known Allergies  Current Outpatient Medications  Medication Sig Dispense Refill  . albuterol (VENTOLIN HFA) 108 (90 Base) MCG/ACT inhaler Inhale 2 puffs into the lungs every 6 (six) hours as needed for wheezing or shortness of breath. 6.7 g 1  . amLODipine (NORVASC) 10 MG tablet Take 1 tablet (10 mg total) by mouth at bedtime. 90 tablet 3  . atorvastatin (LIPITOR) 40 MG tablet Take 1 tablet (40 mg total) by mouth daily. 30 tablet 2  .  blood glucose meter kit and supplies KIT 1 each by Does not apply route daily. Dispense based on patient and insurance preference. Use up to four times daily as directed. (FOR ICD-9 250.00, 250.01). 1 each 0  . cetirizine (ZYRTEC) 10 MG tablet Take 1 tablet (10 mg total) by mouth daily. 30 tablet 11  . chlorthalidone (HYGROTON) 25 MG tablet Take 1 tablet (25 mg total) by mouth daily. 90 tablet 3  . clobetasol cream (TEMOVATE) 1.32 % Apply 1 application topically 2 (two) times daily. Bid x 3 day, qd x 3 days then 2x/wk 30 g 0  . losartan (COZAAR) 50 MG  tablet Take 1 tablet (50 mg total) by mouth daily. 90 tablet 3  . metFORMIN (GLUCOPHAGE) 1000 MG tablet Take 1 tablet (1,000 mg total) by mouth 2 (two) times daily with a meal. TAKE ONE TABLET BY MOUTH 2 TIMES A DAY WITH MEALS 180 tablet 3  . neomycin-polymyxin-hydrocortisone (CORTISPORIN) OTIC solution Apply 1-2 drops daily to big toes after bath 10 mL 0  . omeprazole (PRILOSEC) 20 MG capsule Take 1 capsule (20 mg total) by mouth daily. 90 capsule 0   Current Facility-Administered Medications  Medication Dose Route Frequency Provider Last Rate Last Admin  . carbamide peroxide (DEBROX) 6.5 % OTIC (EAR) solution 5 drop  5 drop Both EARS BID Iloabachie, Chioma E, NP        REVIEW OF SYSTEMS:  _0  denotes positive finding, _1  denotes negative finding Cardiac  Comments:  Chest pain or chest pressure:    Shortness of breath upon exertion: x   Short of breath when lying flat:    Irregular heart rhythm:        Vascular    Pain in calf, thigh, or hip brought on by ambulation: x   Pain in feet at night that wakes you up from your sleep:  x   Blood clot in your veins:    Leg swelling:  x       Pulmonary    Oxygen at home:    Productive cough:     Wheezing:  x       Neurologic    Sudden weakness in arms or legs:     Sudden numbness in arms or legs:     Sudden onset of difficulty speaking or slurred speech:    Temporary loss of vision in one  eye:     Problems with dizziness:         Gastrointestinal    Blood in stool:     Vomited blood:         Genitourinary    Burning when urinating:     Blood in urine: x       Psychiatric    Major depression:         Hematologic    Bleeding problems:    Problems with blood clotting too easily:        Skin    Rashes or ulcers:        Constitutional    Fever or chills:     PHYSICAL EXAM:   Vitals:   02/28/20 1451  BP: 121/81  Pulse: 82  Resp: 20  Temp: 97.6 F (36.4 C)  TempSrc: Temporal  SpO2: 95%  Weight: 208 lb 11.2 oz (94.7 kg)  Height: 5' (1.524 m)   Body mass index is 40.76 kg/m.  GENERAL: The patient is a well-nourished female, in no acute distress. The vital signs are documented above. CARDIAC: There is a regular rate and rhythm.  VASCULAR: I do not detect carotid bruits. She has palpable femoral pulses. I cannot palpate pedal pulses. She has no ischemic ulcers on her feet. She has no significant lower extremity swelling. PULMONARY: There is good air exchange bilaterally without wheezing or rales. ABDOMEN: Soft and non-tender with normal pitched bowel sounds.  MUSCULOSKELETAL: There are no major deformities or cyanosis. NEUROLOGIC: No focal weakness or paresthesias are detected. SKIN: There are no ulcers or rashes noted. PSYCHIATRIC: The patient has a normal affect.  DATA:    ARTERIAL DOPPLER STUDY: I have independently interpreted her arterial Doppler study today.  On  the right side there is a monophasic dorsalis pedis and posterior tibial signal.  ABI is 58%.  Toe pressures 34 mmHg.  On the left side there is a monophasic dorsalis pedis and posterior tibial signal.  ABI is 65%.  Toe pressure 60 mmHg.  LABS: I have reviewed her labs from 01/11/2020.  GFR was 58.  Creatinine was 1.16.

## 2020-02-28 NOTE — Progress Notes (Signed)
 REASON FOR CONSULT:    Bilateral leg pain.  The consult is requested by Dr. Rachel Kim.  ASSESSMENT & PLAN:   PERIPHERAL VASCULAR DISEASE WITH REST PAIN BILATERALLY: Based on her exam she has evidence of infrainguinal arterial occlusive disease bilaterally.  Given that she has rest pain this could clearly become a limb threatening problem.  I do not think that all of her symptoms in her legs can be attributed to her peripheral vascular disease given that she has some symptoms in her legs at rest and has significant back pain.  I suspect she has significant degenerative disc disease of her back.  However I think her peripheral vascular disease is advanced enough that we need to proceed with arteriography.  I have reviewed with the patient the indications for arteriography. In addition, I have reviewed the potential complications of arteriography including but not limited to: Bleeding, arterial injury, arterial thrombosis, dye action, renal insufficiency, or other unpredictable medical problems. I have explained to the patient that if we find disease amenable to angioplasty we could potentially address this at the same time. I have discussed the potential complications of angioplasty and stenting, including but not limited to: Bleeding, arterial thrombosis, arterial injury, dissection, or the need for surgical intervention.  Her arteriogram has been scheduled for 03/15/20.  We have also discussed the importance of tobacco cessation.  In addition we discussed the importance of nutrition and exercise.  I have encouraged her to get on a structured walking program.  She is on a statin and should also be on 81 mg of aspirin daily.  Ranen Doolin, MD Office: [phone]   HPI:   Alexandra Campos is a pleasant 64 y.o. female, who was referred with bilateral lower extremity pain.  On my history she describes pain in both legs which occurs with sitting and standing.  She has pain radiating from her back  down to her calves bilaterally.  She also experiences pain in her calves which is brought on by ambulation and relieved with rest.  She has had the symptoms for over a year but the symptoms have been gradually progressing.  She now has developed pain in her feet at night with paresthesias.  The symptoms are relieved by dependency consistent with rest pain.  She denies any history of nonhealing ulcers.  Her risk factors for peripheral vascular disease include diabetes, hypertension, hypercholesterolemia, a family history of premature cardiovascular disease, and tobacco use.  She smokes 1 pack/day and has been smoking for 42 years.  Past Medical History:  Diagnosis Date  . Arthritis   . Diabetes mellitus without complication (HCC)   . Hypertension     Family History  Problem Relation Age of Onset  . Diabetes Mother   . Hypertension Mother   . Diabetes Father   . Hypertension Father   . Cancer Father   . Diabetes Brother   . Hypertension Brother   . Diabetes Daughter   . Hypertension Daughter   . Cancer Paternal Uncle   . Diabetes Maternal Grandmother   . Hypertension Maternal Grandmother     SOCIAL HISTORY: Social History   Socioeconomic History  . Marital status: Single    Spouse name: Not on file  . Number of children: 1  . Years of education: Not on file  . Highest education level: Not on file  Occupational History  . Not on file  Tobacco Use  . Smoking status: Current Every Day Smoker    Packs/day: 1.00      Types: Cigarettes  . Smokeless tobacco: Never Used  . Tobacco comment: knows she needs to quit  Vaping Use  . Vaping Use: Never used  Substance and Sexual Activity  . Alcohol use: Yes    Alcohol/week: 5.0 standard drinks    Types: 5 Cans of beer per week    Comment: 5 drinks/week   . Drug use: Not Currently    Types: Marijuana    Comment: for pain  . Sexual activity: Not on file  Other Topics Concern  . Not on file  Social History Narrative   Information  obtained from New patient questionnaire   Employment: No    Highest level of education:  Some high school    At home:  Her and her son    Transportation:  friend   Religious or person beliefs that affect healthcare: no   Exercise regularly: Trying to do exercise more with    Tobacco use: yes, 40 ypack years (underestimate)   Recreational drug use:  no   ETOH use:  no   Sexually active: no    At risk of having STD:  no   Feel safe in relationship:  n/a   For fun:  Listen to music, watch tv, not much since COVID      Social Determinants of Health   Financial Resource Strain:   . Difficulty of Paying Living Expenses:   Food Insecurity: Food Insecurity Present  . Worried About Running Out of Food in the Last Year: Sometimes true  . Ran Out of Food in the Last Year: Sometimes true  Transportation Needs: No Transportation Needs  . Lack of Transportation (Medical): No  . Lack of Transportation (Non-Medical): No  Physical Activity:   . Days of Exercise per Week:   . Minutes of Exercise per Session:   Stress:   . Feeling of Stress :   Social Connections:   . Frequency of Communication with Friends and Family:   . Frequency of Social Gatherings with Friends and Family:   . Attends Religious Services:   . Active Member of Clubs or Organizations:   . Attends Club or Organization Meetings:   . Marital Status:   Intimate Partner Violence:   . Fear of Current or Ex-Partner:   . Emotionally Abused:   . Physically Abused:   . Sexually Abused:     No Known Allergies  Current Outpatient Medications  Medication Sig Dispense Refill  . albuterol (VENTOLIN HFA) 108 (90 Base) MCG/ACT inhaler Inhale 2 puffs into the lungs every 6 (six) hours as needed for wheezing or shortness of breath. 6.7 g 1  . amLODipine (NORVASC) 10 MG tablet Take 1 tablet (10 mg total) by mouth at bedtime. 90 tablet 3  . atorvastatin (LIPITOR) 40 MG tablet Take 1 tablet (40 mg total) by mouth daily. 30 tablet 2  .  blood glucose meter kit and supplies KIT 1 each by Does not apply route daily. Dispense based on patient and insurance preference. Use up to four times daily as directed. (FOR ICD-9 250.00, 250.01). 1 each 0  . cetirizine (ZYRTEC) 10 MG tablet Take 1 tablet (10 mg total) by mouth daily. 30 tablet 11  . chlorthalidone (HYGROTON) 25 MG tablet Take 1 tablet (25 mg total) by mouth daily. 90 tablet 3  . clobetasol cream (TEMOVATE) 0.05 % Apply 1 application topically 2 (two) times daily. Bid x 3 day, qd x 3 days then 2x/wk 30 g 0  . losartan (COZAAR) 50 MG   tablet Take 1 tablet (50 mg total) by mouth daily. 90 tablet 3  . metFORMIN (GLUCOPHAGE) 1000 MG tablet Take 1 tablet (1,000 mg total) by mouth 2 (two) times daily with a meal. TAKE ONE TABLET BY MOUTH 2 TIMES A DAY WITH MEALS 180 tablet 3  . neomycin-polymyxin-hydrocortisone (CORTISPORIN) OTIC solution Apply 1-2 drops daily to big toes after bath 10 mL 0  . omeprazole (PRILOSEC) 20 MG capsule Take 1 capsule (20 mg total) by mouth daily. 90 capsule 0   Current Facility-Administered Medications  Medication Dose Route Frequency Provider Last Rate Last Admin  . carbamide peroxide (DEBROX) 6.5 % OTIC (EAR) solution 5 drop  5 drop Both EARS BID Iloabachie, Chioma E, NP        REVIEW OF SYSTEMS:  [X] denotes positive finding, [ ] denotes negative finding Cardiac  Comments:  Chest pain or chest pressure:    Shortness of breath upon exertion: x   Short of breath when lying flat:    Irregular heart rhythm:        Vascular    Pain in calf, thigh, or hip brought on by ambulation: x   Pain in feet at night that wakes you up from your sleep:  x   Blood clot in your veins:    Leg swelling:  x       Pulmonary    Oxygen at home:    Productive cough:     Wheezing:  x       Neurologic    Sudden weakness in arms or legs:     Sudden numbness in arms or legs:     Sudden onset of difficulty speaking or slurred speech:    Temporary loss of vision in one  eye:     Problems with dizziness:         Gastrointestinal    Blood in stool:     Vomited blood:         Genitourinary    Burning when urinating:     Blood in urine: x       Psychiatric    Major depression:         Hematologic    Bleeding problems:    Problems with blood clotting too easily:        Skin    Rashes or ulcers:        Constitutional    Fever or chills:     PHYSICAL EXAM:   Vitals:   02/28/20 1451  BP: 121/81  Pulse: 82  Resp: 20  Temp: 97.6 F (36.4 C)  TempSrc: Temporal  SpO2: 95%  Weight: 208 lb 11.2 oz (94.7 kg)  Height: 5' (1.524 m)   Body mass index is 40.76 kg/m.  GENERAL: The patient is a well-nourished female, in no acute distress. The vital signs are documented above. CARDIAC: There is a regular rate and rhythm.  VASCULAR: I do not detect carotid bruits. She has palpable femoral pulses. I cannot palpate pedal pulses. She has no ischemic ulcers on her feet. She has no significant lower extremity swelling. PULMONARY: There is good air exchange bilaterally without wheezing or rales. ABDOMEN: Soft and non-tender with normal pitched bowel sounds.  MUSCULOSKELETAL: There are no major deformities or cyanosis. NEUROLOGIC: No focal weakness or paresthesias are detected. SKIN: There are no ulcers or rashes noted. PSYCHIATRIC: The patient has a normal affect.  DATA:    ARTERIAL DOPPLER STUDY: I have independently interpreted her arterial Doppler study today.  On   the right side there is a monophasic dorsalis pedis and posterior tibial signal.  ABI is 58%.  Toe pressures 34 mmHg.  On the left side there is a monophasic dorsalis pedis and posterior tibial signal.  ABI is 65%.  Toe pressure 60 mmHg.  LABS: I have reviewed her labs from 01/11/2020.  GFR was 58.  Creatinine was 1.16.  

## 2020-02-29 ENCOUNTER — Ambulatory Visit
Admission: RE | Admit: 2020-02-29 | Discharge: 2020-02-29 | Disposition: A | Discharge status: Home / Self Care | Payer: Medicaid Other | Source: Ambulatory Visit | Attending: Family Medicine | Admitting: Family Medicine

## 2020-02-29 DIAGNOSIS — N950 Postmenopausal bleeding: Secondary | ICD-10-CM | POA: Diagnosis not present

## 2020-03-08 ENCOUNTER — Other Ambulatory Visit (HOSPITAL_COMMUNITY)
Admission: RE | Admit: 2020-03-08 | Discharge: 2020-03-08 | Disposition: A | Discharge status: Home / Self Care | Payer: Medicaid Other | Source: Ambulatory Visit | Attending: Obstetrics & Gynecology | Admitting: Obstetrics & Gynecology

## 2020-03-08 ENCOUNTER — Other Ambulatory Visit: Payer: Self-pay

## 2020-03-08 ENCOUNTER — Ambulatory Visit (INDEPENDENT_AMBULATORY_CARE_PROVIDER_SITE_OTHER): Payer: Medicaid Other

## 2020-03-08 ENCOUNTER — Encounter: Payer: Self-pay | Admitting: Obstetrics & Gynecology

## 2020-03-08 VITALS — BP 144/98 | HR 77 | Wt 208.0 lb

## 2020-03-08 DIAGNOSIS — N939 Abnormal uterine and vaginal bleeding, unspecified: Secondary | ICD-10-CM

## 2020-03-08 DIAGNOSIS — N950 Postmenopausal bleeding: Secondary | ICD-10-CM | POA: Diagnosis not present

## 2020-03-08 DIAGNOSIS — N898 Other specified noninflammatory disorders of vagina: Secondary | ICD-10-CM

## 2020-03-08 DIAGNOSIS — Z719 Counseling, unspecified: Secondary | ICD-10-CM

## 2020-03-08 NOTE — Progress Notes (Signed)
Patient was assessed and managed by nursing staff during this encounter. I have reviewed the chart and agree with the documentation and plan. I have also made any necessary editorial changes.  Verita Schneiders, MD 03/08/2020 12:12 PM

## 2020-03-08 NOTE — Progress Notes (Signed)
Pt roomed for endometrial biopsy visit. Pt states she was not aware of having this procedure today and would not like to have this done today. Discussed importance of this procedure and the procedure steps. Pt agrees to reschedule and verbalizes understanding that this is necessary for follow up to abnormal bleeding. Pt agreeable to have CA 125 lab drawn today.   Pt reports continued vaginal itching. Self-swab instructions given and specimen obtained. Pt has been using steroid vaginal cream since visit on 02/07/20. Per Anyanwu, MD pt to begin taking 3x/week while having vaginal symptoms. I explained to pt we will call with abnormal results if her swab shows any infection.   Appt for endometrial biopsy scheduled for 04/08/20 at [facility] with Kennon Rounds, MD.  Apolonio Schneiders RN 03/08/20

## 2020-03-09 ENCOUNTER — Encounter: Payer: Self-pay | Admitting: Obstetrics & Gynecology

## 2020-03-09 DIAGNOSIS — N83209 Unspecified ovarian cyst, unspecified side: Secondary | ICD-10-CM

## 2020-03-09 HISTORY — DX: Unspecified ovarian cyst, unspecified side: N83.209

## 2020-03-09 LAB — CA 125: Cancer Antigen (CA) 125: 17.3 U/mL (ref 0.0–38.1)

## 2020-03-11 LAB — CERVICOVAGINAL ANCILLARY ONLY
Bacterial Vaginitis (gardnerella): NEGATIVE
Candida Glabrata: NEGATIVE
Candida Vaginitis: POSITIVE — AB
Chlamydia: NEGATIVE
Comment: NEGATIVE
Comment: NEGATIVE
Comment: NEGATIVE
Comment: NEGATIVE
Comment: NEGATIVE
Comment: NORMAL
Neisseria Gonorrhea: NEGATIVE
Trichomonas: NEGATIVE

## 2020-03-12 ENCOUNTER — Ambulatory Visit: Payer: Medicaid Other | Admitting: Sports Medicine

## 2020-03-13 ENCOUNTER — Telehealth: Payer: Self-pay

## 2020-03-13 MED ORDER — ACCU-CHEK GUIDE VI STRP: ORAL_STRIP | 12 refills | Status: DC

## 2020-03-15 ENCOUNTER — Other Ambulatory Visit: Payer: Self-pay

## 2020-03-15 ENCOUNTER — Encounter (HOSPITAL_COMMUNITY): Payer: Self-pay | Admitting: Vascular Surgery

## 2020-03-15 ENCOUNTER — Encounter: Payer: Self-pay | Admitting: Vascular Surgery

## 2020-03-15 ENCOUNTER — Ambulatory Visit (HOSPITAL_COMMUNITY)
Admission: RE | Admit: 2020-03-15 | Discharge: 2020-03-15 | Disposition: A | Discharge status: Home / Self Care | Payer: Medicaid Other | Attending: Vascular Surgery | Admitting: Vascular Surgery

## 2020-03-15 ENCOUNTER — Encounter (HOSPITAL_COMMUNITY)
Admission: RE | Disposition: A | Discharge status: Home / Self Care | Payer: Self-pay | Source: Home / Self Care | Attending: Vascular Surgery

## 2020-03-15 DIAGNOSIS — M79604 Pain in right leg: Secondary | ICD-10-CM | POA: Diagnosis not present

## 2020-03-15 DIAGNOSIS — I10 Essential (primary) hypertension: Secondary | ICD-10-CM | POA: Diagnosis not present

## 2020-03-15 DIAGNOSIS — I70223 Atherosclerosis of native arteries of extremities with rest pain, bilateral legs: Secondary | ICD-10-CM | POA: Diagnosis not present

## 2020-03-15 DIAGNOSIS — Z833 Family history of diabetes mellitus: Secondary | ICD-10-CM | POA: Diagnosis not present

## 2020-03-15 DIAGNOSIS — F17210 Nicotine dependence, cigarettes, uncomplicated: Secondary | ICD-10-CM | POA: Diagnosis not present

## 2020-03-15 DIAGNOSIS — M1990 Unspecified osteoarthritis, unspecified site: Secondary | ICD-10-CM | POA: Diagnosis not present

## 2020-03-15 DIAGNOSIS — M79605 Pain in left leg: Secondary | ICD-10-CM | POA: Diagnosis not present

## 2020-03-15 DIAGNOSIS — Z79899 Other long term (current) drug therapy: Secondary | ICD-10-CM | POA: Insufficient documentation

## 2020-03-15 DIAGNOSIS — Z7984 Long term (current) use of oral hypoglycemic drugs: Secondary | ICD-10-CM | POA: Diagnosis not present

## 2020-03-15 DIAGNOSIS — Z8249 Family history of ischemic heart disease and other diseases of the circulatory system: Secondary | ICD-10-CM | POA: Diagnosis not present

## 2020-03-15 DIAGNOSIS — E1151 Type 2 diabetes mellitus with diabetic peripheral angiopathy without gangrene: Secondary | ICD-10-CM | POA: Diagnosis present

## 2020-03-15 DIAGNOSIS — I70229 Atherosclerosis of native arteries of extremities with rest pain, unspecified extremity: Secondary | ICD-10-CM

## 2020-03-15 DIAGNOSIS — Z7982 Long term (current) use of aspirin: Secondary | ICD-10-CM | POA: Insufficient documentation

## 2020-03-15 HISTORY — PX: ABDOMINAL AORTOGRAM W/LOWER EXTREMITY: CATH118223

## 2020-03-15 LAB — POCT I-STAT, CHEM 8
BUN: 21 mg/dL (ref 8–23)
Calcium, Ion: 1.2 mmol/L (ref 1.15–1.40)
Chloride: 100 mmol/L (ref 98–111)
Creatinine, Ser: 1.1 mg/dL — ABNORMAL HIGH (ref 0.44–1.00)
Glucose, Bld: 158 mg/dL — ABNORMAL HIGH (ref 70–99)
HCT: 37 % (ref 36.0–46.0)
Hemoglobin: 12.6 g/dL (ref 12.0–15.0)
Potassium: 3.1 mmol/L — ABNORMAL LOW (ref 3.5–5.1)
Sodium: 139 mmol/L (ref 135–145)
TCO2: 27 mmol/L (ref 22–32)

## 2020-03-15 LAB — GLUCOSE, CAPILLARY: Glucose-Capillary: 146 mg/dL — ABNORMAL HIGH (ref 70–99)

## 2020-03-15 SURGERY — ABDOMINAL AORTOGRAM W/LOWER EXTREMITY
Anesthesia: LOCAL

## 2020-03-15 MED ORDER — FENTANYL CITRATE (PF) 100 MCG/2ML IJ SOLN
INTRAMUSCULAR | Status: DC | PRN
  Administered 2020-03-15: 50 ug via INTRAVENOUS

## 2020-03-15 MED ORDER — MIDAZOLAM HCL 2 MG/2ML IJ SOLN
INTRAMUSCULAR | Status: DC | PRN
  Administered 2020-03-15 (×2): 1 mg via INTRAVENOUS

## 2020-03-15 MED ORDER — SODIUM CHLORIDE 0.9 % IV SOLN: INTRAVENOUS | Status: DC

## 2020-03-15 MED ORDER — FENTANYL CITRATE (PF) 100 MCG/2ML IJ SOLN
INTRAMUSCULAR | Status: AC
  Filled 2020-03-15: qty 2

## 2020-03-15 MED ORDER — ONDANSETRON HCL 4 MG/2ML IJ SOLN: 4.0000 mg | Freq: Four times a day (QID) | INTRAMUSCULAR | Status: DC | PRN

## 2020-03-15 MED ORDER — HEPARIN (PORCINE) IN NACL 1000-0.9 UT/500ML-% IV SOLN
INTRAVENOUS | Status: DC | PRN
  Administered 2020-03-15 (×2): 500 mL

## 2020-03-15 MED ORDER — SODIUM CHLORIDE 0.9% FLUSH: 3.0000 mL | Freq: Two times a day (BID) | INTRAVENOUS | Status: DC

## 2020-03-15 MED ORDER — POTASSIUM CHLORIDE CRYS ER 20 MEQ PO TBCR
EXTENDED_RELEASE_TABLET | ORAL | Status: AC
  Filled 2020-03-15: qty 2

## 2020-03-15 MED ORDER — LIDOCAINE HCL (PF) 1 % IJ SOLN
INTRAMUSCULAR | Status: DC | PRN
  Administered 2020-03-15: 20 mL via SUBCUTANEOUS

## 2020-03-15 MED ORDER — SODIUM CHLORIDE 0.9 % IV SOLN: 250.0000 mL | INTRAVENOUS | Status: DC | PRN

## 2020-03-15 MED ORDER — POTASSIUM CHLORIDE CRYS ER 20 MEQ PO TBCR
40.0000 meq | EXTENDED_RELEASE_TABLET | Freq: Once | ORAL | Status: AC
  Administered 2020-03-15: 40 meq via ORAL

## 2020-03-15 MED ORDER — SODIUM CHLORIDE 0.9 % WEIGHT BASED INFUSION: 1.0000 mL/kg/h | INTRAVENOUS | Status: DC

## 2020-03-15 MED ORDER — LIDOCAINE HCL (PF) 1 % IJ SOLN
INTRAMUSCULAR | Status: AC
  Filled 2020-03-15: qty 30

## 2020-03-15 MED ORDER — SODIUM CHLORIDE 0.9% FLUSH: 3.0000 mL | INTRAVENOUS | Status: DC | PRN

## 2020-03-15 MED ORDER — LABETALOL HCL 5 MG/ML IV SOLN: 10.0000 mg | INTRAVENOUS | Status: DC | PRN

## 2020-03-15 MED ORDER — HEPARIN (PORCINE) IN NACL 1000-0.9 UT/500ML-% IV SOLN
INTRAVENOUS | Status: AC
  Filled 2020-03-15: qty 1000

## 2020-03-15 MED ORDER — ACETAMINOPHEN 325 MG PO TABS: 650.0000 mg | ORAL_TABLET | ORAL | Status: DC | PRN

## 2020-03-15 MED ORDER — IODIXANOL 320 MG/ML IV SOLN
INTRAVENOUS | Status: DC | PRN
  Administered 2020-03-15: 152 mL via INTRA_ARTERIAL

## 2020-03-15 MED ORDER — MIDAZOLAM HCL 2 MG/2ML IJ SOLN
INTRAMUSCULAR | Status: AC
  Filled 2020-03-15: qty 2

## 2020-03-15 MED ORDER — HYDRALAZINE HCL 20 MG/ML IJ SOLN: 5.0000 mg | INTRAMUSCULAR | Status: DC | PRN

## 2020-03-15 SURGICAL SUPPLY — 12 items
CATH ANGIO 5F PIGTAIL 65CM (CATHETERS) ×2 IMPLANT
FILTER CO2 0.2 MICRON (VASCULAR PRODUCTS) IMPLANT
KIT MICROPUNCTURE NIT STIFF (SHEATH) ×2 IMPLANT
KIT PV (KITS) ×2 IMPLANT
RESERVOIR CO2 (VASCULAR PRODUCTS) IMPLANT
SET FLUSH CO2 (MISCELLANEOUS) IMPLANT
SHEATH PINNACLE 5F 10CM (SHEATH) ×2 IMPLANT
SHEATH PROBE COVER 6X72 (BAG) ×2 IMPLANT
SYR MEDRAD MARK V 150ML (SYRINGE) ×2 IMPLANT
TRANSDUCER W/STOPCOCK (MISCELLANEOUS) ×2 IMPLANT
TRAY PV CATH (CUSTOM PROCEDURE TRAY) ×2 IMPLANT
WIRE STARTER BENTSON 035X150 (WIRE) ×2 IMPLANT

## 2020-03-15 NOTE — Discharge Instructions (Signed)
Femoral Site Care This sheet gives you information about how to care for yourself after your procedure. Your health care provider may also give you more specific instructions. If you have problems or questions, contact your health care provider. What can I expect after the procedure? After the procedure, it is common to have:  Bruising that usually fades within 1-2 weeks.  Tenderness at the site. Follow these instructions at home: Wound care  Follow instructions from your health care provider about how to take care of your insertion site. Make sure you: ? Wash your hands with soap and water before you change your bandage (dressing). If soap and water are not available, use hand sanitizer. ? Change your dressing as told by your health care provider. ? Leave stitches (sutures), skin glue, or adhesive strips in place. These skin closures may need to stay in place for 2 weeks or longer. If adhesive strip edges start to loosen and curl up, you may trim the loose edges. Do not remove adhesive strips completely unless your health care provider tells you to do that.  Do not take baths, swim, or use a hot tub until your health care provider approves.  You may shower 24-48 hours after the procedure or as told by your health care provider. ? Gently wash the site with plain soap and water. ? Pat the area dry with a clean towel. ? Do not rub the site. This may cause bleeding.  Do not apply powder or lotion to the site. Keep the site clean and dry.  Check your femoral site every day for signs of infection. Check for: ? Redness, swelling, or pain. ? Fluid or blood. ? Warmth. ? Pus or a bad smell. Activity  For the first 2-3 days after your procedure, or as long as directed: ? Avoid climbing stairs as much as possible. ? Do not squat.  Do not lift anything that is heavier than 10 lb (4.5 kg), or the limit that you are told, until your health care provider says that it is safe.  Rest as  directed. ? Avoid sitting for a long time without moving. Get up to take short walks every 1-2 hours.  Do not drive for 24 hours if you were given a medicine to help you relax (sedative). General instructions  Take over-the-counter and prescription medicines only as told by your health care provider.  Keep all follow-up visits as told by your health care provider. This is important. Contact a health care provider if you have:  A fever or chills.  You have redness, swelling, or pain around your insertion site. Get help right away if:  The catheter insertion area swells very fast.  You pass out.  You suddenly start to sweat or your skin gets clammy.  The catheter insertion area is bleeding, and the bleeding does not stop when you hold steady pressure on the area.  The area near or just beyond the catheter insertion site becomes pale, cool, tingly, or numb. These symptoms may represent a serious problem that is an emergency. Do not wait to see if the symptoms will go away. Get medical help right away. Call your local emergency services (911 in the [place] Do not drive yourself to the hospital. Summary  After the procedure, it is common to have bruising that usually fades within 1-2 weeks.  Check your femoral site every day for signs of infection.  Do not lift anything that is heavier than 10 lb (4.5 kg), or the   limit that you are told, until your health care provider says that it is safe. This information is not intended to replace advice given to you by your health care provider. Make sure you discuss any questions you have with your health care provider. Document Revised: 09/13/2017 Document Reviewed: 09/13/2017 Elsevier Patient Education  2020 Elsevier Inc.  

## 2020-03-15 NOTE — Progress Notes (Signed)
Site area: rt groin arterial site Site Prior to Removal:  Level 0 Pressure Applied For: 20 minutes Manual:   yes Patient Status During Pull:  stable Post Pull Site:  Level 0 Post Pull Instructions Given:  yes Post Pull Pulses Present: rt pt dopplered Dressing Applied:  Gauze and tegaderm Bedrest begins @ 0920 Comments:

## 2020-03-15 NOTE — Op Note (Signed)
PATIENT: Alexandra Campos      MRN: [id number] DOB: Mar 07, 1956    DATE OF PROCEDURE: 03/15/2020  INDICATIONS:    Alexandra Campos is a 64 y.o. female who I saw in the office on 02/28/2020 with bilateral lower extremity pain.  She does have back issues that certainly may be contributing but also had evidence of infrainguinal arterial occlusive disease.  She comes in for arteriography.  We discussed the importance of tobacco cessation at that visit.  We also discussed nutrition and exercise.  She was on a statin and was also taking 81 mg of aspirin daily.  PROCEDURE:    1.  Conscious sedation 2.  Ultrasound-guided access to the right common femoral artery 3.  Aortogram with bilateral iliac arteriogram and bilateral lower extremity runoff  SURGEON: Judeth Cornfield. Scot Dock, MD, FACS  ANESTHESIA: Local with sedation  EBL: Minimal  TECHNIQUE: The patient was brought to the peripheral vascular lab and was sedated. The period of conscious sedation was 49 minutes.  During that time period, I was present face-to-face 100% of the time.  The patient was administered 1 mg of Versed and 50 mcg of fentanyl.  She subsequently received an additional milligram of Versed and 50 mcg of fentanyl.. The patient's heart rate, blood pressure, and oxygen saturation were monitored by the nurse continuously during the procedure.  Both groins were prepped and draped in the usual sterile fashion.  Under ultrasound guidance, after the skin was anesthetized, I cannulated the right common femoral artery with a micropuncture needle and a micropuncture sheath was introduced over a wire.  This was exchanged for a 5 [place] sheath over a Bentson wire.  By ultrasound the femoral artery was patent.  There was extensive plaque in both common femoral arteries posteriorly.  There was only a small lumen anteriorly bilaterally.  A real-time image was obtained and sent to the server.  Pigtail catheter was positioned at the L1 vertebral body and  flush aortogram obtained.  The cath was in position above the aortic bifurcation and an oblique iliac projection was obtained.  Next bilateral lower extremity runoff films were obtained.  I then remove the pigtail catheter over a wire and shot a right femoral sheath shot at a RAO projection.  The patient was then transferred to the holding area for removal of the sheath.  No immediate complications were noted.  FINDINGS:   RENAL/AORTA: There are single renal arteries bilaterally with no significant renal artery stenosis identified.  The infrarenal aorta is widely patent without significant plaque.  RIGHT INFLOW: The common iliac external iliac and hypogastric arteries are patent on the right without significant stenosis.  RIGHT LOWER EXTREMITY RUNOFF: On the right side there is extensive plaque in the common femoral artery.  The deep femoral artery is patent.  The superficial femoral artery is occluded at its origin.  There is reconstitution of the popliteal artery at the level of the knee.  The below-knee popliteal artery is patent with three-vessel runoff on the right via the anterior tibial, posterior tibial, and peroneal arteries.  The tibials are small with moderate diffuse disease.  LEFT INFLOW: There is no significant disease in the left common iliac, external iliac, or hypogastric artery.  LEFT LOWER EXTREMITY RUNOFF: The common femoral artery has extensive plaque posteriorly.  The deep femoral artery is patent.  The superficial femoral artery is occluded at its origin.  There is reconstitution of the popliteal artery at the level of the knee.  The  below-knee popliteal artery is patent.  There is disease in the proximal anterior tibial artery.  The artery reconstitutes below that.  The anterior tibial, peroneal, and posterior tibial arteries are patent although they are small with moderate diffuse disease.  CLINICAL NOTE: Given her obesity and comorbidities I would favor a nonoperative approach  if possible.  I do not think she is a candidate for an endovascular approach.  We had a long discussion about the importance of tobacco cessation.  We have also discussed a structured walking program and nutrition.  I will plan on seeing her back in 3 to 4 weeks.  At that time we will obtain a vein map of both great saphenous veins in case her symptoms progress in which case she would require staged common femoral artery endarterectomies and a fem below-knee pop bypass.  [name], MD, FACS [facility]  DATE OF DICTATION:   03/15/2020

## 2020-03-15 NOTE — Progress Notes (Signed)
Discharge instructions reviewed with pt and her sister (via telephone) both voice understanding. 

## 2020-03-15 NOTE — Progress Notes (Signed)
Ambulated in hallway and to the bathroom to void. No bleeding noted before or after ambulation. Tol well

## 2020-03-15 NOTE — Interval H&P Note (Signed)
History and Physical Interval Note:  03/15/2020 7:14 AM  Alexandra Campos  has presented today for surgery, with the diagnosis of PVD.  The various methods of treatment have been discussed with the patient and family. After consideration of risks, benefits and other options for treatment, the patient has consented to  Procedure(s): ABDOMINAL AORTOGRAM W/LOWER EXTREMITY (N/A) as a surgical intervention.  The patient's history has been reviewed, patient examined, no change in status, stable for surgery.  I have reviewed the patient's chart and labs.  Questions were answered to the patient's satisfaction.     [name]

## 2020-03-20 ENCOUNTER — Telehealth (INDEPENDENT_AMBULATORY_CARE_PROVIDER_SITE_OTHER): Payer: Medicaid Other | Admitting: Lactation Services

## 2020-03-20 DIAGNOSIS — B373 Candidiasis of vulva and vagina: Secondary | ICD-10-CM

## 2020-03-20 DIAGNOSIS — B3731 Acute candidiasis of vulva and vagina: Secondary | ICD-10-CM

## 2020-03-20 MED ORDER — FLUCONAZOLE 150 MG PO TABS
150.0000 mg | ORAL_TABLET | Freq: Once | ORAL | 0 refills | Status: AC
Start: 2020-03-20 — End: 2020-03-20

## 2020-03-20 NOTE — Telephone Encounter (Signed)
Called patient to inform her that she has + yeast on her swab. Informed her that prescription has been sent to her Pharmacy. Patient voiced understanding.   Patient reports she had a procedure to see where her blockages are and that they were found in her hips and groin. She returns on 7/26 for Endometrial Biopsy. Patient want to know if the blockages may be related to what else she has going on and wants to know if she needs to come in earlier. Routing message to Dr. Harolyn Rutherford.

## 2020-03-20 NOTE — Telephone Encounter (Signed)
Gynecologic symptoms are unrelated to her blockages.  She can keep appointment as scheduled.

## 2020-03-23 ENCOUNTER — Other Ambulatory Visit: Payer: Self-pay | Admitting: Family Medicine

## 2020-03-23 DIAGNOSIS — I10 Essential (primary) hypertension: Secondary | ICD-10-CM

## 2020-04-04 ENCOUNTER — Other Ambulatory Visit: Payer: Self-pay

## 2020-04-04 DIAGNOSIS — N183 Chronic kidney disease, stage 3 unspecified: Secondary | ICD-10-CM

## 2020-04-08 ENCOUNTER — Ambulatory Visit (INDEPENDENT_AMBULATORY_CARE_PROVIDER_SITE_OTHER): Payer: Medicaid Other | Admitting: Family Medicine

## 2020-04-08 ENCOUNTER — Other Ambulatory Visit: Payer: Self-pay

## 2020-04-08 ENCOUNTER — Encounter: Payer: Self-pay | Admitting: Family Medicine

## 2020-04-08 VITALS — BP 125/89 | HR 90 | Wt 208.5 lb

## 2020-04-08 DIAGNOSIS — N950 Postmenopausal bleeding: Secondary | ICD-10-CM | POA: Diagnosis not present

## 2020-04-08 DIAGNOSIS — N83291 Other ovarian cyst, right side: Secondary | ICD-10-CM | POA: Diagnosis not present

## 2020-04-08 DIAGNOSIS — N882 Stricture and stenosis of cervix uteri: Secondary | ICD-10-CM

## 2020-04-08 MED ORDER — MISOPROSTOL 200 MCG PO TABS: 400.0000 ug | ORAL_TABLET | Freq: Two times a day (BID) | ORAL | 0 refills | Status: DC

## 2020-04-08 NOTE — Patient Instructions (Signed)
[place] [facility] Resources  [employer] [place] [address], [place], [place] [address] [phone]   or  www.[place]human-services/social-services **SNAP/EBT/ Other nutritional benefits  [place] DHHS-Public Health-WIC [address], [place], [place] [address] [phone]  or  https://[place].gov[place]human-services/health-department **WIC for  women who are pregnant and postpartum, infants and children up to 5 years old  [facility] [address], [place], [place] [address] [phone]   or   www.theblessedtable.org  **Food pantry  [facility] [address], [place], [place] [address] [phone]   or   https://[facility].godaddysites.com  **Emergency food and prepared meals  [facility] [address], [place], [place] [address] [phone]   or   www.cedargrovetop.us **Food pantry  [facility] [address], [place], [place] [address] [phone]   or   www.facebook.com/[facility]-116430931718202 **Food pantry  [facility] [address], [place], [place] [address] [phone] **Food pantry  [facility] [address], [place], [place] [address] [phone]) [phone]   or   www.[facility].org  **Food pantry and prepared meals  [facility] [address], [place], [place] [address] https://[email or web address]/  **Food pantry  [facility] [employer] [facility] [address], [place], [place] [address] [phone]   or   www.lbcnow.org  **Food pantry  [facility] [address], [place], [place] [address] [phone]   or   http://www.onestepfurther.com **Food pantry, nutrition education, gardening activities  [facility] [address], [place], [place] [address] [phone] **Food pantry  [facility] [address], [place], [place] [address] [phone]   or   www.[facility].org **Food pantry  [facility] [place] [address], [place], [place] [address] [phone]   or   http://senior-resources[place].org **Meals on Wheels Program  [facility] [address], [place],  [address] [phone]   or   www.stmattchurch.com  **Food pantry  [facility] [address], [place],  [address] [phone]   or   [facility].org **Food pantry     

## 2020-04-09 ENCOUNTER — Encounter: Payer: Self-pay | Admitting: Family Medicine

## 2020-04-09 DIAGNOSIS — N83291 Other ovarian cyst, right side: Secondary | ICD-10-CM | POA: Insufficient documentation

## 2020-04-09 HISTORY — DX: Other ovarian cyst, right side: N83.291

## 2020-04-09 NOTE — Assessment & Plan Note (Signed)
Status post attempted EMB today which was unsuccessful.  Will bring back following Cytotec administration for another attempt.

## 2020-04-09 NOTE — Assessment & Plan Note (Signed)
Appearance is compatible with a dermoid.  She has a negative CA-125.  Suspect this has been her for a long time.  She has a lot of comorbidities which makes surgery a less great option for her and so will elect to follow this up with another pelvic sonogram in 6 months.

## 2020-04-09 NOTE — Progress Notes (Signed)
Subjective:    Patient ID: Alexandra Campos is a 64 y.o. female presenting with Procedure  on 04/08/2020  HPI: Patient here with postmenopausal bleeding.  At her last visit we thought this might be excoriations due to vaginal irritation.  She has had a lot of itching and we have started her on some steroid cream which seems to have improved this.  She reports no further postmenopausal bleeding.  W/u reveals thickened endometrial stripe on pelvic sono of 6 mm.  There is also a large complex cystic mass measuring 6.4 x 6.7 x 6.6 in the right ovary suspected to be a dermoid.  She has a normal CA-125 of 17.3.  She comes in today for endometrial sampling.  Review of Systems  Constitutional: Negative for chills and fever.  Respiratory: Negative for shortness of breath.   Cardiovascular: Negative for chest pain.  Gastrointestinal: Negative for abdominal pain, nausea and vomiting.  Genitourinary: Negative for dysuria.  Skin: Negative for rash.      Objective:    BP (!) 125/89   Pulse 90   Wt (!) 208 lb 8 oz (94.6 kg)   BMI 40.72 kg/m  Physical Exam Constitutional:      General: She is not in acute distress.    Appearance: She is well-developed.  HENT:     Head: Normocephalic and atraumatic.  Eyes:     General: No scleral icterus. Cardiovascular:     Rate and Rhythm: Normal rate.  Pulmonary:     Effort: Pulmonary effort is normal.  Abdominal:     Palpations: Abdomen is soft.  Musculoskeletal:     Cervical back: Neck supple.  Skin:    General: Skin is warm and dry.  Neurological:     Mental Status: She is alert and oriented to person, place, and time.    Procedure: Patient given informed consent, signed copy in the chart, time out was performed. Appropriate time out taken.  The patient was placed in the lithotomy position and the cervix brought into view with sterile speculum.  Portio of cervix cleansed x 2 with betadine swabs.  A tenaculum was placed in the anterior lip of the  cervix.  The cervix was extremely stenotic and could not be penetrated even with an os finder.  All equipment was removed and accounted for.  The patient tolerated the procedure well.    Patient given post procedure instructions.      Assessment & Plan:   Problem List Items Addressed This Visit      Unprioritized   Postmenopausal bleeding    Status post attempted EMB today which was unsuccessful.  Will bring back following Cytotec administration for another attempt.      Relevant Medications   misoprostol (CYTOTEC) 200 MCG tablet   Complex cyst of right ovary - Primary    Appearance is compatible with a dermoid.  She has a negative CA-125.  Suspect this has been her for a long time.  She has a lot of comorbidities which makes surgery a less great option for her and so will elect to follow this up with another pelvic sonogram in 6 months.      Relevant Orders   US PELVIC COMPLETE WITH TRANSVAGINAL      Total time in review of prior notes, pathology, labs, history taking, review with patient, exam, note writing, discussion of options, plan for next steps, alternatives and risks of treatment: 15 minutes.  Return in about 4 weeks (around 05/06/2020) for repeat  EMB attempt in 4 wks, needs U/S in 6 months.  Donnamae Jude 04/09/2020 1:01 PM

## 2020-04-17 ENCOUNTER — Ambulatory Visit (INDEPENDENT_AMBULATORY_CARE_PROVIDER_SITE_OTHER): Payer: Medicaid Other | Admitting: Vascular Surgery

## 2020-04-17 ENCOUNTER — Other Ambulatory Visit: Payer: Self-pay | Admitting: Vascular Surgery

## 2020-04-17 ENCOUNTER — Ambulatory Visit (HOSPITAL_COMMUNITY)
Admission: RE | Admit: 2020-04-17 | Discharge: 2020-04-17 | Disposition: A | Discharge status: Home / Self Care | Payer: Medicaid Other | Source: Ambulatory Visit | Attending: Vascular Surgery | Admitting: Vascular Surgery

## 2020-04-17 ENCOUNTER — Other Ambulatory Visit: Payer: Self-pay

## 2020-04-17 ENCOUNTER — Encounter: Payer: Self-pay | Admitting: Vascular Surgery

## 2020-04-17 VITALS — BP 122/80 | HR 70 | Temp 97.3°F | Resp 18 | Ht 60.0 in | Wt 208.0 lb

## 2020-04-17 DIAGNOSIS — I70219 Atherosclerosis of native arteries of extremities with intermittent claudication, unspecified extremity: Secondary | ICD-10-CM

## 2020-04-17 DIAGNOSIS — I739 Peripheral vascular disease, unspecified: Secondary | ICD-10-CM | POA: Insufficient documentation

## 2020-04-17 DIAGNOSIS — N183 Chronic kidney disease, stage 3 unspecified: Secondary | ICD-10-CM | POA: Diagnosis present

## 2020-04-17 NOTE — Progress Notes (Signed)
Patient name: Alexandra Campos MRN: [id number] DOB: 1955-10-03 Sex: female  REASON FOR VISIT:   Follow-up after arteriogram.  HPI:   Alexandra Campos is a pleasant 64 y.o. female who I saw with bilateral lower extremity pain.  She was also having back issues which certainly may have been contributing to her pain.  However she did have evidence of infrainguinal arterial occlusive disease.  She came in for arteriography which was performed on 03/15/20.  She was on aspirin and was on a statin.  Since I saw her she continues to have stable bilateral lower extremity claudication.  Her symptoms are brought on by ambulation relieved with rest.  She denies any history of rest pain or nonhealing ulcers.  She is trying to quit smoking.  She had smoked a pack per day for many years but is cut back to about 3 to 4 cigarettes a day.  Current Outpatient Medications  Medication Sig Dispense Refill  . acetaminophen (TYLENOL) 500 MG tablet Take 1,000 mg by mouth every 6 (six) hours as needed for moderate pain or headache.    . albuterol (VENTOLIN HFA) 108 (90 Base) MCG/ACT inhaler Inhale 2 puffs into the lungs every 6 (six) hours as needed for wheezing or shortness of breath. 6.7 g 1  . amLODipine (NORVASC) 10 MG tablet Take 1 tablet (10 mg total) by mouth at bedtime. (Patient taking differently: Take 10 mg by mouth daily. ) 90 tablet 3  . atorvastatin (LIPITOR) 40 MG tablet Take 1 tablet by mouth once daily 90 tablet 3  . blood glucose meter kit and supplies KIT 1 each by Does not apply route daily. Dispense based on patient and insurance preference. Use up to four times daily as directed. (FOR ICD-9 250.00, 250.01). 1 each 0  . cetirizine (ZYRTEC) 10 MG tablet Take 1 tablet (10 mg total) by mouth daily. 30 tablet 11  . chlorthalidone (HYGROTON) 25 MG tablet Take 1 tablet (25 mg total) by mouth daily. 90 tablet 3  . clobetasol cream (TEMOVATE) 3.87 % Apply 1 application topically 2 (two) times daily. Bid x 3 day,  qd x 3 days then 2x/wk (Patient taking differently: Apply 1 application topically once a week. ) 30 g 0  . glucose blood (ACCU-CHEK GUIDE) test strip Please use to check blood sugar twice daily. 100 each 12  . JARDIANCE 10 MG TABS tablet Take 10 mg by mouth daily.    Marland Kitchen losartan (COZAAR) 50 MG tablet Take 1 tablet (50 mg total) by mouth daily. 90 tablet 3  . metFORMIN (GLUCOPHAGE) 1000 MG tablet Take 1 tablet (1,000 mg total) by mouth 2 (two) times daily with a meal. TAKE ONE TABLET BY MOUTH 2 TIMES A DAY WITH MEALS (Patient taking differently: Take 1,000 mg by mouth 2 (two) times daily with a meal. ) 180 tablet 3  . misoprostol (CYTOTEC) 200 MCG tablet Take 2 tablets (400 mcg total) by mouth in the morning and at bedtime. Take 2 the night prior to procedure and 2 the morning of 4 tablet 0  . neomycin-polymyxin-hydrocortisone (CORTISPORIN) OTIC solution Apply 1-2 drops daily to big toes after bath (Patient taking differently: 1-2 drops See admin instructions. Apply 1-2 drops daily to big toes after bath) 10 mL 0  . omeprazole (PRILOSEC) 20 MG capsule Take 1 capsule (20 mg total) by mouth daily. 90 capsule 0   No current facility-administered medications for this visit.    REVIEW OF SYSTEMS:  _0  denotes positive finding, _1   denotes negative finding Vascular    Leg swelling    Cardiac    Chest pain or chest pressure:    Shortness of breath upon exertion:    Short of breath when lying flat:    Irregular heart rhythm:    Constitutional    Fever or chills:     PHYSICAL EXAM:   Vitals:   04/17/20 1012  BP: 122/80  Pulse: 70  Resp: 18  Temp: (!) 97.3 F (36.3 C)  SpO2: 98%  Weight: 94.3 kg  Height: 5' (1.524 m)   Body mass index is 40.62 kg/m.  GENERAL: The patient is a well-nourished female, in no acute distress. The vital signs are documented above. CARDIOVASCULAR: There is a regular rate and rhythm. PULMONARY: There is good air exchange bilaterally without wheezing or  rales. VASCULAR: She has slightly diminished femoral pulses.  I cannot palpate pedal pulses.  DATA:   VEIN MAP: I have independently interpreted her bilateral lower extremity vein map.  On the right side the vein is noted to be tortuous and exits the fascia.  It is small with diameters ranging from 0.13-0.29 cm.  On the left side, the diameters of the vein ranged from 0.14 at the calf up to 0.3 in the thigh.  Thus the vein on both sides is marginal in size.  ARTERIOGRAPHY: I reviewed her arteriogram again.  This showed no significant inflow disease.  She had common femoral artery plaques bilaterally and superficial femoral artery occlusions bilaterally.  On both sides she would require femoral endarterectomies and femoral to below-knee popliteal artery bypasses.  MEDICAL ISSUES:   PERIPHERAL VASCULAR DISEASE: This patient has stable bilateral lower extremity claudication.  She is trying to quit smoking.  We have again had a long discussion about the importance of tobacco cessation.  We have also discussed the importance of a structured walking program.  In addition we discussed nutrition.  I favor a plant-based diet.  I think she is a poor candidate for bypass given her obesity.  Her vein map shows that she does not have adequate vein for an autogenous bypass.  She would require a prosthetic bypass with a very high risk of infection in the groins.  Thus we will try hard to avoid revascularization.  She was not a candidate for endovascular approach given her common femoral artery disease bilaterally.  I ordered follow-up ABIs in 9 months and I will see her back at that time.  She knows to call sooner if she has problems.  [name] [facility] [phone]

## 2020-04-18 ENCOUNTER — Other Ambulatory Visit: Payer: Self-pay | Admitting: *Deleted

## 2020-04-18 DIAGNOSIS — I70229 Atherosclerosis of native arteries of extremities with rest pain, unspecified extremity: Secondary | ICD-10-CM

## 2020-04-19 ENCOUNTER — Telehealth: Payer: Self-pay

## 2020-04-19 NOTE — Telephone Encounter (Signed)
Patient call nurse line stating she has been out of Losartan for ~3 weeks. Patient just not realized she has been taking 6 pills per day instead of 7. According to patient [facility] failed to refill Losartan. Patient states she feels like she does not need to be on this medication anymore, as she has not had any undesired side effects and her BP has been ok. Patients BP on 8/4 122/80 and 125/89 on 7/26. Please advise.

## 2020-04-22 ENCOUNTER — Telehealth: Payer: Self-pay | Admitting: *Deleted

## 2020-04-22 NOTE — Telephone Encounter (Addendum)
Pt left message stating that she would like Dr. Virginia Crews nurse to call her. She wants to be seen sooner because she has other issues going on. I returned pt's call and she stated that she has gotten more vaginal warts and the itching is getting worse. She further stated that the cream she had been prescribed is not helping very much anymore. I advised pt that I will send message to Dr. Kennon Rounds. We will call her back once a response has been received. Pt voiced understanding.   8/12 1209  Dr. Kennon Rounds responded with message to scheduling staff on 8/9 with instructions to schedule appt for pt. She was given appt on 8/16 @ 0915. I called pt today and confirmed she is aware of the appt to see Earlie Server, NP. PT voiced understanding and had no further questions.

## 2020-04-23 NOTE — Telephone Encounter (Signed)
If her blood pressures have been normal, she does not have to take the medication and we can discuss at next visit. It is a medication that can be protective for her kidneys, which is a reason I would encourage her to keep taking it.

## 2020-04-29 ENCOUNTER — Ambulatory Visit (INDEPENDENT_AMBULATORY_CARE_PROVIDER_SITE_OTHER): Payer: Medicaid Other | Admitting: Nurse Practitioner

## 2020-04-29 ENCOUNTER — Encounter: Payer: Self-pay | Admitting: Nurse Practitioner

## 2020-04-29 ENCOUNTER — Other Ambulatory Visit (HOSPITAL_COMMUNITY)
Admission: RE | Admit: 2020-04-29 | Discharge: 2020-04-29 | Disposition: A | Discharge status: Home / Self Care | Payer: Medicaid Other | Source: Ambulatory Visit | Attending: Nurse Practitioner | Admitting: Nurse Practitioner

## 2020-04-29 ENCOUNTER — Other Ambulatory Visit: Payer: Self-pay

## 2020-04-29 VITALS — BP 127/83 | HR 78 | Ht 60.0 in | Wt 210.6 lb

## 2020-04-29 DIAGNOSIS — B373 Candidiasis of vulva and vagina: Secondary | ICD-10-CM

## 2020-04-29 DIAGNOSIS — N393 Stress incontinence (female) (male): Secondary | ICD-10-CM | POA: Diagnosis not present

## 2020-04-29 DIAGNOSIS — N9089 Other specified noninflammatory disorders of vulva and perineum: Secondary | ICD-10-CM | POA: Diagnosis not present

## 2020-04-29 DIAGNOSIS — B3731 Acute candidiasis of vulva and vagina: Secondary | ICD-10-CM

## 2020-04-29 MED ORDER — TERCONAZOLE 0.4 % VA CREA: 1.0000 | TOPICAL_CREAM | Freq: Every day | VAGINAL | 0 refills | Status: DC

## 2020-04-29 MED ORDER — VALACYCLOVIR HCL 1 G PO TABS: 1000.0000 mg | ORAL_TABLET | Freq: Every day | ORAL | 0 refills | Status: AC

## 2020-04-29 NOTE — Progress Notes (Signed)
GYNECOLOGY OFFICE VISIT NOTE   History:  64 y.o. No obstetric history on file. here today for genital itching that she thinks is warts and burning with urination. She denies any abnormal vaginal discharge, bleeding, pelvic pain or other concerns. Wears peripad always due to incontinence when she sneezes or coughs.  Wears at night as she might have a coughing episode.  Has had pain in genital area for 3-4 days.  Denies any fever or malaise.  Has never had HSV.  Has treated several times for yeast with clotrimazole and it has not helped. Complains of periodic boils in genital area and thinks she has a boil now.  Has had genital iitching and has been scratching.  Reports no sex in 10 years.  Past Medical History:  Diagnosis Date  . Arthritis   . Diabetes mellitus without complication ([place])   . Hypertension     Past Surgical History:  Procedure Laterality Date  . ABDOMINAL AORTOGRAM W/LOWER EXTREMITY N/A 03/15/2020   Procedure: ABDOMINAL AORTOGRAM W/LOWER EXTREMITY;  Surgeon: Angelia Mould, MD;  Location: [facility];  Service: Cardiovascular;  Laterality: N/A;  . ANKLE SURGERY Right   . INDUCED ABORTION    . LYMPHADENECTOMY      The following portions of the patient's history were reviewed and updated as appropriate: allergies, current medications, past family history, past medical history, past social history, past surgical history and problem list.   Health Maintenance:  Normal pap on 11-09-19.  Normal mammogram on 07-03-19   Review of Systems:  Pertinent items noted in HPI and remainder of comprehensive ROS otherwise negative.  Objective:  Physical Exam BP 127/83   Pulse 78   Ht 5' (1.524 m)   Wt 210 lb 9.6 oz (95.5 kg)   BMI 41.13 kg/m  CONSTITUTIONAL: Well-developed, well-nourished female in no acute distress.  HENT:  Normocephalic, atraumatic. External right and left ear normal.  EYES: Conjunctivae and EOM are normal. Pupils are equal, round.  No scleral  icterus.  NECK: Normal range of motion, supple SKIN: Skin is warm and dry. No rash noted. Not diaphoretic. No erythema. No pallor. NEUROLOGIC: Alert and oriented to person, place, and time. Normal muscle tone coordination. No cranial nerve deficit noted. PSYCHIATRIC: Normal mood and affect. Normal behavior. Normal judgment and thought content. CARDIOVASCULAR: Normal heart rate noted RESPIRATORY: Effort and breath sounds normal, no problems with respiration noted PELVIC: Normal appearing external genitalia; normal appearing vaginal mucosa and cervix.  No abnormal discharge noted.  Normal uterine size, no other palpable masses, no uterine or adnexal tenderness.  No drainage from lesions.  Vaginal discharge pale yellow and small amount. MUSCULOSKELETAL: Normal range of motion. No edema noted.  Labs and Imaging VAS [place] LOWER EXTREMITY SAPHENOUS VEIN MAPPING  Result Date: 04/17/2020 LOWER EXTREMITY VEIN MAPPING Indications: Pre-op  Comparison Study: none Performing Technologist: June Leap RDMS, RVT  Examination Guidelines: A complete evaluation includes B-mode imaging, spectral Doppler, color Doppler, and power Doppler as needed of all accessible portions of each vessel. Bilateral testing is considered an integral part of a complete examination. Limited examinations for reoccurring indications may be performed as noted. +-------------+---------------+-------------------+-------------+--------------+  RT Diameter   RT Findings          GSV         LT Diameter  LT Findings       (cm)                                           (  cm)                    +-------------+---------------+-------------------+-------------+--------------+     0.37                      Saphenofemoral       0.42                                                     Junction                                  +-------------+---------------+-------------------+-------------+--------------+     0.29        branching      Proximal thigh       0.25                    +-------------+---------------+-------------------+-------------+--------------+     0.25      out of fascia      Mid thigh         0.30                    +-------------+---------------+-------------------+-------------+--------------+     0.20      tortuous/ out    Distal thigh        0.24       branching                    of fascia                                                  +-------------+---------------+-------------------+-------------+--------------+     0.20        tortuous           Knee            0.28                    +-------------+---------------+-------------------+-------------+--------------+     0.15                         Prox calf         0.14       branching    +-------------+---------------+-------------------+-------------+--------------+     0.13      branching and      Mid calf                   not visualized                 tortuous                                                   +-------------+---------------+-------------------+-------------+--------------+              not visualized     Distal calf                 not visualized +-------------+---------------+-------------------+-------------+--------------+ Diagnosing physician: [name] MD  Electronically signed by [name] MD on 04/17/2020 at 2:50:06 PM.    Final     Assessment & Plan:  1. Stress incontinence of urine Will refer for evaluation and treatment.  Has used medication for incontinence before but did not see any improvement.  Wears a pad around the clock to avoid any accidents but with yeast infection it may be helpful not to wear underwear at night.  Discussed other options for her -  Put protective pad under the sheet and do not wear underwear at night.  - AMB referral to rehabilitation  2. Yeast infection involving the vagina and surrounding area Cream she has been using is not working  well. Will give Terazol cream which is not covered by her insurance but Good RX coupon given and client reports she will try the terazol cream externally.  3. Vulvar lesion Suspicious lesions - HSV culture done Will treat with Valtrex in case it is an HSV outbreak but client has not had HSV to her knowledge before.  Could be only a boil but could be HSV that somes and goes.  Will see if treatment today is helpful in helping area resolve.  Culture results pending.   Routine preventative health maintenance measures emphasized. Please refer to After Visit Summary for other counseling recommendations.   Return for keep scheduled appointment.   Total face-to-face time with patient: 15 minutes.  Over 50% of encounter was spent on counseling and coordination of care.  Earlie Server, RN, MSN, NP-BC Nurse Practitioner, [facility] for [employer] [employer] [facility] 04/29/2020 2:02 PM

## 2020-04-30 ENCOUNTER — Other Ambulatory Visit: Payer: Self-pay

## 2020-04-30 ENCOUNTER — Ambulatory Visit: Payer: Medicaid Other

## 2020-04-30 ENCOUNTER — Telehealth: Payer: Self-pay

## 2020-04-30 LAB — CERVICOVAGINAL ANCILLARY ONLY
Bacterial Vaginitis (gardnerella): NEGATIVE
Candida Glabrata: NEGATIVE
Candida Vaginitis: POSITIVE — AB
Chlamydia: NEGATIVE
Comment: NEGATIVE
Comment: NEGATIVE
Comment: NEGATIVE
Comment: NEGATIVE
Comment: NEGATIVE
Comment: NORMAL
Neisseria Gonorrhea: NEGATIVE
Trichomonas: NEGATIVE

## 2020-04-30 LAB — HERPES SIMPLEX VIRUS CULTURE

## 2020-04-30 NOTE — Telephone Encounter (Signed)
Returned pts called regarding test results, advised that she only tested positive for a Yeast Infection, not Herpes. Advised not to take Valtrex & Terconazole was sent to her Pharmacy. Pt verbalized understanding.

## 2020-04-30 NOTE — Telephone Encounter (Signed)
Pt. called Nurse line today at 2:23p regarding test results from 04/29/20.

## 2020-05-02 NOTE — Telephone Encounter (Signed)
Patient contacted and informed of below. Patient had additional concerns about Amlodipine. Patient scheduled with PCP next Friday to discuss.

## 2020-05-06 ENCOUNTER — Other Ambulatory Visit: Payer: Self-pay

## 2020-05-06 ENCOUNTER — Ambulatory Visit (INDEPENDENT_AMBULATORY_CARE_PROVIDER_SITE_OTHER): Payer: Medicaid Other

## 2020-05-06 DIAGNOSIS — Z111 Encounter for screening for respiratory tuberculosis: Secondary | ICD-10-CM | POA: Diagnosis present

## 2020-05-06 NOTE — Progress Notes (Signed)
Patient is here for a PPD placement  It was placed on 05/06/2020 in the left forearm @ 1:52 pm.    Scheduled patient follow up nurse visit for PPD read on Thursday 8/26 at 1330.   Marland Kitchen Talbot Grumbling, RN

## 2020-05-08 ENCOUNTER — Ambulatory Visit: Payer: Medicaid Other | Admitting: Family Medicine

## 2020-05-09 ENCOUNTER — Other Ambulatory Visit: Payer: Self-pay

## 2020-05-09 ENCOUNTER — Ambulatory Visit (INDEPENDENT_AMBULATORY_CARE_PROVIDER_SITE_OTHER): Payer: Medicaid Other

## 2020-05-09 DIAGNOSIS — Z111 Encounter for screening for respiratory tuberculosis: Secondary | ICD-10-CM

## 2020-05-09 LAB — TB SKIN TEST
Induration: 0 mm
TB Skin Test: NEGATIVE

## 2020-05-09 NOTE — Progress Notes (Signed)
Patient is here for a PPD read.  It was placed on 05/06/2020 in the left forearm @ 1:52 pm.    PPD RESULTS:  Result: negative Induration: 0 mm  Letter created and given to patient for documentation purposes. Talbot Grumbling, RN

## 2020-05-10 ENCOUNTER — Ambulatory Visit: Payer: Medicaid Other | Admitting: Family Medicine

## 2020-05-10 NOTE — Progress Notes (Deleted)
PROVIDER AGENDA:  *** Current Outpatient Medications  Medication Instructions  . acetaminophen (TYLENOL) 1,000 mg, Oral, Every 6 hours PRN  . albuterol (VENTOLIN HFA) 108 (90 Base) MCG/ACT inhaler 2 puffs, Inhalation, Every 6 hours PRN  . amLODipine (NORVASC) 10 mg, Oral, Daily at bedtime  . atorvastatin (LIPITOR) 40 MG tablet Take 1 tablet by mouth once daily  . blood glucose meter kit and supplies KIT 1 each, Does not apply, Daily, Dispense based on patient and insurance preference. Use up to four times daily as directed. (FOR ICD-9 250.00, 250.01).  . cetirizine (ZYRTEC) 10 mg, Oral, Daily  . chlorthalidone (HYGROTON) 25 mg, Oral, Daily  . clobetasol cream (TEMOVATE) 0.05 % 1 application, Topical, 2 times daily, Bid x 3 day, qd x 3 days then 2x/wk  . glucose blood (ACCU-CHEK GUIDE) test strip Please use to check blood sugar twice daily.  . Jardiance 10 mg, Oral, Daily  . losartan (COZAAR) 50 mg, Oral, Daily  . metFORMIN (GLUCOPHAGE) 1,000 mg, Oral, 2 times daily with meals, TAKE ONE TABLET BY MOUTH 2 TIMES A DAY WITH MEALS  . misoprostol (CYTOTEC) 400 mcg, Oral, 2 times daily, Take 2 the night prior to procedure and 2 the morning of  . neomycin-polymyxin-hydrocortisone (CORTISPORIN) OTIC solution Apply 1-2 drops daily to big toes after bath  . omeprazole (PRILOSEC) 20 mg, Oral, Daily  . terconazole (TERAZOL 7) 0.4 % vaginal cream 1 applicator, Vaginal, Daily at bedtime, Use for 7 days.   Health Maintenance Due  Topic Date Due  . PNEUMOCOCCAL POLYSACCHARIDE VACCINE AGE 2-64 HIGH RISK  Never done  . COVID-19 Vaccine (1) Never done  . OPHTHALMOLOGY EXAM  08/19/2019  . FOOT EXAM  11/09/2019  . HEMOGLOBIN A1C  02/15/2020  . INFLUENZA VACCINE  04/14/2020    SUBJECTIVE:  CHIEF COMPLAINT / HPI:   1. Stage 3a chronic kidney disease ***  2. Class 3 severe obesity with serious comorbidity and body mass index (BMI) of 40.0 to 44.9 in adult, unspecified obesity type (HCC) ***  3.  Essential hypertension ***  4. Type 2 DM with CKD stage 3 and hypertension (HCC) ***  5. Tobacco dependence ***    PERTINENT  PMH / PSH: ***  Patient Active Problem List   Diagnosis Date Noted  . Complex cyst of right ovary 04/09/2020  . Atherosclerosis of artery of extremity with rest pain (HCC) 03/15/2020  . Ovarian cyst 03/09/2020  . Postmenopausal bleeding 02/07/2020  . Abnormal ankle brachial index (ABI) 01/14/2020  . Abnormal facial hair 11/24/2019  . Stress incontinence of urine 10/03/2019  . Type 2 DM with CKD stage 3 and hypertension (HCC) 07/03/2019  . Class 3 severe obesity with body mass index (BMI) of 40.0 to 44.9 in adult (HCC) 06/16/2019  . CKD (chronic kidney disease) stage 3, GFR 30-59 ml/min 06/16/2019  . Healthcare maintenance 06/16/2019  . Carpal tunnel syndrome 06/16/2019  . Hypertension 08/04/2018  . Tobacco dependence 08/04/2018    OBJECTIVE:  There were no vitals taken for this visit.  ***  ASSESSMENT/PLAN:  No problem-specific Assessment & Plan notes found for this encounter.    Rachel E Kim, MD [facility]  

## 2020-05-13 ENCOUNTER — Other Ambulatory Visit: Payer: Self-pay | Admitting: Family Medicine

## 2020-05-13 DIAGNOSIS — Z8719 Personal history of other diseases of the digestive system: Secondary | ICD-10-CM

## 2020-06-03 ENCOUNTER — Telehealth: Payer: Self-pay | Admitting: Family Medicine

## 2020-06-03 NOTE — Telephone Encounter (Signed)
Access [place] Eligibilty form dropped off for at front desk for completion.  Verified that patient section of form has been completed.  Last DOS/WCC with PCP was 05/09/20. Placed form in team folder to be completed by clinical staff.  Alexandra Campos

## 2020-06-04 ENCOUNTER — Telehealth (INDEPENDENT_AMBULATORY_CARE_PROVIDER_SITE_OTHER): Payer: Medicaid Other | Admitting: Family Medicine

## 2020-06-04 DIAGNOSIS — N950 Postmenopausal bleeding: Secondary | ICD-10-CM

## 2020-06-04 NOTE — Telephone Encounter (Signed)
Returned patients call. Patient reports that she has not been able to have her repeat biopsy due to several reasons, she reports the last time the car broke down that was bringing her to the appointment.   Patient reports she needs her Cytotec re prescribed for her appointment for Endometrial Biopsy on 10/20.   Patient would like to know if PA has been sent on Terazol. She used the Good RX coupon and paid $17 that she had to borrow to pay for. She would like to have the PA filled out to see if she can be reimbursed.   Forwarded to Dr. Kennon Rounds and Earlie Server, NP for review.

## 2020-06-04 NOTE — Telephone Encounter (Signed)
Patient called into the office wanting to know if the prior authorization for her medication has been completed and sent to the pharmacist. Patient also requesting the medication she needs to take the night before and the day of her biopsy appointment.

## 2020-06-04 NOTE — Telephone Encounter (Signed)
Clinical info completed on Transportaion form.  Place form in Dr. Julianne Rice box for completion.  Nikea Settle Zimmerman Rumple, CMA

## 2020-06-07 ENCOUNTER — Other Ambulatory Visit: Payer: Self-pay | Admitting: Family Medicine

## 2020-06-07 DIAGNOSIS — Z8719 Personal history of other diseases of the digestive system: Secondary | ICD-10-CM

## 2020-06-07 NOTE — Telephone Encounter (Signed)
Form placed in RN triage box

## 2020-06-10 ENCOUNTER — Telehealth (INDEPENDENT_AMBULATORY_CARE_PROVIDER_SITE_OTHER): Payer: Medicaid Other | Admitting: Nurse Practitioner

## 2020-06-10 ENCOUNTER — Encounter: Payer: Self-pay | Admitting: Family Medicine

## 2020-06-10 ENCOUNTER — Other Ambulatory Visit: Payer: Self-pay

## 2020-06-10 ENCOUNTER — Ambulatory Visit (INDEPENDENT_AMBULATORY_CARE_PROVIDER_SITE_OTHER): Payer: Medicaid Other | Admitting: Family Medicine

## 2020-06-10 VITALS — BP 112/74 | HR 89 | Ht 60.0 in | Wt 208.0 lb

## 2020-06-10 DIAGNOSIS — B3731 Acute candidiasis of vulva and vagina: Secondary | ICD-10-CM

## 2020-06-10 DIAGNOSIS — N183 Chronic kidney disease, stage 3 unspecified: Secondary | ICD-10-CM

## 2020-06-10 DIAGNOSIS — F17200 Nicotine dependence, unspecified, uncomplicated: Secondary | ICD-10-CM

## 2020-06-10 DIAGNOSIS — I70229 Atherosclerosis of native arteries of extremities with rest pain, unspecified extremity: Secondary | ICD-10-CM | POA: Diagnosis not present

## 2020-06-10 DIAGNOSIS — E1122 Type 2 diabetes mellitus with diabetic chronic kidney disease: Secondary | ICD-10-CM

## 2020-06-10 DIAGNOSIS — N950 Postmenopausal bleeding: Secondary | ICD-10-CM

## 2020-06-10 DIAGNOSIS — I129 Hypertensive chronic kidney disease with stage 1 through stage 4 chronic kidney disease, or unspecified chronic kidney disease: Secondary | ICD-10-CM

## 2020-06-10 DIAGNOSIS — B373 Candidiasis of vulva and vagina: Secondary | ICD-10-CM | POA: Diagnosis not present

## 2020-06-10 HISTORY — DX: Acute candidiasis of vulva and vagina: B37.31

## 2020-06-10 LAB — POCT GLYCOSYLATED HEMOGLOBIN (HGB A1C): HbA1c, POC (controlled diabetic range): 8.2 % — AB (ref 0.0–7.0)

## 2020-06-10 MED ORDER — LANCETS THIN MISC: 1.0000 | 0 refills | Status: DC | PRN

## 2020-06-10 MED ORDER — NICOTINE POLACRILEX 4 MG MT GUM: 4.0000 mg | CHEWING_GUM | OROMUCOSAL | 0 refills | Status: DC | PRN

## 2020-06-10 MED ORDER — SHINGRIX 50 MCG/0.5ML IM SUSR: 0.5000 mL | Freq: Once | INTRAMUSCULAR | 0 refills | Status: AC

## 2020-06-10 MED ORDER — TERCONAZOLE 0.4 % VA CREA: 1.0000 | TOPICAL_CREAM | Freq: Every day | VAGINAL | 0 refills | Status: DC

## 2020-06-10 NOTE — Telephone Encounter (Signed)
Received a call from this patient stating she was suppose to be getting a call back from a nurse. She stated she was given a coupon to get her prescription. Then she stated Lawernce Keas NP, was suppose to send a message to someone about authorizing this so the patient didn't have to pay anything. She wants to know why no one has called her back yet.

## 2020-06-10 NOTE — Assessment & Plan Note (Signed)
A1c is 8.2 today, up from 7.2.  Will not make any changes in her medications at this time.  Obtaining BMP today.  Follow-up in 3 months

## 2020-06-10 NOTE — Assessment & Plan Note (Signed)
>>  ASSESSMENT AND PLAN FOR TOBACCO DEPENDENCE WRITTEN ON 06/10/2020  4:54 PM BY KIM, RACHEL E, MD  -Instructed patient to make appointment with Dr. Koval to aid in tobacco cessation given multiple previous attempts - Sent nicorette  4 mg to the pharmacy

## 2020-06-10 NOTE — Progress Notes (Signed)
    SUBJECTIVE:  CHIEF COMPLAINT / HPI:   1. Type 2 DM with CKD stage 3 and hypertension ([place]) Patient here for follow-up of Type 2 diabetes mellitus.  Current symptoms/problems include none  Known diabetic complications: nephropathy, cardiovascular disease and peripheral vascular disease Cardiovascular risk factors: diabetes mellitus, dyslipidemia, hypertension, microalbuminuria, obesity (BMI >= 30 kg/m2), sedentary lifestyle and smoking/ tobacco exposure Current diabetic medications include oral agents (dual therapy): Glucophage, jardiance.   Eye exam current (within one year): no Weight trend: decreasing steadily Prior visit with dietician: no Current diet: not asked Current exercise: none  Current monitoring regimen: home blood tests - If feeling poorly Home blood sugar records: 100-150's  Any episodes of hypoglycemia? no  Is She on ACE inhibitor or angiotensin II receptor blocker? Yes, losartan (Cozaar)  2. Tobacco Cessation  Has called the 1-800 number. They have sent her gum, patches, lozenges. She has tried a breathing machine. She knows that she should quit smoking and wants to but relies on it as a crutch.  Has tried Wellbutrin but was having diarrhea so she discontinued.   3. Vaginal Itching  Still waiting to get biopsy with OBGYN. Multiple transportation issues have kept her from going.  Currently vaginal itching.  Was treated with terconazole with great improvement previously.  No abnormal discharge. Mild pain with urination.   PERTINENT  PMH / PSH: CKD 3, DM II, HTN   OBJECTIVE:  BP 112/74   Pulse 89   Ht 5' (1.524 m)   Wt 208 lb (94.3 kg)   SpO2 99%   BMI 40.62 kg/m   General: NAD, non-toxic, well-appearing, sitting comfortably on exam table    HEENT: Veteran/AT. EOMI.  Integumentary: No obvious rashes, lesions, trauma on general exam. Neuro: A & O x4. CN grossly intact. No FND\ Diabetic foot exam was performed with the following findings:   No deformities,  ulcerations, or other skin breakdown Normal sensation of 10g monofilament Intact posterior tibialis and dorsalis pedis pulses      ASSESSMENT/PLAN:  Yeast infection involving the vagina and surrounding area -Patient scheduled to see OB/GYN on 07/03/2020.  She is reminded to obtain the misoprostol prior to the appointment. -We will prescribe terconazole given patient's symptoms today, though I think this likely more so due to her vaginal atrophy.  Tobacco dependence -Instructed patient to make appointment with Dr. Valentina Lucks to aid in tobacco cessation given multiple previous attempts - Sent nicorette 4 mg to the pharmacy   Type 2 DM with CKD stage 3 and hypertension ([place]) A1c is 8.2 today, up from 7.2.  Will not make any changes in her medications at this time.  Obtaining BMP today.  Follow-up in 3 months    Wilber Oliphant, MD [place]

## 2020-06-10 NOTE — Patient Instructions (Addendum)
The Power of Habit by Skipper Cliche   Sending you to make an appointment with Dr. Valentina Lucks  Giving you a list of dentists in the area

## 2020-06-10 NOTE — Telephone Encounter (Signed)
Form faxed per request. Copy made for batch scanning.

## 2020-06-10 NOTE — Assessment & Plan Note (Signed)
-  Patient scheduled to see OB/GYN on 07/03/2020.  She is reminded to obtain the misoprostol prior to the appointment. -We will prescribe terconazole given patient's symptoms today, though I think this likely more so due to her vaginal atrophy.

## 2020-06-10 NOTE — Assessment & Plan Note (Signed)
-  Instructed patient to make appointment with Dr. Valentina Lucks to aid in tobacco cessation given multiple previous attempts - Sent nicorette 4 mg to the pharmacy

## 2020-06-11 ENCOUNTER — Other Ambulatory Visit: Payer: Self-pay | Admitting: Nurse Practitioner

## 2020-06-11 LAB — BASIC METABOLIC PANEL
BUN/Creatinine Ratio: 24 (ref 12–28)
BUN: 28 mg/dL — ABNORMAL HIGH (ref 8–27)
CO2: 24 mmol/L (ref 20–29)
Calcium: 10.2 mg/dL (ref 8.7–10.3)
Chloride: 100 mmol/L (ref 96–106)
Creatinine, Ser: 1.19 mg/dL — ABNORMAL HIGH (ref 0.57–1.00)
GFR calc Af Amer: 57 mL/min/{1.73_m2} — ABNORMAL LOW (ref >59)
GFR calc non Af Amer: 49 mL/min/{1.73_m2} — ABNORMAL LOW (ref >59)
Glucose: 169 mg/dL — ABNORMAL HIGH (ref 65–99)
Potassium: 3.6 mmol/L (ref 3.5–5.2)
Sodium: 140 mmol/L (ref 134–144)

## 2020-06-11 MED ORDER — TERCONAZOLE 0.4 % VA CREA: 1.0000 | TOPICAL_CREAM | Freq: Every day | VAGINAL | 6 refills | Status: DC

## 2020-06-11 NOTE — Progress Notes (Signed)
Client got one prescription of Terazol with [facility] and it did help her yeast infection but does need additional treatment.  Due to hyperglycemia, client has difficulty clearing yeast infections.  She may need repeat treatments throughout the year and she has tried clotrimazole which does not clear her yeast infection.  Her insurance will likely require PA but will fill that out when it comes through from the pharmacy.  Earlie Server, RN, MSN, NP-BC Nurse Practitioner, [facility] for [employer], [facility] 06/11/2020 8:18 AM

## 2020-06-12 MED ORDER — MISOPROSTOL 200 MCG PO TABS: 400.0000 ug | ORAL_TABLET | Freq: Two times a day (BID) | ORAL | 0 refills | Status: DC

## 2020-06-12 NOTE — Addendum Note (Signed)
Addended by: Donnamae Jude on: 06/12/2020 01:42 PM   Modules accepted: Orders

## 2020-06-12 NOTE — Telephone Encounter (Signed)
Called patient to inform her that Cytotec has been called into her Pharmacy for taking before her EBX. Patient voiced understanding.

## 2020-06-12 NOTE — Telephone Encounter (Signed)
Returned patients call. Informed her that NNP is working on [employer] for [employer]. She reports she has spoken with the [employer] and if PA is filled out and back dated to she can get a refund on the money she paid for the [employer]. Asked patient to call insurance company and ask for PA be faxed to [phone] so it can be filled out. Informed patient we do not currently have a PA in house.   Patient also asking about Cytotec to take prior to [place]. Routed to Dr. Kennon Rounds for advisement.

## 2020-06-14 ENCOUNTER — Ambulatory Visit: Payer: Medicaid Other | Admitting: Physical Therapy

## 2020-06-17 ENCOUNTER — Encounter: Payer: Medicaid Other | Admitting: Physical Therapy

## 2020-06-19 NOTE — Telephone Encounter (Signed)
Patient calls nurse line stating that [facility] did not receive her forms. Re faxed forms to access [place] [facility]) at [phone].   Talbot Grumbling, RN

## 2020-06-26 ENCOUNTER — Encounter: Payer: Medicaid Other | Admitting: Physical Therapy

## 2020-07-03 ENCOUNTER — Ambulatory Visit (INDEPENDENT_AMBULATORY_CARE_PROVIDER_SITE_OTHER): Payer: Medicaid Other | Admitting: Family Medicine

## 2020-07-03 ENCOUNTER — Encounter: Payer: Medicaid Other | Admitting: Physical Therapy

## 2020-07-03 ENCOUNTER — Encounter: Payer: Self-pay | Admitting: Family Medicine

## 2020-07-03 ENCOUNTER — Other Ambulatory Visit: Payer: Self-pay

## 2020-07-03 ENCOUNTER — Other Ambulatory Visit (HOSPITAL_COMMUNITY)
Admission: RE | Admit: 2020-07-03 | Discharge: 2020-07-03 | Disposition: A | Discharge status: Home / Self Care | Payer: Medicaid Other | Source: Ambulatory Visit | Attending: Family Medicine | Admitting: Family Medicine

## 2020-07-03 VITALS — BP 123/78 | HR 81 | Wt 207.6 lb

## 2020-07-03 DIAGNOSIS — N950 Postmenopausal bleeding: Secondary | ICD-10-CM | POA: Diagnosis present

## 2020-07-03 DIAGNOSIS — N83291 Other ovarian cyst, right side: Secondary | ICD-10-CM | POA: Diagnosis not present

## 2020-07-03 DIAGNOSIS — B373 Candidiasis of vulva and vagina: Secondary | ICD-10-CM | POA: Diagnosis not present

## 2020-07-03 DIAGNOSIS — B3731 Acute candidiasis of vulva and vagina: Secondary | ICD-10-CM

## 2020-07-03 DIAGNOSIS — N83201 Unspecified ovarian cyst, right side: Secondary | ICD-10-CM

## 2020-07-03 DIAGNOSIS — Z23 Encounter for immunization: Secondary | ICD-10-CM | POA: Diagnosis not present

## 2020-07-03 MED ORDER — TERCONAZOLE 0.4 % VA CREA
1.0000 | TOPICAL_CREAM | Freq: Every day | VAGINAL | 6 refills | Status: DC
Start: 2020-07-03 — End: 2020-10-22

## 2020-07-03 MED ORDER — TRIAMCINOLONE ACETONIDE 0.025 % EX OINT: 1.0000 "application " | TOPICAL_OINTMENT | Freq: Two times a day (BID) | CUTANEOUS | 0 refills | Status: DC

## 2020-07-03 NOTE — Assessment & Plan Note (Signed)
F/u u/s in 6 months in January

## 2020-07-03 NOTE — Assessment & Plan Note (Signed)
?   Chronic yeast vs. Lichen planus or other. Given rx for terconazole and kennalog to see if that helps.

## 2020-07-03 NOTE — Assessment & Plan Note (Deleted)
F/u u/s in 6 months in January

## 2020-07-03 NOTE — Assessment & Plan Note (Signed)
S/p biopsy, minimal tissue, pathology will be reviewed via phone if needed. Patient given post procedure instructions.

## 2020-07-03 NOTE — Patient Instructions (Signed)

## 2020-07-03 NOTE — Progress Notes (Signed)
   Subjective:    Patient ID: JESALYN FINAZZO is a 64 y.o. female presenting with Endo Biopsy  on 07/03/2020  HPI: Here for further attempt at EMB. Previous attempt failed in pt with PMB and thickened stripe s/p cytotec x 2. Still with vaginal itching. Prior attempt to give her clobetasol, but this could not be filled. + yeast on last wet prep.  Review of Systems  Constitutional: Negative for chills and fever.  Respiratory: Negative for shortness of breath.   Cardiovascular: Negative for chest pain.  Gastrointestinal: Negative for abdominal pain, nausea and vomiting.  Genitourinary: Negative for dysuria.  Skin: Negative for rash.      Objective:    BP 123/78   Pulse 81   Wt 207 lb 9.6 oz (94.2 kg)   BMI 40.54 kg/m  Physical Exam Constitutional:      General: She is not in acute distress.    Appearance: She is well-developed.  HENT:     Head: Normocephalic and atraumatic.  Eyes:     General: No scleral icterus. Cardiovascular:     Rate and Rhythm: Normal rate.  Pulmonary:     Effort: Pulmonary effort is normal.  Abdominal:     Palpations: Abdomen is soft.  Musculoskeletal:     Cervical back: Neck supple.  Skin:    General: Skin is warm and dry.  Neurological:     Mental Status: She is alert and oriented to person, place, and time.     Procedure: Patient given informed consent, signed copy in the chart, time out was performed. Appropriate time out taken. . The patient was placed in the lithotomy position and the cervix brought into view with sterile speculum.  Portio of cervix cleansed x 2 with betadine swabs.  A tenaculum was placed in the anterior lip of the cervix. An os finder was used and passed easily. The uterus was sounded for depth of 7 cm. A pipelle was introduced to into the uterus, suction created,  and an endometrial sample was obtained. All equipment was removed and accounted for.  The patient tolerated the procedure well.        Assessment & Plan:     Problem List Items Addressed This Visit      Unprioritized   Postmenopausal bleeding - Primary    S/p biopsy, minimal tissue, pathology will be reviewed via phone if needed. Patient given post procedure instructions.      Relevant Orders   US PELVIC COMPLETE WITH TRANSVAGINAL   Surgical pathology( [place] [facility]/ [facility])   Complex cyst of right ovary    F/u u/s in 6 months in January      Yeast infection involving the vagina and surrounding area    ? Chronic yeast vs. Lichen planus or other. Given rx for terconazole and kennalog to see if that helps.       Other Visit Diagnoses    Need for influenza vaccination         Flu shot today   Total time in review of prior notes, pathology, labs, history taking, review with patient, exam, note writing, discussion of options, plan for next steps, alternatives and risks of treatment: 18 minutes.  Return in about 3 months (around 10/03/2020).  Donnamae Jude 07/04/2020 8:22 AM

## 2020-07-04 ENCOUNTER — Encounter: Payer: Self-pay | Admitting: *Deleted

## 2020-07-05 LAB — SURGICAL PATHOLOGY

## 2020-07-08 ENCOUNTER — Other Ambulatory Visit: Payer: Self-pay

## 2020-07-08 ENCOUNTER — Telehealth: Payer: Self-pay

## 2020-07-08 ENCOUNTER — Ambulatory Visit (INDEPENDENT_AMBULATORY_CARE_PROVIDER_SITE_OTHER): Payer: Medicaid Other

## 2020-07-08 DIAGNOSIS — Z23 Encounter for immunization: Secondary | ICD-10-CM

## 2020-07-08 NOTE — Progress Notes (Signed)
Patient presents to nurse clinic for booster covid vaccination and pneumovax. Precepted with Dr. Owens Shark prior to administration to verify if pneumovax is indicated for patient. Per Dr. Owens Shark, administered pneumovax in LD, site unremarkable, tolerated injection well.     Covid-19 Vaccination Clinic  Name:  Alexandra Campos    MRN: [id number] DOB: 1956-05-12  07/08/2020   Patient presents to nurse clinic for booster COVID vaccination. Verified that second dose was greater than 6 months ago. Patient answers no to all screening questions and denies previous allergic reaction to vaccination. Administered in RD, site unremarkable, tolerated injection well.    Ms. Glazer was observed post Covid-19 immunization for 15 minutes without incident. She was provided with Vaccine Information Sheet and instruction to access the V-Safe system.   Ms. Gaba was instructed to call 911 with any severe reactions post vaccine: Marland Kitchen Difficulty breathing  . Swelling of face and throat  . A fast heartbeat  . A bad rash all over body  . Dizziness and weakness  Patient provided with updated immunization card.   Talbot Grumbling, RN

## 2020-07-08 NOTE — Telephone Encounter (Signed)
-----   Message from Donnamae Jude, MD sent at 07/05/2020  6:15 PM EDT ----- Her biopsy is normal. Please inform pt

## 2020-07-08 NOTE — Telephone Encounter (Signed)
Called patient and informed of Normal Biopsy, patient verbalized understanding and ended the call.

## 2020-07-10 ENCOUNTER — Encounter: Payer: Medicaid Other | Admitting: Physical Therapy

## 2020-07-17 ENCOUNTER — Encounter: Payer: Medicaid Other | Admitting: Physical Therapy

## 2020-08-21 DIAGNOSIS — H1045 Other chronic allergic conjunctivitis: Secondary | ICD-10-CM | POA: Diagnosis not present

## 2020-08-21 DIAGNOSIS — H5213 Myopia, bilateral: Secondary | ICD-10-CM | POA: Diagnosis not present

## 2020-08-21 DIAGNOSIS — E119 Type 2 diabetes mellitus without complications: Secondary | ICD-10-CM | POA: Diagnosis not present

## 2020-08-21 DIAGNOSIS — H52223 Regular astigmatism, bilateral: Secondary | ICD-10-CM | POA: Diagnosis not present

## 2020-08-21 DIAGNOSIS — H35033 Hypertensive retinopathy, bilateral: Secondary | ICD-10-CM | POA: Diagnosis not present

## 2020-08-21 DIAGNOSIS — H524 Presbyopia: Secondary | ICD-10-CM | POA: Diagnosis not present

## 2020-08-21 DIAGNOSIS — H25813 Combined forms of age-related cataract, bilateral: Secondary | ICD-10-CM | POA: Diagnosis not present

## 2020-08-21 LAB — HM DIABETES EYE EXAM

## 2020-08-29 ENCOUNTER — Telehealth: Payer: Self-pay

## 2020-08-29 DIAGNOSIS — Z1211 Encounter for screening for malignant neoplasm of colon: Secondary | ICD-10-CM

## 2020-08-29 NOTE — Telephone Encounter (Signed)
Patient calls nurse line regarding colonoscopy referral. Patient states that she thought that referral was placed, however, I am unable to find anything in the system   To PCP  Talbot Grumbling, RN .

## 2020-09-18 NOTE — Patient Instructions (Incomplete)
Thank you for coming to see me today. It was a pleasure.   Take Tylenol 1300 mg three times a day for 14 days Apply Voltaren gel 4 times a day to the affected area for 14 days  Daily back and hip exercises provided.  Do these daily.  This may take some time to get better.  If you continue to have pain or symptoms worsen after 6 weeks please let your PCP know and we can further evaluate.  Please follow-up with your PCP in 1 week to discuss your diabetes and health maintenance screening.  If you have any questions or concerns, please do not hesitate to call the office at [phone].  Best,   Dana Allan, MD  Family Medicine Residency

## 2020-09-18 NOTE — Progress Notes (Signed)
° ° °  SUBJECTIVE:   CHIEF COMPLAINT / HPI: back pain Back Pain Patient reports having fallen on New Year's eve.  She was attempting to get up in the truck and slipped and fell on her backside.  She denies any syncopal episodes, loss of consciousness or hitting her head.  She reports after she had fallen she was able to get up and weight-bear.  Since that time she has noticed that she has had an ache in her right lower back sometimes can be sharp pain is occurs daily.  She has tried Tylenol 2 to 3 tablets once a day with some relief.  She reports that she has had back issues prior to her fall.  Back pain has not worsened since her fall.  Back pain is located in the right side, and does not radiate to groin or to left side.  Denies any urinary symptoms or fevers.  Denies any incontinence of urine or bowel.  Does not report any numbness or weakness in the lower extremities.    PERTINENT  PMH / PSH:  DM Type 2  OBJECTIVE:   BP (!) 146/82    Pulse 73    Wt 210 lb (95.3 kg)    SpO2 98%    BMI 41.01 kg/m    General: Alert, no acute distress Cardio: Normal S1 and S2, RRR, no r/m/g Pulm: CTAB, normal work of breathing Abdomen: Bowel sounds normal. Abdomen soft and non-tender.  MSK: No point tenderness over vertebral area.  Mild tenderness over right ischial area.  No ecchymosis or redness noted.  No peripheral edema.  5/5 strength in lower extremities.  Range of motion within normal limits.   Neuro: Cranial nerves grossly intact, motor, sensory and gait intact   ASSESSMENT/PLAN:   Low back pain Likely muscle strain secondary to fall.  Able to weight-bear and no point tenderness.  No x-rays warranted at this time. Hip and back exercises provided Tylenol 1300 mg 3 times daily x14/7 Diclofenac gel 4 times daily x14/7 Can consider imaging if symptoms worsen after 6 weeks Follow-up with PCP as needed     Dana Allan, MD [facility]

## 2020-09-19 ENCOUNTER — Other Ambulatory Visit: Payer: Self-pay

## 2020-09-19 ENCOUNTER — Ambulatory Visit (INDEPENDENT_AMBULATORY_CARE_PROVIDER_SITE_OTHER): Payer: Medicare HMO | Admitting: Family Medicine

## 2020-09-19 DIAGNOSIS — Z6841 Body Mass Index (BMI) 40.0 and over, adult: Secondary | ICD-10-CM | POA: Diagnosis not present

## 2020-09-19 DIAGNOSIS — M545 Low back pain, unspecified: Secondary | ICD-10-CM

## 2020-09-19 DIAGNOSIS — E119 Type 2 diabetes mellitus without complications: Secondary | ICD-10-CM | POA: Diagnosis not present

## 2020-09-19 MED ORDER — ACETAMINOPHEN ER 650 MG PO TBCR: 650.0000 mg | EXTENDED_RELEASE_TABLET | Freq: Three times a day (TID) | ORAL | 0 refills | Status: AC

## 2020-09-19 MED ORDER — DICLOFENAC SODIUM 1 % EX GEL: 4.0000 g | Freq: Four times a day (QID) | CUTANEOUS | 0 refills | Status: DC

## 2020-09-22 ENCOUNTER — Encounter: Payer: Self-pay | Admitting: Family Medicine

## 2020-09-22 DIAGNOSIS — M545 Low back pain, unspecified: Secondary | ICD-10-CM | POA: Insufficient documentation

## 2020-09-22 NOTE — Assessment & Plan Note (Signed)
Likely muscle strain secondary to fall.  Able to weight-bear and no point tenderness.  No x-rays warranted at this time. Hip and back exercises provided Tylenol 1300 mg 3 times daily x14/7 Diclofenac gel 4 times daily x14/7 Can consider imaging if symptoms worsen after 6 weeks Follow-up with PCP as needed

## 2020-09-23 ENCOUNTER — Telehealth: Payer: Self-pay

## 2020-09-23 DIAGNOSIS — H52223 Regular astigmatism, bilateral: Secondary | ICD-10-CM | POA: Diagnosis not present

## 2020-09-23 DIAGNOSIS — H524 Presbyopia: Secondary | ICD-10-CM | POA: Diagnosis not present

## 2020-09-23 NOTE — Telephone Encounter (Signed)
Patient called to report a knot behind her right knee that she is afraid is a blood clot. She is having some back pain and is still experiencing claudication, but denies leg swelling or rest pain. The knot is less than the size of a dime, and is not red or painful. She denies ulcers.Advised her to call back if she developed leg swelling and to contact primary care. Patient will have follow up with [place] with ABIs in May.

## 2020-10-20 ENCOUNTER — Other Ambulatory Visit: Payer: Self-pay | Admitting: Family Medicine

## 2020-11-08 ENCOUNTER — Other Ambulatory Visit: Payer: Self-pay | Admitting: Family Medicine

## 2020-11-08 ENCOUNTER — Other Ambulatory Visit: Payer: Self-pay | Admitting: Pharmacist

## 2020-11-08 DIAGNOSIS — Z1231 Encounter for screening mammogram for malignant neoplasm of breast: Secondary | ICD-10-CM

## 2020-11-10 ENCOUNTER — Other Ambulatory Visit: Payer: Self-pay | Admitting: Family Medicine

## 2020-11-14 ENCOUNTER — Other Ambulatory Visit: Payer: Self-pay | Admitting: Family Medicine

## 2020-11-19 ENCOUNTER — Ambulatory Visit: Payer: Medicare HMO | Admitting: Family Medicine

## 2020-11-19 NOTE — Progress Notes (Deleted)
    SUBJECTIVE:   CHIEF COMPLAINT / HPI:   Follow-up back pain Last seen on 09/19/2020 Prescribed Tylenol, Voltaren gel ***  T2DM with CKD Current regimen: Jardiance 10 mg daily, Metformin 1000 mg twice daily CBGs*** On Lipitor 40 mg and losartan 50 mg daily Last BMP 06/10/2020, creatinine stable at 1.19 Last hemoglobin A1c 8.2 on 06/10/2020 Last lipid panel 01/11/2020, LDL 55  Hypertension Current regimen: Norvasc 10 mg nightly, losartan 50 mg daily BP at home*** Denies chest pain, shortness of breath, leg edema***  PERTINENT  PMH / PSH: ***  OBJECTIVE:   There were no vitals taken for this visit.  ***  ASSESSMENT/PLAN:   No problem-specific Assessment & Plan notes found for this encounter.     Cleophas Dunker, DO [place]   {    This will disappear when note is signed, click to select method of visit    :1}

## 2020-11-26 ENCOUNTER — Encounter: Payer: Self-pay | Admitting: Gastroenterology

## 2020-11-29 ENCOUNTER — Other Ambulatory Visit: Payer: Self-pay | Admitting: Family Medicine

## 2020-11-29 DIAGNOSIS — I10 Essential (primary) hypertension: Secondary | ICD-10-CM

## 2020-11-29 DIAGNOSIS — I152 Hypertension secondary to endocrine disorders: Secondary | ICD-10-CM

## 2020-11-29 DIAGNOSIS — E1159 Type 2 diabetes mellitus with other circulatory complications: Secondary | ICD-10-CM

## 2020-12-03 NOTE — Telephone Encounter (Signed)
To close telephone encounter 

## 2020-12-13 ENCOUNTER — Other Ambulatory Visit: Payer: Self-pay

## 2020-12-13 ENCOUNTER — Encounter: Payer: Self-pay | Admitting: Family Medicine

## 2020-12-13 ENCOUNTER — Ambulatory Visit (INDEPENDENT_AMBULATORY_CARE_PROVIDER_SITE_OTHER): Payer: Medicare HMO | Admitting: Family Medicine

## 2020-12-13 VITALS — BP 130/68 | HR 91 | Ht 60.0 in | Wt 211.4 lb

## 2020-12-13 DIAGNOSIS — N183 Chronic kidney disease, stage 3 unspecified: Secondary | ICD-10-CM | POA: Diagnosis not present

## 2020-12-13 DIAGNOSIS — F17200 Nicotine dependence, unspecified, uncomplicated: Secondary | ICD-10-CM | POA: Diagnosis not present

## 2020-12-13 DIAGNOSIS — R062 Wheezing: Secondary | ICD-10-CM

## 2020-12-13 DIAGNOSIS — E1122 Type 2 diabetes mellitus with diabetic chronic kidney disease: Secondary | ICD-10-CM

## 2020-12-13 DIAGNOSIS — I10 Essential (primary) hypertension: Secondary | ICD-10-CM

## 2020-12-13 DIAGNOSIS — I129 Hypertensive chronic kidney disease with stage 1 through stage 4 chronic kidney disease, or unspecified chronic kidney disease: Secondary | ICD-10-CM

## 2020-12-13 HISTORY — DX: Wheezing: R06.2

## 2020-12-13 LAB — POCT GLYCOSYLATED HEMOGLOBIN (HGB A1C): HbA1c, POC (controlled diabetic range): 8.7 % — AB (ref 0.0–7.0)

## 2020-12-13 MED ORDER — CHLORTHALIDONE 25 MG PO TABS: 25.0000 mg | ORAL_TABLET | Freq: Every day | ORAL | 3 refills | Status: DC

## 2020-12-13 MED ORDER — METFORMIN HCL ER 500 MG PO TB24: 1000.0000 mg | ORAL_TABLET | Freq: Two times a day (BID) | ORAL | 3 refills | Status: DC

## 2020-12-13 MED ORDER — TERCONAZOLE 0.4 % VA CREA: TOPICAL_CREAM | VAGINAL | 0 refills | Status: DC

## 2020-12-13 MED ORDER — ALBUTEROL SULFATE HFA 108 (90 BASE) MCG/ACT IN AERS: 2.0000 | INHALATION_SPRAY | RESPIRATORY_TRACT | 0 refills | Status: DC | PRN

## 2020-12-13 NOTE — Progress Notes (Signed)
   SUBJECTIVE:  CHIEF COMPLAINT / HPI:   Diabetes Mellitus Type II, Follow-up Last seen for diabetes 6 months ago.  Management since then includes Metformin once daily (due to not feeling well twice day), jardiance, atrovastatin She reports good compliance with treatment. She is having side effects.  Denies fatigue, nausea/vmoiting, appetite changes, paraesthesias, polyuria/polydipsia, visual changes. Denies hypoglycemia with tremulousness, diaphoresis, tachycardia. Reports diabetic foot checks at home.  Current exercise: walking half a block M-F.  Current diet habits: eats a lot of chips, but says she doesn't eat too much over all.   Home blood sugar records: Not everyday, just if feeling symptomatic, but the numbers are usually normal, low 120's.  Episodes of hypoglycemia? No  Most Recent Eye Exam: 08/21/20 Last Diabetic Foot Check: follows with podiatry    Hypertension Current medication: chlorthalidone, losartan. Patient reports that she discontinued amlodipine.  She reports good compliance with treatment. She is not having side effects. She does smoke. Use of agents associated with hypertension: none.  Outside blood pressures are not being checked . Patient denies chest pain or pressure, palpitations, dyspnea, PND, syncope, orthopnea and BLEE.   Wheezing Difficulty breathing  Patient reports that she has had wheezing for the past several weeks and has been taking an inhaler daily. She does report some shortness of breath, but no significant dyspnea on exertion. She also notes some increase in coughing. All of these have concerned her. She also notes increased smoking in the last several weeks as well (see below).   Smoking Cessation  Started using albuterol about once a day for about a month.  Smoking picked back up to 12 cigarettes a day for a month a half. She did well for about 3 months-- got down to 2-4 cigarettes a week.   PERTINENT  PMH / PSH: tobacco dependence, DM II, CKD  III    OBJECTIVE:  BP 130/68   Pulse 91   Ht 5' (1.524 m)   Wt 211 lb 6 oz (95.9 kg)   SpO2 99%   BMI 41.28 kg/m   General: well appearing female, NAD Chest: RRR, no m/r/g Lungs: CTAB. No wheezing appreciated. Good air movement posteriorly   ASSESSMENT/PLAN:  Type 2 DM with CKD stage 3 and hypertension ([facility]) borderline controlled and no significant medication side effects noted -Medications: switching to metformin XL to help with side effects, hopefully will tolerate so that she can take twice daily.    -Encouraged compliance with medications, and adherence to diet and exercise recommendations. -Follow-up in 3 months and bring medications to next appointment.  Hypertension Patient reports that she stopped taking the amlodipine months ago. Has been taking chlorthalidone and losartan. BP 130/68 today. BMP today for renal function.  -Medications: No changes -Encouraged patient to monitor blood pressures at home, compliance with medications, and adherence to diet and exercise recommendations.  Tobacco dependence Reports that gum works the best, but she does not need any refills today.  Set up appt with Dr. Valentina Lucks for tobacco cessation and PFT.   Wheezing Likely due to smoking, especially with increased number of cigarettes smoked daily. No wheezing on exam today, no SOB appreciated. Her last albuterol use was this morning. Given increased coughing, wheezing, and subjective SOB, will schedule for PFT with Dr. Valentina Lucks as well as tobacco cessation.     Wilber Oliphant, MD [place]

## 2020-12-13 NOTE — Assessment & Plan Note (Signed)
Reports that gum works the best, but she does not need any refills today.  Set up appt with Dr. Valentina Lucks for tobacco cessation and PFT.

## 2020-12-13 NOTE — Assessment & Plan Note (Addendum)
Patient reports that she stopped taking the amlodipine months ago. Has been taking chlorthalidone and losartan. BP 130/68 today. BMP today for renal function.  -Medications: No changes -Encouraged patient to monitor blood pressures at home, compliance with medications, and adherence to diet and exercise recommendations.

## 2020-12-13 NOTE — Assessment & Plan Note (Signed)
borderline controlled and no significant medication side effects noted -Medications: switching to metformin XL to help with side effects, hopefully will tolerate so that she can take twice daily.    -Encouraged compliance with medications, and adherence to diet and exercise recommendations. -Follow-up in 3 months and bring medications to next appointment.

## 2020-12-13 NOTE — Assessment & Plan Note (Signed)
Likely due to smoking, especially with increased number of cigarettes smoked daily. No wheezing on exam today, no SOB appreciated. Her last albuterol use was this morning. Given increased coughing, wheezing, and subjective SOB, will schedule for PFT with Dr. Valentina Lucks as well as tobacco cessation.

## 2020-12-13 NOTE — Patient Instructions (Addendum)
Smoking  - referring you to Dr. Valentina Lucks for smoking cessation and for pulmonary function testing  - refilled you albuterol   Diabetes  - your a1c is 8.7 today. Our goal is under 8. Try doing some more exercise.  - switching over to Metformin XR 1000 mg twice a day

## 2020-12-13 NOTE — Assessment & Plan Note (Signed)
>>  ASSESSMENT AND PLAN FOR TOBACCO DEPENDENCE WRITTEN ON 12/13/2020  3:36 PM BY KIM, RACHEL E, MD  Reports that gum works the best, but she does not need any refills today.  Set up appt with Dr. Koval for tobacco cessation and PFT.

## 2020-12-14 LAB — BASIC METABOLIC PANEL
BUN/Creatinine Ratio: 21 (ref 12–28)
BUN: 23 mg/dL (ref 8–27)
CO2: 23 mmol/L (ref 20–29)
Calcium: 9.8 mg/dL (ref 8.7–10.3)
Chloride: 99 mmol/L (ref 96–106)
Creatinine, Ser: 1.09 mg/dL — ABNORMAL HIGH (ref 0.57–1.00)
Glucose: 120 mg/dL — ABNORMAL HIGH (ref 65–99)
Potassium: 3.8 mmol/L (ref 3.5–5.2)
Sodium: 139 mmol/L (ref 134–144)
eGFR: 57 mL/min/{1.73_m2} — ABNORMAL LOW (ref >59)

## 2020-12-14 LAB — LIPID PANEL
Chol/HDL Ratio: 2 ratio (ref 0.0–4.4)
Cholesterol, Total: 121 mg/dL (ref 100–199)
HDL: 61 mg/dL (ref >39)
LDL Chol Calc (NIH): 40 mg/dL (ref 0–99)
Triglycerides: 114 mg/dL (ref 0–149)
VLDL Cholesterol Cal: 20 mg/dL (ref 5–40)

## 2020-12-16 ENCOUNTER — Encounter: Payer: Self-pay | Admitting: Family Medicine

## 2020-12-17 ENCOUNTER — Ambulatory Visit (AMBULATORY_SURGERY_CENTER): Payer: Self-pay

## 2020-12-17 ENCOUNTER — Other Ambulatory Visit: Payer: Self-pay

## 2020-12-17 VITALS — Ht 60.0 in | Wt 212.0 lb

## 2020-12-17 DIAGNOSIS — Z1211 Encounter for screening for malignant neoplasm of colon: Secondary | ICD-10-CM

## 2020-12-17 MED ORDER — NA SULFATE-K SULFATE-MG SULF 17.5-3.13-1.6 GM/177ML PO SOLN: 1.0000 | Freq: Once | ORAL | 0 refills | Status: AC

## 2020-12-17 NOTE — Progress Notes (Signed)
No allergies to soy or egg Pt is not on blood thinners or diet pills Denies issues with sedation/intubation Denies atrial flutter/fib Denies constipation   Emmi instructions given to pt  Pt is aware of Covid safety and care partner requirements.   Is having coughing and wheezing--is due to have a pulmonary appt 4/7 for evaluation.  Pt will call to update [facility] of any diagnosis that she may receive.  Pt did not display SOB, wheezing or difficulty carrying on a conversation during PV.  Has hx of smoking.

## 2020-12-19 ENCOUNTER — Ambulatory Visit (INDEPENDENT_AMBULATORY_CARE_PROVIDER_SITE_OTHER): Payer: Medicare HMO | Admitting: Pharmacist

## 2020-12-19 ENCOUNTER — Other Ambulatory Visit: Payer: Self-pay

## 2020-12-19 ENCOUNTER — Encounter: Payer: Self-pay | Admitting: Pharmacist

## 2020-12-19 ENCOUNTER — Telehealth: Payer: Self-pay | Admitting: Gastroenterology

## 2020-12-19 VITALS — HR 76 | Ht 60.0 in | Wt 215.0 lb

## 2020-12-19 DIAGNOSIS — F17200 Nicotine dependence, unspecified, uncomplicated: Secondary | ICD-10-CM | POA: Diagnosis not present

## 2020-12-19 DIAGNOSIS — R062 Wheezing: Secondary | ICD-10-CM

## 2020-12-19 MED ORDER — VARENICLINE TARTRATE 0.5 MG PO TABS: 0.5000 mg | ORAL_TABLET | Freq: Two times a day (BID) | ORAL | 1 refills | Status: DC

## 2020-12-19 NOTE — Assessment & Plan Note (Signed)
Tobacco use disorder with severe nicotine dependence of 50 years duration in a patient who is good candidate for success because of goal to improve overall health and breathing.    -Initiated varenicline 0.5 mg by mouth once daily with food.  -Patient counseled on purpose, proper use, and potential adverse effects, including GI upset. -Provided information on 1 800-QUIT NOW support program.

## 2020-12-19 NOTE — Telephone Encounter (Signed)
Inbound call from patient. States she was giving a new medication at her Dr.and was told to let pre visit know before procedure. New medication is called Varenicline 0.5MG 

## 2020-12-19 NOTE — Assessment & Plan Note (Signed)
Patient has been experiencing SOB multiple times daily especially with exertion and taking albuterol once daily without significant relief. 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 Post nebulized albuterol tx revealed possible restriction with no significant pre-post change indicative of non-reversible obstructive lung disease. -Reviewed results of pulmonary function tests.  Pt verbalized understanding of results and education. -Discussed importance of smoking cessation. -Consider LAMA inhalation therapy at upcoming visit.   Tobacco use disorder with severe nicotine dependence of 50 years duration in a patient who is good candidate for success because of goal to improve overall health and breathing.    -Initiated varenicline 0.5 mg by mouth once daily with food.  -Patient counseled on purpose, proper use, and potential adverse effects, including GI upset. -Provided information on 1 800-QUIT NOW support program.

## 2020-12-19 NOTE — Patient Instructions (Addendum)
Nice meeting you today!  Start varenicline 0.5 mg by mouth once daily with food.   Will give you a call in 1 week!

## 2020-12-19 NOTE — Assessment & Plan Note (Signed)
>>  ASSESSMENT AND PLAN FOR TOBACCO DEPENDENCE WRITTEN ON 12/19/2020  3:19 PM BY KOVAL, PETER G, RPH-CPP  Tobacco use disorder with severe nicotine  dependence of 50 years duration in a patient who is good candidate for success because of goal to improve overall health and breathing.    -Initiated varenicline  0.5 mg by mouth once daily with food.  -Patient counseled on purpose, proper use, and potential adverse effects, including GI upset. -Provided information on 1 800-QUIT NOW support program.

## 2020-12-19 NOTE — Progress Notes (Signed)
S:   Patient arrives in good spirits for evaluation/assistance with tobacco dependence.   Patient was referred and last seen by Primary Care Provider on 12/13/20.  Today, patient reports smoking 1 ppd since she was 65 years old. Reports decreasing to 12 cigs/week for 1.5 months, but now up to 0.5 ppd for the last 3 months due to increased stressed. Reports history of 4-5 quit attempts, however only successful for 12 days in 2021 with help of nicotine gum. Reports discontinuing nicotine gum ~ 1 month ago due to stomach irritation. Reports previously used bupropion and nicotine patch, lozenges, and inhaler, however caused severe diarrhea (~12x/day). Reports using Chantix for 3 days about ~20 years ago but discontinued due to vivid dreams. Reports smoking only in her room, but not in the house or car. Additionally, patient rates breathing a 6 out of 10 and reports SOB 5-6 times daily especially with exertion. 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 Reports using albuterol inhaler once daily (last used 7:30AM today) and reports it helps "a little bit".   Age when started using tobacco on a daily basis at 65 years old  Brand smoked Kools filtered and newports. Number of cigarettes/day = 10. Estimated nicotine content per cigarette (mg): 1  Estimated nicotine intake per day 10 mg.   Smokes multiple times per night.    Fagerstrom Score Question Scoring Patient Score  How soon after waking do you smoke your first cigarette? <5 mins (3) 5-30 mins (2) 31-60 mins (1) >60 mins (0) 3  Do you find it difficult NOT to smoke in places where you shouldn't? Yes(1) No (0) 1  Which cigarette would you most hate to give up? First one in AM (1)  Any other one (0) 1     How many cigarettes do you smoke/day? 10 or less (0) 11-20 (1) 21-30 (2) >30 (3) 1  Do you smoke more during the first few hours after waking? Yes (1) No (0) 1  Do you smoke if you  are so ill you cannot get out of bed? Yes (1) No (0) 0   Total Score 7  Score interpretation: low 1-2, low-to-moderate 3-4, moderate 5-7, high >7  Most recent quit attempt: between September and October 2021 Longest time ever been tobacco free: 12 days  Medications used in past cessation efforts include: NRT (patch, lozenge, gum, inhaler), bupropion, varenicline  Rates IMPORTANCE of quitting tobacco on 1-10 scale of 9. Rates CONFIDENCE of quitting tobacco on 1-10 scale of 6.  Most common triggers to use tobacco include: upset, stress, drinking, after meals   Motivation to quit: Health  Clinical ASCVD: No  The ASCVD Risk score Mikey Bussing DC Jr., et al., 2013) failed to calculate for the following reasons:   The valid total cholesterol range is 130 to 320 mg/dL  See "scanned report" or Documentation Flowsheet (discrete results - PFTs) for Spirometry results. Patient provided good effort while attempting spirometry.   Lung Age = 9 Albuterol Neb  Lot# [id number]     Exp. 05/14/22  A/P: Patient has been experiencing SOB multiple times daily especially with exertion and taking albuterol once daily without significant relief. 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 Post nebulized albuterol tx revealed possible restriction with no significant pre-post change indicative of non-reversible obstructive lung disease. -Reviewed results of pulmonary function tests.  Pt verbalized understanding of results and education. -Discussed importance of smoking cessation. -Consider LAMA inhalation  therapy at upcoming visit.   Tobacco use disorder with severe nicotine dependence of 50 years duration in a patient who is good candidate for success because of goal to improve overall health and breathing.    -Initiated varenicline 0.5 mg by mouth once daily with food.  -Patient counseled on purpose, proper use, and potential adverse effects, including GI upset. -Provided information on 1  800-QUIT NOW support program.   Written information provided.  F/U phone call in 1 week.  Total time in face-to-face counseling 40 minutes.  Patient seen with Inis Sizer, PharmD Candidate and Lorel Monaco, PharmD, BCPS - PGY2 Pharmacy Resident.

## 2020-12-19 NOTE — Telephone Encounter (Signed)
New med noted, its for smoking cessaton.

## 2020-12-20 ENCOUNTER — Encounter: Payer: Self-pay | Admitting: Family Medicine

## 2020-12-20 NOTE — Progress Notes (Signed)
Reviewed: I agree with Dr. Koval's documentation and management. 

## 2020-12-26 ENCOUNTER — Telehealth: Payer: Self-pay | Admitting: Pharmacist

## 2020-12-26 ENCOUNTER — Telehealth: Payer: Self-pay

## 2020-12-26 DIAGNOSIS — N393 Stress incontinence (female) (male): Secondary | ICD-10-CM

## 2020-12-26 NOTE — Telephone Encounter (Signed)
Contacted patient regarding smoking cessation progress. Patient reports currently smoking 0.5 to 1 ppd. Reports starting Chantix 0.5 mg BID this Monday (4/11). Currently denies GI issues or vivid dreams. Patient requested two week follow-up to assess efficacy of Chantix. Follow-up called scheduled in two weeks.

## 2020-12-26 NOTE — Telephone Encounter (Signed)
Patient calls nurse line stating she no longer wants to use her current DME company for incontinence supplies. Patient was unable to tell me who she uses now, I am assuming [employer] or [employer]. Patient stated she called her insurance company and they provided her with a company called [employer]. Patient states they just need an order for "regular length max absorbency pads," faxed to [phone].

## 2020-12-30 ENCOUNTER — Inpatient Hospital Stay: Admission: RE | Admit: 2020-12-30 | Payer: Medicare HMO | Source: Ambulatory Visit

## 2020-12-31 ENCOUNTER — Ambulatory Visit (AMBULATORY_SURGERY_CENTER): Payer: Medicare HMO | Admitting: Gastroenterology

## 2020-12-31 ENCOUNTER — Other Ambulatory Visit: Payer: Self-pay

## 2020-12-31 ENCOUNTER — Encounter: Payer: Self-pay | Admitting: Gastroenterology

## 2020-12-31 VITALS — BP 127/67 | HR 80 | Temp 97.9°F | Resp 14 | Ht 60.0 in | Wt 212.0 lb

## 2020-12-31 DIAGNOSIS — D124 Benign neoplasm of descending colon: Secondary | ICD-10-CM | POA: Diagnosis not present

## 2020-12-31 DIAGNOSIS — D123 Benign neoplasm of transverse colon: Secondary | ICD-10-CM | POA: Diagnosis not present

## 2020-12-31 DIAGNOSIS — D3615 Benign neoplasm of peripheral nerves and autonomic nervous system of abdomen: Secondary | ICD-10-CM | POA: Diagnosis not present

## 2020-12-31 DIAGNOSIS — Z1211 Encounter for screening for malignant neoplasm of colon: Secondary | ICD-10-CM

## 2020-12-31 MED ORDER — SODIUM CHLORIDE 0.9 % IV SOLN: 500.0000 mL | Freq: Once | INTRAVENOUS | Status: DC

## 2020-12-31 NOTE — Progress Notes (Signed)
Called to room to assist during endoscopic procedure.  Patient ID and intended procedure confirmed with present staff. Received instructions for my participation in the procedure from the performing physician.  

## 2020-12-31 NOTE — Progress Notes (Signed)
To PACU, VSs. Report to [email or web address]

## 2020-12-31 NOTE — Progress Notes (Signed)
Pt's states no medical or surgical changes since previsit or office visit.  CW - vitals 

## 2020-12-31 NOTE — Patient Instructions (Signed)
Read all handouts given to  You by your recovery room nurse.  YOU HAD AN ENDOSCOPIC PROCEDURE TODAY AT [facility]:   Refer to the procedure report that was given to you for any specific questions about what was found during the examination.  If the procedure report does not answer your questions, please call your gastroenterologist to clarify.  If you requested that your care partner not be given the details of your procedure findings, then the procedure report has been included in a sealed envelope for you to review at your convenience later.  YOU SHOULD EXPECT: Some feelings of bloating in the abdomen. Passage of more gas than usual.  Walking can help get rid of the air that was put into your GI tract during the procedure and reduce the bloating. If you had a lower endoscopy (such as a colonoscopy or flexible sigmoidoscopy) you may notice spotting of blood in your stool or on the toilet paper. If you underwent a bowel prep for your procedure, you may not have a normal bowel movement for a few days.  Please Note:  You might notice some irritation and congestion in your nose or some drainage.  This is from the oxygen used during your procedure.  There is no need for concern and it should clear up in a day or so.  SYMPTOMS TO REPORT IMMEDIATELY:   Following lower endoscopy (colonoscopy or flexible sigmoidoscopy):  Excessive amounts of blood in the stool  Significant tenderness or worsening of abdominal pains  Swelling of the abdomen that is new, acute  Fever of 100F or higher   Black, tarry-looking stools  For urgent or emergent issues, a gastroenterologist can be reached at any hour by calling [phone]. Do not use MyChart messaging for urgent concerns.    DIET:  We do recommend a small meal at first, but then you may proceed to your regular diet.  Drink plenty of fluids but you should avoid alcoholic beverages for 24 hours. Try to increase the fiber I your diet, and  drink plenty of water.  ACTIVITY:  You should plan to take it easy for the rest of today and you should NOT DRIVE or use heavy machinery until tomorrow (because of the sedation medicines used during the test).    FOLLOW UP: Our staff will call the number listed on your records 48-72 hours following your procedure to check on you and address any questions or concerns that you may have regarding the information given to you following your procedure. If we do not reach you, we will leave a message.  We will attempt to reach you two times.  During this call, we will ask if you have developed any symptoms of COVID 19. If you develop any symptoms (ie: fever, flu-like symptoms, shortness of breath, cough etc.) before then, please call [phone].  If you test positive for Covid 19 in the 2 weeks post procedure, please call and report this information to [place].    If any biopsies were taken you will be contacted by phone or by letter within the next 1-3 weeks.  Please call us at [phone] if you have not heard about the biopsies in 3 weeks.    SIGNATURES/CONFIDENTIALITY: You and/or your care partner have signed paperwork which will be entered into your electronic medical record.  These signatures attest to the fact that that the information above on your After Visit Summary has been reviewed and is understood.  Full responsibility of  the confidentiality of this discharge information lies with you and/or your care-partner. 

## 2020-12-31 NOTE — Op Note (Signed)
[facility] Patient Name: Alexandra Campos Procedure Date: 12/31/2020 3:34 PM MRN: [id number] Endoscopist: Ladene Artist , MD Age: 65 Referring MD:  Date of Birth: 20-Oct-1955 Gender: Female Account #: [id number] Procedure:                Colonoscopy Indications:              Screening for colorectal malignant neoplasm Medicines:                Monitored Anesthesia Care Procedure:                Pre-Anesthesia Assessment:                           - Prior to the procedure, a History and Physical                            was performed, and patient medications and                            allergies were reviewed. The patient's tolerance of                            previous anesthesia was also reviewed. The risks                            and benefits of the procedure and the sedation                            options and risks were discussed with the patient.                            All questions were answered, and informed consent                            was obtained. Prior Anticoagulants: The patient has                            taken no previous anticoagulant or antiplatelet                            agents. ASA Grade Assessment: III - A patient with                            severe systemic disease. After reviewing the risks                            and benefits, the patient was deemed in                            satisfactory condition to undergo the procedure.                           After obtaining informed consent, the colonoscope  was passed under direct vision. Throughout the                            procedure, the patient's blood pressure, pulse, and                            oxygen saturations were monitored continuously. The                            Olympus CF-HQ190L [id number]) Colonoscope was                            introduced through the anus and advanced to the the                            cecum, identified  by appendiceal orifice and                            ileocecal valve. The ileocecal valve, appendiceal                            orifice, and rectum were photographed. The quality                            of the bowel preparation was good. The colonoscopy                            was performed without difficulty. The patient                            tolerated the procedure well. Scope In: 3:41:58 PM Scope Out: 3:56:28 PM Scope Withdrawal Time: 0 hours 13 minutes 5 seconds  Total Procedure Duration: 0 hours 14 minutes 30 seconds  Findings:                 The perianal and digital rectal examinations were                            normal.                           Three sessile polyps were found in the descending                            colon and transverse colon. The polyps were 5 to 8                            mm in size. These polyps were removed with a cold                            snare. Resection and retrieval were complete.                           Multiple small-mouthed diverticula were found in  the left colon. There was evidence of diverticular                            spasm. Peri-diverticular erythema was seen. There                            was no evidence of diverticular bleeding.                           Internal hemorrhoids were found during                            retroflexion. The hemorrhoids were small and Grade                            I (internal hemorrhoids that do not prolapse).                           The exam was otherwise without abnormality on                            direct and retroflexion views. Complications:            No immediate complications. Estimated blood loss:                            None. Estimated Blood Loss:     Estimated blood loss: none. Impression:               - Three 5 to 8 mm polyps in the descending colon                            and in the transverse colon, removed with a cold                             snare. Resected and retrieved.                           - Mild diverticulosis in the left colon.                           - Internal hemorrhoids.                           - The examination was otherwise normal on direct                            and retroflexion views. Recommendation:           - Repeat colonoscopy after studies are complete for                            surveillance based on pathology results.                           - Patient has a contact number available for  emergencies. The signs and symptoms of potential                            delayed complications were discussed with the                            patient. Return to normal activities tomorrow.                            Written discharge instructions were provided to the                            patient.                           - High fiber diet.                           - Continue present medications.                           - Await pathology results. Ladene Artist, MD 12/31/2020 4:30:58 PM This report has been signed electronically.

## 2021-01-02 ENCOUNTER — Telehealth: Payer: Self-pay | Admitting: *Deleted

## 2021-01-02 NOTE — Telephone Encounter (Signed)
Faxed as requested.  Christen Bame, CMA

## 2021-01-02 NOTE — Telephone Encounter (Signed)
  Follow up Call-  Call back number 12/31/2020  Post procedure Call Back phone  # [phone]  Permission to leave phone message Yes  Some recent data might be hidden     Patient questions:  Do you have a fever, pain , or abdominal swelling? No. Pain Score  0   Have you tolerated food without any problems? Yes  Have you been able to return to your normal activities? Yes.    Do you have any questions about your discharge instructions: Diet   No. Medications  No. Follow up visit  No.  Do you have questions or concerns about your Care? No.  Actions: * If pain score is 4 or above:  1. Have you developed a fever since your procedure? no  2.   Have you had an respiratory symptoms (SOB or cough) since your procedure?no  3.   Have you tested positive for COVID 19 since your procedure no  4.   Have you had any family members/close contacts diagnosed with the COVID 19 since your procedure? no   If yes to any of these questions please route to Joylene John, RN and Joella Prince, RN

## 2021-01-02 NOTE — Telephone Encounter (Signed)
Hey Dr. Maudie Mercury,  [facility] is out of the office this week.  If you can place that order, I will be happy to fax it!  Christen Bame, CMA

## 2021-01-06 ENCOUNTER — Telehealth: Payer: Self-pay | Admitting: Pharmacist

## 2021-01-06 DIAGNOSIS — F17200 Nicotine dependence, unspecified, uncomplicated: Secondary | ICD-10-CM

## 2021-01-06 MED ORDER — VARENICLINE TARTRATE 1 MG PO TABS: 1.0000 mg | ORAL_TABLET | Freq: Two times a day (BID) | ORAL | 4 refills | Status: DC

## 2021-01-06 NOTE — Assessment & Plan Note (Signed)
>>  ASSESSMENT AND PLAN FOR TOBACCO DEPENDENCE WRITTEN ON 01/06/2021  4:47 PM BY KOVAL, PETER G, RPH-CPP  Patient calls reporting lack of success with tapering or reducing tobacco intake using Varenicline  0.5mg  BID.  She also noted no increase in GI side effects of diarrhea.   Phone call returned 4/25 due to my absence from the patient care area for several days.   Patient states she tried higher doses of 2 X 0.5mg  BID (1 mg BID) for the last few days without any significant increase in any GI side effects.   She agreed with plan to use current supply as currently taking and then change to  1mg  tablets taken BID with food.  New prescription sent to pharmacy AND phone follow-up in 1 week.    Patient verbalized understanding to use current supply with current dosing and then continue 1mg  BID with higher strength pills prescribed today.

## 2021-01-06 NOTE — Telephone Encounter (Signed)
Patient calls reporting lack of success with tapering or reducing tobacco intake using Varenicline 0.5mg  BID.  She also noted no increase in GI side effects of diarrhea.   Phone call returned 4/25 due to my absence from the patient care area for several days.   Patient states she tried higher doses of 2 X 0.5mg  BID (1 mg BID) for the last few days without any significant increase in any GI side effects.   She agreed with plan to use current supply as currently taking and then change to  1mg  tablets taken BID with food.  New prescription sent to pharmacy AND phone follow-up in 1 week.    Patient verbalized understanding to use current supply with current dosing and then continue 1mg  BID with higher strength pills prescribed today.

## 2021-01-06 NOTE — Assessment & Plan Note (Signed)
Patient calls reporting lack of success with tapering or reducing tobacco intake using Varenicline 0.5mg BID.  She also noted no increase in GI side effects of diarrhea.   Phone call returned 4/25 due to my absence from the patient care area for several days.   Patient states she tried higher doses of 2 X 0.5mg BID (1 mg BID) for the last few days without any significant increase in any GI side effects.   She agreed with plan to use current supply as currently taking and then change to  1mg tablets taken BID with food.  New prescription sent to pharmacy AND phone follow-up in 1 week.    Patient verbalized understanding to use current supply with current dosing and then continue 1mg BID with higher strength pills prescribed today.    

## 2021-01-08 NOTE — Telephone Encounter (Signed)
Called patient to let her know I received a call from [facility] and they don't carry the absorbency liners/pads for patients.  They only carry  Briefs, pullups, gloves and underpads/chuck pads.  Patient voiced understanding and will call us back once she receives the name of another company from her medicaid rep.  Jordane Hisle,CMA

## 2021-01-13 ENCOUNTER — Encounter: Payer: Self-pay | Admitting: Gastroenterology

## 2021-01-13 NOTE — Telephone Encounter (Signed)
Noted and agree. 

## 2021-01-14 ENCOUNTER — Telehealth: Payer: Self-pay | Admitting: Pharmacist

## 2021-01-14 MED ORDER — VARENICLINE TARTRATE 1 MG PO TABS: 1.0000 mg | ORAL_TABLET | Freq: Two times a day (BID) | ORAL | 4 refills | Status: DC

## 2021-01-14 NOTE — Telephone Encounter (Signed)
-----   Message from Leavy Cella, [place] sent at 01/06/2021  4:47 PM EDT ----- Regarding: Tobacco Cessation - intake reduction with Chantix 1mg  BID

## 2021-01-14 NOTE — Telephone Encounter (Signed)
Contacted patient Re tobacco intake reduction/cessation.   Patient reports intake at 13 cigs per day.  Goal in the next two weeks < 5.    Patient currently taking 0.5mg  once daily.  Considering to increase to BID with food in the next few days.  Plans to increase to 1mg  BID - new prescription sent to her pharmacy.   Plan phone follow-up in 2 weeks.

## 2021-01-14 NOTE — Telephone Encounter (Signed)
Noted and agree. 

## 2021-01-15 ENCOUNTER — Ambulatory Visit (HOSPITAL_COMMUNITY)
Admission: RE | Admit: 2021-01-15 | Discharge: 2021-01-15 | Disposition: A | Discharge status: Home / Self Care | Payer: Medicare HMO | Source: Ambulatory Visit | Attending: Vascular Surgery | Admitting: Vascular Surgery

## 2021-01-15 ENCOUNTER — Other Ambulatory Visit: Payer: Self-pay

## 2021-01-15 ENCOUNTER — Ambulatory Visit (INDEPENDENT_AMBULATORY_CARE_PROVIDER_SITE_OTHER): Payer: Medicare HMO | Admitting: Vascular Surgery

## 2021-01-15 ENCOUNTER — Encounter: Payer: Self-pay | Admitting: Vascular Surgery

## 2021-01-15 VITALS — BP 158/87 | HR 74 | Temp 98.1°F | Resp 20 | Ht 60.0 in | Wt 212.0 lb

## 2021-01-15 DIAGNOSIS — Z6841 Body Mass Index (BMI) 40.0 and over, adult: Secondary | ICD-10-CM | POA: Diagnosis not present

## 2021-01-15 DIAGNOSIS — I70219 Atherosclerosis of native arteries of extremities with intermittent claudication, unspecified extremity: Secondary | ICD-10-CM | POA: Diagnosis not present

## 2021-01-15 DIAGNOSIS — E1151 Type 2 diabetes mellitus with diabetic peripheral angiopathy without gangrene: Secondary | ICD-10-CM | POA: Diagnosis not present

## 2021-01-15 DIAGNOSIS — I70229 Atherosclerosis of native arteries of extremities with rest pain, unspecified extremity: Secondary | ICD-10-CM

## 2021-01-15 NOTE — Progress Notes (Signed)
REASON FOR VISIT:   Follow-up of peripheral vascular disease.  MEDICAL ISSUES:   PERIPHERAL VASCULAR DISEASE: This patient has infrainguinal arterial occlusive disease bilaterally.  She had an arteriogram last year there was no significant inflow disease.  Her symptoms have progressed some although she has no significant rest pain or nonhealing ulcers.  We have again discussed the importance of walking and I have encouraged her to try to get off the cigarettes completely.  If her symptoms progress again her only option would be staged bilateral femoral endarterectomies and femoral to below-knee popliteal artery bypass graft likely with a prosthetic graft.  She would certainly be at high risk for infection given her diabetes and obesity (BMI = 41).  I have ordered a CT angiogram with runoff in 6 months and I will see her back at that time.  She knows to call sooner if she has problems.   HPI:   Alexandra Campos is a pleasant 65 y.o. female who had presented in June of last year with bilateral lower extremity pain.  She underwent an arteriogram on 03/15/2020.  The results are discussed below.  In short, she would require bilateral femoral endarterectomies and a femoral to below-knee popliteal artery bypass bilaterally.  Unfortunately her vein map showed that her veins were quite small and I was reluctant to consider a prosthetic bypass given her obesity and risk of wound infection.  Since I saw her last she continues to have significant claudication bilaterally which is more significant on the right side.  She experiences pain behind her knee especially which is somewhat unusual that is brought on by ambulation and relieved somewhat with rest.  She had cut back to about 8 cigarettes a week but has now increased back to a pack per day although she just started Chantix recently and has been trying to get off cigarettes.  She denies any history of rest pain or nonhealing ulcers.  She does have some  neuropathy in her feet.  She does have type 2 diabetes and hypertension.  Her last cholesterol numbers look pretty good.  She is on a statin.  Past Medical History:  Diagnosis Date  . Arthritis   . Diabetes mellitus without complication ([facility])   . GERD (gastroesophageal reflux disease)   . Hyperlipidemia   . Hypertension     Family History  Problem Relation Age of Onset  . Diabetes Mother   . Hypertension Mother   . Diabetes Father   . Hypertension Father   . Cancer Father   . Diabetes Brother   . Hypertension Brother   . Colon cancer Brother 40  . Diabetes Daughter   . Hypertension Daughter   . Cancer Paternal Uncle   . Diabetes Maternal Grandmother   . Hypertension Maternal Grandmother   . Colon polyps Neg Hx   . Esophageal cancer Neg Hx   . Rectal cancer Neg Hx   . Stomach cancer Neg Hx     SOCIAL HISTORY: Social History   Tobacco Use  . Smoking status: Current Every Day Smoker    Packs/day: 1.00    Types: Cigarettes  . Smokeless tobacco: Never Used  Substance Use Topics  . Alcohol use: Yes    Alcohol/week: 5.0 standard drinks    Types: 5 Cans of beer per week    Comment: 5 drinks/week     No Known Allergies  Current Outpatient Medications  Medication Sig Dispense Refill  . Acetaminophen (TYLENOL PO) Take by mouth.    Marland Kitchen  albuterol (VENTOLIN HFA) 108 (90 Base) MCG/ACT inhaler Inhale 2 puffs into the lungs every 4 (four) hours as needed for wheezing or shortness of breath. 1 each 0  . atorvastatin (LIPITOR) 40 MG tablet Take 1 tablet by mouth once daily 90 tablet 3  . blood glucose meter kit and supplies KIT 1 each by Does not apply route daily. Dispense based on patient and insurance preference. Use up to four times daily as directed. (FOR ICD-9 250.00, 250.01). 1 each 0  . cetirizine (ZYRTEC) 10 MG tablet Take 1 tablet (10 mg total) by mouth daily. 30 tablet 11  . chlorthalidone (HYGROTON) 25 MG tablet Take 1 tablet (25 mg total) by mouth daily. 90 tablet 3   . diclofenac Sodium (VOLTAREN) 1 % GEL APPLY 4 GRAMS  TOPICALLY 4 TIMES DAILY FOR 14 DAYS 200 g 0  . glucose blood (ACCU-CHEK GUIDE) test strip Please use to check blood sugar twice daily. 100 each 12  . JARDIANCE 10 MG TABS tablet Take 10 mg by mouth daily.    . Lancets Thin MISC 1 each by Does not apply route as needed. 100 each 0  . losartan (COZAAR) 50 MG tablet Take 1 tablet by mouth once daily 90 tablet 0  . metFORMIN (GLUCOPHAGE-XR) 500 MG 24 hr tablet Take 2 tablets (1,000 mg total) by mouth in the morning and at bedtime. 360 tablet 3  . Multiple Vitamin (MULTIVITAMIN PO) Take by mouth.    Marland Kitchen omeprazole (PRILOSEC) 20 MG capsule Take 1 capsule by mouth once daily 90 capsule 0  . terconazole (TERAZOL 7) 0.4 % vaginal cream INSERT 1 APPLICATORFUL VAGINALLY  AT BEDTIME FOR 7 DAYS 45 g 0  . varenicline (CHANTIX) 1 MG tablet Take 1 tablet (1 mg total) by mouth 2 (two) times daily. 60 tablet 4   No current facility-administered medications for this visit.    REVIEW OF SYSTEMS:  '[X]'  denotes positive finding, '[ ]'  denotes negative finding Cardiac  Comments:  Chest pain or chest pressure:    Shortness of breath upon exertion:    Short of breath when lying flat:    Irregular heart rhythm:        Vascular    Pain in calf, thigh, or hip brought on by ambulation: x   Pain in feet at night that wakes you up from your sleep:     Blood clot in your veins:    Leg swelling:         Pulmonary    Oxygen at home:    Productive cough:     Wheezing:         Neurologic    Sudden weakness in arms or legs:     Sudden numbness in arms or legs:     Sudden onset of difficulty speaking or slurred speech:    Temporary loss of vision in one eye:     Problems with dizziness:         Gastrointestinal    Blood in stool:     Vomited blood:         Genitourinary    Burning when urinating:     Blood in urine:        Psychiatric    Major depression:         Hematologic    Bleeding problems:     Problems with blood clotting too easily:        Skin    Rashes or ulcers:  Constitutional    Fever or chills:     PHYSICAL EXAM:   Vitals:   01/15/21 1328  BP: (!) 158/87  Pulse: 74  Resp: 20  Temp: 98.1 F (36.7 C)  SpO2: 96%  Weight: 212 lb (96.2 kg)  Height: 5' (1.524 m)   Body mass index is 41.4 kg/m.  GENERAL: The patient is a well-nourished female, in no acute distress. The vital signs are documented above. CARDIAC: There is a regular rate and rhythm.  VASCULAR: I do not detect carotid bruits. She does have palpable femoral pulses bilaterally. I cannot palpate pedal pulses. PULMONARY: There is good air exchange bilaterally without wheezing or rales. ABDOMEN: Soft and non-tender with normal pitched bowel sounds.  MUSCULOSKELETAL: There are no major deformities or cyanosis. NEUROLOGIC: No focal weakness or paresthesias are detected. SKIN: There are no ulcers or rashes noted. PSYCHIATRIC: The patient has a normal affect.  DATA:    ARTERIAL DOPPLER STUDY: I have independently interpreted her arterial Doppler study today.  On the right side, there is a monophasic dorsalis pedis and posterior tibial signal.  ABIs 55%.  Toe pressures 29 mmHg.  On the left side there is a monophasic dorsalis pedis and posterior tibial signal.  ABI is 83%.  Toe pressure is 61 mmHg.  AORTOGRAM WITH RUNOFF: Reviewed her aortogram with runoff that was done on 03/15/2020.  The infrarenal aorta, bilateral common iliac arteries, and bilateral external iliac arteries were patent.  Thus there was no significant inflow disease.  On the right side there was extensive plaque in the common femoral artery.  The deep femoral artery was patent.  The superficial femoral artery was occluded at its origin with reconstitution of the popliteal artery at the level of the knee.  The below-knee popliteal artery was patent with three-vessel runoff.  On the left side, the common femoral artery has extensive  plaque posteriorly.  The deep femoral artery was patent.  The superficial femoral artery was occluded at its origin with reconstitution of the popliteal artery at the level of the knee.  The below-knee popliteal artery was patent.  There was disease in the proximal anterior tibial artery.  She had small diffuse disease throughout her tibials.  LABS: I reviewed her labs from 12/13/2020.  Her GFR was 57.  Creatinine 1.09.  LDL cholesterol was 40.  Total cholesterol 121.  [name] [facility] [phone]

## 2021-01-16 ENCOUNTER — Other Ambulatory Visit: Payer: Self-pay

## 2021-01-16 ENCOUNTER — Telehealth: Payer: Self-pay

## 2021-01-16 DIAGNOSIS — I70219 Atherosclerosis of native arteries of extremities with intermittent claudication, unspecified extremity: Secondary | ICD-10-CM

## 2021-01-16 DIAGNOSIS — I70229 Atherosclerosis of native arteries of extremities with rest pain, unspecified extremity: Secondary | ICD-10-CM

## 2021-01-16 NOTE — Telephone Encounter (Signed)
Patient calls nurse line requesting DME rollator walker with seat. Please route message back to RN team once order has been placed for further processing.   Talbot Grumbling, RN

## 2021-01-17 DIAGNOSIS — I70219 Atherosclerosis of native arteries of extremities with intermittent claudication, unspecified extremity: Secondary | ICD-10-CM | POA: Diagnosis not present

## 2021-01-17 NOTE — Telephone Encounter (Signed)
Below message received from [employer].   received, thanks   Cimberly Stoffel C Talesha Ellithorpe, RN  

## 2021-01-17 NOTE — Telephone Encounter (Signed)
Community message sent to [employer]. Will await response.   Kalliopi Coupland C Estefan Pattison, RN  

## 2021-01-30 ENCOUNTER — Telehealth: Payer: Self-pay | Admitting: Pharmacist

## 2021-01-30 DIAGNOSIS — F17200 Nicotine dependence, unspecified, uncomplicated: Secondary | ICD-10-CM

## 2021-01-30 MED ORDER — NICOTINE 14 MG/24HR TD PT24
14.0000 mg | MEDICATED_PATCH | Freq: Every day | TRANSDERMAL | 1 refills | Status: DC
Start: 2021-01-30 — End: 2021-11-26

## 2021-01-30 NOTE — Telephone Encounter (Signed)
Contacted patient Re tobacco intake reduction/cessation.   Patient reports intake at 10 cigs per day.   Patient reports discontinuing Chantix 6 days ago due to vivid dreams and feeling disoriented. Reports difficulty remember certain things. Reports side effects resolved upon discontinuing Chantix. Discussed NRT therapy and agreed to rstart nicotine patch 14 mg/24 hr. Reports lozenges were nasty and gum caused GI side effects. Reports stressors include family and being the primary caregiver for her mother.   Goal in the next two weeks is to reduce to 5-8 cigs/day. Sent prescription for nicotine patch 14 mg/24 hr to preferred pharmacy  Plan phone follow-up in 2 weeks

## 2021-02-03 NOTE — Telephone Encounter (Signed)
Called patient and provided her phone number for 1-800 QUIT NOW to obtain nicotine replacement therapy (patches) at no cost. Informed patient it may be a couple weeks before she receives supplies. Patient understands and is appreciative. She states she will give them a call.

## 2021-02-03 NOTE — Telephone Encounter (Signed)
Patient returns call to nurse line regarding Nicotine patches. Patient reports issues with picking up rx from pharmacy.   Called pharmacy. Pharmacy reports that patient would need to pick up OTC, as insurance will not cover prescription. Out of pocket cost will be approx $30.  Called patient with update. Patient reports that she cannot afford this cost.   Please advise.   Talbot Grumbling, RN

## 2021-02-03 NOTE — Telephone Encounter (Signed)
Noted and agree. 

## 2021-02-06 ENCOUNTER — Telehealth: Payer: Self-pay | Admitting: Pharmacist

## 2021-02-06 NOTE — Telephone Encounter (Signed)
Called patient to follow up on smoking cessation progress and if she was able to receive nicotine patches from QUIT line. Pt reports she was able to request nicotine patches and she will get them in 9-10 days. Pt reports smoking 12 cigs/day (up from 10 cig/day). Pt reports increased smoking due to anniversary death of her daughter. Counseled patient on proper patch administration. Pt verbalized understanding. Pt has goal of reducing to 5 cigs/day in 1 month.   F/u pt in 1 month to assess patch efficacy/tolerability and smoking cessation progress.    Phone call was conducted by Marlowe Alt, PharmD Candidate with me present.

## 2021-02-09 ENCOUNTER — Other Ambulatory Visit: Payer: Self-pay | Admitting: Family Medicine

## 2021-02-09 DIAGNOSIS — Z789 Other specified health status: Secondary | ICD-10-CM

## 2021-02-09 DIAGNOSIS — Z8719 Personal history of other diseases of the digestive system: Secondary | ICD-10-CM

## 2021-02-25 NOTE — Patient Instructions (Addendum)
It was wonderful to see you today.  Please bring ALL of your medications with you to every visit.   Today we talked about:  Please call your insurance to see what dentists are in network. Try to schedule an appointment ASAP.   For the pain, take Naproxen twice a day with meals. You can use Oragel to the area to help numb.   It is important to follow up with your dentist regularly to ensure your oral health stays  well.   Thank you for choosing [facility].   Please call [phone] with any questions about today's appointment.  Please be sure to schedule follow up at the front  desk before you leave today.   Sharion Settler, DO PGY-1 Family Medicine

## 2021-02-25 NOTE — Progress Notes (Signed)
    SUBJECTIVE:   CHIEF COMPLAINT / HPI:   Tooth Pain Patient presents today to discuss worsening tooth pain x3 to 4 days.  She believes that she has an abscess due to a filling that fell out several weeks ago.  She has had abscesses in the past, last one was a couple years ago.  Does not have a dentist but she received a list from [facility] yesterday and is planning to call.  Has been about 2 years since she last saw dentist.  Has been Tylenol for the pain and took one of her son's pain pills yesterday that helped her sleep.  She is wondering if she needs antibiotics and would like something for pain control.  She denies fevers or difficulty breathing.  She does have pain with chewing and is sensitive to hot and cold foods.  She has radiation of the pain to her surrounding teeth.  PERTINENT  PMH / PSH:  Past Medical History:  Diagnosis Date   Arthritis    Diabetes mellitus without complication ([facility])    GERD (gastroesophageal reflux disease)    Hyperlipidemia    Hypertension     OBJECTIVE:   BP (!) 142/80   Pulse 81   Wt 211 lb (95.7 kg)   SpO2 97%   BMI 41.21 kg/m    General: NAD, pleasant, able to participate in exam Mouth: Poor dentition, chipped tooth right-front side. Several upper and lower caries. MMM. No abscess appreciated. No oral swelling. No drainage.  Respiratory: Breathing comfortably on room air, no respiratory distress Skin: warm and dry, no rashes noted Psych: Normal affect and mood     ASSESSMENT/PLAN:   Pain, dental Likely from exposed nerve from chipped tooth.  Explained sensitivity to hot and cold foods.  There is no swelling or abscess appreciated.  No need for antibiotics at this time.  Pain control with naproxen and Orajel.  Stressed importance of seeing dentist ASAP.  Stressed importance of oral hygiene and following with dentist regularly.  Congratulated and encouraged her on cutting down on her tobacco use.     Sharion Settler, [place]

## 2021-02-26 ENCOUNTER — Ambulatory Visit (INDEPENDENT_AMBULATORY_CARE_PROVIDER_SITE_OTHER): Payer: Medicare HMO | Admitting: Family Medicine

## 2021-02-26 ENCOUNTER — Other Ambulatory Visit: Payer: Self-pay

## 2021-02-26 ENCOUNTER — Ambulatory Visit (INDEPENDENT_AMBULATORY_CARE_PROVIDER_SITE_OTHER): Payer: Medicare HMO

## 2021-02-26 DIAGNOSIS — K0889 Other specified disorders of teeth and supporting structures: Secondary | ICD-10-CM | POA: Insufficient documentation

## 2021-02-26 DIAGNOSIS — Z23 Encounter for immunization: Secondary | ICD-10-CM

## 2021-02-26 MED ORDER — BENZOCAINE 10 % MT GEL: 1.0000 "application " | OROMUCOSAL | 0 refills | Status: DC | PRN

## 2021-02-26 MED ORDER — NAPROXEN 500 MG PO TABS: 500.0000 mg | ORAL_TABLET | Freq: Two times a day (BID) | ORAL | 0 refills | Status: AC

## 2021-02-26 NOTE — Assessment & Plan Note (Signed)
Likely from exposed nerve from chipped tooth.  Explained sensitivity to hot and cold foods.  There is no swelling or abscess appreciated.  No need for antibiotics at this time.  Pain control with naproxen and Orajel.  Stressed importance of seeing dentist ASAP.  Stressed importance of oral hygiene and following with dentist regularly.  Congratulated and encouraged her on cutting down on her tobacco use.

## 2021-02-27 ENCOUNTER — Telehealth: Payer: Self-pay | Admitting: Pharmacist

## 2021-02-27 NOTE — Telephone Encounter (Signed)
Contacted patient regarding smoking cessation progress. Reports down to 8 cigs/day (previously 12 cigs/day). Reports she has received nicotine patches from [facility]. Denies any side effects. Reports wearing the patch at night (works good for her), however reports taking patch off periodically throughout the day to smoke. Reports one of her friends told her she cannot smoke at all while using the patch and wanted clarification on this. Discussed the patch is supposed to be worn continuously for 24 hours and if needed, can be taken off about ~1 hour prior to bedtime to reduce vivid dreams. Discussed it is okay if you slip-up and have a cigarette while using the patch as long as she is decreasing the number of cigarettes smoked per day, with the goal to quit soon. Reassured her that there are no significant concerns with using NRT products like the patch, gum, or lozenges at the same time as another nicotine-containing products like a cigarette, but the ultimate goal is to quit smoking. Patient verbalized understanding. Patient set a goal to reduce number of cigarettes to 3-4 cigs/day in the next two weeks and to cut down smoking after breakfast, lunch, and dinner. Will follow-up via telephone in 2 weeks to assess smoking cessation progress and set a quit date.

## 2021-03-06 ENCOUNTER — Telehealth: Payer: Self-pay

## 2021-03-06 NOTE — Telephone Encounter (Signed)
Contacted patient regarding smoking cessation progress. Reports having quit on Tuesday (03/04/21) and has not smoked since. Plans to leave the house tomorrow but does not plan to buy cigarettes. Reports adherence to nicotine patch and denies side effects. Reports some cravings for cigarettes (every 3-4 hours) but reports making changes to her routines/habits to avoid smoking. Endorses having a strong support system in family that wants her to quit smoking. Encouraged patient on progress in quitting smoking, follow-up in 3 weeks to assess progress. Patient verbalized understanding with care plan.   Supervised by: Lorel Monaco PharmD, BCPS

## 2021-03-12 ENCOUNTER — Other Ambulatory Visit: Payer: Self-pay

## 2021-03-12 MED ORDER — TERCONAZOLE 0.4 % VA CREA: TOPICAL_CREAM | VAGINAL | 0 refills | Status: DC

## 2021-03-20 ENCOUNTER — Other Ambulatory Visit: Payer: Self-pay | Admitting: Family Medicine

## 2021-03-20 DIAGNOSIS — E1159 Type 2 diabetes mellitus with other circulatory complications: Secondary | ICD-10-CM

## 2021-03-20 DIAGNOSIS — I10 Essential (primary) hypertension: Secondary | ICD-10-CM

## 2021-03-20 DIAGNOSIS — I152 Hypertension secondary to endocrine disorders: Secondary | ICD-10-CM

## 2021-03-27 ENCOUNTER — Telehealth: Payer: Self-pay | Admitting: Pharmacist

## 2021-03-27 NOTE — Telephone Encounter (Signed)
Patient contacted for follow/up of tobacco intake reduction / tobacco cessation attempt.   Since last contact patient reports that she has continued to smoke about 5-7 cigarettes per day   Medications currently being used;  Nicotine patch  7mg  which has lead to increased smoking of ~ 7 per day.  She requested a return to the 14mg  patch.   Most common triggers to use tobacco include; Stress    Contacted 1 800-QUIT NOW support program to request additional 14mg  patches.   Total time with patient call and documentation of interaction: 9 minutes.  F/U Phone call planned: 2 weeks.

## 2021-03-27 NOTE — Telephone Encounter (Signed)
-----   Message from Avie Arenas, [facility] sent at 03/13/2021 10:21 AM EDT ----- Regarding: smoking cessation call Reports quitting Tuesday (03/04/21) and has not smoked since. Reports adherence to nicotine patch.

## 2021-04-10 ENCOUNTER — Telehealth: Payer: Self-pay | Admitting: Student-PharmD

## 2021-04-10 NOTE — Telephone Encounter (Signed)
Called patient for follow up of smoking cessation attempt/intake reduction.   Since last contact patient reports she has decreased her amount of smoking from 5-7 cigarettes per day to 1 cigarette for the last 3 days. She has set a new quit date for 04/13/21. She has received 14 and 21 mg nicotine patches from the quit line. She used the 21 mg patch for a few days but found she only needed the 14 mg patch. Discussed plan to continue 14 mg patch for now.   Will call patient next week after her quit date to discuss progress with quitting.

## 2021-04-17 ENCOUNTER — Telehealth: Payer: Self-pay | Admitting: Student-PharmD

## 2021-04-17 NOTE — Telephone Encounter (Signed)
Called patient for follow up regarding smoking cessation. During our phone call last week she set a quit date for 7/31.   She tells me today that 7/31 was her last cigarette and she has smoked none since then (today is day 4). Congratulated patient on her success so far! She said it has been hard but she wants to keep up with this. She increased back to the 21 mg nicotine patch because she "did not want to take any chances" after quitting. She will continue on 21 mg for now and we will follow up regarding decreasing to 14 mg once she feels more confident and experiencing less cravings.   I will call her next week to check in on her progress with quitting.

## 2021-04-24 ENCOUNTER — Telehealth: Payer: Self-pay | Admitting: Student-PharmD

## 2021-04-24 NOTE — Telephone Encounter (Signed)
Called patient to discuss smoking cessation progress. No answer, unable to leave VM. Will try her again later.

## 2021-04-24 NOTE — Telephone Encounter (Signed)
Reached patient but she was not available to talk at this time. Asked if I could call her back on Monday. Will try her again then.

## 2021-04-28 NOTE — Telephone Encounter (Signed)
Called patient and discussed how smoking cessation is going. Her quit date was 7/31. She tells me today that she was out socially on Saturday night and smoked a couple cigarettes but otherwise hasn't smoked any since 7/31. Encouraged her that this was still great progress to only have 1 evening slip up out of 2 weeks and encouraged her to continue her efforts to quit completely which she states she is still motivated to do. She is nearly out of 21 mg nicotine patches remaining and has no remaining 14 mg patches but she is planning to call the Quit Line to get more. Provided phone number for [facility] [employer].   Asked if there was anything I can do to help her succeed with quitting and she said not at this time but that I could continue to call her to check in and see how she is doing. Will call her in next 1-2 weeks.

## 2021-04-30 ENCOUNTER — Other Ambulatory Visit: Payer: Self-pay | Admitting: Family Medicine

## 2021-04-30 ENCOUNTER — Telehealth: Payer: Self-pay

## 2021-04-30 MED ORDER — FLUCONAZOLE 150 MG PO TABS: 150.0000 mg | ORAL_TABLET | Freq: Once | ORAL | 0 refills | Status: AC

## 2021-04-30 NOTE — Telephone Encounter (Signed)
Patient calls nurse line requesting diflucan for yeast. Patient endorses itching on the outside and thick white discharge on the inside. Patient reports she has used OTC creams with no relief. Patient reports her type 2 DM is usually the cause. We have no apts this week. Please advise on medication.

## 2021-04-30 NOTE — Telephone Encounter (Signed)
I have prescribed diflucan '150mg'$  and sent to her pharmacy. I recommend pt coming in for A1c as her last check was 4 months ago. Please have pt book this appointment. Thank you.

## 2021-05-05 ENCOUNTER — Other Ambulatory Visit: Payer: Self-pay

## 2021-05-05 ENCOUNTER — Ambulatory Visit (INDEPENDENT_AMBULATORY_CARE_PROVIDER_SITE_OTHER): Payer: Medicare HMO

## 2021-05-05 DIAGNOSIS — Z111 Encounter for screening for respiratory tuberculosis: Secondary | ICD-10-CM | POA: Diagnosis not present

## 2021-05-05 NOTE — Progress Notes (Signed)
Patient is here for a PPD placement.  PPD placed in left forearm @ 0905 am.  Patient will return 05/07/2021 to have PPD read. Talbot Grumbling, RN

## 2021-05-07 ENCOUNTER — Ambulatory Visit (INDEPENDENT_AMBULATORY_CARE_PROVIDER_SITE_OTHER): Payer: Medicare HMO

## 2021-05-07 ENCOUNTER — Other Ambulatory Visit: Payer: Self-pay

## 2021-05-07 DIAGNOSIS — Z111 Encounter for screening for respiratory tuberculosis: Secondary | ICD-10-CM

## 2021-05-07 LAB — TB SKIN TEST
Induration: 0 mm
TB Skin Test: NEGATIVE

## 2021-05-07 NOTE — Progress Notes (Signed)
Patient is here for a PPD read.  It was placed on 05/05/21 in the left forearm @ 0905 am.    PPD RESULTS:  Result: negative Induration: 0 mm  Letter created and given to patient for documentation purposes. Talbot Grumbling, RN

## 2021-05-08 ENCOUNTER — Telehealth: Payer: Self-pay | Admitting: Student-PharmD

## 2021-05-08 NOTE — Telephone Encounter (Signed)
Pt has appointment scheduled on 05/20/2021 for this.Alexandra Campos, CMA

## 2021-05-08 NOTE — Telephone Encounter (Signed)
Called patient to discuss smoking cessation progress. She has not smoked any cigarettes since we last talked last Monday. Congratulated her on this progress as she is coming up on 1 month since quitting, with only smoking 2 cigarettes on one occasion. She ran out of nicotine patches and decided not to call the quit line for more because she wanted to see if she could do it on her own. States that it has been hard but she wants to stick with it. Encouraged her that it will get easier with time. States that she thinks about smoking all the time because she did it for so long but is able to do other things to get her mind off it, like eating candy, chewing on a cough drop, etc. She does not have transportation so this helps her to not buy cigarettes and can keep them out of her house.   Will follow up with her in a couple weeks to see how she is doing. Encouraged her to call the quit line for more patches if she finds she needs them in the meantime or if her cravings become too severe.

## 2021-05-14 ENCOUNTER — Other Ambulatory Visit: Payer: Self-pay | Admitting: Family Medicine

## 2021-05-14 DIAGNOSIS — Z789 Other specified health status: Secondary | ICD-10-CM

## 2021-05-20 ENCOUNTER — Encounter: Payer: Self-pay | Admitting: Family Medicine

## 2021-05-20 ENCOUNTER — Other Ambulatory Visit: Payer: Self-pay

## 2021-05-20 ENCOUNTER — Ambulatory Visit (INDEPENDENT_AMBULATORY_CARE_PROVIDER_SITE_OTHER): Payer: Medicare HMO | Admitting: Family Medicine

## 2021-05-20 VITALS — BP 149/78 | HR 100 | Ht 60.0 in | Wt 212.0 lb

## 2021-05-20 DIAGNOSIS — I129 Hypertensive chronic kidney disease with stage 1 through stage 4 chronic kidney disease, or unspecified chronic kidney disease: Secondary | ICD-10-CM | POA: Diagnosis not present

## 2021-05-20 DIAGNOSIS — E1122 Type 2 diabetes mellitus with diabetic chronic kidney disease: Secondary | ICD-10-CM | POA: Diagnosis not present

## 2021-05-20 DIAGNOSIS — N183 Chronic kidney disease, stage 3 unspecified: Secondary | ICD-10-CM

## 2021-05-20 LAB — POCT GLYCOSYLATED HEMOGLOBIN (HGB A1C): HbA1c, POC (controlled diabetic range): 9.6 % — AB (ref 0.0–7.0)

## 2021-05-20 MED ORDER — EMPAGLIFLOZIN 25 MG PO TABS: 25.0000 mg | ORAL_TABLET | Freq: Every day | ORAL | 3 refills | Status: DC

## 2021-05-20 NOTE — Progress Notes (Signed)
    SUBJECTIVE:   CHIEF COMPLAINT / HPI:   Type 2 Diabetes Last seen 5 months ago. A1c 8.7% at that time. Currently taking Metformin '500mg'$  twice daily and Jardiance '10mg'$  daily. Reports she is supposed to take Metformin '1000mg'$  BID but is only doing '500mg'$  BID because she was feeling super hungry and having low sugars (fasting in the 80s).  Checking sugars twice per week at home. Only checks when she feels like it's high or low. Did not bring log. Doing "okay" with dietary compliance- admits to snacking on chips and "starchy stuff". Will eat 1-2 desserts per week. Drinks 759m bottle of vodka on the weekends.  PERTINENT  PMH / PSH: T2DM, CKD stage 3a, HTN, tobacco use, carpal tunnel  OBJECTIVE:   BP (!) 149/78   Pulse 100   Ht 5' (1.524 m)   Wt 212 lb (96.2 kg)   SpO2 100%   BMI 41.40 kg/m   General: NAD, pleasant, able to participate in exam Cardiac: RRR, S1 S2 present. normal heart sounds, no murmurs. Respiratory: CTAB, normal effort, No wheezes, rales or rhonchi Extremities: no edema or cyanosis. Skin: warm and dry, no rashes noted Neuro: alert, no obvious focal deficits Psych: Normal affect and mood   ASSESSMENT/PLAN:   Type 2 DM with CKD stage 3 and hypertension ([place] Poorly controlled. A1c 9.6% today. Her A1c has been gradually trending up over the past 2 years (7.2>8.2>8.7>9.6). Will increase Jardiance to '25mg'$  daily today. Continue Metformin XL '500mg'$  BID. Discussed we may need to re-trial increased dose of Metformin in the future. She will check sugars daily at home and bring written log to next appt. Advised that her weekend alcohol intake may be contributing. Follow up in 1 month.    AAlcus Dad MD [facility]

## 2021-05-20 NOTE — Patient Instructions (Addendum)
It was great to see you!  Your A1c was 9.6% today, which means your diabetes is not well-controlled. -We will increase your Jardiance to '25mg'$  daily -Continue taking your Metformin '500mg'$  twice daily. We may need to increase this in the future if your sugars are still elevated -Please check your sugar every day. Bring a written log of your sugars to your next appointment -Be mindful of your dietary habits (minimize added sugars, processed foods, sweets, and sweetened beverages) -Your weekend alcohol intake may also be contributing to your elevated sugars. We will continue to discuss this at future visits. Try to cut back slowly if possible.  Take care and seek immediate care sooner if you develop any concerns.  Dr. [facility]

## 2021-05-21 NOTE — Assessment & Plan Note (Addendum)
Poorly controlled. A1c 9.6% today. Her A1c has been gradually trending up over the past 2 years (7.2>8.2>8.7>9.6). Will increase Jardiance to '25mg'$  daily today. Continue Metformin XL '500mg'$  BID. Discussed we may need to re-trial increased dose of Metformin in the future. She will check sugars daily at home and bring written log to next appt. Advised that her weekend alcohol intake may be contributing. Follow up in 1 month.

## 2021-05-22 ENCOUNTER — Other Ambulatory Visit: Payer: Self-pay | Admitting: Family Medicine

## 2021-05-22 ENCOUNTER — Telehealth: Payer: Self-pay | Admitting: Student-PharmD

## 2021-05-22 NOTE — Telephone Encounter (Signed)
Called patient to follow up on smoking cessation progress. Last call she decided not to call the quit line for more patches after she ran out because she wanted to see if she could keep it up on her own. Patient reports she "relapsed" over the weekend from "too much partying." She is not sure how much she smoked but she stopped on Monday night and hasn't smoked since. She bought 14 mg nicotine patches at the store for almost $30 and has been using those since. Discussed that it is probably best to stay on the patches for now to prevent another relapse. Discussed triggers on the weekends that lead to her smoking - she says it is mostly drinking and being around other people who smoke. She is still motivated to quit. Encouraged her that just because she smoked over the weekend doesn't mean she can't still quit going forward. Progression with smoking cessation is not always linear. Will call her in 2 weeks at patient's request to follow up on smoking cessation.

## 2021-05-26 ENCOUNTER — Ambulatory Visit (INDEPENDENT_AMBULATORY_CARE_PROVIDER_SITE_OTHER): Payer: Medicare HMO | Admitting: Family Medicine

## 2021-05-26 ENCOUNTER — Other Ambulatory Visit: Payer: Self-pay

## 2021-05-26 VITALS — BP 137/76 | HR 83

## 2021-05-26 DIAGNOSIS — Z23 Encounter for immunization: Secondary | ICD-10-CM

## 2021-05-26 DIAGNOSIS — F17200 Nicotine dependence, unspecified, uncomplicated: Secondary | ICD-10-CM | POA: Diagnosis not present

## 2021-05-26 MED ORDER — BENZONATATE 100 MG PO CAPS
100.0000 mg | ORAL_CAPSULE | Freq: Two times a day (BID) | ORAL | 0 refills | Status: DC | PRN
Start: 2021-05-26 — End: 2021-07-31

## 2021-05-26 NOTE — Assessment & Plan Note (Signed)
>>  ASSESSMENT AND PLAN FOR TOBACCO DEPENDENCE WRITTEN ON 05/26/2021  5:15 PM BY CHET MAD  Patient has a very long history of tobacco use.  I congratulated her on her efforts to cut back.  She is now down to 5-7 cigarettes a day.  Prior to using the patches she had been smoking 1 pack/day.  Unclear if symptoms that she experienced were related to the new patches, timeline would suggest that, however this seems unlikely. Reoccurrence of symptoms with re-introduction of the same patches would help to further assess this.  I am reassured against any infectious cause of her symptoms given reassuring exam and symptoms spontaneously resolved without treatment.  Encouraged her to recontact the smoking hotline for continued patches since she has been doing well with them.  -Rx Tessalon  Perles to use as needed for cough -Return precautions discussed.  No need for antibiotics at this time. -Flu vaccine administered today

## 2021-05-26 NOTE — Assessment & Plan Note (Addendum)
Patient has a very long history of tobacco use.  I congratulated her on her efforts to cut back.  She is now down to 5-7 cigarettes a day.  Prior to using the patches she had been smoking 1 pack/day.  Unclear if symptoms that she experienced were related to the new patches, timeline would suggest that, however this seems unlikely. Reoccurrence of symptoms with re-introduction of the same patches would help to further assess this.  I am reassured against any infectious cause of her symptoms given reassuring exam and symptoms spontaneously resolved without treatment.  Encouraged her to recontact the smoking hotline for continued patches since she has been doing well with them.  -Rx Tessalon Perles to use as needed for cough -Return precautions discussed.  No need for antibiotics at this time. -Flu vaccine administered today

## 2021-05-26 NOTE — Progress Notes (Addendum)
    SUBJECTIVE:   CHIEF COMPLAINT / HPI:   Cough  On Tobacco Replacement Therapy Symptoms started last Wednesday, 9/7 with worsening cough and phlegm that was clear. She chronically has a cough with phlegm, but this was worse. She is now back to her baseline cough. She attributes her symptoms to picking up new [facility] Nicotine patches.  She has been wearing nicotine patches for several months, however, usually gets them from the 1 800 quit now hotline.  She ran out of these and states she picked up some new ones from [facility] because she was frustrated with how often the hotline would contact her.  States "I would not even think about smoking and then they would contact me and I would want to smoke".  Her symptoms continued for 2 days.  Since she thought they were from the patches, she decided to stop wearing the patches.  Her symptoms then resolved.  She did not try any other medication for her symptoms.  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 Is up to date on COVID vaccines. Denies chest pain. Feels SOB intermittently- attributes this to smoking.    PERTINENT  PMH / PSH:  Past Medical History:  Diagnosis Date   Arthritis    Diabetes mellitus without complication (HCC)    GERD (gastroesophageal reflux disease)    Hyperlipidemia    Hypertension     OBJECTIVE:   BP 137/76   Pulse 83   SpO2 100%    General: NAD, pleasant, able to participate in exam HEENT: No pharyngeal erythema, no cervical LAD. Nares patent without rhinorrhea Cardiac: RRR Respiratory: CTAB, normal effort, No wheezes, rales or rhonchi Abdomen: Bowel sounds present, nontender Skin: warm and dry, no rashes noted  ASSESSMENT/PLAN:   Tobacco dependence Patient has a very long history of tobacco use.  I congratulated her on her efforts to cut back.  She is now down to 5-7 cigarettes a day.  Prior to using the patches she had been smoking 1 pack/day.  Unclear if symptoms that she experienced were  related to the new patches, timeline would suggest that, however this seems unlikely. Reoccurrence of symptoms with re-introduction of the same patches would help to further assess this.  I am reassured against any infectious cause of her symptoms given reassuring exam and symptoms spontaneously resolved without treatment.  Encouraged her to recontact the smoking hotline for continued patches since she has been doing well with them.  -Rx Tessalon Perles to use as needed for cough -Return precautions discussed.  No need for antibiotics at this time. -Flu vaccine administered today    Sharion Settler, [place]

## 2021-05-26 NOTE — Patient Instructions (Signed)
It was wonderful to see you today.  Please bring ALL of your medications with you to every visit.   Today we talked about:  -Contact your podiatrist, Dr. Cannon Kettle at [phone] -I am sending a prescription for Tessalon Perles. You can use this twice a day as needed for a cough.  -Contact the smoking hotline at 1-800-QUIT-NOW for a refill on the patches.  -You received your Flu shot today.   Take care and seek immediate care sooner if you develop any concerns.   Please call [phone] with any questions about today's appointment.  Sharion Settler, DO PGY-2 Family Medicine

## 2021-06-03 ENCOUNTER — Telehealth: Payer: Self-pay

## 2021-06-03 ENCOUNTER — Other Ambulatory Visit: Payer: Self-pay | Admitting: Family Medicine

## 2021-06-03 MED ORDER — FLUCONAZOLE 150 MG PO TABS: 150.0000 mg | ORAL_TABLET | Freq: Once | ORAL | 0 refills | Status: AC

## 2021-06-03 NOTE — Telephone Encounter (Signed)
Sent in diflucan to the pharmacy. Recommend follow up in clinic if she has persistent symptoms.

## 2021-06-03 NOTE — Telephone Encounter (Signed)
Patient calls nurse line requesting diflucan. Patient reports symptoms of yeast for the past week. We have no apts. Please advise.

## 2021-06-12 ENCOUNTER — Other Ambulatory Visit: Payer: Self-pay

## 2021-06-12 DIAGNOSIS — I70229 Atherosclerosis of native arteries of extremities with rest pain, unspecified extremity: Secondary | ICD-10-CM

## 2021-06-13 ENCOUNTER — Telehealth: Payer: Self-pay

## 2021-06-13 NOTE — Telephone Encounter (Signed)
Received fax from [place] asking for a refill of the following not found on med list.  True Metrix Blood Glucose MTR [employer] True Metrix Test Strips True Plus 33G Lancets.  Ottis Stain, CMA

## 2021-06-16 ENCOUNTER — Other Ambulatory Visit: Payer: Self-pay | Admitting: Family Medicine

## 2021-06-17 ENCOUNTER — Telehealth: Payer: Self-pay

## 2021-06-17 NOTE — Telephone Encounter (Signed)
Received fax from [facility] requesting a Rx of the glucose monitoring supplies below.  True Metrix Blood Glucose MTR [employer] True Metrix Test Strip TruePlus 33G Lancets  Please send Rx to [facility].  Ottis Stain, CMA

## 2021-06-17 NOTE — Telephone Encounter (Signed)
I have called them in now. Thank you.

## 2021-06-19 ENCOUNTER — Telehealth: Payer: Self-pay | Admitting: Student-PharmD

## 2021-06-19 NOTE — Telephone Encounter (Signed)
Called patient to follow up on smoking cessation. She said she has not been doing too well lately with quitting. She had three cousins who were siblings die in the span of 3 weeks from natural causes and a young man she helped raise die from a fentanyl overdose after that, so she has been under quite a bit of stress recently. She wants to get back to trying to quit smoking now. Currently smoking at least 10 cigarettes per day. She called the quit line to get more patches since the ones she bought from [place] were "making her sick." She now has 14 mg patches that arrived yesterday and she is going to put one on today when she goes to work and to start actively trying to decrease how much she is smoking. She hopes that over the next month she will have quit completely. She requests that I call her in 3 weeks to see how she is doing. Let her know to call in the meantime if she needs anything.

## 2021-07-01 ENCOUNTER — Ambulatory Visit (INDEPENDENT_AMBULATORY_CARE_PROVIDER_SITE_OTHER): Payer: Medicare HMO

## 2021-07-01 ENCOUNTER — Other Ambulatory Visit: Payer: Self-pay

## 2021-07-01 DIAGNOSIS — Z23 Encounter for immunization: Secondary | ICD-10-CM

## 2021-07-07 ENCOUNTER — Other Ambulatory Visit: Payer: Self-pay | Admitting: Family Medicine

## 2021-07-07 DIAGNOSIS — I10 Essential (primary) hypertension: Secondary | ICD-10-CM

## 2021-07-10 ENCOUNTER — Telehealth: Payer: Self-pay | Admitting: Student-PharmD

## 2021-07-10 NOTE — Telephone Encounter (Signed)
Called to discuss smoking cessation progress. She was busy and requested call back next week. Will try her again next Thursday.

## 2021-07-14 ENCOUNTER — Other Ambulatory Visit: Payer: Self-pay | Admitting: Family Medicine

## 2021-07-14 DIAGNOSIS — I152 Hypertension secondary to endocrine disorders: Secondary | ICD-10-CM

## 2021-07-14 DIAGNOSIS — I10 Essential (primary) hypertension: Secondary | ICD-10-CM

## 2021-07-14 DIAGNOSIS — E1159 Type 2 diabetes mellitus with other circulatory complications: Secondary | ICD-10-CM

## 2021-07-18 ENCOUNTER — Telehealth: Payer: Self-pay

## 2021-07-18 DIAGNOSIS — B3731 Acute candidiasis of vulva and vagina: Secondary | ICD-10-CM

## 2021-07-18 MED ORDER — FLUCONAZOLE 150 MG PO TABS: 150.0000 mg | ORAL_TABLET | Freq: Once | ORAL | 0 refills | Status: AC

## 2021-07-18 NOTE — Telephone Encounter (Signed)
A1c elevatd at 9.6% and pt reports vaginal yeast infection. Will send diflucan 150 mg x1 for weekend, but she will need evaluation in person if not improved after this treatment.  Gladys Damme, MD [facility], PGY-3

## 2021-07-18 NOTE — Telephone Encounter (Signed)
Patient calls nurse line requesting rx refill on fluconazole for recurrent yeast infections. Patient reports vaginal itching and irritation. Denies vaginal discharge. Denies new sexual partners.   Please advise.   Talbot Grumbling, RN

## 2021-07-21 ENCOUNTER — Other Ambulatory Visit: Payer: Self-pay

## 2021-07-21 DIAGNOSIS — I70229 Atherosclerosis of native arteries of extremities with rest pain, unspecified extremity: Secondary | ICD-10-CM

## 2021-07-24 ENCOUNTER — Telehealth: Payer: Self-pay | Admitting: Student-PharmD

## 2021-07-24 NOTE — Telephone Encounter (Signed)
Called patient to discuss smoking cessation progress. She reports that she is "not doing well with it." She reports she has a CT Tuesday then sees Vascular Thursday for blockages in her legs which she says if because of her years smoking. She says after those appointments she'll need to decide if she wants to really quit smoking. At first she said she did not want me to contact her again but then said I could call her again in a month to see how she's doing and if she has decided to quit or not. Will follow up with her at that time.

## 2021-07-29 ENCOUNTER — Ambulatory Visit (HOSPITAL_COMMUNITY)
Admission: RE | Admit: 2021-07-29 | Discharge: 2021-07-29 | Disposition: A | Discharge status: Home / Self Care | Payer: Medicare HMO | Source: Ambulatory Visit | Attending: Vascular Surgery | Admitting: Vascular Surgery

## 2021-07-29 ENCOUNTER — Other Ambulatory Visit: Payer: Self-pay

## 2021-07-29 DIAGNOSIS — K7689 Other specified diseases of liver: Secondary | ICD-10-CM | POA: Diagnosis not present

## 2021-07-29 DIAGNOSIS — I739 Peripheral vascular disease, unspecified: Secondary | ICD-10-CM | POA: Diagnosis not present

## 2021-07-29 DIAGNOSIS — N281 Cyst of kidney, acquired: Secondary | ICD-10-CM | POA: Diagnosis not present

## 2021-07-29 DIAGNOSIS — I70229 Atherosclerosis of native arteries of extremities with rest pain, unspecified extremity: Secondary | ICD-10-CM | POA: Insufficient documentation

## 2021-07-29 DIAGNOSIS — K8020 Calculus of gallbladder without cholecystitis without obstruction: Secondary | ICD-10-CM | POA: Diagnosis not present

## 2021-07-29 LAB — POCT I-STAT CREATININE: Creatinine, Ser: 1.2 mg/dL — ABNORMAL HIGH (ref 0.44–1.00)

## 2021-07-29 MED ORDER — IOHEXOL 350 MG/ML SOLN
100.0000 mL | Freq: Once | INTRAVENOUS | Status: AC | PRN
  Administered 2021-07-29: 100 mL via INTRAVENOUS

## 2021-07-31 ENCOUNTER — Other Ambulatory Visit: Payer: Self-pay

## 2021-07-31 ENCOUNTER — Encounter: Payer: Self-pay | Admitting: Vascular Surgery

## 2021-07-31 ENCOUNTER — Ambulatory Visit (INDEPENDENT_AMBULATORY_CARE_PROVIDER_SITE_OTHER): Payer: Medicare HMO | Admitting: Vascular Surgery

## 2021-07-31 VITALS — BP 147/90 | HR 75 | Temp 98.1°F | Resp 20 | Ht 60.0 in | Wt 205.0 lb

## 2021-07-31 DIAGNOSIS — I70219 Atherosclerosis of native arteries of extremities with intermittent claudication, unspecified extremity: Secondary | ICD-10-CM

## 2021-07-31 DIAGNOSIS — F17210 Nicotine dependence, cigarettes, uncomplicated: Secondary | ICD-10-CM

## 2021-07-31 DIAGNOSIS — Z6841 Body Mass Index (BMI) 40.0 and over, adult: Secondary | ICD-10-CM | POA: Diagnosis not present

## 2021-07-31 DIAGNOSIS — E119 Type 2 diabetes mellitus without complications: Secondary | ICD-10-CM | POA: Diagnosis not present

## 2021-07-31 NOTE — Progress Notes (Signed)
REASON FOR VISIT:   Follow-up of peripheral arterial disease.  MEDICAL ISSUES:   PERIPHERAL ARTERIAL DISEASE: This patient has significant infrainguinal arterial occlusive disease bilaterally with stable claudication.  Her only option for revascularization on both sides would be an extensive femoral endarterectomy and femoropopliteal bypass grafting.  She would be at high risk for infection given her obesity and diabetes.  I have again discussed the importance of exercise and encouraged her to walk is much as possible.  We have also discussed the importance of nutrition.  I will plan on seeing her back in 9 months with follow-up ABIs.  Again I am reluctant to consider surgery because of the risk of infection and she to is not especially interested in surgery.  She knows to call sooner if she has problems.  She is on a statin.  I have also asked her to begin taking 81 mg of aspirin a day.  I am not sure why she discontinued this.  TOBACCO ABUSE: We have had a long discussion about the importance of tobacco cessation (3 min).  She understands that this does cause atherosclerosis and also causes vasoconstriction of the microcirculation and collaterals.  She is hoping to move into an independent living area and not smoke near where the stress may be lower than what she is experiencing now living with her son.  OVARIAN MASS: An incidental finding on her CT scan was a 7 cm right pelvic mass possibly consistent with an ovarian dermoid tumor.  I will notify her primary care physician Dr. Posey Pronto.  HPI:   Alexandra Campos is a pleasant 66 y.o. female who I last saw on 01/15/2021 with peripheral vascular disease.  She comes in for a 26-monthfollow-up visit.  She had infrainguinal arterial occlusive disease and also bulky calcific plaques in the common femoral arteries.  At that time she was not having any rest pain or nonhealing ulcers and I favor continued conservative treatment.  The patient did have an  arteriogram on 03/15/2020.  At that time there was no significant aortoiliac occlusive disease.  On the right side there was extensive calcific plaque in the common femoral artery.  The superficial femoral artery was occluded.  The below-knee popliteal artery was patent with three-vessel runoff.  On the left side there was extensive calcific plaque in the common femoral artery.  The superficial femoral artery was occluded at its origin.  There was reconstitution of the popliteal artery at the level of the knee.  Given her obesity and comorbidities I favor a nonoperative approach.  If her symptoms progressed she would require staged femoral endarterectomies and femoropopliteal bypass grafts.  They should be at high risk for infection with surgery given her obesity.  Since I saw her last, she has stable claudication in both lower extremities.  She experiences pain in both thighs and calves which is brought on by ambulation and relieved with rest.  She walks 2 blocks a day she tells me.  The symptoms have been stable.  She denies any history of rest pain or nonhealing ulcers.  Her symptoms are slightly more significant on the right side.  Past Medical History:  Diagnosis Date   Arthritis    Diabetes mellitus without complication (HCC)    GERD (gastroesophageal reflux disease)    Hyperlipidemia    Hypertension     Family History  Problem Relation Age of Onset   Diabetes Mother    Hypertension Mother    Diabetes Father  Hypertension Father    Cancer Father    Diabetes Brother    Hypertension Brother    Colon cancer Brother 88   Diabetes Daughter    Hypertension Daughter    Cancer Paternal Uncle    Diabetes Maternal Grandmother    Hypertension Maternal Grandmother    Colon polyps Neg Hx    Esophageal cancer Neg Hx    Rectal cancer Neg Hx    Stomach cancer Neg Hx     SOCIAL HISTORY: Social History   Tobacco Use   Smoking status: Every Day    Packs/day: 0.50    Types: Cigarettes    Smokeless tobacco: Never  Substance Use Topics   Alcohol use: Yes    Alcohol/week: 5.0 standard drinks    Types: 5 Cans of beer per week    Comment: 5 drinks/week     No Known Allergies  Current Outpatient Medications  Medication Sig Dispense Refill   Acetaminophen (TYLENOL PO) Take by mouth.     albuterol (VENTOLIN HFA) 108 (90 Base) MCG/ACT inhaler Inhale 2 puffs into the lungs every 4 (four) hours as needed for wheezing or shortness of breath. 1 each 0   atorvastatin (LIPITOR) 40 MG tablet Take 1 tablet by mouth once daily 90 tablet 0   benzocaine (ORAJEL) 10 % mucosal gel Use as directed 1 application in the mouth or throat as needed for mouth pain. 5.3 g 0   blood glucose meter kit and supplies KIT 1 each by Does not apply route daily. Dispense based on patient and insurance preference. Use up to four times daily as directed. (FOR ICD-9 250.00, 250.01). 1 each 0   cetirizine (ZYRTEC) 10 MG tablet Take 1 tablet by mouth once daily 30 tablet 0   chlorthalidone (HYGROTON) 25 MG tablet Take 1 tablet (25 mg total) by mouth daily. 90 tablet 3   diclofenac Sodium (VOLTAREN) 1 % GEL APPLY 4 GRAMS  TOPICALLY 4 TIMES DAILY FOR 14 DAYS 200 g 0   empagliflozin (JARDIANCE) 25 MG TABS tablet Take 1 tablet (25 mg total) by mouth daily. 90 tablet 3   glucose blood (ACCU-CHEK GUIDE) test strip USE 1 STRIP TWICE DAILY FOR BLOOD SUGAR 100 each 0   Lancets Thin MISC 1 each by Does not apply route as needed. 100 each 0   losartan (COZAAR) 50 MG tablet Take 1 tablet (50 mg total) by mouth daily. Needs appt for future refills. 90 tablet 0   metFORMIN (GLUCOPHAGE-XR) 500 MG 24 hr tablet Take 2 tablets (1,000 mg total) by mouth in the morning and at bedtime. 360 tablet 3   Multiple Vitamin (MULTIVITAMIN PO) Take by mouth.     nicotine (NICODERM CQ - DOSED IN MG/24 HOURS) 14 mg/24hr patch Place 1 patch (14 mg total) onto the skin daily. 28 patch 1   omeprazole (PRILOSEC) 20 MG capsule Take 1 capsule by mouth  once daily 90 capsule 0   No current facility-administered medications for this visit.    REVIEW OF SYSTEMS:  '[X]'  denotes positive finding, '[ ]'  denotes negative finding Cardiac  Comments:  Chest pain or chest pressure:    Shortness of breath upon exertion:    Short of breath when lying flat:    Irregular heart rhythm:        Vascular    Pain in calf, thigh, or hip brought on by ambulation: x   Pain in feet at night that wakes you up from your sleep:  Blood clot in your veins:    Leg swelling:         Pulmonary    Oxygen at home:    Productive cough:     Wheezing:         Neurologic    Sudden weakness in arms or legs:     Sudden numbness in arms or legs:     Sudden onset of difficulty speaking or slurred speech:    Temporary loss of vision in one eye:     Problems with dizziness:         Gastrointestinal    Blood in stool:     Vomited blood:         Genitourinary    Burning when urinating:     Blood in urine:        Psychiatric    Major depression:         Hematologic    Bleeding problems:    Problems with blood clotting too easily:        Skin    Rashes or ulcers:        Constitutional    Fever or chills:     PHYSICAL EXAM:   Vitals:   07/31/21 0853  BP: (!) 147/90  Pulse: 75  Resp: 20  Temp: 98.1 F (36.7 C)  SpO2: 96%  Weight: 205 lb (93 kg)  Height: 5' (1.524 m)   Body mass index is 40.04 kg/m.  GENERAL: The patient is a well-nourished female, in no acute distress. The vital signs are documented above. CARDIAC: There is a regular rate and rhythm.  VASCULAR: I do not detect carotid bruits. She has palpable femoral pulses.  Her femoral arteries bilaterally are calcified on exam. On the right side she has a monophasic anterior tibial signal and a fairly brisk but monophasic posterior tibial signal. On the left side she has a brisk monophasic anterior tibial and posterior tibial signal. PULMONARY: There is good air exchange bilaterally  without wheezing or rales. ABDOMEN: Soft and non-tender with normal pitched bowel sounds.  MUSCULOSKELETAL: There are no major deformities or cyanosis. NEUROLOGIC: No focal weakness or paresthesias are detected. SKIN: There are no ulcers or rashes noted. PSYCHIATRIC: The patient has a normal affect.  DATA:    CT ANGIO WITH RUNOFF: I have independently interpreted her CT angio that was done on 07/29/2021.  This did not show any significant aortoiliac occlusive disease however she had severe calcific disease in both common femoral arteries and bilateral superficial femoral artery occlusions.  She also had bilateral renal artery stenoses which are more significant on the left side.  She was also noted to have a 7 sonometer right pelvic mass containing fat most compatible with an ovarian dermoid tumor.  LABS: Her creatinine on 07/29/2021 was 1.2.  [name] [facility] [phone]

## 2021-08-01 ENCOUNTER — Other Ambulatory Visit: Payer: Self-pay | Admitting: Family Medicine

## 2021-08-01 DIAGNOSIS — Z8719 Personal history of other diseases of the digestive system: Secondary | ICD-10-CM

## 2021-08-04 ENCOUNTER — Other Ambulatory Visit: Payer: Self-pay

## 2021-08-04 ENCOUNTER — Telehealth: Payer: Self-pay

## 2021-08-04 DIAGNOSIS — I70219 Atherosclerosis of native arteries of extremities with intermittent claudication, unspecified extremity: Secondary | ICD-10-CM

## 2021-08-04 DIAGNOSIS — N838 Other noninflammatory disorders of ovary, fallopian tube and broad ligament: Secondary | ICD-10-CM

## 2021-08-04 NOTE — Telephone Encounter (Signed)
Patient calls nurse line to make provider aware of finding on recent CT scan. Please see below.   NON-VASCULAR   1. 7.0 cm right pelvic mass containing fat. This is most compatible with an ovarian dermoid tumor. 2. Probable uterine fibroids. 3. Cholelithiasis. 4. Multiple small hypodensities in the liver. These are too small to definitively characterize. This could represent small cysts but indeterminate. Consider right upper quadrant ultrasound for further characterization.  Patient reports that this report is also being sent to [facility]. Patient was told to also reach out to PCP regarding findings.   Talbot Grumbling, RN

## 2021-08-05 ENCOUNTER — Telehealth: Payer: Self-pay | Admitting: Family Medicine

## 2021-08-05 ENCOUNTER — Other Ambulatory Visit: Payer: Self-pay | Admitting: Family Medicine

## 2021-08-05 DIAGNOSIS — R932 Abnormal findings on diagnostic imaging of liver and biliary tract: Secondary | ICD-10-CM

## 2021-08-05 NOTE — Progress Notes (Signed)
Patient with multiple small hypodensities in the liver. Ordering RUQ [place] for further characterization   Please call to schedule and call patient with appt time, location and date.

## 2021-08-05 NOTE — Telephone Encounter (Signed)
RUQ [place] ordered. Please contact patient with appt details once scheduled for imaging.

## 2021-08-05 NOTE — Telephone Encounter (Signed)
RUQ [place] ordered to follow up abnormal liver CT scan.

## 2021-08-11 NOTE — Telephone Encounter (Signed)
[place] scheduled for 11/29 at 8:30. Confirmed with pt that she is aware of appt. Ottis Stain, CMA d

## 2021-08-12 ENCOUNTER — Ambulatory Visit
Admission: RE | Admit: 2021-08-12 | Discharge: 2021-08-12 | Disposition: A | Discharge status: Home / Self Care | Payer: Medicare HMO | Source: Ambulatory Visit | Attending: Family Medicine | Admitting: Family Medicine

## 2021-08-12 DIAGNOSIS — K760 Fatty (change of) liver, not elsewhere classified: Secondary | ICD-10-CM | POA: Diagnosis not present

## 2021-08-12 DIAGNOSIS — K8020 Calculus of gallbladder without cholecystitis without obstruction: Secondary | ICD-10-CM | POA: Diagnosis not present

## 2021-08-12 DIAGNOSIS — R932 Abnormal findings on diagnostic imaging of liver and biliary tract: Secondary | ICD-10-CM

## 2021-08-12 NOTE — Addendum Note (Signed)
Addended by: Adolph Pollack on: 08/12/2021 07:06 PM   Modules accepted: Orders

## 2021-08-12 NOTE — Telephone Encounter (Signed)
Patient returns call to nurse line and expresses frustration that she was not informed of reasoning for RUQ ultrasound. Patient states that she was unaware of any abnormal findings with her liver.   Patient is also asking if any further follow up is needed for mass in ovary.   Please return call to patient to discuss further.   Talbot Grumbling, RN

## 2021-08-12 NOTE — Telephone Encounter (Signed)
Called patient to clarify results of CT and reasoning for RUQ ultrasound. Patient voiced understanding. Patient also confirmed she has not heard from OB/GYN so discussed placing a referral for follow up regarding the ovarian mass. Advised patient to contact our office if she was not contacted to schedule an appt within the next two weeks. Patient voiced understanding.  Referral to OB/GYN submitted.  Awaiting results of RUQ [place].   Eulis Foster, MD [place], PGY-3 [phone]

## 2021-08-15 ENCOUNTER — Encounter: Payer: Self-pay | Admitting: Family Medicine

## 2021-08-18 ENCOUNTER — Telehealth: Payer: Self-pay

## 2021-08-18 NOTE — Telephone Encounter (Signed)
Patient calls nurse line reporting continued yeast infections with Jardiance. Patient reports she would like to decrease back to 10mg . Patient reports she would like to be evaluated for yeast.   Patient scheduled for evaluation on 12/6.

## 2021-08-19 ENCOUNTER — Other Ambulatory Visit: Payer: Self-pay

## 2021-08-19 ENCOUNTER — Encounter: Payer: Self-pay | Admitting: Family Medicine

## 2021-08-19 ENCOUNTER — Ambulatory Visit (INDEPENDENT_AMBULATORY_CARE_PROVIDER_SITE_OTHER): Payer: Medicare HMO | Admitting: Family Medicine

## 2021-08-19 VITALS — BP 140/68 | HR 88 | Wt 206.1 lb

## 2021-08-19 DIAGNOSIS — I129 Hypertensive chronic kidney disease with stage 1 through stage 4 chronic kidney disease, or unspecified chronic kidney disease: Secondary | ICD-10-CM

## 2021-08-19 DIAGNOSIS — N898 Other specified noninflammatory disorders of vagina: Secondary | ICD-10-CM

## 2021-08-19 DIAGNOSIS — N183 Chronic kidney disease, stage 3 unspecified: Secondary | ICD-10-CM | POA: Diagnosis not present

## 2021-08-19 DIAGNOSIS — E1122 Type 2 diabetes mellitus with diabetic chronic kidney disease: Secondary | ICD-10-CM

## 2021-08-19 LAB — POCT WET PREP (WET MOUNT)
Clue Cells Wet Prep Whiff POC: NEGATIVE
Trichomonas Wet Prep HPF POC: ABSENT

## 2021-08-19 LAB — POCT GLYCOSYLATED HEMOGLOBIN (HGB A1C): HbA1c, POC (controlled diabetic range): 8.6 % — AB (ref 0.0–7.0)

## 2021-08-19 NOTE — Assessment & Plan Note (Signed)
A1c today of 8.6.  Previously 9.6.  She takes Jardiance 25 mg daily and metformin 1000 mg twice daily.  She request reducing the Jardiance to 10 mg daily due to concern for yeast infections. Discussed ozempic but she wants to hold off for a few weeks due to other health concerns.

## 2021-08-19 NOTE — Progress Notes (Signed)
    SUBJECTIVE:   CHIEF COMPLAINT / HPI:   Vaginal itching: Patient is a 65 y.o. female presenting with vaginal itching for 3-4 days.  She  denies vaginal discharge.  Denies vaginal odor or pain.  She states that she is going to see gynecology soon for her vaginal dryness.   Diabetic Follow Up: Patient is a 65 y.o. female who present today for diabetic follow up.   Patient endorses no problems  Home medications include: Jardiance 25 mg daily, metformin 1000 mg twice daily Patient endorses taking these medications as prescribed.She states that she thinks the Jardiance may be leading to yeast infections and request reducing the dose. She didn't have many yeast infections before switching to 25mg .   Most recent A1Cs:  Lab Results  Component Value Date   HGBA1C 8.6 (A) 08/19/2021   HGBA1C 9.6 (A) 05/20/2021   HGBA1C 8.7 (A) 12/13/2020   Last Microalbumin, LDL, Creatinine: Lab Results  Component Value Date   LDLCALC 40 12/13/2020   CREATININE 1.20 (H) 07/29/2021   Patient does not check blood glucose on a regular basis.  Patient is up to date on diabetic eye. Patient is up to date on diabetic foot exam.   PERTINENT  PMH / PSH: None relevant  OBJECTIVE:   BP 140/68   Pulse 88   Wt 206 lb 2 oz (93.5 kg)   SpO2 98%   BMI 40.26 kg/m    General: NAD, pleasant, able to participate in exam Respiratory: Normal effort, no obvious respiratory distress Pelvic: VULVA: normal appearing vulva with no masses, tenderness or lesions, VAGINA: Normal appearing vagina with normal color, no lesions, with scant discharge present,   Chaperone Alexis present for pelvic exam  ASSESSMENT/PLAN:   Type 2 DM  A1c today of 8.6.  Previously 9.6.  She takes Jardiance 25 mg daily and metformin 1000 mg twice daily.  She request reducing the Jardiance to 10 mg daily due to concern for yeast infections. Discussed ozempic but she wants to hold off for a few weeks due to other health concerns.    Vaginal itching 65 y.o. female with vaginal itching for 3 to 4 days.  She previously took fluconazole about a week ago and it did not seem to improve her symptoms.  Wet prep was performed which showed no signs of yeast, BV, trichomonas.  She is seeing gynecology in about a week or so for vaginal dryness.  Likely secondary to vaginal dryness but the patient needed to leave to catch her ride and so wanted to follow-up after she received the results of her testing.  We will allow her to follow-up with gynecology. Plan: -Wet prep as above. -She will follow-up with gynecology.    Lurline Del, [place]

## 2021-08-20 ENCOUNTER — Telehealth: Payer: Self-pay

## 2021-08-20 DIAGNOSIS — E1122 Type 2 diabetes mellitus with diabetic chronic kidney disease: Secondary | ICD-10-CM

## 2021-08-20 DIAGNOSIS — L304 Erythema intertrigo: Secondary | ICD-10-CM

## 2021-08-20 DIAGNOSIS — N183 Chronic kidney disease, stage 3 unspecified: Secondary | ICD-10-CM

## 2021-08-20 NOTE — Telephone Encounter (Signed)
Patient calls nurse line requesting wet prep results. Patient advised of negative results. Patient reports she is still experiencing a lot of "outside" vaginal itching. Patient is requesting a cream to help with symptoms until she sees GYN.   Will forward to provider who saw patient.

## 2021-08-21 MED ORDER — EMPAGLIFLOZIN 10 MG PO TABS
10.0000 mg | ORAL_TABLET | Freq: Every day | ORAL | 3 refills | Status: DC
Start: 2021-08-21 — End: 2022-10-06

## 2021-08-21 MED ORDER — NYSTATIN-TRIAMCINOLONE 100000-0.1 UNIT/GM-% EX OINT: 1.0000 "application " | TOPICAL_OINTMENT | Freq: Two times a day (BID) | CUTANEOUS | 0 refills | Status: DC

## 2021-08-21 NOTE — Telephone Encounter (Signed)
Called patient and reviewed results from multiple prior visits.  Vaginal Symptoms  Patient reports ongoing irritation and dryness in the external vaginal area.  She has tried a number of over-the-counter agents for this.  Recommended cessation of these.  Prescribed topical combination antifungal and mild steroid to see if we could reduce inflammation potentially treat skin Candida infection.  She is certain that the higher dose of Jardiance is contributing to this.  We discussed at length.  After shared decision-making we elected to reduce her empagliflozin to 10 mg so she can better tolerate this and she is going to get to the gym.  She is moving into an assisted living facility where they have a in facility gym.  She is also not yet heard from the gynecologist.  She was last seen a year ago.  [facility]  The patient is going to call to schedule this.  Ultrasound The patient has not yet heard the results of her ultrasound.  This was sent about a week ago by Dr. Alba Cory.  Reviewed findings with patient.  She does have small gallstones.  She denies symptoms such as right upper quadrant pain or postprandial nausea or vomiting.  Discussed her fatty liver findings.  She reports she drinks a pint or more of vodka on the weekends and drinks beer throughout the week.  We discussed complete cessation.  We discussed ongoing risk including risk of cirrhosis and other complications of excess alcohol use.  Use teach back method to ensure patient has understanding of how to use cream, neck steps with her calling the gynecologist and dose reduction of Jardiance.  Once again encouraged her to stop alcohol.  Patient appreciative of call.  Dorris Singh, MD  Family Medicine Teaching Service

## 2021-08-21 NOTE — Telephone Encounter (Signed)
Pt calling in upset because she hasn't heard anything about her recent tests or cream that dr Vanessa Marathon hasn't sent in. She said she wanted to speak to dr brown about the situation. Please advise. Cherylynn Liszewski Kennon Holter, CMA

## 2021-09-18 ENCOUNTER — Other Ambulatory Visit: Payer: Self-pay

## 2021-09-18 ENCOUNTER — Telehealth: Payer: Self-pay | Admitting: Student-PharmD

## 2021-09-18 ENCOUNTER — Encounter: Payer: Self-pay | Admitting: Family Medicine

## 2021-09-18 ENCOUNTER — Ambulatory Visit (INDEPENDENT_AMBULATORY_CARE_PROVIDER_SITE_OTHER): Payer: Medicare HMO | Admitting: Family Medicine

## 2021-09-18 VITALS — BP 133/80 | HR 75 | Ht 60.0 in | Wt 206.2 lb

## 2021-09-18 DIAGNOSIS — G5601 Carpal tunnel syndrome, right upper limb: Secondary | ICD-10-CM | POA: Diagnosis not present

## 2021-09-18 DIAGNOSIS — E119 Type 2 diabetes mellitus without complications: Secondary | ICD-10-CM | POA: Diagnosis not present

## 2021-09-18 NOTE — Telephone Encounter (Signed)
Called patient to follow up on progress with smoking cessation. She said it is not going well but she was unavailable to talk today, requested I call her back next week.

## 2021-09-18 NOTE — Assessment & Plan Note (Signed)
Presentation consistent with carpal tunnel of right wrist. Known hx of the same. Already using wrist brace nightly without improvement. Moderate severity (symptoms very bothersome, but fortunately no thenar atrophy or weakness). -Referral to hand surgery to discuss surgical options -Continue nightly use of wrist brace

## 2021-09-18 NOTE — Progress Notes (Signed)
° ° °  SUBJECTIVE:   CHIEF COMPLAINT / HPI:   Right Wrist Pain -Ongoing for years -Previously told she has tendonitis, then later told she has carpal tunnel -Has always put it off to discuss other issues at doctor's visit -Pain is now causing difficulty sleeping -Wears a wrist brace for a large portion of the day and night. Doesn't seem to help much -Pain described as pins and needles, tingling sensation throughout wrist and hand, mostly palmar aspect -Tries to shake it out, running it under hot water helps -Worked with her hands-- cash registers, tape guns, etc-- for 21 years   PERTINENT  PMH / PSH: CKD Stage IIIa, HTN, obesity, T2DM, tobacco use (6-8 cigarettes per day), alcohol use (1 pint vodka + 6 beers on the weekend)  OBJECTIVE:   BP 133/80    Pulse 75    Ht 5' (1.524 m)    Wt 206 lb 4 oz (93.6 kg)    SpO2 100%    BMI 40.28 kg/m   General: NAD, pleasant, able to participate in exam Respiratory: No respiratory distress Skin: warm and dry, no rashes noted Psych: Normal affect and mood Neuro: grossly intact MSK:  Wrist/hand: no obvious deformity or swelling, no skin changes. No thenar atrophy. 5/5 strength and normal ROM throughout fingers, hand, and wrist bilaterally No tenderness to palpation. Sensation intact to light touch. +Phalen's test  ASSESSMENT/PLAN:   Carpal tunnel syndrome Presentation consistent with carpal tunnel of right wrist. Known hx of the same. Already using wrist brace nightly without improvement. Moderate severity (symptoms very bothersome, but fortunately no thenar atrophy or weakness). -Referral to hand surgery to discuss surgical options -Continue nightly use of wrist brace    Alcus Dad, MD [place]

## 2021-09-18 NOTE — Patient Instructions (Addendum)
It was great to see you!  Your wrist symptoms are suggestive of carpal tunnel. Continue wearing the wrist brace at night specifically.  I have placed a referral to hand surgery to discuss the possibility of surgical treatment. Sometimes they will require additional testing (nerve studies) prior to surgery, so just be aware of this.  Take care and seek immediate care sooner if you develop any concerns.  Dr. [facility]   Carpal Tunnel Syndrome Carpal tunnel syndrome is a condition that causes pain, weakness, and numbness in your hand and arm. Numbness is when you cannot feel an area in your body. The carpal tunnel is a narrow area that is on the palm side of your wrist. Repeated wrist motion or certain diseases may cause swelling in the tunnel. This swelling can pinch the main nerve in the wrist. This nerve is called the median nerve. What are the causes? This condition may be caused by: Moving your hand and wrist over and over again while doing a task. Injury to the wrist. Arthritis. A sac of fluid (cyst) or abnormal growth (tumor) in the carpal tunnel. Fluid buildup during pregnancy. Use of tools that vibrate. Sometimes the cause is not known. What increases the risk? The following factors may make you more likely to have this condition: Having a job that makes you do these things: Move your hand over and over again. Work with tools that vibrate, such as drills or sanders. Being a woman. Having diabetes, obesity, thyroid problems, or kidney failure. What are the signs or symptoms? Symptoms of this condition include: A tingling feeling in your fingers. Tingling or loss of feeling in your hand. Pain in your entire arm. This pain may get worse when you bend your wrist and elbow for a long time. Pain in your wrist that goes up your arm to your shoulder. Pain that goes down into your palm or fingers. Weakness in your hands. You may find it hard to grab and hold  items. You may feel worse at night. How is this treated? This condition may be treated with: Lifestyle changes. You will be asked to stop or change the activity that caused your problem. Doing exercises and activities that make bones, muscles, and tendons stronger (physical therapy). Learning how to use your hand again (occupational therapy). Medicines for pain and swelling. You may have injections in your wrist. A wrist splint or brace. Surgery. Follow these instructions at home: If you have a splint or brace: Wear the splint or brace as told by your doctor. Take it off only as told by your doctor. Loosen the splint if your fingers: Tingle. Become numb. Turn cold and blue. Keep the splint or brace clean. If the splint or brace is not waterproof: Do not let it get wet. Cover it with a watertight covering when you take a bath or a shower. Managing pain, stiffness, and swelling If told, put ice on the painful area: If you have a removable splint or brace, remove it as told by your doctor. Put ice in a plastic bag. Place a towel between your skin and the bag. Leave the ice on for 20 minutes, 2-3 times per day. Do not fall asleep with the cold pack on your skin. Take off the ice if your skin turns bright red. This is very important. If you cannot feel pain, heat, or cold, you have a greater risk of damage to the area. Move your fingers often to reduce stiffness and swelling.  General instructions Take over-the-counter and prescription medicines only as told by your doctor. Rest your wrist from any activity that may cause pain. If needed, talk with your boss at work about changes that can help your wrist heal. Do exercises as told by your doctor, physical therapist, or occupational therapist. Keep all follow-up visits. Contact a doctor if: You have new symptoms. Medicine does not help your pain. Your symptoms get worse. Get help right away if: You have very bad numbness or tingling  in your wrist or hand. Summary Carpal tunnel syndrome is a condition that causes pain in your hand and arm. It is often caused by repeated wrist motions. Lifestyle changes and medicines are used to treat this problem. Surgery may help in very bad cases. Follow your doctor's instructions about wearing a splint, resting your wrist, keeping follow-up visits, and calling for help. This information is not intended to replace advice given to you by your health care provider. Make sure you discuss any questions you have with your health care provider. Document Revised: 01/11/2020 Document Reviewed: 01/11/2020 Elsevier Patient Education  [place].

## 2021-09-23 ENCOUNTER — Ambulatory Visit: Payer: Medicare HMO | Admitting: Orthopedic Surgery

## 2021-09-25 NOTE — Telephone Encounter (Signed)
Called to follow up on interest with quitting smoking. She said she recently got some bad news and is not ready to quit. She agrees for me to call her in about a month to see if she is interested in quitting then.

## 2021-10-29 ENCOUNTER — Other Ambulatory Visit: Payer: Self-pay

## 2021-10-29 DIAGNOSIS — I10 Essential (primary) hypertension: Secondary | ICD-10-CM

## 2021-10-29 DIAGNOSIS — E1159 Type 2 diabetes mellitus with other circulatory complications: Secondary | ICD-10-CM

## 2021-10-29 MED ORDER — LOSARTAN POTASSIUM 50 MG PO TABS: 50.0000 mg | ORAL_TABLET | Freq: Every day | ORAL | 0 refills | Status: DC

## 2021-10-29 MED ORDER — ATORVASTATIN CALCIUM 40 MG PO TABS: 40.0000 mg | ORAL_TABLET | Freq: Every day | ORAL | 0 refills | Status: DC

## 2021-10-29 MED ORDER — CHLORTHALIDONE 25 MG PO TABS: 25.0000 mg | ORAL_TABLET | Freq: Every day | ORAL | 3 refills | Status: DC

## 2021-11-04 ENCOUNTER — Ambulatory Visit (INDEPENDENT_AMBULATORY_CARE_PROVIDER_SITE_OTHER): Payer: Medicare HMO

## 2021-11-04 ENCOUNTER — Other Ambulatory Visit: Payer: Self-pay

## 2021-11-04 DIAGNOSIS — Z0000 Encounter for general adult medical examination without abnormal findings: Secondary | ICD-10-CM

## 2021-11-04 NOTE — Progress Notes (Signed)
Subjective:   Alexandra Campos is a 66 y.o. female who presents for Medicare Annual (Subsequent) preventive examination.  Patient consented to have virtual visit and was identified by name and date of birth. Method of visit: Telephone  Encounter participants: Patient: Alexandra Campos - located at Home Nurse/Provider: Dorna Bloom - located at [facility] Others (if applicable): NA  Review of Systems: Defer to PCP  Cardiac Risk Factors include: advanced age (>42mn, >>15women);diabetes mellitus;smoking/ tobacco exposure;hypertension  Objective:   Vitals: There were no vitals taken for this visit.  There is no height or weight on file to calculate BMI.  Advanced Directives 11/04/2021 09/18/2021 08/19/2021 05/20/2021 12/13/2020 09/19/2020 04/17/2020  Does Patient Have a Medical Advance Directive? No No No No No No No  Would patient like information on creating a medical advance directive? Yes (MAU/Ambulatory/Procedural Areas - Information given) No - Patient declined No - Patient declined No - Patient declined No - Patient declined No - Patient declined No - Patient declined   Tobacco Social History   Tobacco Use  Smoking Status Every Day   Packs/day: 0.50   Types: Cigarettes   Passive exposure: Current  Smokeless Tobacco Never     Ready to quit: No Counseling given: Yes  Clinical Intake:  Pre-visit preparation completed: Yes  Pain Score: 0-No pain  How often do you need to have someone help you when you read instructions, pamphlets, or other written materials from your doctor or pharmacy?: 2 - Rarely What is the last grade level you completed in school?: High School  Interpreter Needed?: No  Past Medical History:  Diagnosis Date   Arthritis    Diabetes mellitus without complication ([place]    GERD (gastroesophageal reflux disease)    Hyperlipidemia    Hypertension    Past Surgical History:  Procedure Laterality Date   ABDOMINAL AORTOGRAM W/LOWER EXTREMITY N/A 03/15/2020   Procedure:  ABDOMINAL AORTOGRAM W/LOWER EXTREMITY;  Surgeon: DAngelia Mould MD;  [facility];  Service: Cardiovascular;  Laterality: N/A;   ANKLE SURGERY Right    INDUCED ABORTION     LYMPHADENECTOMY     Family History  Problem Relation Age of Onset   Diabetes Mother    Hypertension Mother    Diabetes Father    Hypertension Father    Cancer Father    Diabetes Sister    Diabetes Sister    Diabetes Brother    Hypertension Brother    Colon cancer Brother 553  Diabetes Brother    Diabetes Daughter    Hypertension Daughter    Diabetes Son    Cancer Paternal Uncle    Diabetes Maternal Grandmother    Hypertension Maternal Grandmother    Colon polyps Neg Hx    Esophageal cancer Neg Hx    Rectal cancer Neg Hx    Stomach cancer Neg Hx    Social History   Socioeconomic History   Marital status: Single    Spouse name: Not on file   Number of children: 2   Years of education: 12   Highest education level: 12th grade  Occupational History   Occupation: CNA    Comment: Retired  Tobacco Use   Smoking status: Every Day    Packs/day: 0.50    Types: Cigarettes    Passive exposure: Current   Smokeless tobacco: Never  Vaping Use   Vaping Use: Never used  Substance and Sexual Activity   Alcohol use: Yes    Alcohol/week: 5.0  standard drinks    Types: 5 Cans of beer per week    Comment: 5 drinks/week    Drug use: Not Currently    Types: Marijuana    Comment:  for pain every other day.   Sexual activity: Not Currently  Other Topics Concern   Not on file  Social History Narrative   Patient lives alone in an independent living facility.    Patient walks the parameter daily ~10 minutes.    Patient enjoys seeing her neighbors.    Patient has one son and one daughter who passed away.    Social Determinants of Health   Financial Resource Strain: Low Risk    Difficulty of Paying Living Expenses: Not very hard  Food Insecurity: No Food Insecurity   Worried About  Charity fundraiser in the Last Year: Never true   Ran Out of Food in the Last Year: Never true  Transportation Needs: No Transportation Needs   Lack of Transportation (Medical): No   Lack of Transportation (Non-Medical): No  Physical Activity: Insufficiently Active   Days of Exercise per Week: 7 days   Minutes of Exercise per Session: 10 min  Stress: Stress Concern Present   Feeling of Stress : To some extent  Social Connections: Moderately Isolated   Frequency of Communication with Friends and Family: More than three times a week   Frequency of Social Gatherings with Friends and Family: More than three times a week   Attends Religious Services: More than 4 times per year   Active Member of [employer] or Organizations: No   Attends Archivist Meetings: Never   Marital Status: Never married   Outpatient Encounter Medications as of 11/04/2021  Medication Sig   Acetaminophen (TYLENOL PO) Take by mouth.   albuterol (VENTOLIN HFA) 108 (90 Base) MCG/ACT inhaler Inhale 2 puffs into the lungs every 4 (four) hours as needed for wheezing or shortness of breath.   atorvastatin (LIPITOR) 40 MG tablet Take 1 tablet (40 mg total) by mouth daily.   blood glucose meter kit and supplies KIT 1 each by Does not apply route daily. Dispense based on patient and insurance preference. Use up to four times daily as directed. (FOR ICD-9 250.00, 250.01).   cetirizine (ZYRTEC) 10 MG tablet Take 1 tablet by mouth once daily   chlorthalidone (HYGROTON) 25 MG tablet Take 1 tablet (25 mg total) by mouth daily.   diclofenac Sodium (VOLTAREN) 1 % GEL APPLY 4 GRAMS  TOPICALLY 4 TIMES DAILY FOR 14 DAYS   empagliflozin (JARDIANCE) 10 MG TABS tablet Take 1 tablet (10 mg total) by mouth daily.   glucose blood (ACCU-CHEK GUIDE) test strip USE 1 STRIP TWICE DAILY FOR BLOOD SUGAR   Lancets Thin MISC 1 each by Does not apply route as needed.   losartan (COZAAR) 50 MG tablet Take 1 tablet (50 mg total) by mouth daily. Needs  appt for future refills.   metFORMIN (GLUCOPHAGE-XR) 500 MG 24 hr tablet Take 2 tablets (1,000 mg total) by mouth in the morning and at bedtime.   Multiple Vitamin (MULTIVITAMIN PO) Take by mouth.   nicotine (NICODERM CQ - DOSED IN MG/24 HOURS) 14 mg/24hr patch Place 1 patch (14 mg total) onto the skin daily.   nystatin-triamcinolone ointment (MYCOLOG) Apply 1 application topically 2 (two) times daily.   omeprazole (PRILOSEC) 20 MG capsule Take 1 capsule by mouth once daily   benzocaine (ORAJEL) 10 % mucosal gel Use as directed 1 application in the mouth  or throat as needed for mouth pain. (Patient not taking: Reported on 09/18/2021)   No facility-administered encounter medications on file as of 11/04/2021.   Activities of Daily Living In your present state of health, do you have any difficulty performing the following activities: 11/04/2021  Hearing? N  Vision? N  Difficulty concentrating or making decisions? N  Walking or climbing stairs? Y  Dressing or bathing? N  Doing errands, shopping? N  Preparing Food and eating ? N  Using the Toilet? N  In the past six months, have you accidently leaked urine? Y  Do you have problems with loss of bowel control? N  Managing your Medications? N  Managing your Finances? N  Housekeeping or managing your Housekeeping? N  Some recent data might be hidden   Patient Care Team: Lattie Haw, MD as PCP - General (Family Medicine)    Assessment:   This is a routine wellness examination for Panzy.  Exercise Activities and Dietary recommendations Current Exercise Habits: Home exercise routine, Type of exercise: walking, Time (Minutes): 10, Frequency (Times/Week): 7, Weekly Exercise (Minutes/Week): 70, Intensity: Mild, Exercise limited by: orthopedic condition(s);cardiac condition(s)   Goals      Quit Smoking     Current 1/2 PPD.       Fall Risk Fall Risk  11/04/2021 09/19/2020 06/10/2020 01/30/2020 01/11/2020  Falls in the past year? '1 1 1 ' 0 0   Number falls in past yr: 1 1 0 0 0  Injury with Fall? 1 1 0 0 -  Risk for fall due to : Impaired balance/gait;Orthopedic patient - - - -  Follow up Falls prevention discussed - Falls evaluation completed - Falls evaluation completed   Patient reports abnormal gait and balance. Patient uses a cane and rolator to ambulate.   Is the patient's home free of loose throw rugs in walkways, pet beds, electrical cords, etc?   yes      Grab bars in the bathroom? yes      Handrails on the stairs?   yes      Adequate lighting?   yes  Patient rating of health (0-10) scale: 7   Depression Screen PHQ 2/9 Scores 11/04/2021 09/18/2021 08/19/2021 05/20/2021  PHQ - 2 Score 0 0 0 0  PHQ- 9 Score '2 2 2 2    ' Cognitive Function 6CIT Screen 11/04/2021  What Year? 0 points  What month? 0 points  What time? 0 points  Count back from 20 0 points  Months in reverse 0 points  Repeat phrase 0 points  Total Score 0   Immunization History  Administered Date(s) Administered   Influenza Inj Mdck Quad Pf 07/07/2018   Influenza,inj,Quad PF,6+ Mos 06/14/2019, 07/03/2020, 05/26/2021   PFIZER Comirnaty(Gray Top)Covid-19 Tri-Sucrose Vaccine 02/26/2021, 07/01/2021   PFIZER(Purple Top)SARS-COV-2 Vaccination 11/23/2019, 12/14/2019, 07/08/2020   PPD Test 05/06/2020, 05/05/2021   Pneumococcal Conjugate-13 07/03/2019   Pneumococcal Polysaccharide-23 07/08/2020   Tdap 07/03/2019   Qualifies for Shingles Vaccine? Yes   Shingrix Completed: No, Education has been provided regarding the importance of this vaccine. Advised may receive this vaccine at local pharmacy or Health Dept. Aware to provide a copy of the vaccination record if obtained from local pharmacy or Health Dept. Verbalized acceptance and understanding.   Screening Tests Health Maintenance  Topic Date Due   Zoster Vaccines- Shingrix (1 of 2) Never done   MAMMOGRAM  07/02/2021   OPHTHALMOLOGY EXAM  08/21/2021   COVID-19 Vaccine (6 - Booster for Pfizer series)  08/26/2021  HEMOGLOBIN A1C  02/17/2022   FOOT EXAM  05/20/2022   PAP SMEAR-Modifier  11/08/2022   COLONOSCOPY (Pts 45-24yr Insurance coverage will need to be confirmed)  12/31/2025   TETANUS/TDAP  07/02/2029   INFLUENZA VACCINE  Completed   Hepatitis C Screening  Completed   HIV Screening  Completed   HPV VACCINES  Aged Out   Cancer Screenings: Lung: Low Dose CT Chest recommended if Age 64-80years, 20 pack-year currently smoking OR have quit w/in 15years. Patient does not qualify. Breast:  Up to date on Mammogram? No  07/03/2019 Up to date of Bone Density/Dexa? NA Colorectal: Yes- 12/31/2020  Additional Screenings: Hepatitis C Screening: Completed  HIV Screening: Completed  Plan:  PCP apt [facility] 3/15. [facility] [phone] [facility] to schedule apt. [phone] I have personally reviewed and noted the following in the patients chart:   Medical and social history Use of alcohol, tobacco or illicit drugs  Current medications and supplements Functional ability and status Nutritional status Physical activity Advanced directives List of other physicians Hospitalizations, surgeries, and ER visits in previous 12 months Vitals Screenings to include cognitive, depression, and falls Referrals and appointments  In addition, I have reviewed and discussed with patient certain preventive protocols, quality metrics, and best practice recommendations. A written personalized care plan for preventive services as well as general preventive health recommendations were provided to patient.  This visit was conducted virtually in the setting of the [place].    [place]orna Bloom [place] 11/11/2021

## 2021-11-06 ENCOUNTER — Telehealth: Payer: Self-pay | Admitting: Student-PharmD

## 2021-11-06 NOTE — Telephone Encounter (Signed)
Called patient to discuss smoking cessation. She said she does not want to talk about quitting smoking any time soon. Will route back to Dr. Valentina Lucks to touch base with her again in 6 months or so, but encouraged her to reach out to [place] if she would like help with quitting smoking in the meantime.

## 2021-11-11 NOTE — Progress Notes (Signed)
I have reviewed this visit and agree with the documentation.   

## 2021-11-11 NOTE — Patient Instructions (Addendum)
You spoke to Alexandra Campos, [place] over the phone for your annual wellness visit.  We discussed goals:   Goals      Quit Smoking     Current 1/2 PPD.       We also discussed recommended health maintenance. As discussed, you are due for: Health Maintenance  Topic Date Due   Zoster Vaccines- Shingrix (1 of 2) Never done   MAMMOGRAM  07/02/2021   OPHTHALMOLOGY EXAM  08/21/2021   COVID-19 Vaccine (6 - Booster for Pfizer series) 08/26/2021   HEMOGLOBIN A1C  02/17/2022   FOOT EXAM  05/20/2022   PAP SMEAR-Modifier  11/08/2022   COLONOSCOPY (Pts 45-56yr Insurance coverage will need to be confirmed)  12/31/2025   TETANUS/TDAP  07/02/2029   INFLUENZA VACCINE  Completed   Hepatitis C Screening  Completed   HIV Screening  Completed   HPV VACCINES  Aged Out   PCP apt scheduled for 3/15. [facility] apt. [phone] [facility] to schedule apt. [phone] We also discussed smoking cessation.  1-800-QUIT-NOW  Preventive Care [id number] Old, Female Preventive care refers to lifestyle choices and visits with your health care provider that can promote health and wellness. Preventive care visits are also called wellness exams. What can I expect for my preventive care visit? Counseling Your health care provider may ask you questions about your: Medical history, including: Past medical problems. Family medical history. Pregnancy history. Current health, including: Menstrual cycle. Method of birth control. Emotional well-being. Home life and relationship well-being. Sexual activity and sexual health. Lifestyle, including: Alcohol, nicotine or tobacco, and drug use. Access to firearms. Diet, exercise, and sleep habits. Work and work [employer] use. Safety issues such as seatbelt and bike helmet use. Physical exam Your health care provider will check your: Height and weight. These may be used to calculate your BMI  (body mass index). BMI is a measurement that tells if you are at a healthy weight. Waist circumference. This measures the distance around your waistline. This measurement also tells if you are at a healthy weight and may help predict your risk of certain diseases, such as type 2 diabetes and high blood pressure. Heart rate and blood pressure. Body temperature. Skin for abnormal spots. What immunizations do I need? Vaccines are usually given at various ages, according to a schedule. Your health care provider will recommend vaccines for you based on your age, medical history, and lifestyle or other factors, such as travel or where you work. What tests do I need? Screening Your health care provider may recommend screening tests for certain conditions. This may include: Lipid and cholesterol levels. Diabetes screening. This is done by checking your blood sugar (glucose) after you have not eaten for a while (fasting). Pelvic exam and Pap test. Hepatitis B test. Hepatitis C test. HIV (human immunodeficiency virus) test. STI (sexually transmitted infection) testing, if you are at risk. Lung cancer screening. Colorectal cancer screening. Mammogram. Talk with your health care provider about when you should start having regular mammograms. This may depend on whether you have a family history of breast cancer. BRCA-related cancer screening. This may be done if you have a family history of breast, ovarian, tubal, or peritoneal cancers. Bone density scan. This is done to screen for osteoporosis. Talk with your health care provider about your test results, treatment options, and if necessary, the need for more tests. Follow these instructions at home: Eating and drinking  Eat a diet  that includes fresh fruits and vegetables, whole grains, lean protein, and low-fat dairy products. Take vitamin and mineral supplements as recommended by your health care provider. Do not drink alcohol if: Your health care  provider tells you not to drink. You are pregnant, may be pregnant, or are planning to become pregnant. If you drink alcohol: Limit how much you have to 0-1 drink a day. Know how much alcohol is in your drink. In the [place] one drink equals one 12 oz bottle of beer (355 mL), one 5 oz glass of wine (148 mL), or one 1 oz glass of hard liquor (44 mL). Lifestyle Brush your teeth every morning and night with fluoride toothpaste. Floss one time each day. Exercise for at least 30 minutes 5 or more days each week. Do not use any products that contain nicotine or tobacco. These products include cigarettes, chewing tobacco, and vaping devices, such as e-cigarettes. If you need help quitting, ask your health care provider. Do not use drugs. If you are sexually active, practice safe sex. Use a condom or other form of protection to prevent STIs. If you do not wish to become pregnant, use a form of birth control. If you plan to become pregnant, see your health care provider for a prepregnancy visit. Take aspirin only as told by your health care provider. Make sure that you understand how much to take and what form to take. Work with your health care provider to find out whether it is safe and beneficial for you to take aspirin daily. Find healthy ways to manage stress, such as: Meditation, yoga, or listening to music. Journaling. Talking to a trusted person. Spending time with friends and family. Minimize exposure to UV radiation to reduce your risk of skin cancer. Safety Always wear your seat belt while driving or riding in a vehicle. Do not drive: If you have been drinking alcohol. Do not ride with someone who has been drinking. When you are tired or distracted. While texting. If you have been using any mind-altering substances or drugs. Wear a helmet and other protective equipment during sports activities. If you have firearms in your house, make sure you follow all gun safety procedures. Seek help  if you have been physically or sexually abused. What's next? Visit your health care provider once a year for an annual wellness visit. Ask your health care provider how often you should have your eyes and teeth checked. Stay up to date on all vaccines. This information is not intended to replace advice given to you by your health care provider. Make sure you discuss any questions you have with your health care provider. Document Revised: 02/26/2021 Document Reviewed: 02/26/2021 Elsevier Patient Education  2022 [place] Prevention in the Home, Adult Falls can cause injuries and can happen to people of all ages. There are many things you can do to make your home safe and to help prevent falls. Ask for help when making these changes. What actions can I take to prevent falls? General Instructions Use good lighting in all rooms. Replace any light bulbs that burn out. Turn on the lights in dark areas. Use night-lights. Keep items that you use often in easy-to-reach places. Lower the shelves around your home if needed. Set up your furniture so you have a clear path. Avoid moving your furniture around. Do not have throw rugs or other things on the floor that can make you trip. Avoid walking on wet floors. If any of your floors are  uneven, fix them. Add color or contrast paint or tape to clearly mark and help you see: Grab bars or handrails. First and last steps of staircases. Where the edge of each step is. If you use a stepladder: Make sure that it is fully opened. Do not climb a closed stepladder. Make sure the sides of the stepladder are locked in place. Ask someone to hold the stepladder while you use it. Know where your pets are when moving through your home. What can I do in the bathroom?   Keep the floor dry. Clean up any water on the floor right away. Remove soap buildup in the tub or shower. Use nonskid mats or decals on the floor of the tub or shower. Attach bath mats  securely with double-sided, nonslip rug tape. If you need to sit down in the shower, use a plastic, nonslip stool. Install grab bars by the toilet and in the tub and shower. Do not use towel bars as grab bars. What can I do in the bedroom? Make sure that you have a light by your bed that is easy to reach. Do not use any sheets or blankets for your bed that hang to the floor. Have a firm chair with side arms that you can use for support when you get dressed. What can I do in the kitchen? Clean up any spills right away. If you need to reach something above you, use a step stool with a grab bar. Keep electrical cords out of the way. Do not use floor polish or wax that makes floors slippery. What can I do with my stairs? Do not leave any items on the stairs. Make sure that you have a light switch at the top and the bottom of the stairs. Make sure that there are handrails on both sides of the stairs. Fix handrails that are broken or loose. Install nonslip stair treads on all your stairs. Avoid having throw rugs at the top or bottom of the stairs. Choose a carpet that does not hide the edge of the steps on the stairs. Check carpeting to make sure that it is firmly attached to the stairs. Fix carpet that is loose or worn. What can I do on the outside of my home? Use bright outdoor lighting. Fix the edges of walkways and driveways and fix any cracks. Remove anything that might make you trip as you walk through a door, such as a raised step or threshold. Trim any bushes or trees on paths to your home. Check to see if handrails are loose or broken and that both sides of all steps have handrails. Install guardrails along the edges of any raised decks and porches. Clear paths of anything that can make you trip, such as tools or rocks. Have leaves, snow, or ice cleared regularly. Use sand or salt on paths during winter. Clean up any spills in your garage right away. This includes grease or oil  spills. What other actions can I take? Wear shoes that: Have a low heel. Do not wear high heels. Have rubber bottoms. Feel good on your feet and fit well. Are closed at the toe. Do not wear open-toe sandals. Use tools that help you move around if needed. These include: Canes. Walkers. Scooters. Crutches. Review your medicines with your doctor. Some medicines can make you feel dizzy. This can increase your chance of falling. Ask your doctor what else you can do to help prevent falls. Where to find more information Centers for Disease  Control and Prevention, [place]: http://www.wolf.info/ National Institute on Aging: http://kim-miller.com/ Contact a doctor if: You are afraid of falling at home. You feel weak, drowsy, or dizzy at home. You fall at home. Summary There are many simple things that you can do to make your home safe and to help prevent falls. Ways to make your home safe include removing things that can make you trip and installing grab bars in the bathroom. Ask for help when making these changes in your home. This information is not intended to replace advice given to you by your health care provider. Make sure you discuss any questions you have with your health care provider. Document Revised: 04/03/2020 Document Reviewed: 04/03/2020 Elsevier Patient Education  2022 [employer].   Our clinic's number is [phone]. Please call with questions or concerns about what we discussed today.

## 2021-11-13 ENCOUNTER — Other Ambulatory Visit: Payer: Self-pay

## 2021-11-13 DIAGNOSIS — L304 Erythema intertrigo: Secondary | ICD-10-CM

## 2021-11-13 MED ORDER — NYSTATIN-TRIAMCINOLONE 100000-0.1 UNIT/GM-% EX OINT: 1.0000 "application " | TOPICAL_OINTMENT | Freq: Two times a day (BID) | CUTANEOUS | 0 refills | Status: DC

## 2021-11-26 ENCOUNTER — Encounter: Payer: Self-pay | Admitting: Family Medicine

## 2021-11-26 ENCOUNTER — Other Ambulatory Visit: Payer: Self-pay

## 2021-11-26 ENCOUNTER — Ambulatory Visit (INDEPENDENT_AMBULATORY_CARE_PROVIDER_SITE_OTHER): Payer: Medicare HMO | Admitting: Family Medicine

## 2021-11-26 VITALS — BP 132/78 | HR 85 | Wt 203.8 lb

## 2021-11-26 DIAGNOSIS — E7800 Pure hypercholesterolemia, unspecified: Secondary | ICD-10-CM | POA: Diagnosis not present

## 2021-11-26 DIAGNOSIS — E1122 Type 2 diabetes mellitus with diabetic chronic kidney disease: Secondary | ICD-10-CM | POA: Diagnosis not present

## 2021-11-26 DIAGNOSIS — Z0000 Encounter for general adult medical examination without abnormal findings: Secondary | ICD-10-CM

## 2021-11-26 DIAGNOSIS — Z1231 Encounter for screening mammogram for malignant neoplasm of breast: Secondary | ICD-10-CM | POA: Diagnosis not present

## 2021-11-26 DIAGNOSIS — I129 Hypertensive chronic kidney disease with stage 1 through stage 4 chronic kidney disease, or unspecified chronic kidney disease: Secondary | ICD-10-CM | POA: Diagnosis not present

## 2021-11-26 DIAGNOSIS — F17200 Nicotine dependence, unspecified, uncomplicated: Secondary | ICD-10-CM

## 2021-11-26 DIAGNOSIS — N183 Chronic kidney disease, stage 3 unspecified: Secondary | ICD-10-CM

## 2021-11-26 DIAGNOSIS — I10 Essential (primary) hypertension: Secondary | ICD-10-CM

## 2021-11-26 LAB — POCT GLYCOSYLATED HEMOGLOBIN (HGB A1C): HbA1c, POC (controlled diabetic range): 8.9 % — AB (ref 0.0–7.0)

## 2021-11-26 MED ORDER — OZEMPIC (0.25 OR 0.5 MG/DOSE) 2 MG/1.5ML ~~LOC~~ SOPN: 0.2500 mg | PEN_INJECTOR | SUBCUTANEOUS | 0 refills | Status: AC

## 2021-11-26 MED ORDER — NICOTINE 14 MG/24HR TD PT24: 14.0000 mg | MEDICATED_PATCH | Freq: Every day | TRANSDERMAL | 1 refills | Status: DC

## 2021-11-26 NOTE — Progress Notes (Signed)
? ? ? ? ?  SUBJECTIVE:  ? ?CHIEF COMPLAINT / HPI:  ? ?Alexandra Campos is a 66 y.o. female presents for diabetic follow up ? ?Diabetes ?Patient's current diabetic medications include jardiance, metformin. Tolerating well without side effects.  Patient endorses compliance with these medications. CBG readings averaging in the 130-150.  Patient's last A1c was  ?Lab Results  ?Component Value Date  ? HGBA1C 8.9 (A) 11/26/2021  ? HGBA1C 8.6 (A) 08/19/2021  ? HGBA1C 9.6 (A) 05/20/2021  ?Denies abdominal pain, blurred vision, polyuria, polydipsia, hypoglycemia. Patient states they understand that diet and exercise can help with her diabetes. Pt does not want to start jardiance '25mg'$  due to hx of yeast infection on this.  ? ?Last Microalbumin, LDL, Creatinine: ?Lab Results  ?Component Value Date  ? [place] 40 11/26/2021  ? CREATININE 1.06 (H) 11/26/2021  ?  ?Hypertension ?Patient's current antihypertensive  medications include: chlorthalidone and losartan. Compliant with medications and tolerating well without side effects. BP machine broken, BP when she is able to check is 120s.  Denies any SOB, CP, vision changes, LE edema, medication SEs, or symptoms of hypotension.  ? ?Most recent creatinine trend:  ?Lab Results  ?Component Value Date  ? CREATININE 1.06 (H) 11/26/2021  ? CREATININE 1.20 (H) 07/29/2021  ? CREATININE 1.09 (H) 12/13/2020  ?  ?Patient has had a BMP in the past 1 year. ? ?Tobacco use disorder ?Reports increase in to 10 a day due to recent stressors, she was previously down to 4 a day 5-6 months ago. Nicotine patch helps a little.  ?  ?PERTINENT  PMH / PSH: T2DM, HTN, CKD, tobacco use disorder  ? ?OBJECTIVE:  ? ?BP 132/78   Pulse 85   Wt 203 lb 12.8 oz (92.4 kg)   SpO2 95%   BMI 39.80 kg/m?   ? ?General: Alert, no acute distress ?Cardio: well perfused ?Pulm: normal work of breathing ?Neuro: Cranial nerves grossly intact  ? ?ASSESSMENT/PLAN:  ? ?Hypertension ?Bp at goal. Continue losartan and  chlorthalidone. ? ?Healthcare maintenance ?Ordered mammogram. Obtained CMP and lipid panel today.  ? ?Type 2 DM with CKD stage 3 and hypertension ([place]) ?A1c 8.9, slightly worse than previous which was 8.6. will not increase Jardiance to '25mg'$  as pt has already tried this and experienced side effects. Will start ozempic 0.'25mg'$  weekly. Follow up in one month and I can increase dose as necessary. Referred to opthalmology for diabetic eye exam.  ? ?Tobacco dependence ?Discussed reducing smoking and the harmful effects of smoking. Pt is open to trying to quit. Sent in nicotine patches to the pharmacy. Follow up in 1-2 months. ?  ? ?Lattie Haw, MD ?[place]   ? ? ?

## 2021-11-26 NOTE — Patient Instructions (Addendum)
Thank you for coming to see me today. It was a pleasure. Today we discussed your diabetes, it is stable but I would like it lower, starting you on ozempic 0.'25mg'$  every week. ? ?Sent in nicotine patches. ? ?Booked mammogram. ? ?I recommend Glucosamine and chondroitin supplements which can help with your knee.  ? ?Please follow-up with me in 4 weeks  ? ?If you have any questions or concerns, please do not hesitate to call the office at [phone]. ? ?Best wishes,  ? ?Dr Posey Pronto   ?

## 2021-11-27 LAB — COMPREHENSIVE METABOLIC PANEL
ALT: 14 IU/L (ref 0–32)
AST: 15 IU/L (ref 0–40)
Albumin/Globulin Ratio: 1.4 (ref 1.2–2.2)
Albumin: 4.3 g/dL (ref 3.8–4.8)
Alkaline Phosphatase: 91 IU/L (ref 44–121)
BUN/Creatinine Ratio: 18 (ref 12–28)
BUN: 19 mg/dL (ref 8–27)
Bilirubin Total: 0.3 mg/dL (ref 0.0–1.2)
CO2: 24 mmol/L (ref 20–29)
Calcium: 9.8 mg/dL (ref 8.7–10.3)
Chloride: 101 mmol/L (ref 96–106)
Creatinine, Ser: 1.06 mg/dL — ABNORMAL HIGH (ref 0.57–1.00)
Globulin, Total: 3 g/dL (ref 1.5–4.5)
Glucose: 181 mg/dL — ABNORMAL HIGH (ref 70–99)
Potassium: 4.2 mmol/L (ref 3.5–5.2)
Sodium: 141 mmol/L (ref 134–144)
Total Protein: 7.3 g/dL (ref 6.0–8.5)
eGFR: 59 mL/min/{1.73_m2} — ABNORMAL LOW (ref >59)

## 2021-11-27 LAB — LIPID PANEL
Chol/HDL Ratio: 2.1 ratio (ref 0.0–4.4)
Cholesterol, Total: 112 mg/dL (ref 100–199)
HDL: 54 mg/dL (ref >39)
LDL Chol Calc (NIH): 40 mg/dL (ref 0–99)
Triglycerides: 98 mg/dL (ref 0–149)
VLDL Cholesterol Cal: 18 mg/dL (ref 5–40)

## 2021-11-30 NOTE — Assessment & Plan Note (Signed)
>>  ASSESSMENT AND PLAN FOR TOBACCO DEPENDENCE WRITTEN ON 11/30/2021  1:41 AM BY PATEL, POONAM, MD  Discussed reducing smoking and the harmful effects of smoking. Pt is open to trying to quit. Sent in nicotine  patches to the pharmacy. Follow up in 1-2 months.

## 2021-11-30 NOTE — Assessment & Plan Note (Signed)
A1c 8.9, slightly worse than previous which was 8.6. will not increase Jardiance to '25mg'$  as pt has already tried this and experienced side effects. Will start ozempic 0.'25mg'$  weekly. Follow up in one month and I can increase dose as necessary. Referred to opthalmology for diabetic eye exam.  ?

## 2021-11-30 NOTE — Assessment & Plan Note (Signed)
Ordered mammogram. Obtained CMP and lipid panel today.  ?

## 2021-11-30 NOTE — Assessment & Plan Note (Signed)
Discussed reducing smoking and the harmful effects of smoking. Pt is open to trying to quit. Sent in nicotine patches to the pharmacy. Follow up in 1-2 months. ?

## 2021-11-30 NOTE — Assessment & Plan Note (Signed)
Bp at goal. Continue losartan and chlorthalidone. ?

## 2021-12-04 ENCOUNTER — Telehealth: Payer: Self-pay | Admitting: *Deleted

## 2021-12-04 NOTE — Telephone Encounter (Signed)
Patient wants to know if she is to continue Jardiance since she is starting Ozempic. ? ?To PCP.  Christen Bame, CMA ? ?

## 2021-12-04 NOTE — Telephone Encounter (Signed)
Pt informed.  Alexandra Campos, CMA  

## 2021-12-06 ENCOUNTER — Other Ambulatory Visit: Payer: Self-pay

## 2021-12-06 ENCOUNTER — Ambulatory Visit
Admission: EM | Admit: 2021-12-06 | Discharge: 2021-12-06 | Disposition: A | Discharge status: Home / Self Care | Payer: Medicare HMO | Attending: Physician Assistant | Admitting: Physician Assistant

## 2021-12-06 ENCOUNTER — Encounter: Payer: Self-pay | Admitting: *Deleted

## 2021-12-06 DIAGNOSIS — H5789 Other specified disorders of eye and adnexa: Secondary | ICD-10-CM

## 2021-12-06 DIAGNOSIS — Z789 Other specified health status: Secondary | ICD-10-CM

## 2021-12-06 MED ORDER — AMOXICILLIN-POT CLAVULANATE 875-125 MG PO TABS
1.0000 | ORAL_TABLET | Freq: Two times a day (BID) | ORAL | 0 refills | Status: AC
Start: 2021-12-06 — End: 2021-12-16

## 2021-12-06 MED ORDER — CETIRIZINE HCL 10 MG PO TABS: 10.0000 mg | ORAL_TABLET | Freq: Every day | ORAL | 0 refills | Status: AC

## 2021-12-06 NOTE — ED Provider Notes (Signed)
?[facility] ? ? ? ?CSN: [id number] ?Arrival date & time: 12/06/21  0801 ? ? ?  ? ?History   ?Chief Complaint ?Chief Complaint  ?Patient presents with  ? Eye swelling  ? ? ?HPI ?Alexandra Campos is a 66 y.o. female.  ? ?Patient presents today with a 2-day history of periorbital swelling.  This initially began on the left but has spread to the right.  She reports associated pruritus but denies any ocular pain, visual disturbance, nausea, vomiting, headache, fever, fatigue, malaise.  She wears glasses but does not wear contacts.  She denies any chemical or fine particulate matter exposure.  She does take an allergy medication but denies formal diagnosis of seasonal allergies.  She does report occasional nasal congestion and cough but denies recent illness or additional symptoms.  She denies any recent antibiotic use.  She is type II diabetic with uncontrolled blood sugars with last A1c 8.9% 11/26/2021.  She has not tried any over-the-counter medication for symptom management. ? ? ?Past Medical History:  ?Diagnosis Date  ? Arthritis   ? Diabetes mellitus without complication ([place])   ? GERD (gastroesophageal reflux disease)   ? Hyperlipidemia   ? Hypertension   ? ? ?Patient Active Problem List  ? Diagnosis Date Noted  ? Wheezing 12/13/2020  ? Low back pain 09/22/2020  ? Yeast infection involving the vagina and surrounding area 06/10/2020  ? Complex cyst of right ovary 04/09/2020  ? Atherosclerosis of artery of extremity with rest pain ([place]) 03/15/2020  ? Ovarian cyst 03/09/2020  ? Postmenopausal bleeding 02/07/2020  ? Abnormal ankle brachial index (ABI) 01/14/2020  ? Abnormal facial hair 11/24/2019  ? Stress incontinence of urine 10/03/2019  ? Type 2 DM with CKD stage 3 and hypertension ([place]) 07/03/2019  ? Class 3 severe obesity with body mass index (BMI) of 40.0 to 44.9 in adult [facility]) 06/16/2019  ? CKD (chronic kidney disease) stage 3, GFR 30-59 ml/min ([facility]) 06/16/2019  ? Healthcare maintenance 06/16/2019  ?  Carpal tunnel syndrome 06/16/2019  ? Hypertension 08/04/2018  ? Tobacco dependence 08/04/2018  ? ? ?Past Surgical History:  ?Procedure Laterality Date  ? ABDOMINAL AORTOGRAM W/LOWER EXTREMITY N/A 03/15/2020  ? Procedure: ABDOMINAL AORTOGRAM W/LOWER EXTREMITY;  Surgeon: Angelia Mould, MD;  Location: [place] [facility];  Service: Cardiovascular;  Laterality: N/A;  ? ANKLE SURGERY Right   ? INDUCED ABORTION    ? LYMPHADENECTOMY    ? ? ?OB History   ?No obstetric history on file. ?  ? ? ? ?Home Medications   ? ?Prior to Admission medications   ?Medication Sig Start Date End Date Taking? Authorizing Provider  ?amoxicillin-clavulanate (AUGMENTIN) 875-125 MG tablet Take 1 tablet by mouth every 12 (twelve) hours for 10 days. 12/06/21 12/16/21 Yes Pegah Segel K, PA-C  ?atorvastatin (LIPITOR) 40 MG tablet Take 1 tablet (40 mg total) by mouth daily. 10/29/21  Yes Lattie Haw, MD  ?chlorthalidone (HYGROTON) 25 MG tablet Take 1 tablet (25 mg total) by mouth daily. 10/29/21  Yes Lattie Haw, MD  ?diclofenac Sodium (VOLTAREN) 1 % GEL APPLY 4 GRAMS  TOPICALLY 4 TIMES DAILY FOR 14 DAYS 11/12/20  Yes Richarda Osmond, MD  ?empagliflozin (JARDIANCE) 10 MG TABS tablet Take 1 tablet (10 mg total) by mouth daily. 08/21/21  Yes Martyn Malay, MD  ?losartan (COZAAR) 50 MG tablet Take 1 tablet (50 mg total) by mouth daily. Needs appt for future refills. 10/29/21  Yes Lattie Haw, MD  ?metFORMIN (GLUCOPHAGE-XR) 500 MG  24 hr tablet Take 2 tablets (1,000 mg total) by mouth in the morning and at bedtime. 12/13/20  Yes Wilber Oliphant, MD  ?Multiple Vitamin (MULTIVITAMIN PO) Take by mouth.   Yes [provider]  ?nystatin-triamcinolone ointment (MYCOLOG) Apply 1 application topically 2 (two) times daily. 11/13/21  Yes Lattie Haw, MD  ?omeprazole (PRILOSEC) 20 MG capsule Take 1 capsule by mouth once daily 08/01/21  Yes Carollee Leitz, MD  ?Acetaminophen (TYLENOL PO) Take by mouth.    [provider]  ?albuterol (VENTOLIN  HFA) 108 (90 Base) MCG/ACT inhaler Inhale 2 puffs into the lungs every 4 (four) hours as needed for wheezing or shortness of breath. 12/13/20   Wilber Oliphant, MD  ?benzocaine (ORAJEL) 10 % mucosal gel Use as directed 1 application in the mouth or throat as needed for mouth pain. ?Patient not taking: Reported on 09/18/2021 02/26/21   Sharion Settler, DO  ?blood glucose meter kit and supplies KIT 1 each by Does not apply route daily. Dispense based on patient and insurance preference. Use up to four times daily as directed. (FOR ICD-9 250.00, 250.01). 08/17/19   Wilber Oliphant, MD  ?cetirizine (ZYRTEC) 10 MG tablet Take 1 tablet (10 mg total) by mouth daily. 12/06/21   Alicyn Klann K, PA-C  ?glucose blood (ACCU-CHEK GUIDE) test strip USE 1 STRIP TWICE DAILY FOR BLOOD SUGAR 05/22/21   Lattie Haw, MD  ?Lancets Thin MISC 1 each by Does not apply route as needed. 06/10/20   Wilber Oliphant, MD  ?Semaglutide,0.25 or 0.5MG/DOS, (OZEMPIC, 0.25 OR 0.5 MG/DOSE,) 2 MG/1.5ML SOPN Inject 0.25 mg into the skin once a week for 28 days. 11/26/21 12/24/21  Lattie Haw, MD  ? ? ?Family History ?Family History  ?Problem Relation Age of Onset  ? Diabetes Mother   ? Hypertension Mother   ? Diabetes Father   ? Hypertension Father   ? Cancer Father   ? Diabetes Sister   ? Diabetes Sister   ? Diabetes Brother   ? Hypertension Brother   ? Colon cancer Brother 58  ? Diabetes Brother   ? Diabetes Daughter   ? Hypertension Daughter   ? Diabetes Son   ? Cancer Paternal Uncle   ? Diabetes Maternal Grandmother   ? Hypertension Maternal Grandmother   ? Colon polyps Neg Hx   ? Esophageal cancer Neg Hx   ? Rectal cancer Neg Hx   ? Stomach cancer Neg Hx   ? ? ?Social History ?Social History  ? ?Tobacco Use  ? Smoking status: Every Day  ?  Packs/day: 0.50  ?  Types: Cigarettes  ?  Passive exposure: Current  ? Smokeless tobacco: Never  ?Vaping Use  ? Vaping Use: Never used  ?Substance Use Topics  ? Alcohol use: Yes  ?  Alcohol/week: 5.0 standard drinks  ?   Types: 5 Cans of beer per week  ?  Comment: 5 drinks/week   ? Drug use: Yes  ?  Types: Marijuana  ? ? ? ?Allergies   ?Patient has no known allergies. ? ? ?Review of Systems ?Review of Systems  ?Constitutional:  Positive for activity change. Negative for appetite change, fatigue and fever.  ?HENT:  Positive for congestion. Negative for sinus pressure, sneezing and sore throat.   ?Eyes:  Positive for itching. Negative for photophobia, pain, discharge, redness and visual disturbance.  ?Respiratory:  Positive for cough. Negative for shortness of breath.   ?Cardiovascular:  Negative for chest pain.  ?Gastrointestinal:  Negative  for abdominal pain, diarrhea, nausea and vomiting.  ?Neurological:  Negative for dizziness, light-headedness and headaches.  ? ? ?Physical Exam ?Triage Vital Signs ?ED Triage Vitals  ?Enc Vitals Group  ?   BP 12/06/21 0810 (!) 157/94  ?   Pulse Rate 12/06/21 0810 75  ?   Resp 12/06/21 0810 (!) 22  ?   Temp 12/06/21 0810 97.9 ?F (36.6 ?C)  ?   Temp Source 12/06/21 0810 Oral  ?   SpO2 12/06/21 0810 98 %  ?   Weight --   ?   Height --   ?   Head Circumference --   ?   Peak Flow --   ?   Pain Score 12/06/21 0812 0  ?   Pain Loc --   ?   Pain Edu? --   ?   Excl. in [place]? --   ? ?No data found. ? ?Updated Vital Signs ?BP (!) 157/94 Comment: states has not taken HTN med yet this AM  Pulse 75   Temp 97.9 ?F (36.6 ?C) (Oral)   Resp (!) 22   SpO2 98%  ? ?Visual Acuity ?Right Eye Distance: 20/25 with correction ?Left Eye Distance: 20/25 -1 with correction ?Bilateral Distance: 20/20 with correction ? ?Right Eye Near:   ?Left Eye Near:    ?Bilateral Near:    ? ?Physical Exam ?Vitals reviewed.  ?Constitutional:   ?   General: She is awake. She is not in acute distress. ?   Appearance: Normal appearance. She is well-developed. She is not ill-appearing.  ?   Comments: Very pleasant female appears stated age in no acute distress sitting comfortably in exam room  ?HENT:  ?   Head: Normocephalic and atraumatic.  ?    Nose: Nose normal.  ?   Right Sinus: No maxillary sinus tenderness or frontal sinus tenderness.  ?   Left Sinus: No maxillary sinus tenderness or frontal sinus tenderness.  ?   Mouth/Throat:  ?   Pharynx: Uv

## 2021-12-06 NOTE — ED Triage Notes (Signed)
Started with left periorbital eye swelling onset 2 days ago; this AM also started with right periorbital swelling. Denies vision changes. Denies eye drainage; c/o eye pruritis. ?

## 2021-12-06 NOTE — Discharge Instructions (Addendum)
I believe that your symptoms are related to allergies.  Please take Zyrtec daily.  I also recommend use Flonase available over-the-counter to help with congestion symptoms.  Start Augmentin to cover for infection.  Use warm compresses on your eye.  If your symptoms worsen anyway you need to go to the emergency room including vision change, eye pain, headache, fever, nausea, vomiting.  If your symptoms or not improving please follow-up with your ophthalmologist; call to schedule an appointment. ?

## 2021-12-12 DIAGNOSIS — H269 Unspecified cataract: Secondary | ICD-10-CM | POA: Diagnosis not present

## 2021-12-12 DIAGNOSIS — N1831 Chronic kidney disease, stage 3a: Secondary | ICD-10-CM | POA: Diagnosis not present

## 2021-12-12 DIAGNOSIS — T7840XA Allergy, unspecified, initial encounter: Secondary | ICD-10-CM | POA: Diagnosis not present

## 2021-12-12 DIAGNOSIS — E1142 Type 2 diabetes mellitus with diabetic polyneuropathy: Secondary | ICD-10-CM | POA: Diagnosis not present

## 2021-12-12 DIAGNOSIS — F17200 Nicotine dependence, unspecified, uncomplicated: Secondary | ICD-10-CM | POA: Diagnosis not present

## 2021-12-12 DIAGNOSIS — I70209 Unspecified atherosclerosis of native arteries of extremities, unspecified extremity: Secondary | ICD-10-CM | POA: Diagnosis not present

## 2021-12-12 DIAGNOSIS — G5600 Carpal tunnel syndrome, unspecified upper limb: Secondary | ICD-10-CM | POA: Diagnosis not present

## 2021-12-12 DIAGNOSIS — E1122 Type 2 diabetes mellitus with diabetic chronic kidney disease: Secondary | ICD-10-CM | POA: Diagnosis not present

## 2021-12-16 ENCOUNTER — Other Ambulatory Visit: Payer: Self-pay

## 2021-12-16 MED ORDER — ALBUTEROL SULFATE HFA 108 (90 BASE) MCG/ACT IN AERS: 2.0000 | INHALATION_SPRAY | RESPIRATORY_TRACT | 0 refills | Status: DC | PRN

## 2021-12-23 ENCOUNTER — Ambulatory Visit
Admission: RE | Admit: 2021-12-23 | Discharge: 2021-12-23 | Disposition: A | Discharge status: Home / Self Care | Payer: Medicare HMO | Source: Ambulatory Visit | Attending: Family Medicine | Admitting: Family Medicine

## 2021-12-23 DIAGNOSIS — Z1231 Encounter for screening mammogram for malignant neoplasm of breast: Secondary | ICD-10-CM

## 2022-01-05 ENCOUNTER — Other Ambulatory Visit: Payer: Self-pay

## 2022-01-05 DIAGNOSIS — L304 Erythema intertrigo: Secondary | ICD-10-CM

## 2022-01-05 MED ORDER — NYSTATIN-TRIAMCINOLONE 100000-0.1 UNIT/GM-% EX OINT: 1.0000 "application " | TOPICAL_OINTMENT | Freq: Two times a day (BID) | CUTANEOUS | 0 refills | Status: DC

## 2022-01-06 NOTE — Progress Notes (Deleted)
     SUBJECTIVE:   CHIEF COMPLAINT / HPI:   Alexandra Campos is a 66 y.o. female presents for ***   ***  [place] Visit from 11/26/2021 in [place]  PHQ-9 Total Score 4        Health Maintenance Due  Topic   Zoster Vaccines- Shingrix (1 of 2)   OPHTHALMOLOGY EXAM       PERTINENT  PMH / PSH:   OBJECTIVE:   There were no vitals taken for this visit.   General: Alert, no acute distress Cardio: Normal S1 and S2, RRR, no r/m/g Pulm: CTAB, normal work of breathing Abdomen: Bowel sounds normal. Abdomen soft and non-tender.  Extremities: No peripheral edema.  Neuro: Cranial nerves grossly intact   ASSESSMENT/PLAN:   No problem-specific Assessment & Plan notes found for this encounter.    Lattie Haw, MD PGY-3 [place]

## 2022-01-07 ENCOUNTER — Ambulatory Visit: Payer: Medicare HMO | Admitting: Family Medicine

## 2022-01-12 DIAGNOSIS — R69 Illness, unspecified: Secondary | ICD-10-CM | POA: Diagnosis not present

## 2022-01-15 DIAGNOSIS — Z0100 Encounter for examination of eyes and vision without abnormal findings: Secondary | ICD-10-CM | POA: Diagnosis not present

## 2022-01-15 DIAGNOSIS — H52203 Unspecified astigmatism, bilateral: Secondary | ICD-10-CM | POA: Diagnosis not present

## 2022-01-15 DIAGNOSIS — H524 Presbyopia: Secondary | ICD-10-CM | POA: Diagnosis not present

## 2022-01-15 DIAGNOSIS — E1136 Type 2 diabetes mellitus with diabetic cataract: Secondary | ICD-10-CM | POA: Diagnosis not present

## 2022-01-15 DIAGNOSIS — H2513 Age-related nuclear cataract, bilateral: Secondary | ICD-10-CM | POA: Diagnosis not present

## 2022-01-15 DIAGNOSIS — H5213 Myopia, bilateral: Secondary | ICD-10-CM | POA: Diagnosis not present

## 2022-01-20 ENCOUNTER — Ambulatory Visit (INDEPENDENT_AMBULATORY_CARE_PROVIDER_SITE_OTHER): Payer: Medicare HMO | Admitting: Family Medicine

## 2022-01-20 ENCOUNTER — Encounter: Payer: Self-pay | Admitting: Family Medicine

## 2022-01-20 ENCOUNTER — Other Ambulatory Visit: Payer: Self-pay | Admitting: Family Medicine

## 2022-01-20 VITALS — BP 146/74 | HR 80 | Ht 60.0 in | Wt 199.8 lb

## 2022-01-20 DIAGNOSIS — E1122 Type 2 diabetes mellitus with diabetic chronic kidney disease: Secondary | ICD-10-CM

## 2022-01-20 DIAGNOSIS — I129 Hypertensive chronic kidney disease with stage 1 through stage 4 chronic kidney disease, or unspecified chronic kidney disease: Secondary | ICD-10-CM

## 2022-01-20 DIAGNOSIS — N183 Chronic kidney disease, stage 3 unspecified: Secondary | ICD-10-CM

## 2022-01-20 DIAGNOSIS — Z8719 Personal history of other diseases of the digestive system: Secondary | ICD-10-CM | POA: Diagnosis not present

## 2022-01-20 DIAGNOSIS — I10 Essential (primary) hypertension: Secondary | ICD-10-CM | POA: Diagnosis not present

## 2022-01-20 DIAGNOSIS — R1011 Right upper quadrant pain: Secondary | ICD-10-CM | POA: Diagnosis not present

## 2022-01-20 DIAGNOSIS — I152 Hypertension secondary to endocrine disorders: Secondary | ICD-10-CM | POA: Diagnosis not present

## 2022-01-20 DIAGNOSIS — Z789 Other specified health status: Secondary | ICD-10-CM | POA: Diagnosis not present

## 2022-01-20 DIAGNOSIS — E1159 Type 2 diabetes mellitus with other circulatory complications: Secondary | ICD-10-CM | POA: Diagnosis not present

## 2022-01-20 LAB — HM DIABETES EYE EXAM

## 2022-01-20 MED ORDER — CHLORTHALIDONE 25 MG PO TABS: 25.0000 mg | ORAL_TABLET | Freq: Every day | ORAL | 3 refills | Status: DC

## 2022-01-20 MED ORDER — OZEMPIC (0.25 OR 0.5 MG/DOSE) 2 MG/1.5ML ~~LOC~~ SOPN: 0.5000 mg | PEN_INJECTOR | SUBCUTANEOUS | 0 refills | Status: DC

## 2022-01-20 MED ORDER — OMEPRAZOLE 20 MG PO CPDR: 20.0000 mg | DELAYED_RELEASE_CAPSULE | Freq: Every day | ORAL | 0 refills | Status: DC

## 2022-01-20 MED ORDER — LOSARTAN POTASSIUM 50 MG PO TABS: 50.0000 mg | ORAL_TABLET | Freq: Every day | ORAL | 0 refills | Status: DC

## 2022-01-20 NOTE — Progress Notes (Addendum)
? ? ? ?  SUBJECTIVE:  ? ?CHIEF COMPLAINT / HPI:  ? ?Alexandra Campos is a 66 y.o. female presents for diabetes follow up ? ?Diabetes, weight loss ?Pt was started on ozempic 0.'25mg'$  on March 13th. Pt is tolerating medication well, denies side effects. Weight today 199lb , weight prior 20lb. Last dose was 1 week ago and she no longer has refills.  Patient has gone to the eye doctor, no signs of diabetic retinopathy. ? ?RUQ pain ?Reports intermittent RUQ pain for the last week. Described as dull ache. 8/10 severity. Triggers include-corn chips and drinking beer while watching sports games.  Water alleviates pain. Ibuprofen helps a little.  Patient is currently asymptomatic. Denies nausea, vomiting, melena, rectal bleeding, dysuria, frequency, urgency or hematuria.   ? ?[place] Office Visit from 01/20/2022 in [place]  ?PHQ-9 Total Score 3  ? ?  ?  ?PERTINENT  PMH / PSH: DM, HTN  ? ?OBJECTIVE:  ? ?BP (!) 146/74   Pulse 80   Ht 5' (1.524 m)   Wt 199 lb 12.8 oz (90.6 kg)   SpO2 100%   BMI 39.02 kg/m?   ? ?General: Alert, no acute distress ?Cardio: Well-perfused ?Pulm: normal work of breathing ?Abdomen: Abdomen non distended, soft nontender, bowel sounds present ?Neuro: Cranial nerves grossly intact  ? ?ASSESSMENT/PLAN:  ? ?Hypertension ?Bp slightly elevated today at 170/88. Repeat 145/85. Likely elevated in the setting of increased beer and chips intake.  Diet and exercise counseling provided.  Will continue to monitor BP. ? ?Right upper quadrant pain ?Right upper quadrant pain likely 2/2 biliary colic in setting of poor diet with known cholelithiasis. Pt is asymptomatic today. Low suspicion for acute cholecystitis and negative Murphy sign on exam today which is reassuring. Additionally pt is afebrile. Recommended pt avoid known food triggers. Obtained CMP today to check liver function. Follow up if persistent sx. Strict ER precautions given to pt. ? ?Type 2 DM with CKD stage 3 and  hypertension ([place]) ?4lb weight loss since last visit, doing well. Increased ozempic to 0.'5mg'$  weekly. Follow up in 4 weeks. ?  ? ?Lattie Haw, MD PGY-3 ?[place]  ? ?

## 2022-01-20 NOTE — Patient Instructions (Addendum)
Thank you for coming to see me today. It was a pleasure. Today we discussed the ozempic, congrats on 4lb weight loss. I recommend increasing the dose to 0.'5mg'$ . ? ?The right sided belly pain could be gallstones, I am ordering a blood test to check liver function to make sure your liver is okay.  Please avoid any triggers such as corn chips, fried/fatty foods.  Come back and see [place] if you get worsening pain, fevers, nausea vomiting etc. ? ? ? ?Please follow-up with me in 4 weeks  ? ?If you have any questions or concerns, please do not hesitate to call the office at [phone]. ? ?Best wishes,  ? ?Dr Posey Pronto   ?

## 2022-01-21 DIAGNOSIS — R1011 Right upper quadrant pain: Secondary | ICD-10-CM | POA: Insufficient documentation

## 2022-01-21 HISTORY — DX: Right upper quadrant pain: R10.11

## 2022-01-21 LAB — COMPREHENSIVE METABOLIC PANEL
ALT: 12 IU/L (ref 0–32)
AST: 9 IU/L (ref 0–40)
Albumin/Globulin Ratio: 1.4 (ref 1.2–2.2)
Albumin: 4.1 g/dL (ref 3.8–4.8)
Alkaline Phosphatase: 77 IU/L (ref 44–121)
BUN/Creatinine Ratio: 14 (ref 12–28)
BUN: 15 mg/dL (ref 8–27)
Bilirubin Total: 0.2 mg/dL (ref 0.0–1.2)
CO2: 25 mmol/L (ref 20–29)
Calcium: 9.7 mg/dL (ref 8.7–10.3)
Chloride: 105 mmol/L (ref 96–106)
Creatinine, Ser: 1.06 mg/dL — ABNORMAL HIGH (ref 0.57–1.00)
Globulin, Total: 3 g/dL (ref 1.5–4.5)
Glucose: 108 mg/dL — ABNORMAL HIGH (ref 70–99)
Potassium: 3.8 mmol/L (ref 3.5–5.2)
Sodium: 143 mmol/L (ref 134–144)
Total Protein: 7.1 g/dL (ref 6.0–8.5)
eGFR: 59 mL/min/{1.73_m2} — ABNORMAL LOW (ref >59)

## 2022-01-21 NOTE — Assessment & Plan Note (Addendum)
Right upper quadrant pain likely 2/2 biliary colic in setting of poor diet with known cholelithiasis. Pt is asymptomatic today. Low suspicion for acute cholecystitis and negative Murphy sign on exam today which is reassuring. Additionally pt is afebrile. Recommended pt avoid known food triggers. Obtained CMP today to check liver function. Follow up if persistent sx. Strict ER precautions given to pt. ?

## 2022-01-21 NOTE — Assessment & Plan Note (Signed)
4lb weight loss since last visit, doing well. Increased ozempic to 0.'5mg'$  weekly. Follow up in 4 weeks. ?

## 2022-01-21 NOTE — Assessment & Plan Note (Signed)
Bp slightly elevated today at 170/88. Repeat 145/85. Likely elevated in the setting of increased beer and chips intake.  Diet and exercise counseling provided.  Will continue to monitor BP. ?

## 2022-01-22 ENCOUNTER — Telehealth: Payer: Self-pay

## 2022-01-22 NOTE — Telephone Encounter (Signed)
I refilled this medication yesterday thanks ?

## 2022-01-22 NOTE — Telephone Encounter (Signed)
The Ozempic 0.25&0.5 '2MG'$ /1.5 ML Pen has changed.  Now Ozempic '2mg'$ /29m. ? ?Please resend Rx for the new Quanity. ? ?SOttis Stain CMA  ?

## 2022-01-27 ENCOUNTER — Other Ambulatory Visit: Payer: Self-pay | Admitting: Family Medicine

## 2022-01-27 DIAGNOSIS — I10 Essential (primary) hypertension: Secondary | ICD-10-CM

## 2022-02-12 ENCOUNTER — Other Ambulatory Visit: Payer: Self-pay

## 2022-02-12 MED ORDER — ACCU-CHEK GUIDE VI STRP: ORAL_STRIP | 0 refills | Status: DC

## 2022-02-16 ENCOUNTER — Telehealth: Payer: Self-pay

## 2022-02-16 NOTE — Telephone Encounter (Signed)
Patient calls nurse line in regards to metformin.   Patient reports she has only been taking '1000mg'$  in the am and '500mg'$  in pm. Patient reports since she has started Ozempic and exercising her sugars have been "well below 200" everyday.   Patient reports she feels great and denies any symptoms of hyperglycemia. Patient advised to continue to monitor her sugars.   Patient advised to schedule a FU with PCP.

## 2022-02-17 NOTE — Telephone Encounter (Signed)
Sounds good. She should follow up in clinic next month for her A1c. Continue ozempic and metformin as is. Thank you.

## 2022-02-27 ENCOUNTER — Telehealth: Payer: Self-pay

## 2022-02-27 NOTE — Telephone Encounter (Signed)
Patient calls nurse line requesting order for new Rollator walker.   Please route back to RN team once order has been placed.   Talbot Grumbling, RN

## 2022-03-03 ENCOUNTER — Other Ambulatory Visit: Payer: Self-pay | Admitting: Family Medicine

## 2022-03-03 DIAGNOSIS — M545 Other chronic pain: Secondary | ICD-10-CM

## 2022-03-03 NOTE — Telephone Encounter (Signed)
Placed order thank you.

## 2022-03-03 NOTE — Telephone Encounter (Signed)
Community message sent to [employer]. Will await response.   Lakecia Deschamps C Jovonne Wilton, RN  

## 2022-03-04 ENCOUNTER — Ambulatory Visit: Payer: Medicare HMO | Admitting: Family Medicine

## 2022-03-05 ENCOUNTER — Other Ambulatory Visit: Payer: Self-pay | Admitting: Family Medicine

## 2022-03-05 ENCOUNTER — Other Ambulatory Visit: Payer: Self-pay

## 2022-03-05 MED ORDER — OZEMPIC (0.25 OR 0.5 MG/DOSE) 2 MG/1.5ML ~~LOC~~ SOPN: 0.5000 mg | PEN_INJECTOR | SUBCUTANEOUS | 0 refills | Status: DC

## 2022-03-10 ENCOUNTER — Other Ambulatory Visit: Payer: Self-pay | Admitting: Family Medicine

## 2022-03-10 ENCOUNTER — Telehealth: Payer: Self-pay

## 2022-03-10 MED ORDER — SEMAGLUTIDE(0.25 OR 0.5MG/DOS) 2 MG/3ML ~~LOC~~ SOPN: 0.5000 mg | PEN_INJECTOR | SUBCUTANEOUS | 2 refills | Status: DC

## 2022-03-10 NOTE — Telephone Encounter (Signed)
Sent new prescription in. thanks

## 2022-03-16 NOTE — Telephone Encounter (Signed)
Contacted by patients insurance.   DME needs a PA.   This has been filled out and signed by Hensel.   Faxed to [facility].

## 2022-03-26 ENCOUNTER — Other Ambulatory Visit: Payer: Self-pay | Admitting: *Deleted

## 2022-03-26 ENCOUNTER — Encounter: Payer: Medicare HMO | Admitting: Obstetrics & Gynecology

## 2022-03-30 MED ORDER — METFORMIN HCL ER 500 MG PO TB24: 1000.0000 mg | ORAL_TABLET | Freq: Two times a day (BID) | ORAL | 3 refills | Status: DC

## 2022-04-20 ENCOUNTER — Ambulatory Visit (INDEPENDENT_AMBULATORY_CARE_PROVIDER_SITE_OTHER): Payer: Medicare HMO | Admitting: Family Medicine

## 2022-04-20 ENCOUNTER — Encounter: Payer: Self-pay | Admitting: Family Medicine

## 2022-04-20 VITALS — BP 134/79 | HR 64 | Ht 60.0 in | Wt 198.4 lb

## 2022-04-20 DIAGNOSIS — R21 Rash and other nonspecific skin eruption: Secondary | ICD-10-CM

## 2022-04-20 HISTORY — DX: Rash and other nonspecific skin eruption: R21

## 2022-04-20 NOTE — Patient Instructions (Signed)
It was great seeing you today!  Today we discussed your rash, this seems to be due to dry skin. Please use an unscented lotion such as aquaphor and apply vaseline to ensure your skin is receiving the moisture it needs. This will make it less itchy so the rash does not spread.   Please follow up at your next scheduled appointment, if anything arises between now and then, please don't hesitate to contact our office.   Thank you for allowing [place] to be a part of your medical care!  Thank you, Dr. Larae Grooms

## 2022-04-20 NOTE — Progress Notes (Signed)
    SUBJECTIVE:   CHIEF COMPLAINT / HPI:   Patient presents with rash on her left arm. Her mom has had bed bugs twice and mom's caregiver has had it as well. She went over to her mother's house and spent the night over a week ago. One of her friend's found a bed bug on her shirt and has not found anymore since. They have not found any at her apartment. She says that her arm was swollen before and now it is better. She has been itching a lot. Denies fever, chills and no one around her has this rash. No new medications. No new use of products.   OBJECTIVE:   BP 134/79   Pulse 64   Ht 5' (1.524 m)   Wt 198 lb 6 oz (90 kg)   SpO2 95%   BMI 38.74 kg/m   General: Patient well-appearing, in no acute distress.  Resp: normal work of breathing, in no acute distress Derm: excoriations noted along left upper extremity without edema, erythema or other skin changes  ASSESSMENT/PLAN:   Rash -excoriations possibly due to bed bugs but seem to be due to atopic dermatitis, discussed appropriate moisturizing regimen including vaseline and unscented lotion   -PHQ-9 score of 3 with negative question 9 reviewed.   Donney Dice, [place]

## 2022-04-20 NOTE — Assessment & Plan Note (Signed)
-  excoriations possibly due to bed bugs but seem to be due to atopic dermatitis, discussed appropriate moisturizing regimen including vaseline and unscented lotion

## 2022-04-23 ENCOUNTER — Other Ambulatory Visit: Payer: Self-pay | Admitting: *Deleted

## 2022-04-23 DIAGNOSIS — I10 Essential (primary) hypertension: Secondary | ICD-10-CM

## 2022-04-23 DIAGNOSIS — Z8719 Personal history of other diseases of the digestive system: Secondary | ICD-10-CM

## 2022-04-23 DIAGNOSIS — E1159 Type 2 diabetes mellitus with other circulatory complications: Secondary | ICD-10-CM

## 2022-04-24 MED ORDER — ATORVASTATIN CALCIUM 40 MG PO TABS: 40.0000 mg | ORAL_TABLET | Freq: Every day | ORAL | 0 refills | Status: DC

## 2022-04-24 MED ORDER — LOSARTAN POTASSIUM 50 MG PO TABS
50.0000 mg | ORAL_TABLET | Freq: Every day | ORAL | 0 refills | Status: DC
Start: 2022-04-24 — End: 2022-07-20

## 2022-04-24 MED ORDER — OMEPRAZOLE 20 MG PO CPDR: 20.0000 mg | DELAYED_RELEASE_CAPSULE | Freq: Every day | ORAL | 0 refills | Status: DC

## 2022-04-27 ENCOUNTER — Ambulatory Visit: Payer: Medicare HMO | Admitting: Student

## 2022-05-07 DIAGNOSIS — R69 Illness, unspecified: Secondary | ICD-10-CM | POA: Diagnosis not present

## 2022-05-14 ENCOUNTER — Telehealth: Payer: Self-pay

## 2022-05-14 NOTE — Telephone Encounter (Signed)
Patient calls nurse line requesting refill on fluconazole tablets. Patient reports that she was prescribed amoxicillin last week by dentist and has developed a yeast infection. Reports vaginal itching and discharge.   She states that she is unable to come in for an appointment this week and would like to have treatment sent in if possible. Requesting that prescription be sent to [facility] on [address].   Patient also wanted to let Dr. Owens Shark know that with current referral she is unable to be seen at the [facility] until the beginning of November. She wanted to make sure provider was okay with her waiting another two months to be seen.   Will forward to PCP.   Talbot Grumbling, RN

## 2022-05-15 ENCOUNTER — Other Ambulatory Visit: Payer: Self-pay | Admitting: Student

## 2022-05-15 MED ORDER — FLUCONAZOLE 100 MG PO TABS: 100.0000 mg | ORAL_TABLET | Freq: Once | ORAL | 0 refills | Status: AC

## 2022-05-15 NOTE — Telephone Encounter (Signed)
Patient called to request Diflucan. She completed amoxicillin course last week and now has symptoms of vaginal candidiasis. I sent in Diflucan x1 to pharmacy.  She also states she is unable to be seen at [facility] for ovarian mass until November 2023, however referral was placed 6 months ago. Of course, I would prefer her to be seen ASAP however she is already established at the practice as she was seen there in 2021. I will call and discuss with patient.  Orvis Brill, DO

## 2022-05-15 NOTE — Telephone Encounter (Signed)
Patient returns call to nurse line. Advised of message per Dr. Owens Shark.   Patient appreciative.   Talbot Grumbling, RN

## 2022-05-22 ENCOUNTER — Other Ambulatory Visit: Payer: Self-pay

## 2022-05-22 DIAGNOSIS — L304 Erythema intertrigo: Secondary | ICD-10-CM

## 2022-05-25 MED ORDER — NYSTATIN-TRIAMCINOLONE 100000-0.1 UNIT/GM-% EX OINT: 1.0000 | TOPICAL_OINTMENT | Freq: Two times a day (BID) | CUTANEOUS | 0 refills | Status: DC

## 2022-05-25 NOTE — Telephone Encounter (Signed)
Second request for this medication. Rickardo Brinegar Zimmerman Rumple, CMA

## 2022-05-28 ENCOUNTER — Telehealth: Payer: Self-pay

## 2022-05-28 NOTE — Telephone Encounter (Signed)
Patient calls nurse line regarding positive COVID test. She reports that symptoms began on Monday, however, did not receive positive test until today. She reports sore throat, cough sneezing and body aches. She denies fever or chills.   Advised patient of supportive measures.   Patient is also asking about antiviral therapy. Advised patient that there are specific criteria and time window for medication. Will forward to PCP.   - Patient is also asking about DME rollator that was placed in June.  Will send message to [employer].   Talbot Grumbling, RN

## 2022-05-30 NOTE — Telephone Encounter (Signed)
Called and discussed COVID results with Ms. Alexandra Campos. She is out of timeframe window for Paxlovid.  Eating and drinking well, but does not have sense of taste. Able to speak in full sentences and using inhaler at home for shortness of breath as needed. No fevers. Advised her to call if she has any concerns. Gave ED return precautions. Patient understood.  Orvis Brill, DO

## 2022-06-08 ENCOUNTER — Telehealth: Payer: Self-pay | Admitting: Student

## 2022-06-08 NOTE — Telephone Encounter (Signed)
Patient called asking about a prescriptions for rollator walker, I sent her to the nurse line. She then called back and said, I did not want you to send me to that line I know how to get there by pressing 3, no one ever answers and no one calls back, she said I want YOU to send a message back.    Patient is wanting to know what is going on with the prescription order for her rollator walker, she said it is supposed to be sent to [facility], she stated she is needing it sent so she can receive the walker.    Please advise.  Thanks, Alexandra Campos

## 2022-06-09 NOTE — Telephone Encounter (Signed)
Spoke with patient.   Patient reports she has not had a phone for ~ 2 months. She believes [employer] "more than likely" tried to call her to set up delivery for walker that was ordered in June.   Will resend order to [employer] for processing.   Nothing further is needed.

## 2022-06-11 ENCOUNTER — Other Ambulatory Visit: Payer: Self-pay | Admitting: *Deleted

## 2022-06-11 MED ORDER — SEMAGLUTIDE(0.25 OR 0.5MG/DOS) 2 MG/3ML ~~LOC~~ SOPN: 0.5000 mg | PEN_INJECTOR | SUBCUTANEOUS | 2 refills | Status: DC

## 2022-07-09 ENCOUNTER — Encounter: Payer: Self-pay | Admitting: Student

## 2022-07-09 ENCOUNTER — Ambulatory Visit (INDEPENDENT_AMBULATORY_CARE_PROVIDER_SITE_OTHER): Payer: Medicare HMO | Admitting: Student

## 2022-07-09 VITALS — BP 132/62 | HR 97 | Ht 60.0 in | Wt 198.0 lb

## 2022-07-09 DIAGNOSIS — I129 Hypertensive chronic kidney disease with stage 1 through stage 4 chronic kidney disease, or unspecified chronic kidney disease: Secondary | ICD-10-CM

## 2022-07-09 DIAGNOSIS — J449 Chronic obstructive pulmonary disease, unspecified: Secondary | ICD-10-CM | POA: Diagnosis not present

## 2022-07-09 DIAGNOSIS — N183 Chronic kidney disease, stage 3 unspecified: Secondary | ICD-10-CM

## 2022-07-09 DIAGNOSIS — E1122 Type 2 diabetes mellitus with diabetic chronic kidney disease: Secondary | ICD-10-CM

## 2022-07-09 DIAGNOSIS — Z23 Encounter for immunization: Secondary | ICD-10-CM | POA: Diagnosis not present

## 2022-07-09 DIAGNOSIS — F17200 Nicotine dependence, unspecified, uncomplicated: Secondary | ICD-10-CM | POA: Diagnosis not present

## 2022-07-09 LAB — POCT GLYCOSYLATED HEMOGLOBIN (HGB A1C): HbA1c, POC (controlled diabetic range): 7.9 % — AB (ref 0.0–7.0)

## 2022-07-09 MED ORDER — UMECLIDINIUM-VILANTEROL 62.5-25 MCG/ACT IN AEPB: 1.0000 | INHALATION_SPRAY | Freq: Every day | RESPIRATORY_TRACT | 0 refills | Status: DC

## 2022-07-09 NOTE — Assessment & Plan Note (Signed)
A1c 7.9% today, controlled.  Continue current regimen. Discussed eating small snacks throughout the day that have combination of protein, carb and fat to maintain euglycemia. Plan for her to return in 1 month for follow-up with diet and CBG log.

## 2022-07-09 NOTE — Progress Notes (Signed)
    SUBJECTIVE:   CHIEF COMPLAINT / HPI:   Odalys is a 66 year-old female here for diabetes follow-up and to discuss lung cancer screening.  T2DM: Current regimen includes metformin 1000 mg twice daily (patient reports only taking 500 mg twice daily), Jardiance 10 mg daily, Ozempic 0.5 mg weekly She says that she can tell when her sugars are too high or too low.  She says anything below 130 is too low for her, she feels sweaty, jittery and clammy.  She eats 1 meal a day.  She will have an apple or crackers for snack.   She wanted to know if the metformin is causing her blood sugars to be too low.  Did not bring her glucose log today.  Tobacco Use She has been smoking cigarettes since she was 66 years old.  Currently smoking 1 pack of cigarettes daily. Gets winded walking less than 1 block, coughs a lot.  Also has sputum production that is clear/frothy. Has to use a rollator to ambulate.  Lives in an independent living facility.  No formal diagnosis of COPD.  Had spirometry in April 2022 which showed moderate obstructive lung disease, was not initiated on LAMA at that time. Had COVID 4 weeks ago, feel like she is still recovering from this.  She wants to know if she needs imaging to rule out lung cancer given that she had a friend who was recently diagnosed with this.  Desires flu and pneumonia vaccines today.  PERTINENT  PMH / PSH: Reviewed  OBJECTIVE:   BP 132/62 (BP Location: Left Arm, Patient Position: Sitting)   Pulse 97   Ht 5' (1.524 m)   Wt 198 lb (89.8 kg)   SpO2 99%   BMI 38.67 kg/m   General: Alert and cooperative and appears to be in no acute distress, obese Cardio: Normal S1 and S2, no S3 or S4. Rhythm is regular. No murmurs or rubs.   Pulm: Normal work of breathing on room air.  No hypoxia.  Diminished lung sounds throughout secondary to body habitus and likely reduced inspiratory effort. Abdomen: Bowel sounds normal. Abdomen soft and non-tender.  Extremities: No  peripheral edema. Warm/ well perfused.  Strong radial pulses. Neuro: Cranial nerves grossly intact  ASSESSMENT/PLAN:   Type 2 DM with CKD stage 3 and hypertension (HCC) A1c 7.9% today, controlled.  Continue current regimen. Discussed eating small snacks throughout the day that have combination of protein, carb and fat to maintain euglycemia. Plan for her to return in 1 month for follow-up with diet and CBG log.  Obstructive lung disease ([place]) History most consistent with COPD given tobacco use history and cough, dyspnea on exertion, etc. Although, no concern for acute exacerbation today.  She is breathing well on room air, without hypoxia and lung sounds are clear. Initiated LAMA today (Anoro Ellipta 1 puff daily) into the lungs daily. Emphasized importance of daily use. Also ordered annual CT chest for lung nodule screening, per USPSTF guidelines. Discussed tobacco cessation, but patient says she needs to "do this on her own".  She has seen Dr. Valentina Lucks multiple times in clinic for this and has tried Chantix, nicotine patches, etc.  Plan to continue ongoing discussion with the patient.     Orvis Brill, [place]

## 2022-07-09 NOTE — Patient Instructions (Signed)
It was great seeing you today.  I will see you in 2 weeks to follow up on diet/blood sugars.  You will also see Dr. Valentina Lucks on the same day to discuss your lungs  I ordered a CT screening test, once your insurance authorizes this- you will be called to get it scheduled.   If you have any questions or concerns, please feel free to call the clinic.    Be well,  Dr. Orvis Brill [facility] [phone]

## 2022-07-09 NOTE — Assessment & Plan Note (Addendum)
History most consistent with COPD given tobacco use history and cough, dyspnea on exertion, etc. Although, no concern for acute exacerbation today.  She is breathing well on room air, without hypoxia and lung sounds are clear. Initiated LAMA today (Anoro Ellipta 1 puff daily) into the lungs daily. Emphasized importance of daily use. Also ordered annual CT chest for lung nodule screening, per USPSTF guidelines. Discussed tobacco cessation, but patient says she needs to "do this on her own".  She has seen Dr. Valentina Lucks multiple times in clinic for this and has tried Chantix, nicotine patches, etc.  Plan to continue ongoing discussion with the patient.

## 2022-07-10 ENCOUNTER — Telehealth: Payer: Self-pay

## 2022-07-10 MED ORDER — SPIRIVA RESPIMAT 2.5 MCG/ACT IN AERS: 2.0000 | INHALATION_SPRAY | Freq: Every day | RESPIRATORY_TRACT | 3 refills | Status: DC

## 2022-07-10 NOTE — Telephone Encounter (Signed)
Patient calls nurse line regarding insurance not covering Anoro inhaler.   Called pharmacy. This medication is not on patient's insurance formulary.   Insurance preferred medications are Stiolto or Spiriva.   Will forward to PCP. Please send in alternative if appropriate.   Talbot Grumbling, RN

## 2022-07-10 NOTE — Telephone Encounter (Signed)
Called patient and informed.   Talbot Grumbling, RN

## 2022-07-13 ENCOUNTER — Telehealth: Payer: Self-pay

## 2022-07-13 NOTE — Telephone Encounter (Signed)
Called pt. LVM asking for a return call to give her the following information on the schedule for her CT Scan.  Tuesday,Nov.7th. 10:00 am [facility] Entrance C.  No special instructions. Ottis Stain, CMA

## 2022-07-13 NOTE — Telephone Encounter (Signed)
Patient returns call to nurse line.    CT apt information given.

## 2022-07-17 DIAGNOSIS — I129 Hypertensive chronic kidney disease with stage 1 through stage 4 chronic kidney disease, or unspecified chronic kidney disease: Secondary | ICD-10-CM | POA: Diagnosis not present

## 2022-07-17 DIAGNOSIS — J449 Chronic obstructive pulmonary disease, unspecified: Secondary | ICD-10-CM | POA: Diagnosis not present

## 2022-07-17 DIAGNOSIS — Z23 Encounter for immunization: Secondary | ICD-10-CM | POA: Diagnosis not present

## 2022-07-17 DIAGNOSIS — F17200 Nicotine dependence, unspecified, uncomplicated: Secondary | ICD-10-CM | POA: Diagnosis not present

## 2022-07-17 DIAGNOSIS — E1122 Type 2 diabetes mellitus with diabetic chronic kidney disease: Secondary | ICD-10-CM | POA: Diagnosis not present

## 2022-07-17 DIAGNOSIS — N183 Chronic kidney disease, stage 3 unspecified: Secondary | ICD-10-CM | POA: Diagnosis not present

## 2022-07-18 ENCOUNTER — Other Ambulatory Visit: Payer: Self-pay | Admitting: Student

## 2022-07-18 DIAGNOSIS — Z8719 Personal history of other diseases of the digestive system: Secondary | ICD-10-CM

## 2022-07-18 DIAGNOSIS — I152 Hypertension secondary to endocrine disorders: Secondary | ICD-10-CM

## 2022-07-18 DIAGNOSIS — I10 Essential (primary) hypertension: Secondary | ICD-10-CM

## 2022-07-21 ENCOUNTER — Ambulatory Visit (HOSPITAL_COMMUNITY)
Admission: RE | Admit: 2022-07-21 | Discharge: 2022-07-21 | Disposition: A | Discharge status: Home / Self Care | Payer: Medicare HMO | Source: Ambulatory Visit | Attending: Family Medicine | Admitting: Family Medicine

## 2022-07-21 DIAGNOSIS — J449 Chronic obstructive pulmonary disease, unspecified: Secondary | ICD-10-CM | POA: Diagnosis not present

## 2022-07-21 DIAGNOSIS — Z122 Encounter for screening for malignant neoplasm of respiratory organs: Secondary | ICD-10-CM | POA: Diagnosis not present

## 2022-07-21 DIAGNOSIS — N183 Chronic kidney disease, stage 3 unspecified: Secondary | ICD-10-CM | POA: Diagnosis not present

## 2022-07-21 DIAGNOSIS — I129 Hypertensive chronic kidney disease with stage 1 through stage 4 chronic kidney disease, or unspecified chronic kidney disease: Secondary | ICD-10-CM | POA: Diagnosis not present

## 2022-07-21 DIAGNOSIS — Z23 Encounter for immunization: Secondary | ICD-10-CM | POA: Diagnosis not present

## 2022-07-21 DIAGNOSIS — F17200 Nicotine dependence, unspecified, uncomplicated: Secondary | ICD-10-CM | POA: Diagnosis not present

## 2022-07-21 DIAGNOSIS — F17210 Nicotine dependence, cigarettes, uncomplicated: Secondary | ICD-10-CM | POA: Insufficient documentation

## 2022-07-21 DIAGNOSIS — E1122 Type 2 diabetes mellitus with diabetic chronic kidney disease: Secondary | ICD-10-CM | POA: Diagnosis not present

## 2022-07-21 NOTE — Addendum Note (Signed)
Addended by: Londell Moh T on: 07/21/2022 01:11 PM   Modules accepted: Orders

## 2022-07-23 ENCOUNTER — Ambulatory Visit: Payer: Medicare HMO | Admitting: Pharmacist

## 2022-07-23 ENCOUNTER — Ambulatory Visit (INDEPENDENT_AMBULATORY_CARE_PROVIDER_SITE_OTHER): Payer: Medicare HMO | Admitting: Student

## 2022-07-23 ENCOUNTER — Encounter: Payer: Self-pay | Admitting: Student

## 2022-07-23 VITALS — BP 126/86 | HR 79 | Ht 60.0 in | Wt 202.4 lb

## 2022-07-23 DIAGNOSIS — E1122 Type 2 diabetes mellitus with diabetic chronic kidney disease: Secondary | ICD-10-CM

## 2022-07-23 DIAGNOSIS — J449 Chronic obstructive pulmonary disease, unspecified: Secondary | ICD-10-CM

## 2022-07-23 DIAGNOSIS — N183 Chronic kidney disease, stage 3 unspecified: Secondary | ICD-10-CM | POA: Diagnosis not present

## 2022-07-23 DIAGNOSIS — I129 Hypertensive chronic kidney disease with stage 1 through stage 4 chronic kidney disease, or unspecified chronic kidney disease: Secondary | ICD-10-CM

## 2022-07-23 DIAGNOSIS — Z23 Encounter for immunization: Secondary | ICD-10-CM

## 2022-07-23 MED ORDER — DICLOFENAC SODIUM 1 % EX GEL: 2.0000 g | Freq: Four times a day (QID) | CUTANEOUS | 0 refills | Status: DC

## 2022-07-23 MED ORDER — NYSTATIN 100000 UNIT/GM EX CREA: 1.0000 | TOPICAL_CREAM | Freq: Four times a day (QID) | CUTANEOUS | 1 refills | Status: DC

## 2022-07-23 NOTE — Progress Notes (Signed)
    SUBJECTIVE:   CHIEF COMPLAINT / HPI:   Alexandra Campos is a 66 year-old female here for follow-up of diabetes.  T2DM A1c 7.9% 1 month ago. Metformin 1000 mg BID (takes 500 mg BID) Jardiace 10 mg daily Ozempic 0.5 mg weekly Did not bring CBG log because "there has been so much going on." When she does check her glucose, she gets numbers in 140s-150s.   Obstructive lung disease Initiated LAMA at visit 07/09/22. Has been using it every other day.  Feels that her breathing is improved.  Smoking more cigarettes than usual because she is stressed.  Desires COVID vaccination today.  PERTINENT  PMH / PSH:   OBJECTIVE:   BP 126/86   Pulse 79   Ht 5' (1.524 m)   Wt 202 lb 6.4 oz (91.8 kg)   SpO2 99%   BMI 39.53 kg/m   General:Well-appearing, no distress CV: RRR Respiratory: Normal WOB on room air. Difficult auscultation 2/2 body habitus. Abdomen: Soft, NTND MSK: Tenderness to palpation over right 6th-10th intercostal spaces.   ASSESSMENT/PLAN:   Type 2 DM with CKD stage 3 and hypertension ([place]) Patient did not bring CBG log for review today. Has not had any hypoglycemic episodes. No changes to current regimen Follow-up in 2 months for A1c.   Obstructive lung disease (HCC) Continue Anoro Ellipta 1 puff into the lungs daily. Continue to discuss tobacco cessation. Awaiting final read for CT chest for lung cancer screening.     Orvis Brill, [place]

## 2022-07-23 NOTE — Assessment & Plan Note (Signed)
Patient did not bring CBG log for review today. Has not had any hypoglycemic episodes. No changes to current regimen Follow-up in 2 months for A1c.

## 2022-07-23 NOTE — Patient Instructions (Addendum)
Great seeing you today.  I sent in Voltaren gel for your muscular strain in your right side. I also sent in Nystatin cream for yeast infection.  We will see you back in 2 months to check your A1c and see how you're doing.  Keep using the inhaler every day.  Have a wonderful day, Dr. Owens Shark

## 2022-07-23 NOTE — Assessment & Plan Note (Signed)
Continue Anoro Ellipta 1 puff into the lungs daily. Continue to discuss tobacco cessation. Awaiting final read for CT chest for lung cancer screening.

## 2022-08-10 ENCOUNTER — Telehealth: Payer: Self-pay

## 2022-08-10 DIAGNOSIS — F17200 Nicotine dependence, unspecified, uncomplicated: Secondary | ICD-10-CM

## 2022-08-10 NOTE — Telephone Encounter (Signed)
Patient calls nurse line requesting CT scan results.   Will forward to PCP.

## 2022-08-11 NOTE — Telephone Encounter (Signed)
[facility] Radiology regarding CT lung cancer screening imaging completed on 11/7. Apparently, there was an IT issue and a repeat scan is needed. A representative named [place] should be reaching out to patient for scheduling this scan.  I called patient and relayed this information. She is to call the clinic by the end of the week if she has not heard from radiology for scheduling.  Orvis Brill, DO

## 2022-08-14 NOTE — Telephone Encounter (Signed)
Patient calls nurse line reporting she has not heard from Radiology to schedule.   Patient given phone number to reach out to them.   Patient to call back if having problems scheduling.

## 2022-08-25 ENCOUNTER — Telehealth: Payer: Self-pay

## 2022-08-25 DIAGNOSIS — I70219 Atherosclerosis of native arteries of extremities with intermittent claudication, unspecified extremity: Secondary | ICD-10-CM

## 2022-08-25 NOTE — Telephone Encounter (Signed)
Pt called c/o sharp pains running down her BLE, especially around the knee starting Sunday.  Reviewed pt's chart, returned call for clarification, two identifiers used. Pt was supposed to be seen 9 months after last visit in 11/22. Pt's L knee is swollen more than the R, but pt maintains that's always been the case. Appts for [place] and Dr. Scot Dock scheduled based on last office note. Confirmed understanding.

## 2022-08-28 NOTE — Addendum Note (Signed)
Addended by: Orvis Brill on: 08/28/2022 09:41 AM   Modules accepted: Orders

## 2022-08-28 NOTE — Telephone Encounter (Signed)
Patient calls nurse line reporting she spoke with Radiology this morning to reschedule her Lung CT.   She reports they are needing a new order from [place].   Will forward to PCP.

## 2022-08-28 NOTE — Addendum Note (Signed)
Addended by: Orvis Brill on: 08/28/2022 09:40 AM   Modules accepted: Orders

## 2022-09-10 ENCOUNTER — Ambulatory Visit (HOSPITAL_COMMUNITY): Payer: Medicare HMO

## 2022-09-10 ENCOUNTER — Ambulatory Visit: Payer: Medicare HMO | Admitting: Vascular Surgery

## 2022-09-17 ENCOUNTER — Ambulatory Visit (HOSPITAL_COMMUNITY)
Admission: RE | Admit: 2022-09-17 | Discharge: 2022-09-17 | Disposition: A | Discharge status: Home / Self Care | Payer: Medicare HMO | Source: Ambulatory Visit | Attending: Vascular Surgery | Admitting: Vascular Surgery

## 2022-09-17 ENCOUNTER — Ambulatory Visit (INDEPENDENT_AMBULATORY_CARE_PROVIDER_SITE_OTHER): Payer: Medicare HMO | Admitting: Vascular Surgery

## 2022-09-17 ENCOUNTER — Encounter: Payer: Self-pay | Admitting: Vascular Surgery

## 2022-09-17 VITALS — BP 168/91 | HR 76 | Temp 98.0°F | Resp 20 | Ht 60.0 in | Wt 202.0 lb

## 2022-09-17 DIAGNOSIS — I70219 Atherosclerosis of native arteries of extremities with intermittent claudication, unspecified extremity: Secondary | ICD-10-CM | POA: Diagnosis present

## 2022-09-17 DIAGNOSIS — I70229 Atherosclerosis of native arteries of extremities with rest pain, unspecified extremity: Secondary | ICD-10-CM

## 2022-09-17 NOTE — Progress Notes (Signed)
REASON FOR VISIT:   Follow-up of peripheral arterial disease.  MEDICAL ISSUES:   PERIPHERAL ARTERIAL DISEASE: This patient has multilevel arterial occlusive disease.  She has plaque in both common femoral arteries and superficial femoral artery occlusions bilaterally.  She has stable claudication however and no rest pain or nonhealing ulcers.  Her main complaint is her left knee.  I think she would benefit from orthopedic evaluation and hopefully should be a candidate for some injections in her knees to help with her pain.  Currently the left knee pain is limiting her activity.  We have again had a long discussion about the importance of tobacco cessation.  I would like to avoid femoral endarterectomy and femoropopliteal bypass grafting given the risk of infection with her obesity and diabetes.  I plan on seeing her back in 6 months and have ordered follow-up ABIs at that time.  She knows to call sooner if she has problems.   HPI:   Alexandra Campos is a pleasant 67 y.o. female who I last saw on 07/31/2021.  She has significant infrainguinal arterial occlusive disease bilaterally and at that time had stable claudication.  Based on her previous arteriogram her option for revascularization on both sides involved an extensive femoral endarterectomy and femoropopliteal bypass grafting.  I felt she would be at high risk for surgery given her obesity and diabetes.  We discussed the importance of exercise and nutrition.  She was to come back in 9 months but was then lost to follow-up.  We also had a long discussion about tobacco cessation at that time.  Past Medical History:  Diagnosis Date   Arthritis    Diabetes mellitus without complication ([place])    GERD (gastroesophageal reflux disease)    Hyperlipidemia    Hypertension     Family History  Problem Relation Age of Onset   Diabetes Mother    Hypertension Mother    Diabetes Father    Hypertension Father    Cancer Father    Diabetes Sister     Diabetes Sister    Diabetes Brother    Hypertension Brother    Colon cancer Brother 42   Diabetes Brother    Diabetes Daughter    Hypertension Daughter    Diabetes Son    Cancer Paternal Uncle    Diabetes Maternal Grandmother    Hypertension Maternal Grandmother    Colon polyps Neg Hx    Esophageal cancer Neg Hx    Rectal cancer Neg Hx    Stomach cancer Neg Hx     SOCIAL HISTORY: Social History   Tobacco Use   Smoking status: Every Day    Packs/day: 0.50    Types: Cigarettes    Passive exposure: Current   Smokeless tobacco: Never  Substance Use Topics   Alcohol use: Yes    Alcohol/week: 5.0 standard drinks of alcohol    Types: 5 Cans of beer per week    Comment: 5 drinks/week     No Known Allergies  Current Outpatient Medications  Medication Sig Dispense Refill   Acetaminophen (TYLENOL PO) Take by mouth.     albuterol (VENTOLIN HFA) 108 (90 Base) MCG/ACT inhaler Inhale 2 puffs into the lungs every 4 (four) hours as needed for wheezing or shortness of breath. 1 each 0   atorvastatin (LIPITOR) 40 MG tablet Take 1 tablet by mouth once daily 90 tablet 0   blood glucose meter kit and supplies KIT 1 each by Does not apply route daily.  Dispense based on patient and insurance preference. Use up to four times daily as directed. (FOR ICD-9 250.00, 250.01). 1 each 0   cetirizine (ZYRTEC) 10 MG tablet Take 1 tablet (10 mg total) by mouth daily. 30 tablet 0   chlorthalidone (HYGROTON) 25 MG tablet Take 1 tablet (25 mg total) by mouth daily. 90 tablet 3   diclofenac Sodium (VOLTAREN) 1 % GEL APPLY 4 GRAMS  TOPICALLY 4 TIMES DAILY FOR 14 DAYS 200 g 0   diclofenac Sodium (VOLTAREN) 1 % GEL Apply 2 g topically 4 (four) times daily. 100 g 0   empagliflozin (JARDIANCE) 10 MG TABS tablet Take 1 tablet (10 mg total) by mouth daily. 90 tablet 3   glucose blood (ACCU-CHEK GUIDE) test strip USE 1 STRIP TWICE DAILY FOR BLOOD SUGAR. E11.9 100 each 0   Lancets Thin MISC 1 each by Does not  apply route as needed. 100 each 0   losartan (COZAAR) 50 MG tablet TAKE 1 TABLET BY MOUTH ONCE DAILY . APPOINTMENT REQUIRED FOR FUTURE REFILLS 90 tablet 0   metFORMIN (GLUCOPHAGE-XR) 500 MG 24 hr tablet Take 2 tablets (1,000 mg total) by mouth in the morning and at bedtime. 360 tablet 3   Multiple Vitamin (MULTIVITAMIN PO) Take by mouth.     nystatin-triamcinolone ointment (MYCOLOG) Apply 1 Application topically 2 (two) times daily. 30 g 0   omeprazole (PRILOSEC) 20 MG capsule Take 1 capsule by mouth once daily 90 capsule 0   Semaglutide,0.25 or 0.5MG/DOS, 2 MG/3ML SOPN Inject 0.5 mg into the skin once a week. 3 mL 2   Tiotropium Bromide Monohydrate (SPIRIVA RESPIMAT) 2.5 MCG/ACT AERS Inhale 2 puffs into the lungs daily. 4 g 3   benzocaine (ORAJEL) 10 % mucosal gel Use as directed 1 application in the mouth or throat as needed for mouth pain. (Patient not taking: Reported on 09/18/2021) 5.3 g 0   No current facility-administered medications for this visit.    REVIEW OF SYSTEMS:  _0  denotes positive finding, _1  denotes negative finding Cardiac  Comments:  Chest pain or chest pressure:    Shortness of breath upon exertion: x   Short of breath when lying flat: x   Irregular heart rhythm:        Vascular    Pain in calf, thigh, or hip brought on by ambulation: x   Pain in feet at night that wakes you up from your sleep:     Blood clot in your veins:    Leg swelling:  x       Pulmonary    Oxygen at home:    Productive cough:     Wheezing:         Neurologic    Sudden weakness in arms or legs:     Sudden numbness in arms or legs:     Sudden onset of difficulty speaking or slurred speech:    Temporary loss of vision in one eye:     Problems with dizziness:         Gastrointestinal    Blood in stool:     Vomited blood:         Genitourinary    Burning when urinating:     Blood in urine:        Psychiatric    Major depression:         Hematologic    Bleeding problems:     Problems with blood clotting too easily:  Skin    Rashes or ulcers:        Constitutional    Fever or chills:     PHYSICAL EXAM:   Vitals:   09/17/22 1440  BP: (!) 168/91  Pulse: 76  Resp: 20  Temp: 98 F (36.7 C)  Weight: 202 lb (91.6 kg)  Height: 5' (1.524 m)   Body mass index is 39.45 kg/m.  GENERAL: The patient is a well-nourished female, in no acute distress. The vital signs are documented above. CARDIAC: There is a regular rate and rhythm.  VASCULAR: I do not detect carotid bruits. She has palpable radial pulses. She has palpable femoral pulses although she does have calcific plaque in both common femoral arteries which is palpable. I cannot palpate pedal pulses.  She has monophasic Doppler signals in both feet. She has no ulcerations in her feet. PULMONARY: There is good air exchange bilaterally without wheezing or rales. ABDOMEN: Soft and non-tender with normal pitched bowel sounds.  MUSCULOSKELETAL: There are no major deformities or cyanosis. NEUROLOGIC: No focal weakness or paresthesias are detected. SKIN: There are no ulcers or rashes noted. PSYCHIATRIC: The patient has a normal affect.  DATA:    ARTERIAL DOPPLER STUDY: I have independently interpreted her arterial Doppler study today.  On the right side she has a monophasic posterior tibial and dorsalis pedis signal.  ABI was 69%.  Toe pressure on the right was 45 mmHg.  On the left side she has a monophasic posterior tibial and dorsalis pedis signal.  The arteries were calcified and an ABI could not be obtained.  Toe pressure was 60 mmHg.  LABS: The most recent labs I have are from May 2023.  At that time her GFR was 59.  Creatinine 1.06.  [name] [facility] [phone]

## 2022-09-21 ENCOUNTER — Telehealth: Payer: Self-pay

## 2022-09-21 DIAGNOSIS — M1990 Unspecified osteoarthritis, unspecified site: Secondary | ICD-10-CM

## 2022-09-21 NOTE — Telephone Encounter (Signed)
Patient calls nurse line requesting referral for orthopedic specialist.   She was seen by [facility] who recommended referral due to knee pain.   Will forward request to PCP.   Talbot Grumbling, RN

## 2022-09-22 ENCOUNTER — Other Ambulatory Visit: Payer: Self-pay

## 2022-09-22 DIAGNOSIS — I70219 Atherosclerosis of native arteries of extremities with intermittent claudication, unspecified extremity: Secondary | ICD-10-CM

## 2022-09-22 DIAGNOSIS — I70229 Atherosclerosis of native arteries of extremities with rest pain, unspecified extremity: Secondary | ICD-10-CM

## 2022-09-24 ENCOUNTER — Ambulatory Visit (HOSPITAL_COMMUNITY)
Admission: RE | Admit: 2022-09-24 | Discharge: 2022-09-24 | Disposition: A | Discharge status: Home / Self Care | Payer: Medicare HMO | Source: Ambulatory Visit | Attending: Family Medicine | Admitting: Family Medicine

## 2022-09-24 DIAGNOSIS — J439 Emphysema, unspecified: Secondary | ICD-10-CM | POA: Diagnosis not present

## 2022-09-24 DIAGNOSIS — I2510 Atherosclerotic heart disease of native coronary artery without angina pectoris: Secondary | ICD-10-CM | POA: Diagnosis not present

## 2022-09-24 DIAGNOSIS — F17210 Nicotine dependence, cigarettes, uncomplicated: Secondary | ICD-10-CM | POA: Diagnosis not present

## 2022-09-24 DIAGNOSIS — Z122 Encounter for screening for malignant neoplasm of respiratory organs: Secondary | ICD-10-CM | POA: Insufficient documentation

## 2022-09-24 DIAGNOSIS — I700 Atherosclerosis of aorta: Secondary | ICD-10-CM | POA: Insufficient documentation

## 2022-09-24 DIAGNOSIS — Z87891 Personal history of nicotine dependence: Secondary | ICD-10-CM | POA: Diagnosis not present

## 2022-09-24 DIAGNOSIS — F17200 Nicotine dependence, unspecified, uncomplicated: Secondary | ICD-10-CM

## 2022-10-01 ENCOUNTER — Encounter: Payer: Self-pay | Admitting: Physician Assistant

## 2022-10-01 ENCOUNTER — Ambulatory Visit (INDEPENDENT_AMBULATORY_CARE_PROVIDER_SITE_OTHER): Payer: Medicare HMO

## 2022-10-01 ENCOUNTER — Ambulatory Visit (INDEPENDENT_AMBULATORY_CARE_PROVIDER_SITE_OTHER): Payer: Medicare HMO | Admitting: Physician Assistant

## 2022-10-01 DIAGNOSIS — M25562 Pain in left knee: Secondary | ICD-10-CM

## 2022-10-01 DIAGNOSIS — G8929 Other chronic pain: Secondary | ICD-10-CM | POA: Diagnosis not present

## 2022-10-01 DIAGNOSIS — R202 Paresthesia of skin: Secondary | ICD-10-CM

## 2022-10-01 DIAGNOSIS — R200 Anesthesia of skin: Secondary | ICD-10-CM

## 2022-10-01 NOTE — Progress Notes (Signed)
Office Visit Note   Patient: Alexandra Campos           Date of Birth: 1955-10-15           MRN: [id number] Visit Date: 10/01/2022              Requested by: Lenoria Chime, [place],  [place] [id number] PCP: Orvis Brill, DO   Assessment & Plan: Visit Diagnoses:  1. Numbness and tingling in right hand   2. Chronic pain of left knee     Plan: Patient is a pleasant 67 year old woman with a chronic history of left knee arthritis.  She denies any recent injuries.  She does ambulate with a cane.  She had discussed and considered left knee replacement in the past but has been told she has a blockage in her leg from her vascular surgeons and that until this was addressed she was at high risk for joint replacement.  So she has elected to treat this conservatively.  She has had a steroid injection in the past which was helpful.  She is a diabetic her most recent hemoglobin A1c was 7.9.  Did go forward and give her an injection today.  Could also consider viscosupplementation.  Upon leaving today she also mentions a long history of numbing and tingling in the first 3 digits of her right hand.  Denies any injury.  This especially wakes her up at night.  She was told it could be from her diabetes and given gabapentin which does not hurt.  Does have clinical findings that could suggest carpal tunnel syndrome so went forward and ordered a electrodiagnostic study.  Will follow-up with her after this is completed if she only gets partial relief from her cortisone injection will contact me and we will go forward with authorization for viscosupplementation  Follow-Up Instructions: Return if symptoms worsen or fail to improve.   Orders:  Orders Placed This Encounter  Procedures   XR Knee 1-2 Views Left   Ambulatory referral to Physical Medicine Rehab   No orders of the defined types were placed in this encounter.     Procedures: No procedures performed   Clinical Data: No  additional findings.   Subjective: Chief Complaint  Patient presents with   Left Knee - Pain    HPI pleasant 67 year old woman who comes in today requesting a cortisone injection into her left knee.  Also mentions numbness and tingling in her hand going on for 6 years or so.  Is progressing worse denies any neck pain and finds that the symptoms are mostly in the thumb index and middle finger  Review of Systems  All other systems reviewed and are negative.    Objective: Vital Signs: There were no vitals taken for this visit.  Physical Exam Constitutional:      Appearance: Normal appearance.  Pulmonary:     Effort: Pulmonary effort is normal.  Neurological:     General: No focal deficit present.     Mental Status: She is alert.     Ortho Exam Examination of her left knee no effusion no erythema no warmth.  She is tender more laterally than medially she has clinically valgus alignment.  She is neurovascularly intact distally compartments are soft and nontender Examination of her right wrist she has a palpable pulse no noted atrophy compared to her other hand she is right-hand dominant.  She has a negative Tinel's test but a positive Phalen's test.  Grip  strength is intact no evidence of any infective process Specialty Comments:  No specialty comments available.  Imaging: XR Knee 1-2 Views Left  Result Date: 10/01/2022 2 views of her left knee were obtained today.  She has degenerative changes of all 3 compartments but most specifically of the lateral compartment of her left knee with periarticular osteophytes and loss of joint spacing.  Also has calcification within the vessels.  No acute fractures    PMFS History: Patient Active Problem List   Diagnosis Date Noted   Obstructive lung disease ([place]) 07/09/2022   Rash 04/20/2022   Right upper quadrant pain 01/21/2022   Wheezing 12/13/2020   Low back pain 09/22/2020   Yeast infection involving the vagina and surrounding  area 06/10/2020   Complex cyst of right ovary 04/09/2020   Atherosclerosis of artery of extremity with rest pain ([place]) 03/15/2020   Ovarian cyst 03/09/2020   Postmenopausal bleeding 02/07/2020   Abnormal ankle brachial index (ABI) 01/14/2020   Abnormal facial hair 11/24/2019   Stress incontinence of urine 10/03/2019   Type 2 DM with CKD stage 3 and hypertension ([place]) 07/03/2019   Class 3 severe obesity with body mass index (BMI) of 40.0 to 44.9 in adult ([place]) 06/16/2019   CKD (chronic kidney disease) stage 3, GFR 30-59 ml/min ([place]) 06/16/2019   Healthcare maintenance 06/16/2019   Carpal tunnel syndrome 06/16/2019   Hypertension 08/04/2018   Tobacco dependence 08/04/2018   Past Medical History:  Diagnosis Date   Arthritis    Diabetes mellitus without complication ([place])    GERD (gastroesophageal reflux disease)    Hyperlipidemia    Hypertension     Family History  Problem Relation Age of Onset   Diabetes Mother    Hypertension Mother    Diabetes Father    Hypertension Father    Cancer Father    Diabetes Sister    Diabetes Sister    Diabetes Brother    Hypertension Brother    Colon cancer Brother 83   Diabetes Brother    Diabetes Daughter    Hypertension Daughter    Diabetes Son    Cancer Paternal Uncle    Diabetes Maternal Grandmother    Hypertension Maternal Grandmother    Colon polyps Neg Hx    Esophageal cancer Neg Hx    Rectal cancer Neg Hx    Stomach cancer Neg Hx     Past Surgical History:  Procedure Laterality Date   ABDOMINAL AORTOGRAM W/LOWER EXTREMITY N/A 03/15/2020   Procedure: ABDOMINAL AORTOGRAM W/LOWER EXTREMITY;  Surgeon: Angelia Mould, MD;  Location: [facility];  Service: Cardiovascular;  Laterality: N/A;   ANKLE SURGERY Right    INDUCED ABORTION     LYMPHADENECTOMY     Social History   Occupational History   Occupation: CNA    Comment: Retired  Tobacco Use   Smoking status: Every Day    Packs/day: 0.50    Types: Cigarettes     Passive exposure: Current   Smokeless tobacco: Never  Vaping Use   Vaping Use: Never used  Substance and Sexual Activity   Alcohol use: Yes    Alcohol/week: 5.0 standard drinks of alcohol    Types: 5 Cans of beer per week    Comment: 5 drinks/week    Drug use: Yes    Types: Marijuana   Sexual activity: Not on file

## 2022-10-06 ENCOUNTER — Telehealth: Payer: Self-pay | Admitting: Student

## 2022-10-06 ENCOUNTER — Other Ambulatory Visit: Payer: Self-pay

## 2022-10-06 DIAGNOSIS — N183 Chronic kidney disease, stage 3 unspecified: Secondary | ICD-10-CM

## 2022-10-06 MED ORDER — EMPAGLIFLOZIN 10 MG PO TABS: 10.0000 mg | ORAL_TABLET | Freq: Every day | ORAL | 3 refills | Status: DC

## 2022-10-06 NOTE — Telephone Encounter (Signed)
Patient would like for Dr. Owens Shark to call her to discuss test results from multiple test she has had done over the last couple weeks.   The best call back is [phone]

## 2022-10-07 ENCOUNTER — Ambulatory Visit (INDEPENDENT_AMBULATORY_CARE_PROVIDER_SITE_OTHER): Payer: Medicare HMO | Admitting: Physical Medicine and Rehabilitation

## 2022-10-07 DIAGNOSIS — I129 Hypertensive chronic kidney disease with stage 1 through stage 4 chronic kidney disease, or unspecified chronic kidney disease: Secondary | ICD-10-CM

## 2022-10-07 DIAGNOSIS — N183 Type 2 diabetes mellitus with diabetic chronic kidney disease: Secondary | ICD-10-CM

## 2022-10-07 DIAGNOSIS — R202 Paresthesia of skin: Secondary | ICD-10-CM

## 2022-10-07 DIAGNOSIS — M79641 Pain in right hand: Secondary | ICD-10-CM

## 2022-10-07 DIAGNOSIS — M542 Cervicalgia: Secondary | ICD-10-CM

## 2022-10-07 DIAGNOSIS — E1122 Type 2 diabetes mellitus with diabetic chronic kidney disease: Secondary | ICD-10-CM

## 2022-10-07 NOTE — Telephone Encounter (Signed)
Patient returns call to nurse line. Please advise.   Talbot Grumbling, RN

## 2022-10-07 NOTE — Progress Notes (Signed)
Functional Pain Scale - descriptive words and definitions  Distressing (6)    Pain is present/unable to complete most ADLs limited by pain/sleep is difficult and active distraction is only marginal. Moderate range order  Average Pain  varies  Right handed. Right hand and wrist pain. Numbness in fingers, index finger is the worse

## 2022-10-08 ENCOUNTER — Telehealth: Payer: Self-pay | Admitting: Student

## 2022-10-08 MED ORDER — SPIRIVA RESPIMAT 2.5 MCG/ACT IN AERS: 2.0000 | INHALATION_SPRAY | Freq: Every day | RESPIRATORY_TRACT | 3 refills | Status: DC

## 2022-10-08 NOTE — Procedures (Signed)
EMG & NCV Findings: Evaluation of the right median motor nerve showed prolonged distal onset latency (7.7 ms), reduced amplitude (4.0 mV), and decreased conduction velocity (Elbow-Wrist, 40 m/s).  The right median (across palm) sensory nerve showed prolonged distal peak latency (Wrist, 8.0 ms) and reduced amplitude (6.6 V).  All remaining nerves (as indicated in the following tables) were within normal limits.    All examined muscles (as indicated in the following table) showed no evidence of electrical instability.    Impression: The above electrodiagnostic study is ABNORMAL and reveals evidence of a severe right median nerve entrapment at the wrist (carpal tunnel syndrome) affecting sensory and motor components.   There is no significant electrodiagnostic evidence of any other focal nerve entrapment, brachial plexopathy or cervical radiculopathy.   Recommendations: 1.  Follow-up with referring physician. 2.  Continue current management of symptoms. 3.  Continue use of resting splint at night-time and as needed during the day. 4.  Suggest surgical evaluation.  ___________________________ Laurence Spates FAAPMR Board Certified, American Board of Physical Medicine and Rehabilitation    Nerve Conduction Studies Anti Sensory Summary Table   Stim Site NR Peak (ms) Norm Peak (ms) P-T Amp (V) Norm P-T Amp Site1 Site2 Delta-P (ms) Dist (cm) Vel (m/s) Norm Vel (m/s)  Right Median Acr Palm Anti Sensory (2nd Digit)  30.9C  Wrist    *8.0 <3.6 *6.6 >10 Wrist Palm 6.2 0.0    Palm    1.8 <2.0 6.3         Right Radial Anti Sensory (Base 1st Digit)  30.7C  Wrist    2.4 <3.1 51.9  Wrist Base 1st Digit 2.4 0.0    Right Ulnar Anti Sensory (5th Digit)  31.1C  Wrist    3.4 <3.7 30.1 >15.0 Wrist 5th Digit 3.4 14.0 41 >38   Motor Summary Table   Stim Site NR Onset (ms) Norm Onset (ms) O-P Amp (mV) Norm O-P Amp Site1 Site2 Delta-0 (ms) Dist (cm) Vel (m/s) Norm Vel (m/s)  Right Median Motor (Abd Poll  Brev)  30.6C  Wrist    *7.7 <4.2 *4.0 >5 Elbow Wrist 5.0 20.0 *40 >50  Elbow    12.7  3.6         Right Ulnar Motor (Abd Dig Min)  30.6C  Wrist    3.0 <4.2 14.1 >3 B Elbow Wrist 3.4 19.5 57 >53  B Elbow    6.4  13.6  A Elbow B Elbow 1.8 11.0 61 >53  A Elbow    8.2  13.0          EMG   Side Muscle Nerve Root Ins Act Fibs Psw Amp Dur Poly Recrt Int Fraser Din Comment  Right Abd Poll Brev Median C8-T1 Nml Nml Nml Nml Nml 0 Nml Nml   Right 1stDorInt Ulnar C8-T1 Nml Nml Nml Nml Nml 0 Nml Nml   Right PronatorTeres Median C6-7 Nml Nml Nml Nml Nml 0 Nml Nml   Right Biceps Musculocut C5-6 Nml Nml Nml Nml Nml 0 Nml Nml   Right Deltoid Axillary C5-6 Nml Nml Nml Nml Nml 0 Nml Nml     Nerve Conduction Studies Anti Sensory Left/Right Comparison   Stim Site L Lat (ms) R Lat (ms) L-R Lat (ms) L Amp (V) R Amp (V) L-R Amp (%) Site1 Site2 L Vel (m/s) R Vel (m/s) L-R Vel (m/s)  Median Acr Palm Anti Sensory (2nd Digit)  30.9C  Wrist  *8.0   *6.6  Wrist Palm  Palm  1.8   6.3        Radial Anti Sensory (Base 1st Digit)  30.7C  Wrist  2.4   51.9  Wrist Base 1st Digit     Ulnar Anti Sensory (5th Digit)  31.1C  Wrist  3.4   30.1  Wrist 5th Digit  41    Motor Left/Right Comparison   Stim Site L Lat (ms) R Lat (ms) L-R Lat (ms) L Amp (mV) R Amp (mV) L-R Amp (%) Site1 Site2 L Vel (m/s) R Vel (m/s) L-R Vel (m/s)  Median Motor (Abd Poll Brev)  30.6C  Wrist  *7.7   *4.0  Elbow Wrist  *40   Elbow  12.7   3.6        Ulnar Motor (Abd Dig Min)  30.6C  Wrist  3.0   14.1  B Elbow Wrist  57   B Elbow  6.4   13.6  A Elbow B Elbow  61   A Elbow  8.2   13.0           Waveforms:

## 2022-10-08 NOTE — Telephone Encounter (Signed)
Called pt with CT chest results showing moderate coronary artery calcifications and emphysema, but no lung nodules.  Already on atorvostatin 40 mg daily. Still smokes cigarettes- on Anoro Ellipta. Pt requesting refill.   No other questions, per patient.  Orvis Brill, DO

## 2022-10-09 ENCOUNTER — Telehealth: Payer: Self-pay

## 2022-10-09 ENCOUNTER — Other Ambulatory Visit: Payer: Self-pay

## 2022-10-09 NOTE — Telephone Encounter (Signed)
VOB submitted for Monovisc, left knee 

## 2022-10-09 NOTE — Telephone Encounter (Signed)
Left knee gel injection ?

## 2022-10-14 NOTE — Progress Notes (Signed)
Alexandra Campos - 67 y.o. female MRN [id number]  Date of birth: 09-29-1955  Office Visit Note: Visit Date: 10/07/2022 PCP: Orvis Brill, DO Referred by: Persons, Bevely Palmer, PA  Subjective: Chief Complaint  Patient presents with   Right Wrist - Pain   Right Hand - Numbness   HPI: Alexandra Campos is a 67 y.o. female who comes in today at the request of Bevely Palmer Persons, PA-C for evaluation and management of chronic, worsening and severe pain, numbness and tingling in the Right upper extremities.  Patient is Right hand dominant.  She reports pain numbness and tingling in the right hand particularly in the radial digits with also pain in the right arm shoulder and neck at times.  Does not get shooting pain down the arm.  Has a history of type 2 diabetes with kidney disease and complications.  Her last hemoglobin A1c was 8.9.  She also has some vascular issues in the legs.  She has end-stage arthritis of her knee that has been treated by Bevely Palmer Persons, PA-C.  She denies any left-sided arm or hand symptoms.  She has not had cervical MRI.  No prior electrodiagnostic studies.  She does have some paresthesias in the feet.  She has been told that the symptoms have been related to a neuropathy.      Review of Systems  Musculoskeletal:  Positive for back pain, joint pain and neck pain.  Neurological:  Positive for tingling and weakness.  All other systems reviewed and are negative.  Otherwise per HPI.  Assessment & Plan: Visit Diagnoses:    ICD-10-CM   1. Paresthesia of skin  R20.2 NCV with EMG (electromyography)    2. Pain in right hand  M79.641     3. Type 2 DM with CKD stage 3 and hypertension (HCC)  E11.22    I12.9    N18.30     4. Cervicalgia  M54.2        Plan: Impression: Clinically appears to be related to carpal tunnel syndrome and median nerve neuropathy at the wrist but patient with high risk of peripheral polyneuropathy given hemoglobin A1c.  She has some features  consistent with radiculopathy but not frank radiculopathy.  She likely also has some level of arthritic changes and pain in the hand.  Electrodiagnostic study performed.  The above electrodiagnostic study is ABNORMAL and reveals evidence of a severe right median nerve entrapment at the wrist (carpal tunnel syndrome) affecting sensory and motor components.   There is no significant electrodiagnostic evidence of any other focal nerve entrapment, brachial plexopathy or cervical radiculopathy.   Recommendations: 1.  Follow-up with referring physician.  Per referring physicians request patient will follow-up with Dr. Eduard Roux for the carpal tunnel syndrome. 2.  Continue current management of symptoms. 3.  Continue use of resting splint at night-time and as needed during the day. 4.  Suggest surgical evaluation.  Meds & Orders: No orders of the defined types were placed in this encounter.   Orders Placed This Encounter  Procedures   NCV with EMG (electromyography)    Follow-up: Return for Bevely Palmer Persons, PA-C and Eduard Roux, MD.   Procedures: No procedures performed  EMG & NCV Findings: Evaluation of the right median motor nerve showed prolonged distal onset latency (7.7 ms), reduced amplitude (4.0 mV), and decreased conduction velocity (Elbow-Wrist, 40 m/s).  The right median (across palm) sensory nerve showed prolonged distal peak latency (Wrist, 8.0 ms) and reduced amplitude (6.6 V).  All remaining nerves (as indicated in the following tables) were within normal limits.    All examined muscles (as indicated in the following table) showed no evidence of electrical instability.    Impression: The above electrodiagnostic study is ABNORMAL and reveals evidence of a severe right median nerve entrapment at the wrist (carpal tunnel syndrome) affecting sensory and motor components.   There is no significant electrodiagnostic evidence of any other focal nerve entrapment, brachial plexopathy or  cervical radiculopathy.   Recommendations: 1.  Follow-up with referring physician. 2.  Continue current management of symptoms. 3.  Continue use of resting splint at night-time and as needed during the day. 4.  Suggest surgical evaluation.  ___________________________ Laurence Spates FAAPMR Board Certified, American Board of Physical Medicine and Rehabilitation    Nerve Conduction Studies Anti Sensory Summary Table   Stim Site NR Peak (ms) Norm Peak (ms) P-T Amp (V) Norm P-T Amp Site1 Site2 Delta-P (ms) Dist (cm) Vel (m/s) Norm Vel (m/s)  Right Median Acr Palm Anti Sensory (2nd Digit)  30.9C  Wrist    *8.0 <3.6 *6.6 >10 Wrist Palm 6.2 0.0    Palm    1.8 <2.0 6.3         Right Radial Anti Sensory (Base 1st Digit)  30.7C  Wrist    2.4 <3.1 51.9  Wrist Base 1st Digit 2.4 0.0    Right Ulnar Anti Sensory (5th Digit)  31.1C  Wrist    3.4 <3.7 30.1 >15.0 Wrist 5th Digit 3.4 14.0 41 >38   Motor Summary Table   Stim Site NR Onset (ms) Norm Onset (ms) O-P Amp (mV) Norm O-P Amp Site1 Site2 Delta-0 (ms) Dist (cm) Vel (m/s) Norm Vel (m/s)  Right Median Motor (Abd Poll Brev)  30.6C  Wrist    *7.7 <4.2 *4.0 >5 Elbow Wrist 5.0 20.0 *40 >50  Elbow    12.7  3.6         Right Ulnar Motor (Abd Dig Min)  30.6C  Wrist    3.0 <4.2 14.1 >3 B Elbow Wrist 3.4 19.5 57 >53  B Elbow    6.4  13.6  A Elbow B Elbow 1.8 11.0 61 >53  A Elbow    8.2  13.0          EMG   Side Muscle Nerve Root Ins Act Fibs Psw Amp Dur Poly Recrt Int Fraser Din Comment  Right Abd Poll Brev Median C8-T1 Nml Nml Nml Nml Nml 0 Nml Nml   Right 1stDorInt Ulnar C8-T1 Nml Nml Nml Nml Nml 0 Nml Nml   Right PronatorTeres Median C6-7 Nml Nml Nml Nml Nml 0 Nml Nml   Right Biceps Musculocut C5-6 Nml Nml Nml Nml Nml 0 Nml Nml   Right Deltoid Axillary C5-6 Nml Nml Nml Nml Nml 0 Nml Nml     Nerve Conduction Studies Anti Sensory Left/Right Comparison   Stim Site L Lat (ms) R Lat (ms) L-R Lat (ms) L Amp (V) R Amp (V) L-R Amp (%) Site1  Site2 L Vel (m/s) R Vel (m/s) L-R Vel (m/s)  Median Acr Palm Anti Sensory (2nd Digit)  30.9C  Wrist  *8.0   *6.6  Wrist Palm     Palm  1.8   6.3        Radial Anti Sensory (Base 1st Digit)  30.7C  Wrist  2.4   51.9  Wrist Base 1st Digit     Ulnar Anti Sensory (5th Digit)  31.1C  Wrist  3.4  30.1  Wrist 5th Digit  41    Motor Left/Right Comparison   Stim Site L Lat (ms) R Lat (ms) L-R Lat (ms) L Amp (mV) R Amp (mV) L-R Amp (%) Site1 Site2 L Vel (m/s) R Vel (m/s) L-R Vel (m/s)  Median Motor (Abd Poll Brev)  30.6C  Wrist  *7.7   *4.0  Elbow Wrist  *40   Elbow  12.7   3.6        Ulnar Motor (Abd Dig Min)  30.6C  Wrist  3.0   14.1  B Elbow Wrist  57   B Elbow  6.4   13.6  A Elbow B Elbow  61   A Elbow  8.2   13.0           Waveforms:             Clinical History: No specialty comments available.   She reports that she has been smoking cigarettes. She has been smoking an average of .5 packs per day. She has been exposed to tobacco smoke. She has never used smokeless tobacco.  Recent Labs    11/26/21 1012 07/09/22 1335  HGBA1C 8.9* 7.9*    Objective:  VS:  HT:    WT:   BMI:     BP:   HR: bpm  TEMP: ( )  RESP:  Physical Exam Vitals and nursing note reviewed.  Constitutional:      General: She is not in acute distress.    Appearance: Normal appearance. She is well-developed. She is obese. She is not ill-appearing.  HENT:     Head: Normocephalic and atraumatic.  Eyes:     Conjunctiva/sclera: Conjunctivae normal.     Pupils: Pupils are equal, round, and reactive to light.  Cardiovascular:     Rate and Rhythm: Normal rate.     Pulses: Normal pulses.  Pulmonary:     Effort: Pulmonary effort is normal.  Musculoskeletal:        General: No swelling or deformity.     Cervical back: Normal range of motion. Tenderness present.     Right lower leg: No edema.     Left lower leg: No edema.     Comments: Inspection reveals flattening of the right APB compared to  left but no atrophy of the bilateral APB or FDI or hand intrinsics. There is no swelling, color changes, allodynia or dystrophic changes. There is 5 out of 5 strength in the bilateral wrist extension, finger abduction and long finger flexion.  He has impaired sensation to light touch in the right median nerve distribution compared to the left.. There is a negative Froment's test bilaterally. There is a negative Tinel's test at the bilateral wrist and elbow. There is a positive Phalen's test on the right. There is a negative Hoffmann's test bilaterally.  Skin:    General: Skin is warm and dry.     Findings: No erythema or rash.  Neurological:     General: No focal deficit present.     Mental Status: She is alert and oriented to person, place, and time.     Sensory: No sensory deficit.     Motor: No weakness or abnormal muscle tone.     Coordination: Coordination normal.     Gait: Gait normal.  Psychiatric:        Mood and Affect: Mood normal.        Behavior: Behavior normal.     Ortho Exam  Imaging: No results found.  Past Medical/Family/Surgical/Social History: Medications & Allergies reviewed per EMR, new medications updated. Patient Active Problem List   Diagnosis Date Noted   Obstructive lung disease ([place]) 07/09/2022   Rash 04/20/2022   Right upper quadrant pain 01/21/2022   Wheezing 12/13/2020   Low back pain 09/22/2020   Yeast infection involving the vagina and surrounding area 06/10/2020   Complex cyst of right ovary 04/09/2020   Atherosclerosis of artery of extremity with rest pain ([place]) 03/15/2020   Ovarian cyst 03/09/2020   Postmenopausal bleeding 02/07/2020   Abnormal ankle brachial index (ABI) 01/14/2020   Abnormal facial hair 11/24/2019   Stress incontinence of urine 10/03/2019   Type 2 DM with CKD stage 3 and hypertension ([place]) 07/03/2019   Class 3 severe obesity with body mass index (BMI) of 40.0 to 44.9 in adult ([place]) 06/16/2019   CKD (chronic kidney disease)  stage 3, GFR 30-59 ml/min ([place]) 06/16/2019   Healthcare maintenance 06/16/2019   Carpal tunnel syndrome 06/16/2019   Hypertension 08/04/2018   Tobacco dependence 08/04/2018   Past Medical History:  Diagnosis Date   Arthritis    Diabetes mellitus without complication ([place])    GERD (gastroesophageal reflux disease)    Hyperlipidemia    Hypertension    Family History  Problem Relation Age of Onset   Diabetes Mother    Hypertension Mother    Diabetes Father    Hypertension Father    Cancer Father    Diabetes Sister    Diabetes Sister    Diabetes Brother    Hypertension Brother    Colon cancer Brother 66   Diabetes Brother    Diabetes Daughter    Hypertension Daughter    Diabetes Son    Cancer Paternal Uncle    Diabetes Maternal Grandmother    Hypertension Maternal Grandmother    Colon polyps Neg Hx    Esophageal cancer Neg Hx    Rectal cancer Neg Hx    Stomach cancer Neg Hx    Past Surgical History:  Procedure Laterality Date   ABDOMINAL AORTOGRAM W/LOWER EXTREMITY N/A 03/15/2020   Procedure: ABDOMINAL AORTOGRAM W/LOWER EXTREMITY;  Surgeon: Angelia Mould, MD;  Location: [facility];  Service: Cardiovascular;  Laterality: N/A;   ANKLE SURGERY Right    INDUCED ABORTION     LYMPHADENECTOMY     Social History   Occupational History   Occupation: CNA    Comment: Retired  Tobacco Use   Smoking status: Every Day    Packs/day: 0.50    Types: Cigarettes    Passive exposure: Current   Smokeless tobacco: Never  Vaping Use   Vaping Use: Never used  Substance and Sexual Activity   Alcohol use: Yes    Alcohol/week: 5.0 standard drinks of alcohol    Types: 5 Cans of beer per week    Comment: 5 drinks/week    Drug use: Yes    Types: Marijuana   Sexual activity: Not on file

## 2022-10-15 ENCOUNTER — Other Ambulatory Visit: Payer: Self-pay | Admitting: Student

## 2022-10-15 DIAGNOSIS — I10 Essential (primary) hypertension: Secondary | ICD-10-CM

## 2022-10-15 DIAGNOSIS — E1159 Type 2 diabetes mellitus with other circulatory complications: Secondary | ICD-10-CM

## 2022-10-15 DIAGNOSIS — Z8719 Personal history of other diseases of the digestive system: Secondary | ICD-10-CM

## 2022-10-16 ENCOUNTER — Other Ambulatory Visit: Payer: Self-pay

## 2022-10-16 ENCOUNTER — Telehealth: Payer: Self-pay

## 2022-10-16 DIAGNOSIS — G8929 Other chronic pain: Secondary | ICD-10-CM

## 2022-10-16 NOTE — Telephone Encounter (Signed)
Talked with patient and advised her that gel injection was approved for left knee, but patient stated that she would like to have injection done when she has her carpel tunnel surgery.  Check referral tab

## 2022-10-20 ENCOUNTER — Ambulatory Visit: Payer: Medicare HMO | Admitting: Physician Assistant

## 2022-10-20 ENCOUNTER — Telehealth: Payer: Self-pay

## 2022-10-20 DIAGNOSIS — R062 Wheezing: Secondary | ICD-10-CM

## 2022-10-20 NOTE — Telephone Encounter (Signed)
Patient calls nurse line requesting a referral to Pulmonology.   She reports she has already discussed this with PCP, however at the time she was not ready to move forward with referral.   She reports she is ready now and would like this placed by PCP.   Will forward.

## 2022-10-21 NOTE — Telephone Encounter (Signed)
Pt informed. Soliana Kitko T Yelitza Reach, CMA  

## 2022-10-25 ENCOUNTER — Other Ambulatory Visit: Payer: Self-pay | Admitting: Student

## 2022-10-27 ENCOUNTER — Ambulatory Visit (INDEPENDENT_AMBULATORY_CARE_PROVIDER_SITE_OTHER): Payer: Medicare HMO | Admitting: Orthopaedic Surgery

## 2022-10-27 ENCOUNTER — Encounter: Payer: Self-pay | Admitting: Orthopaedic Surgery

## 2022-10-27 DIAGNOSIS — M1712 Unilateral primary osteoarthritis, left knee: Secondary | ICD-10-CM

## 2022-10-27 DIAGNOSIS — G5601 Carpal tunnel syndrome, right upper limb: Secondary | ICD-10-CM | POA: Diagnosis not present

## 2022-10-27 MED ORDER — HYALURONAN 88 MG/4ML IX SOSY
88.0000 mg | PREFILLED_SYRINGE | INTRA_ARTICULAR | Status: AC | PRN
  Administered 2022-10-27: 88 mg via INTRA_ARTICULAR

## 2022-10-27 NOTE — Progress Notes (Signed)
Office Visit Note   Patient: Alexandra Campos           Date of Birth: July 04, 1956           MRN: [id number] Visit Date: 10/27/2022              Requested by: Orvis Brill, DO [id number] [place],  [place] [address] PCP: Orvis Brill, DO   Assessment & Plan: Visit Diagnoses:  1. Primary osteoarthritis of left knee   2. Right carpal tunnel syndrome     Plan: Impression is severe right carpal tunnel syndrome and left knee osteoarthritis.  Based on the EMGs I have recommended surgical release.  We discussed the details of surgery including risk benefits prognosis.  All questions answered.  Jackelyn Poling will call the patient to confirm surgery time.  Left knee injected with Monovisc which she tolerated well today.  Follow-Up Instructions: No follow-ups on file.   Orders:  No orders of the defined types were placed in this encounter.  No orders of the defined types were placed in this encounter.     Procedures: Large Joint Inj: L knee on 10/27/2022 10:47 AM Indications: pain Details: 22 G needle  Arthrogram: No  Medications: 88 mg Hyaluronan 88 MG/4ML Outcome: tolerated well, no immediate complications Patient was prepped and draped in the usual sterile fashion.       Clinical Data: No additional findings.   Subjective: Chief Complaint  Patient presents with   Right Hand - Follow-up    EMG review per Bevely Palmer   Left Hand - Follow-up    Monovisc    HPI Ms. Alexandra Campos returns today for discussion of carpal tunnel syndrome and for planned left knee Monovisc injection.  She had nerve conduction studies which showed severe carpal tunnel syndrome in the right hand. Review of Systems   Objective: Vital Signs: There were no vitals taken for this visit.  Physical Exam  Ortho Exam Exam is unchanged. Specialty Comments:  No specialty comments available.  Imaging: No results found.   PMFS History: Patient Active Problem List   Diagnosis Date Noted    Obstructive lung disease ([place]) 07/09/2022   Rash 04/20/2022   Right upper quadrant pain 01/21/2022   Wheezing 12/13/2020   Low back pain 09/22/2020   Yeast infection involving the vagina and surrounding area 06/10/2020   Complex cyst of right ovary 04/09/2020   Atherosclerosis of artery of extremity with rest pain ([place]) 03/15/2020   Ovarian cyst 03/09/2020   Postmenopausal bleeding 02/07/2020   Abnormal ankle brachial index (ABI) 01/14/2020   Abnormal facial hair 11/24/2019   Stress incontinence of urine 10/03/2019   Type 2 DM with CKD stage 3 and hypertension ([place]) 07/03/2019   Class 3 severe obesity with body mass index (BMI) of 40.0 to 44.9 in adult ([place]) 06/16/2019   CKD (chronic kidney disease) stage 3, GFR 30-59 ml/min ([place]) 06/16/2019   Healthcare maintenance 06/16/2019   Carpal tunnel syndrome 06/16/2019   Hypertension 08/04/2018   Tobacco dependence 08/04/2018   Past Medical History:  Diagnosis Date   Arthritis    Diabetes mellitus without complication ([place])    GERD (gastroesophageal reflux disease)    Hyperlipidemia    Hypertension     Family History  Problem Relation Age of Onset   Diabetes Mother    Hypertension Mother    Diabetes Father    Hypertension Father    Cancer Father    Diabetes Sister    Diabetes Sister  Diabetes Brother    Hypertension Brother    Colon cancer Brother 29   Diabetes Brother    Diabetes Daughter    Hypertension Daughter    Diabetes Son    Cancer Paternal Uncle    Diabetes Maternal Grandmother    Hypertension Maternal Grandmother    Colon polyps Neg Hx    Esophageal cancer Neg Hx    Rectal cancer Neg Hx    Stomach cancer Neg Hx     Past Surgical History:  Procedure Laterality Date   ABDOMINAL AORTOGRAM W/LOWER EXTREMITY N/A 03/15/2020   Procedure: ABDOMINAL AORTOGRAM W/LOWER EXTREMITY;  Surgeon: Angelia Mould, MD;  Location: [facility];  Service: Cardiovascular;  Laterality: N/A;   ANKLE SURGERY Right     INDUCED ABORTION     LYMPHADENECTOMY     Social History   Occupational History   Occupation: CNA    Comment: Retired  Tobacco Use   Smoking status: Every Day    Packs/day: 0.50    Types: Cigarettes    Passive exposure: Current   Smokeless tobacco: Never  Vaping Use   Vaping Use: Never used  Substance and Sexual Activity   Alcohol use: Yes    Alcohol/week: 5.0 standard drinks of alcohol    Types: 5 Cans of beer per week    Comment: 5 drinks/week    Drug use: Yes    Types: Marijuana   Sexual activity: Not on file

## 2022-11-09 ENCOUNTER — Encounter: Payer: Self-pay | Admitting: Pulmonary Disease

## 2022-11-09 ENCOUNTER — Ambulatory Visit (INDEPENDENT_AMBULATORY_CARE_PROVIDER_SITE_OTHER): Payer: Medicare HMO | Admitting: Pulmonary Disease

## 2022-11-09 VITALS — BP 126/82 | HR 89 | Ht 60.0 in | Wt 200.0 lb

## 2022-11-09 DIAGNOSIS — J432 Centrilobular emphysema: Secondary | ICD-10-CM

## 2022-11-09 DIAGNOSIS — F17210 Nicotine dependence, cigarettes, uncomplicated: Secondary | ICD-10-CM | POA: Diagnosis not present

## 2022-11-09 MED ORDER — FLUTICASONE-SALMETEROL 250-50 MCG/ACT IN AEPB: 1.0000 | INHALATION_SPRAY | Freq: Two times a day (BID) | RESPIRATORY_TRACT | 5 refills | Status: DC

## 2022-11-09 MED ORDER — ALBUTEROL SULFATE HFA 108 (90 BASE) MCG/ACT IN AERS: 2.0000 | INHALATION_SPRAY | RESPIRATORY_TRACT | 0 refills | Status: DC | PRN

## 2022-11-09 MED ORDER — BUPROPION HCL ER (SR) 150 MG PO TB12: 150.0000 mg | ORAL_TABLET | Freq: Two times a day (BID) | ORAL | 3 refills | Status: DC

## 2022-11-09 MED ORDER — RSVPREF3 VAC RECOMB ADJUVANTED 120 MCG/0.5ML IM SUSR: 0.5000 mL | Freq: Once | INTRAMUSCULAR | 0 refills | Status: AC

## 2022-11-09 NOTE — Patient Instructions (Addendum)
Start wixella inhaler 1 puff twice daily - rinse mouth out after each use  Continue to use spiriva 2 puffs daily  Use albuterol inhaler as needed, 1-2 puffs every 4-6 hours  Recommend quitting smoking use wellbutrin and nicotine patches.  Start wellbutrin 1 tab daily for 7 days, then take 1 tab twice daily there after  Recommend starting with '14mg'$  nicotine patch daily  Follow up in 3 months for pulmonary function testing.

## 2022-11-09 NOTE — Progress Notes (Unsigned)
Synopsis: Referred in February 2024 for wheezing by Yehuda Savannah, MD  Subjective:   PATIENT ID: Alexandra Campos GENDER: female DOB: Sep 25, 1955, MRN: [id number]   HPI  Chief Complaint  Patient presents with   Consult    Referred by PCP for possible emphysema. Also believes her appt has mold issues. Increased wheezing and coughing for the past 2 months but has been in her apt for 3 years.    Alexandra Campos is a 67 year old woman, daily smoker with history of DMII, GERD, HLD and hypertension who is referred to pulmonary clinic for emphysema.   She complains of waking up with shortness of breath, dry nose, dry mouth and cough with clear mucous production.. She has wheezing every day. She is currently using spiriva which helps somewhat. She has not been using albuterol as she ran out of refills. She has history of asthma in childhood. She has peripheral vascular disease of the left hip and is encouraged to walk daily but she has difficulty being active due to her breathing.   She is smoking 1 pack per day and has been smoking over 40 years. No history of second hand smoke in childhood. She has tried to quit in the past without success.   Past Medical History:  Diagnosis Date   Arthritis    Diabetes mellitus without complication ([place])    GERD (gastroesophageal reflux disease)    Hyperlipidemia    Hypertension      Family History  Problem Relation Age of Onset   Diabetes Mother    Hypertension Mother    Diabetes Father    Hypertension Father    Cancer Father    Diabetes Sister    Diabetes Sister    Diabetes Brother    Hypertension Brother    Colon cancer Brother 87   Diabetes Brother    Diabetes Daughter    Hypertension Daughter    Diabetes Son    Cancer Paternal Uncle    Diabetes Maternal Grandmother    Hypertension Maternal Grandmother    Colon polyps Neg Hx    Esophageal cancer Neg Hx    Rectal cancer Neg Hx    Stomach cancer Neg Hx      Social History    Socioeconomic History   Marital status: Single    Spouse name: Not on file   Number of children: 2   Years of education: 12   Highest education level: 12th grade  Occupational History   Occupation: CNA    Comment: Retired  Tobacco Use   Smoking status: Every Day    Packs/day: 0.50    Types: Cigarettes    Passive exposure: Current   Smokeless tobacco: Never  Vaping Use   Vaping Use: Never used  Substance and Sexual Activity   Alcohol use: Yes    Alcohol/week: 5.0 standard drinks of alcohol    Types: 5 Cans of beer per week    Comment: 5 drinks/week    Drug use: Yes    Types: Marijuana   Sexual activity: Not on file  Other Topics Concern   Not on file  Social History Narrative   Patient lives alone in an independent living facility.    Patient walks the parameter daily ~10 minutes.    Patient enjoys seeing her neighbors.    Patient has one son and one daughter who passed away.    Social Determinants of Health   Financial Resource Strain: Low Risk  (11/04/2021)   Overall Financial  Resource Strain (CARDIA)    Difficulty of Paying Living Expenses: Not very hard  Food Insecurity: No Food Insecurity (11/04/2021)   Hunger Vital Sign    Worried About Running Out of Food in the Last Year: Never true    Ran Out of Food in the Last Year: Never true  Transportation Needs: No Transportation Needs (11/04/2021)   PRAPARE - Hydrologist (Medical): No    Lack of Transportation (Non-Medical): No  Physical Activity: Insufficiently Active (11/04/2021)   Exercise Vital Sign    Days of Exercise per Week: 7 days    Minutes of Exercise per Session: 10 min  Stress: Stress Concern Present (11/04/2021)   [place]    Feeling of Stress : To some extent  Social Connections: Moderately Isolated (11/04/2021)   Social Connection and Isolation Panel [NHANES]    Frequency of Communication with Friends and  Family: More than three times a week    Frequency of Social Gatherings with Friends and Family: More than three times a week    Attends Religious Services: More than 4 times per year    Active Member of [employer] or Organizations: No    Attends Archivist Meetings: Never    Marital Status: Never married  Intimate Partner Violence: Not At Risk (11/04/2021)   Humiliation, Afraid, Rape, and Kick questionnaire    Fear of Current or Ex-Partner: No    Emotionally Abused: No    Physically Abused: No    Sexually Abused: No     No Known Allergies   Outpatient Medications Prior to Visit  Medication Sig Dispense Refill   Acetaminophen (TYLENOL PO) Take by mouth.     atorvastatin (LIPITOR) 40 MG tablet Take 1 tablet by mouth once daily 90 tablet 0   benzocaine (ORAJEL) 10 % mucosal gel Use as directed 1 application in the mouth or throat as needed for mouth pain. 5.3 g 0   blood glucose meter kit and supplies KIT 1 each by Does not apply route daily. Dispense based on patient and insurance preference. Use up to four times daily as directed. (FOR ICD-9 250.00, 250.01). 1 each 0   cetirizine (ZYRTEC) 10 MG tablet Take 1 tablet (10 mg total) by mouth daily. 30 tablet 0   chlorthalidone (HYGROTON) 25 MG tablet Take 1 tablet (25 mg total) by mouth daily. 90 tablet 3   diclofenac Sodium (VOLTAREN) 1 % GEL APPLY 4 GRAMS  TOPICALLY 4 TIMES DAILY FOR 14 DAYS 200 g 0   diclofenac Sodium (VOLTAREN) 1 % GEL Apply 2 g topically 4 (four) times daily. 100 g 0   empagliflozin (JARDIANCE) 10 MG TABS tablet Take 1 tablet (10 mg total) by mouth daily. 90 tablet 3   glucose blood (ACCU-CHEK GUIDE) test strip USE 1 STRIP TWICE DAILY FOR BLOOD SUGAR. E11.9 100 each 0   Lancets Thin MISC 1 each by Does not apply route as needed. 100 each 0   losartan (COZAAR) 50 MG tablet TAKE 1 TABLET BY MOUTH ONCE DAILY . APPOINTMENT REQUIRED FOR FUTURE REFILLS 90 tablet 0   metFORMIN (GLUCOPHAGE-XR) 500 MG 24 hr tablet Take 2  tablets (1,000 mg total) by mouth in the morning and at bedtime. 360 tablet 3   Multiple Vitamin (MULTIVITAMIN PO) Take by mouth.     nystatin-triamcinolone ointment (MYCOLOG) Apply 1 Application topically 2 (two) times daily. 30 g 0   omeprazole (PRILOSEC) 20 MG capsule  Take 1 capsule by mouth once daily 90 capsule 0   OZEMPIC, 0.25 OR 0.5 MG/DOSE, 2 MG/3ML SOPN INJECT 0.5 MG INTO THE SKIN ONCE A WEEK 3 mL 0   Tiotropium Bromide Monohydrate (SPIRIVA RESPIMAT) 2.5 MCG/ACT AERS Inhale 2 puffs into the lungs daily. 4 g 3   albuterol (VENTOLIN HFA) 108 (90 Base) MCG/ACT inhaler Inhale 2 puffs into the lungs every 4 (four) hours as needed for wheezing or shortness of breath. 1 each 0   No facility-administered medications prior to visit.    Review of Systems  Constitutional:  Negative for chills, fever, malaise/fatigue and weight loss.  HENT:  Positive for congestion. Negative for sinus pain and sore throat.   Eyes: Negative.   Respiratory:  Positive for sputum production and shortness of breath. Negative for cough, hemoptysis and wheezing.   Cardiovascular:  Negative for chest pain, palpitations, orthopnea, claudication and leg swelling.  Gastrointestinal:  Positive for heartburn. Negative for abdominal pain, nausea and vomiting.  Genitourinary: Negative.   Musculoskeletal:  Positive for joint pain. Negative for myalgias.  Skin:  Negative for rash.  Neurological:  Negative for weakness.  Endo/Heme/Allergies: Negative.   Psychiatric/Behavioral:  Positive for depression. The patient is nervous/anxious.    Objective:   Vitals:   11/09/22 1257  BP: 126/82  Pulse: 89  SpO2: 100%  Weight: 200 lb (90.7 kg)  Height: 5' (1.524 m)   Physical Exam Constitutional:      General: She is not in acute distress.    Appearance: She is obese. She is not ill-appearing.  HENT:     Head: Normocephalic and atraumatic.  Eyes:     General: No scleral icterus.    Conjunctiva/sclera: Conjunctivae  normal.     Pupils: Pupils are equal, round, and reactive to light.  Cardiovascular:     Rate and Rhythm: Normal rate and regular rhythm.     Pulses: Normal pulses.     Heart sounds: Normal heart sounds. No murmur heard. Pulmonary:     Effort: Pulmonary effort is normal.     Breath sounds: Normal breath sounds. No wheezing, rhonchi or rales.  Abdominal:     General: Bowel sounds are normal.     Palpations: Abdomen is soft.  Musculoskeletal:     Right lower leg: No edema.     Left lower leg: No edema.  Lymphadenopathy:     Cervical: No cervical adenopathy.  Skin:    General: Skin is warm and dry.  Neurological:     General: No focal deficit present.     Mental Status: She is alert.  Psychiatric:        Mood and Affect: Mood normal.        Behavior: Behavior normal.        Thought Content: Thought content normal.        Judgment: Judgment normal.     CBC    Component Value Date/Time   WBC 6.1 03/23/2019 1920   WBC 6.1 11/22/2017 1756   RBC 4.11 03/23/2019 1920   RBC 4.36 11/22/2017 1756   HGB 12.6 03/15/2020 0549   HGB 12.3 03/23/2019 1920   HCT 37.0 03/15/2020 0549   HCT 36.3 03/23/2019 1920   PLT 293 03/23/2019 1920   MCV 88 03/23/2019 1920   MCV 92 12/12/2012 0208   MCH 29.9 03/23/2019 1920   MCH 31.6 11/22/2017 1756   MCHC 33.9 03/23/2019 1920   MCHC 33.2 11/22/2017 1756   RDW 13.9 03/23/2019 1920  RDW 15.1 (H) 12/12/2012 0208   LYMPHSABS 2.6 03/23/2019 1920   EOSABS 0.1 03/23/2019 1920   BASOSABS 0.1 03/23/2019 1920      Latest Ref Rng & Units 01/20/2022    2:55 PM 11/26/2021   10:54 AM 07/29/2021    4:13 PM  BMP  Glucose 70 - 99 mg/dL 108  181    BUN 8 - 27 mg/dL 15  19    Creatinine 0.57 - 1.00 mg/dL 1.06  1.06  1.20   BUN/Creat Ratio 12 - '28 14  18    '$ Sodium 134 - 144 mmol/L 143  141    Potassium 3.5 - 5.2 mmol/L 3.8  4.2    Chloride 96 - 106 mmol/L 105  101    CO2 20 - 29 mmol/L 25  24    Calcium 8.7 - 10.3 mg/dL 9.7  9.8     Chest  imaging: [facility] CT Chest 09/24/22 1. Lung-RADS 1S, negative. Continue annual screening with low-dose chest CT without contrast in 12 months. S modifier for coronary artery calcifications. 2. Moderate coronary artery calcifications, recommend ASCVD risk assessment. 3. Aortic Atherosclerosis (ICD10-I70.0) and Emphysema (ICD10-J43.9).  PFT:     No data to display          Labs:  Path:  Echo:  Heart Catheterization:  Assessment & Plan:   Centrilobular emphysema ([place]) - Plan: Pulmonary Function Test, fluticasone-salmeterol (WIXELA INHUB) 250-50 MCG/ACT AEPB, albuterol (VENTOLIN HFA) 108 (90 Base) MCG/ACT inhaler  Cigarette smoker - Plan: buPROPion (WELLBUTRIN SR) 150 MG 12 hr tablet  Discussion: Alexandra Campos is a 67 year old woman, daily smoker with history of DMII, GERD, HLD and hypertension who is referred to pulmonary clinic for emphysema.  We will add wixella 250-30mg 1 puff twice daily to her Spiriva 2.552m 2 puffs daily given her on going respiratory symptoms. She can continue albuterol inhaler as needed.  Recommend smoking cessation with wellbutrin '150mg'$  twice daily and nicotine patches '14mg'$  daily.   Follow up in 3 months with pulmonary function testing.  JoFreda JacksonMD [facility] Office: [phone]  Current Outpatient Medications:    Acetaminophen (TYLENOL PO), Take by mouth., Disp: , Rfl:    atorvastatin (LIPITOR) 40 MG tablet, Take 1 tablet by mouth once daily, Disp: 90 tablet, Rfl: 0   benzocaine (ORAJEL) 10 % mucosal gel, Use as directed 1 application in the mouth or throat as needed for mouth pain., Disp: 5.3 g, Rfl: 0   blood glucose meter kit and supplies KIT, 1 each by Does not apply route daily. Dispense based on patient and insurance preference. Use up to four times daily as directed. (FOR ICD-9 250.00, 250.01)., Disp: 1 each, Rfl: 0   buPROPion (WELLBUTRIN SR) 150 MG 12 hr tablet, Take 1 tablet (150 mg total) by mouth 2  (two) times daily., Disp: 60 tablet, Rfl: 3   cetirizine (ZYRTEC) 10 MG tablet, Take 1 tablet (10 mg total) by mouth daily., Disp: 30 tablet, Rfl: 0   chlorthalidone (HYGROTON) 25 MG tablet, Take 1 tablet (25 mg total) by mouth daily., Disp: 90 tablet, Rfl: 3   diclofenac Sodium (VOLTAREN) 1 % GEL, APPLY 4 GRAMS  TOPICALLY 4 TIMES DAILY FOR 14 DAYS, Disp: 200 g, Rfl: 0   diclofenac Sodium (VOLTAREN) 1 % GEL, Apply 2 g topically 4 (four) times daily., Disp: 100 g, Rfl: 0   empagliflozin (JARDIANCE) 10 MG TABS tablet, Take 1 tablet (10 mg total) by mouth daily.,  Disp: 90 tablet, Rfl: 3   fluticasone-salmeterol (WIXELA INHUB) 250-50 MCG/ACT AEPB, Inhale 1 puff into the lungs in the morning and at bedtime., Disp: 60 each, Rfl: 5   glucose blood (ACCU-CHEK GUIDE) test strip, USE 1 STRIP TWICE DAILY FOR BLOOD SUGAR. E11.9, Disp: 100 each, Rfl: 0   Lancets Thin MISC, 1 each by Does not apply route as needed., Disp: 100 each, Rfl: 0   losartan (COZAAR) 50 MG tablet, TAKE 1 TABLET BY MOUTH ONCE DAILY . APPOINTMENT REQUIRED FOR FUTURE REFILLS, Disp: 90 tablet, Rfl: 0   metFORMIN (GLUCOPHAGE-XR) 500 MG 24 hr tablet, Take 2 tablets (1,000 mg total) by mouth in the morning and at bedtime., Disp: 360 tablet, Rfl: 3   Multiple Vitamin (MULTIVITAMIN PO), Take by mouth., Disp: , Rfl:    nystatin-triamcinolone ointment (MYCOLOG), Apply 1 Application topically 2 (two) times daily., Disp: 30 g, Rfl: 0   omeprazole (PRILOSEC) 20 MG capsule, Take 1 capsule by mouth once daily, Disp: 90 capsule, Rfl: 0   OZEMPIC, 0.25 OR 0.5 MG/DOSE, 2 MG/3ML SOPN, INJECT 0.5 MG INTO THE SKIN ONCE A WEEK, Disp: 3 mL, Rfl: 0   Tiotropium Bromide Monohydrate (SPIRIVA RESPIMAT) 2.5 MCG/ACT AERS, Inhale 2 puffs into the lungs daily., Disp: 4 g, Rfl: 3   albuterol (VENTOLIN HFA) 108 (90 Base) MCG/ACT inhaler, Inhale 2 puffs into the lungs every 4 (four) hours as needed for wheezing or shortness of breath., Disp: 1 each, Rfl: 0

## 2022-11-11 ENCOUNTER — Encounter (HOSPITAL_BASED_OUTPATIENT_CLINIC_OR_DEPARTMENT_OTHER): Payer: Self-pay | Admitting: Orthopaedic Surgery

## 2022-11-11 ENCOUNTER — Other Ambulatory Visit: Payer: Self-pay

## 2022-11-12 ENCOUNTER — Encounter: Payer: Self-pay | Admitting: Pulmonary Disease

## 2022-11-17 ENCOUNTER — Encounter (HOSPITAL_BASED_OUTPATIENT_CLINIC_OR_DEPARTMENT_OTHER)
Admission: RE | Admit: 2022-11-17 | Discharge: 2022-11-17 | Disposition: A | Discharge status: Home / Self Care | Payer: Medicare HMO | Source: Ambulatory Visit | Attending: Orthopaedic Surgery | Admitting: Orthopaedic Surgery

## 2022-11-17 DIAGNOSIS — E785 Hyperlipidemia, unspecified: Secondary | ICD-10-CM | POA: Diagnosis not present

## 2022-11-17 DIAGNOSIS — K219 Gastro-esophageal reflux disease without esophagitis: Secondary | ICD-10-CM | POA: Diagnosis not present

## 2022-11-17 DIAGNOSIS — Z7951 Long term (current) use of inhaled steroids: Secondary | ICD-10-CM | POA: Diagnosis not present

## 2022-11-17 DIAGNOSIS — Z79899 Other long term (current) drug therapy: Secondary | ICD-10-CM | POA: Diagnosis not present

## 2022-11-17 DIAGNOSIS — M1990 Unspecified osteoarthritis, unspecified site: Secondary | ICD-10-CM | POA: Diagnosis not present

## 2022-11-17 DIAGNOSIS — E1151 Type 2 diabetes mellitus with diabetic peripheral angiopathy without gangrene: Secondary | ICD-10-CM | POA: Diagnosis not present

## 2022-11-17 DIAGNOSIS — F17210 Nicotine dependence, cigarettes, uncomplicated: Secondary | ICD-10-CM | POA: Diagnosis not present

## 2022-11-17 DIAGNOSIS — G5601 Carpal tunnel syndrome, right upper limb: Secondary | ICD-10-CM | POA: Diagnosis not present

## 2022-11-17 DIAGNOSIS — Z01812 Encounter for preprocedural laboratory examination: Secondary | ICD-10-CM | POA: Diagnosis present

## 2022-11-17 DIAGNOSIS — F1290 Cannabis use, unspecified, uncomplicated: Secondary | ICD-10-CM | POA: Diagnosis not present

## 2022-11-17 DIAGNOSIS — I10 Essential (primary) hypertension: Secondary | ICD-10-CM | POA: Diagnosis not present

## 2022-11-17 DIAGNOSIS — Z7985 Long-term (current) use of injectable non-insulin antidiabetic drugs: Secondary | ICD-10-CM | POA: Diagnosis not present

## 2022-11-17 DIAGNOSIS — Z7984 Long term (current) use of oral hypoglycemic drugs: Secondary | ICD-10-CM | POA: Diagnosis not present

## 2022-11-17 LAB — BASIC METABOLIC PANEL
Anion gap: 12 (ref 5–15)
BUN: 25 mg/dL — ABNORMAL HIGH (ref 8–23)
CO2: 25 mmol/L (ref 22–32)
Calcium: 9.2 mg/dL (ref 8.9–10.3)
Chloride: 102 mmol/L (ref 98–111)
Creatinine, Ser: 1.28 mg/dL — ABNORMAL HIGH (ref 0.44–1.00)
GFR, Estimated: 46 mL/min — ABNORMAL LOW (ref >60)
Glucose, Bld: 179 mg/dL — ABNORMAL HIGH (ref 70–99)
Potassium: 3.1 mmol/L — ABNORMAL LOW (ref 3.5–5.1)
Sodium: 139 mmol/L (ref 135–145)

## 2022-11-17 NOTE — Progress Notes (Signed)

## 2022-11-18 ENCOUNTER — Encounter (HOSPITAL_BASED_OUTPATIENT_CLINIC_OR_DEPARTMENT_OTHER): Payer: Self-pay | Admitting: Orthopaedic Surgery

## 2022-11-18 ENCOUNTER — Encounter (HOSPITAL_BASED_OUTPATIENT_CLINIC_OR_DEPARTMENT_OTHER)
Admission: RE | Disposition: A | Discharge status: Home / Self Care | Payer: Self-pay | Source: Ambulatory Visit | Attending: Orthopaedic Surgery

## 2022-11-18 ENCOUNTER — Ambulatory Visit (HOSPITAL_BASED_OUTPATIENT_CLINIC_OR_DEPARTMENT_OTHER)
Admission: RE | Admit: 2022-11-18 | Discharge: 2022-11-18 | Disposition: A | Discharge status: Home / Self Care | Payer: Medicare HMO | Source: Ambulatory Visit | Attending: Orthopaedic Surgery | Admitting: Orthopaedic Surgery

## 2022-11-18 ENCOUNTER — Ambulatory Visit (HOSPITAL_BASED_OUTPATIENT_CLINIC_OR_DEPARTMENT_OTHER): Payer: Medicare HMO | Admitting: Anesthesiology

## 2022-11-18 ENCOUNTER — Other Ambulatory Visit: Payer: Self-pay

## 2022-11-18 DIAGNOSIS — I10 Essential (primary) hypertension: Secondary | ICD-10-CM | POA: Diagnosis not present

## 2022-11-18 DIAGNOSIS — F1290 Cannabis use, unspecified, uncomplicated: Secondary | ICD-10-CM | POA: Insufficient documentation

## 2022-11-18 DIAGNOSIS — Z7984 Long term (current) use of oral hypoglycemic drugs: Secondary | ICD-10-CM | POA: Insufficient documentation

## 2022-11-18 DIAGNOSIS — M1990 Unspecified osteoarthritis, unspecified site: Secondary | ICD-10-CM | POA: Diagnosis not present

## 2022-11-18 DIAGNOSIS — G5601 Carpal tunnel syndrome, right upper limb: Secondary | ICD-10-CM | POA: Diagnosis not present

## 2022-11-18 DIAGNOSIS — Z7951 Long term (current) use of inhaled steroids: Secondary | ICD-10-CM | POA: Diagnosis not present

## 2022-11-18 DIAGNOSIS — Z7985 Long-term (current) use of injectable non-insulin antidiabetic drugs: Secondary | ICD-10-CM | POA: Insufficient documentation

## 2022-11-18 DIAGNOSIS — E1151 Type 2 diabetes mellitus with diabetic peripheral angiopathy without gangrene: Secondary | ICD-10-CM | POA: Insufficient documentation

## 2022-11-18 DIAGNOSIS — F17210 Nicotine dependence, cigarettes, uncomplicated: Secondary | ICD-10-CM | POA: Insufficient documentation

## 2022-11-18 DIAGNOSIS — E785 Hyperlipidemia, unspecified: Secondary | ICD-10-CM | POA: Insufficient documentation

## 2022-11-18 DIAGNOSIS — F17200 Nicotine dependence, unspecified, uncomplicated: Secondary | ICD-10-CM | POA: Diagnosis not present

## 2022-11-18 DIAGNOSIS — K219 Gastro-esophageal reflux disease without esophagitis: Secondary | ICD-10-CM | POA: Insufficient documentation

## 2022-11-18 DIAGNOSIS — Z79899 Other long term (current) drug therapy: Secondary | ICD-10-CM | POA: Insufficient documentation

## 2022-11-18 DIAGNOSIS — N183 Chronic kidney disease, stage 3 unspecified: Secondary | ICD-10-CM

## 2022-11-18 HISTORY — PX: CARPAL TUNNEL RELEASE: SHX101

## 2022-11-18 LAB — GLUCOSE, CAPILLARY
Glucose-Capillary: 156 mg/dL — ABNORMAL HIGH (ref 70–99)
Glucose-Capillary: 167 mg/dL — ABNORMAL HIGH (ref 70–99)

## 2022-11-18 SURGERY — CARPAL TUNNEL RELEASE
Anesthesia: Monitor Anesthesia Care | Site: Wrist | Laterality: Right

## 2022-11-18 MED ORDER — CEFAZOLIN SODIUM-DEXTROSE 2-4 GM/100ML-% IV SOLN
INTRAVENOUS | Status: AC
  Filled 2022-11-18: qty 100

## 2022-11-18 MED ORDER — LIDOCAINE HCL (PF) 1 % IJ SOLN
INTRAMUSCULAR | Status: AC
  Filled 2022-11-18: qty 30

## 2022-11-18 MED ORDER — LIDOCAINE 2% (20 MG/ML) 5 ML SYRINGE
INTRAMUSCULAR | Status: AC
  Filled 2022-11-18: qty 5

## 2022-11-18 MED ORDER — TRAMADOL HCL 50 MG PO TABS: 50.0000 mg | ORAL_TABLET | Freq: Every day | ORAL | 0 refills | Status: DC | PRN

## 2022-11-18 MED ORDER — MIDAZOLAM HCL 2 MG/2ML IJ SOLN
INTRAMUSCULAR | Status: AC
  Filled 2022-11-18: qty 2

## 2022-11-18 MED ORDER — ONDANSETRON HCL 4 MG/2ML IJ SOLN
INTRAMUSCULAR | Status: DC | PRN
  Administered 2022-11-18: 4 mg via INTRAVENOUS

## 2022-11-18 MED ORDER — LIDOCAINE-EPINEPHRINE 1 %-1:100000 IJ SOLN
INTRAMUSCULAR | Status: AC
  Filled 2022-11-18: qty 1

## 2022-11-18 MED ORDER — FENTANYL CITRATE (PF) 100 MCG/2ML IJ SOLN
INTRAMUSCULAR | Status: AC
  Filled 2022-11-18: qty 2

## 2022-11-18 MED ORDER — ACETAMINOPHEN 160 MG/5ML PO SOLN: 325.0000 mg | ORAL | Status: DC | PRN

## 2022-11-18 MED ORDER — PROPOFOL 10 MG/ML IV BOLUS
INTRAVENOUS | Status: AC
  Filled 2022-11-18: qty 20

## 2022-11-18 MED ORDER — OXYCODONE HCL 5 MG/5ML PO SOLN: 5.0000 mg | Freq: Once | ORAL | Status: AC | PRN

## 2022-11-18 MED ORDER — CEFAZOLIN SODIUM-DEXTROSE 2-4 GM/100ML-% IV SOLN
2.0000 g | INTRAVENOUS | Status: AC
  Administered 2022-11-18: 2 g via INTRAVENOUS

## 2022-11-18 MED ORDER — MIDAZOLAM HCL 5 MG/5ML IJ SOLN
INTRAMUSCULAR | Status: DC | PRN
  Administered 2022-11-18 (×2): 1 mg via INTRAVENOUS

## 2022-11-18 MED ORDER — MEPERIDINE HCL 25 MG/ML IJ SOLN: 6.2500 mg | INTRAMUSCULAR | Status: DC | PRN

## 2022-11-18 MED ORDER — PROPOFOL 500 MG/50ML IV EMUL
INTRAVENOUS | Status: AC
  Filled 2022-11-18: qty 50

## 2022-11-18 MED ORDER — BUPIVACAINE HCL (PF) 0.5 % IJ SOLN
INTRAMUSCULAR | Status: AC
  Filled 2022-11-18: qty 30

## 2022-11-18 MED ORDER — 0.9 % SODIUM CHLORIDE (POUR BTL) OPTIME
TOPICAL | Status: DC | PRN
  Administered 2022-11-18: 300 mL

## 2022-11-18 MED ORDER — PROPOFOL 500 MG/50ML IV EMUL
INTRAVENOUS | Status: DC | PRN
  Administered 2022-11-18: 50 ug/kg/min via INTRAVENOUS

## 2022-11-18 MED ORDER — LIDOCAINE HCL (CARDIAC) PF 100 MG/5ML IV SOSY
PREFILLED_SYRINGE | INTRAVENOUS | Status: DC | PRN
  Administered 2022-11-18: 50 mg via INTRAVENOUS

## 2022-11-18 MED ORDER — BUPIVACAINE HCL (PF) 0.25 % IJ SOLN
INTRAMUSCULAR | Status: AC
  Filled 2022-11-18: qty 120

## 2022-11-18 MED ORDER — FENTANYL CITRATE (PF) 100 MCG/2ML IJ SOLN
INTRAMUSCULAR | Status: DC | PRN
  Administered 2022-11-18 (×2): 50 ug via INTRAVENOUS

## 2022-11-18 MED ORDER — OXYCODONE HCL 5 MG PO TABS
5.0000 mg | ORAL_TABLET | Freq: Once | ORAL | Status: AC | PRN
  Administered 2022-11-18: 5 mg via ORAL

## 2022-11-18 MED ORDER — PROPOFOL 10 MG/ML IV BOLUS
INTRAVENOUS | Status: DC | PRN
  Administered 2022-11-18: 20 mg via INTRAVENOUS

## 2022-11-18 MED ORDER — HYDROCODONE-ACETAMINOPHEN 5-325 MG PO TABS: 0.5000 | ORAL_TABLET | Freq: Four times a day (QID) | ORAL | 0 refills | Status: DC | PRN

## 2022-11-18 MED ORDER — ONDANSETRON HCL 4 MG/2ML IJ SOLN: 4.0000 mg | Freq: Once | INTRAMUSCULAR | Status: DC | PRN

## 2022-11-18 MED ORDER — OXYCODONE HCL 5 MG PO TABS
ORAL_TABLET | ORAL | Status: AC
  Filled 2022-11-18: qty 1

## 2022-11-18 MED ORDER — LACTATED RINGERS IV SOLN: INTRAVENOUS | Status: DC

## 2022-11-18 MED ORDER — BUPIVACAINE-EPINEPHRINE (PF) 0.25% -1:200000 IJ SOLN
INTRAMUSCULAR | Status: AC
  Filled 2022-11-18: qty 30

## 2022-11-18 MED ORDER — ACETAMINOPHEN 325 MG PO TABS: 325.0000 mg | ORAL_TABLET | ORAL | Status: DC | PRN

## 2022-11-18 MED ORDER — FENTANYL CITRATE (PF) 100 MCG/2ML IJ SOLN
25.0000 ug | INTRAMUSCULAR | Status: DC | PRN
  Administered 2022-11-18: 50 ug via INTRAVENOUS

## 2022-11-18 MED ORDER — ONDANSETRON HCL 4 MG/2ML IJ SOLN
INTRAMUSCULAR | Status: AC
  Filled 2022-11-18: qty 2

## 2022-11-18 MED ORDER — BUPIVACAINE HCL (PF) 0.25 % IJ SOLN
INTRAMUSCULAR | Status: DC | PRN
  Administered 2022-11-18: 5 mL

## 2022-11-18 MED ORDER — LIDOCAINE-EPINEPHRINE (PF) 1 %-1:200000 IJ SOLN
INTRAMUSCULAR | Status: DC | PRN
  Administered 2022-11-18: 5 mL

## 2022-11-18 SURGICAL SUPPLY — 48 items
BAND INSRT 18 STRL LF DISP RB (MISCELLANEOUS) ×2
BAND RUBBER #18 3X1/16 STRL (MISCELLANEOUS) ×2 IMPLANT
BLADE MINI RND TIP GREEN BEAV (BLADE) ×1 IMPLANT
BLADE SURG 15 STRL LF DISP TIS (BLADE) ×1 IMPLANT
BLADE SURG 15 STRL SS (BLADE) ×1
BNDG CMPR 5X3 KNIT ELC UNQ LF (GAUZE/BANDAGES/DRESSINGS) ×1
BNDG CMPR 9X4 STRL LF SNTH (GAUZE/BANDAGES/DRESSINGS) ×1
BNDG ELASTIC 3INX 5YD STR LF (GAUZE/BANDAGES/DRESSINGS) ×1 IMPLANT
BNDG ESMARK 4X9 LF (GAUZE/BANDAGES/DRESSINGS) ×1 IMPLANT
BNDG PLASTER X FAST 3X3 WHT LF (CAST SUPPLIES) IMPLANT
BNDG PLSTR 9X3 FST ST WHT (CAST SUPPLIES)
BRUSH SCRUB EZ PLAIN DRY (MISCELLANEOUS) ×1 IMPLANT
CANISTER SUCT 1200ML W/VALVE (MISCELLANEOUS) ×1 IMPLANT
CORD BIPOLAR FORCEPS 12FT (ELECTRODE) ×1 IMPLANT
COVER BACK TABLE 60X90IN (DRAPES) ×1 IMPLANT
COVER MAYO STAND STRL (DRAPES) ×1 IMPLANT
CUFF TOURN SGL QUICK 18X4 (TOURNIQUET CUFF) IMPLANT
DRAPE EXTREMITY T 121X128X90 (DISPOSABLE) ×1 IMPLANT
DRAPE IMP U-DRAPE 54X76 (DRAPES) ×1 IMPLANT
DRAPE SURG 17X23 STRL (DRAPES) ×1 IMPLANT
GAUZE 4X4 16PLY ~~LOC~~+RFID DBL (SPONGE) IMPLANT
GAUZE SPONGE 4X4 12PLY STRL (GAUZE/BANDAGES/DRESSINGS) ×1 IMPLANT
GAUZE XEROFORM 1X8 LF (GAUZE/BANDAGES/DRESSINGS) ×1 IMPLANT
GLOVE BIOGEL PI IND STRL 7.5 (GLOVE) ×1 IMPLANT
GLOVE ECLIPSE 7.0 STRL STRAW (GLOVE) ×1 IMPLANT
GLOVE INDICATOR 7.0 STRL GRN (GLOVE) ×1 IMPLANT
GLOVE SURG SYN 7.5  E (GLOVE) ×1
GLOVE SURG SYN 7.5 E (GLOVE) ×1 IMPLANT
GLOVE SURG SYN 7.5 PF PI (GLOVE) ×1 IMPLANT
GOWN STRL REUS W/ TWL LRG LVL3 (GOWN DISPOSABLE) ×1 IMPLANT
GOWN STRL REUS W/TWL LRG LVL3 (GOWN DISPOSABLE) ×1
GOWN STRL SURGICAL XL XLNG (GOWN DISPOSABLE) ×2 IMPLANT
NDL HYPO 25X1 1.5 SAFETY (NEEDLE) IMPLANT
NEEDLE HYPO 25X1 1.5 SAFETY (NEEDLE) IMPLANT
NS IRRIG 1000ML POUR BTL (IV SOLUTION) ×1 IMPLANT
PACK BASIN DAY SURGERY FS (CUSTOM PROCEDURE TRAY) ×1 IMPLANT
PAD CAST 3X4 CTTN HI CHSV (CAST SUPPLIES) ×1 IMPLANT
PADDING CAST COTTON 3X4 STRL (CAST SUPPLIES) ×1
SHEET MEDIUM DRAPE 40X70 STRL (DRAPES) ×1 IMPLANT
SPIKE FLUID TRANSFER (MISCELLANEOUS) IMPLANT
STOCKINETTE 4X48 STRL (DRAPES) ×1 IMPLANT
SUT ETHILON 3 0 PS 1 (SUTURE) ×1 IMPLANT
SYR BULB EAR ULCER 3OZ GRN STR (SYRINGE) ×1 IMPLANT
SYR CONTROL 10ML LL (SYRINGE) IMPLANT
TOWEL GREEN STERILE FF (TOWEL DISPOSABLE) ×1 IMPLANT
TRAY DSU PREP LF (CUSTOM PROCEDURE TRAY) ×1 IMPLANT
TUBE CONNECTING 20X1/4 (TUBING) IMPLANT
UNDERPAD 30X36 HEAVY ABSORB (UNDERPADS AND DIAPERS) ×1 IMPLANT

## 2022-11-18 NOTE — Op Note (Signed)
   Carpal tunnel op note  DATE OF SURGERY:11/18/2022  PREOPERATIVE DIAGNOSIS:  Right carpal tunnel syndrome  POSTOPERATIVE DIAGNOSIS: same  PROCEDURE: Right carpal tunnel release. CPT [id number]  SURGEON: Marianna Payment, M.D.  ASSIST: Madalyn Rob, [place]  ANESTHESIA:  Local and MAC  TOURNIQUET TIME: less than 20 minutes  BLOOD LOSS: Minimal.  COMPLICATIONS: None.  PATHOLOGY: None.  INDICATIONS: The patient is a 67 y.o. -year-old female who presented with carpal tunnel syndrome failing nonsurgical management, indicated for surgical release.  DESCRIPTION OF PROCEDURE: The patient was identified in the preoperative holding area.  The operative site was marked by the surgeon and confirmed by the patient.  The patient was brought back to the operating room.  MAC anesthesia was administered.  Local anesthetic with epi was injected into the operative site.  A well padded nonsterile tourniquet was placed. The operative extremity was prepped and draped in standard sterile fashion.  A timeout was performed.  Preoperative antibiotics were given.   A palmar incision was made about 5 mm ulnar to the thenar crease.  The palmar aponeurosis was exposed and divided in line with the skin incision. The palmaris brevis was visualized and divided.  The distal edge of the transcarpal ligament was identified. A hemostat was inserted into the carpal tunnel to protect the median nerve and the flexor tendons. Then, the transverse carpal ligament was released under direct visualization. Proximally, a subcutaneous tunnel was made allowing a Sewell retractor to be placed. Then, the distal portion of the antebrachial fascia was released. Distally, all fibrous bands were released. The median nerve was visualized, and the fat pad was exposed. Wound was irrigated and closed with 4-0 nylon sutures. Sterile dressing applied. The patient was transferred to the recovery room in stable condition after all counts were  correct.  POSTOPERATIVE PLAN: To start nerve gliding exercises as tolerated and no heavy lifting for four weeks.  Eduard Roux, M.D. [facility] 12:39 PM

## 2022-11-18 NOTE — Transfer of Care (Signed)
Immediate Anesthesia Transfer of Care Note  Patient: Alexandra Campos  Procedure(s) Performed: RIGHT CARPAL TUNNEL RELEASE (Right: Wrist)  Patient Location: PACU  Anesthesia Type:MAC  Level of Consciousness: awake, alert , and oriented  Airway & Oxygen Therapy: Patient Spontanous Breathing and Patient connected to face mask oxygen  Post-op Assessment: Report given to RN and Post -op Vital signs reviewed and stable  Post vital signs: Reviewed and stable  Last Vitals:  Vitals Value Taken Time  BP 151/91 11/18/22 1249  Temp    Pulse 80 11/18/22 1249  Resp 25 11/18/22 1249  SpO2 100 % 11/18/22 1249  Vitals shown include unvalidated device data.  Last Pain:  Vitals:   11/18/22 1007  TempSrc: Oral  PainSc: 0-No pain      Patients Stated Pain Goal: 3 ([phone] [id number])  Complications: No notable events documented.

## 2022-11-18 NOTE — H&P (Signed)
PREOPERATIVE H&P  Chief Complaint: right carpal tunnel syndrome  HPI: Alexandra Campos is a 67 y.o. female who presents for surgical treatment of right carpal tunnel syndrome.  She denies any changes in medical history.  Past Medical History:  Diagnosis Date   Arthritis    Diabetes mellitus without complication ([place])    GERD (gastroesophageal reflux disease)    Hyperlipidemia    Hypertension    Past Surgical History:  Procedure Laterality Date   ABDOMINAL AORTOGRAM W/LOWER EXTREMITY N/A 03/15/2020   Procedure: ABDOMINAL AORTOGRAM W/LOWER EXTREMITY;  Surgeon: Angelia Mould, MD;  Location: [facility];  Service: Cardiovascular;  Laterality: N/A;   ANKLE SURGERY Right    INDUCED ABORTION     LYMPHADENECTOMY     Social History   Socioeconomic History   Marital status: Single    Spouse name: Not on file   Number of children: 2   Years of education: 12   Highest education level: 12th grade  Occupational History   Occupation: CNA    Comment: Retired  Tobacco Use   Smoking status: Every Day    Packs/day: 0.50    Types: Cigarettes    Passive exposure: Current   Smokeless tobacco: Never  Vaping Use   Vaping Use: Never used  Substance and Sexual Activity   Alcohol use: Yes    Alcohol/week: 5.0 standard drinks of alcohol    Types: 5 Cans of beer per week    Comment: 5 drinks/week    Drug use: Yes    Types: Marijuana    Comment: last smoked cig today, last marijuana 11/16/2022   Sexual activity: Not on file  Other Topics Concern   Not on file  Social History Narrative   Patient lives alone in an independent living facility.    Patient walks the parameter daily ~10 minutes.    Patient enjoys seeing her neighbors.    Patient has one son and one daughter who passed away.    Social Determinants of Health   Financial Resource Strain: Low Risk  (11/04/2021)   Overall Financial Resource Strain (CARDIA)    Difficulty of Paying Living Expenses: Not very hard   Food Insecurity: No Food Insecurity (11/04/2021)   Hunger Vital Sign    Worried About Running Out of Food in the Last Year: Never true    Ran Out of Food in the Last Year: Never true  Transportation Needs: No Transportation Needs (11/04/2021)   PRAPARE - Hydrologist (Medical): No    Lack of Transportation (Non-Medical): No  Physical Activity: Insufficiently Active (11/04/2021)   Exercise Vital Sign    Days of Exercise per Week: 7 days    Minutes of Exercise per Session: 10 min  Stress: Stress Concern Present (11/04/2021)   [place]    Feeling of Stress : To some extent  Social Connections: Moderately Isolated (11/04/2021)   Social Connection and Isolation Panel [NHANES]    Frequency of Communication with Friends and Family: More than three times a week    Frequency of Social Gatherings with Friends and Family: More than three times a week    Attends Religious Services: More than 4 times per year    Active Member of [employer] or Organizations: No    Attends Archivist Meetings: Never    Marital Status: Never married   Family History  Problem Relation Age of Onset   Diabetes  Mother    Hypertension Mother    Diabetes Father    Hypertension Father    Cancer Father    Diabetes Sister    Diabetes Sister    Diabetes Brother    Hypertension Brother    Colon cancer Brother 57   Diabetes Brother    Diabetes Daughter    Hypertension Daughter    Diabetes Son    Cancer Paternal Uncle    Diabetes Maternal Grandmother    Hypertension Maternal Grandmother    Colon polyps Neg Hx    Esophageal cancer Neg Hx    Rectal cancer Neg Hx    Stomach cancer Neg Hx    No Known Allergies Prior to Admission medications   Medication Sig Start Date End Date Taking? Authorizing Provider  Acetaminophen (TYLENOL PO) Take by mouth.   Yes [provider]  albuterol (VENTOLIN HFA) 108 (90 Base)  MCG/ACT inhaler Inhale 2 puffs into the lungs every 4 (four) hours as needed for wheezing or shortness of breath. 11/09/22  Yes Freddi Starr, MD  atorvastatin (LIPITOR) 40 MG tablet Take 1 tablet by mouth once daily 10/16/22  Yes Dameron, Luna Fuse, DO  buPROPion (WELLBUTRIN SR) 150 MG 12 hr tablet Take 1 tablet (150 mg total) by mouth 2 (two) times daily. 11/09/22  Yes Freddi Starr, MD  cetirizine (ZYRTEC) 10 MG tablet Take 1 tablet (10 mg total) by mouth daily. 12/06/21  Yes Raspet, Erin K, PA-C  chlorthalidone (HYGROTON) 25 MG tablet Take 1 tablet (25 mg total) by mouth daily. 01/20/22  Yes Lattie Haw, MD  empagliflozin (JARDIANCE) 10 MG TABS tablet Take 1 tablet (10 mg total) by mouth daily. 10/06/22  Yes Dameron, Luna Fuse, DO  fluticasone-salmeterol (WIXELA INHUB) 250-50 MCG/ACT AEPB Inhale 1 puff into the lungs in the morning and at bedtime. 11/09/22  Yes Freddi Starr, MD  losartan (COZAAR) 50 MG tablet TAKE 1 TABLET BY MOUTH ONCE DAILY . APPOINTMENT REQUIRED FOR FUTURE REFILLS 10/16/22  Yes Dameron, Luna Fuse, DO  metFORMIN (GLUCOPHAGE-XR) 500 MG 24 hr tablet Take 2 tablets (1,000 mg total) by mouth in the morning and at bedtime. 03/30/22  Yes Dameron, Luna Fuse, DO  Multiple Vitamin (MULTIVITAMIN PO) Take by mouth.   Yes [provider]  omeprazole (PRILOSEC) 20 MG capsule Take 1 capsule by mouth once daily 10/16/22  Yes Dameron, Luna Fuse, DO  OZEMPIC, 0.25 OR 0.5 MG/DOSE, 2 MG/3ML SOPN INJECT 0.5 MG INTO THE SKIN ONCE A WEEK 10/26/22  Yes Dameron, Luna Fuse, DO  Tiotropium Bromide Monohydrate (SPIRIVA RESPIMAT) 2.5 MCG/ACT AERS Inhale 2 puffs into the lungs daily. 10/08/22  Yes Dameron, Luna Fuse, DO  Turmeric (QC TUMERIC COMPLEX PO) Take by mouth.   Yes [provider]  benzocaine (ORAJEL) 10 % mucosal gel Use as directed 1 application in the mouth or throat as needed for mouth pain. 02/26/21   Sharion Settler, DO  blood glucose meter kit and supplies KIT 1 each by Does not apply  route daily. Dispense based on patient and insurance preference. Use up to four times daily as directed. (FOR ICD-9 250.00, 250.01). 08/17/19   Wilber Oliphant, MD  diclofenac Sodium (VOLTAREN) 1 % GEL APPLY 4 GRAMS  TOPICALLY 4 TIMES DAILY FOR 14 DAYS 11/12/20   Richarda Osmond, MD  diclofenac Sodium (VOLTAREN) 1 % GEL Apply 2 g topically 4 (four) times daily. 07/23/22   Dameron, Luna Fuse, DO  glucose blood (ACCU-CHEK GUIDE) test strip USE 1 STRIP TWICE DAILY FOR BLOOD SUGAR. E11.9  02/12/22   Lattie Haw, MD  Lancets Thin MISC 1 each by Does not apply route as needed. 06/10/20   Wilber Oliphant, MD  nystatin-triamcinolone ointment [facility]) Apply 1 Application topically 2 (two) times daily. 05/25/22   Dameron, Luna Fuse, DO     Positive ROS: All other systems have been reviewed and were otherwise negative with the exception of those mentioned in the HPI and as above.  Physical Exam: General: Alert, no acute distress Cardiovascular: No pedal edema Respiratory: No cyanosis, no use of accessory musculature GI: abdomen soft Skin: No lesions in the area of chief complaint Neurologic: Sensation intact distally Psychiatric: Patient is competent for consent with normal mood and affect Lymphatic: no lymphedema  MUSCULOSKELETAL: exam stable  Assessment: right carpal tunnel syndrome  Plan: Plan for Procedure(s): RIGHT CARPAL TUNNEL RELEASE  The risks benefits and alternatives were discussed with the patient including but not limited to the risks of nonoperative treatment, versus surgical intervention including infection, bleeding, nerve injury,  blood clots, cardiopulmonary complications, morbidity, mortality, among others, and they were willing to proceed.   Eduard Roux, MD 11/18/2022 11:59 AM

## 2022-11-18 NOTE — Anesthesia Preprocedure Evaluation (Addendum)
Anesthesia Evaluation  Patient identified by MRN, date of birth, ID band Patient awake    Reviewed: Allergy & Precautions, H&P , NPO status , Patient's Chart, lab work & pertinent test results  Airway Mallampati: II  TM Distance: >3 FB Neck ROM: Full    Dental no notable dental hx. (+) Poor Dentition, Chipped, Missing, Dental Advisory Given   Pulmonary Current Smoker   Pulmonary exam normal breath sounds clear to auscultation       Cardiovascular Exercise Tolerance: Good hypertension, Pt. on medications + Peripheral Vascular Disease  Normal cardiovascular exam Rhythm:Regular Rate:Normal     Neuro/Psych  Neuromuscular disease negative neurological ROS  negative psych ROS   GI/Hepatic negative GI ROS, Neg liver ROS,GERD  ,,  Endo/Other  diabetes    Renal/GU negative Renal ROS  negative genitourinary   Musculoskeletal negative musculoskeletal ROS (+) Arthritis , Osteoarthritis,    Abdominal   Peds negative pediatric ROS (+)  Hematology negative hematology ROS (+)   Anesthesia Other Findings   Reproductive/Obstetrics negative OB ROS                              Anesthesia Physical Anesthesia Plan  ASA: 3  Anesthesia Plan: MAC   Post-op Pain Management: Minimal or no pain anticipated, Tylenol PO (pre-op)* and Celebrex PO (pre-op)*   Induction: Intravenous  PONV Risk Score and Plan: 1 and Propofol infusion  Airway Management Planned: LMA  Additional Equipment: None  Intra-op Plan:   Post-operative Plan: Extubation in OR  Informed Consent: I have reviewed the patients History and Physical, chart, labs and discussed the procedure including the risks, benefits and alternatives for the proposed anesthesia with the patient or authorized representative who has indicated his/her understanding and acceptance.       Plan Discussed with: Anesthesiologist and CRNA  Anesthesia Plan  Comments:          Anesthesia Quick Evaluation

## 2022-11-18 NOTE — Anesthesia Postprocedure Evaluation (Signed)
Anesthesia Post Note  Patient: Alexandra Campos  Procedure(s) Performed: RIGHT CARPAL TUNNEL RELEASE (Right: Wrist)     Patient location during evaluation: PACU Anesthesia Type: MAC Level of consciousness: awake and alert Pain management: pain level controlled Vital Signs Assessment: post-procedure vital signs reviewed and stable Respiratory status: spontaneous breathing, nonlabored ventilation, respiratory function stable and patient connected to nasal cannula oxygen Cardiovascular status: stable and blood pressure returned to baseline Postop Assessment: no apparent nausea or vomiting Anesthetic complications: no  No notable events documented.  Last Vitals:  Vitals:   11/18/22 1315 11/18/22 1346  BP: (!) 157/95 (!) 154/79  Pulse: 79 80  Resp: 19 18  Temp:  (!) 36.3 C  SpO2: 96% 95%    Last Pain:  Vitals:   11/18/22 1356  TempSrc:   PainSc: 6                  [place]et [place]

## 2022-11-18 NOTE — Discharge Instructions (Signed)

## 2022-11-19 ENCOUNTER — Other Ambulatory Visit: Payer: Self-pay | Admitting: Physician Assistant

## 2022-11-19 ENCOUNTER — Encounter (HOSPITAL_BASED_OUTPATIENT_CLINIC_OR_DEPARTMENT_OTHER): Payer: Self-pay | Admitting: Orthopaedic Surgery

## 2022-11-19 ENCOUNTER — Telehealth: Payer: Self-pay | Admitting: Orthopaedic Surgery

## 2022-11-19 NOTE — Telephone Encounter (Signed)
Called patient. She will try Ibuprofen this afternoon when her family bring her some.

## 2022-11-19 NOTE — Telephone Encounter (Signed)
Please call patient she states her medication is not helping her pain

## 2022-11-19 NOTE — Telephone Encounter (Signed)
I would have her also take ibuprofen, but do not exceed recommendation on the bottle

## 2022-11-22 ENCOUNTER — Other Ambulatory Visit: Payer: Self-pay | Admitting: Student

## 2022-11-26 ENCOUNTER — Ambulatory Visit (INDEPENDENT_AMBULATORY_CARE_PROVIDER_SITE_OTHER): Payer: Medicare HMO | Admitting: Physician Assistant

## 2022-11-26 DIAGNOSIS — Z9889 Other specified postprocedural states: Secondary | ICD-10-CM | POA: Diagnosis not present

## 2022-11-26 NOTE — Progress Notes (Signed)
Post-Op Visit Note   Patient: Alexandra Campos           Date of Birth: 04-Jan-1956           MRN: [id number] Visit Date: 11/26/2022 PCP: Orvis Brill, DO   Assessment & Plan:  Chief Complaint:  Chief Complaint  Patient presents with   Right Hand - Routine Post Op   Visit Diagnoses:  1. S/P carpal tunnel release     Plan: Patient is a pleasant 67 year old female who comes in today 1 week status post right carpal tunnel release 11/18/2022.  She is doing well.  Pain is much better controlled and is only taking ibuprofen at this time.  She denies any paresthesias to the right hand.  Examination of her right hand reveals a well-healing surgical incision with nylon sutures in place.  No evidence of infection or cellulitis.  Fingers are warm and well-perfused.  She is neurovascularly intact distally.  Today, her wound was cleaned and covered with a waterproof bandage.  Velcro splint applied that she will wear at all times for the next week.  She will follow-up next week for repeat evaluation and suture removal.  Call with concerns or questions in the meantime.  Follow-Up Instructions: Return in about 1 week (around 12/03/2022).   Orders:  No orders of the defined types were placed in this encounter.  No orders of the defined types were placed in this encounter.   Imaging: No results found.  PMFS History: Patient Active Problem List   Diagnosis Date Noted   Obstructive lung disease ([place]) 07/09/2022   Rash 04/20/2022   Right upper quadrant pain 01/21/2022   Wheezing 12/13/2020   Low back pain 09/22/2020   Yeast infection involving the vagina and surrounding area 06/10/2020   Complex cyst of right ovary 04/09/2020   Atherosclerosis of artery of extremity with rest pain ([place]) 03/15/2020   Ovarian cyst 03/09/2020   Postmenopausal bleeding 02/07/2020   Abnormal ankle brachial index (ABI) 01/14/2020   Abnormal facial hair 11/24/2019   Stress incontinence of urine 10/03/2019   Type 2  DM with CKD stage 3 and hypertension ([place]) 07/03/2019   Class 3 severe obesity with body mass index (BMI) of 40.0 to 44.9 in adult ([place]) 06/16/2019   CKD (chronic kidney disease) stage 3, GFR 30-59 ml/min ([place]) 06/16/2019   Healthcare maintenance 06/16/2019   Carpal tunnel syndrome on right 06/16/2019   Hypertension 08/04/2018   Tobacco dependence 08/04/2018   Past Medical History:  Diagnosis Date   Arthritis    Diabetes mellitus without complication ([place])    GERD (gastroesophageal reflux disease)    Hyperlipidemia    Hypertension     Family History  Problem Relation Age of Onset   Diabetes Mother    Hypertension Mother    Diabetes Father    Hypertension Father    Cancer Father    Diabetes Sister    Diabetes Sister    Diabetes Brother    Hypertension Brother    Colon cancer Brother 37   Diabetes Brother    Diabetes Daughter    Hypertension Daughter    Diabetes Son    Cancer Paternal Uncle    Diabetes Maternal Grandmother    Hypertension Maternal Grandmother    Colon polyps Neg Hx    Esophageal cancer Neg Hx    Rectal cancer Neg Hx    Stomach cancer Neg Hx     Past Surgical History:  Procedure Laterality Date   ABDOMINAL  AORTOGRAM W/LOWER EXTREMITY N/A 03/15/2020   Procedure: ABDOMINAL AORTOGRAM W/LOWER EXTREMITY;  Surgeon: Angelia Mould, MD;  Location: [facility];  Service: Cardiovascular;  Laterality: N/A;   ANKLE SURGERY Right    CARPAL TUNNEL RELEASE Right 11/18/2022   Procedure: RIGHT CARPAL TUNNEL RELEASE;  Surgeon: Leandrew Koyanagi, MD;  Location: [facility];  Service: Orthopedics;  Laterality: Right;   INDUCED ABORTION     LYMPHADENECTOMY     Social History   Occupational History   Occupation: CNA    Comment: Retired  Tobacco Use   Smoking status: Every Day    Packs/day: .5    Types: Cigarettes    Passive exposure: Current   Smokeless tobacco: Never  Vaping Use   Vaping Use: Never used  Substance and Sexual Activity    Alcohol use: Yes    Alcohol/week: 5.0 standard drinks of alcohol    Types: 5 Cans of beer per week    Comment: 5 drinks/week    Drug use: Yes    Types: Marijuana    Comment: last smoked cig today, last marijuana 11/16/2022   Sexual activity: Not on file

## 2022-12-03 ENCOUNTER — Ambulatory Visit (INDEPENDENT_AMBULATORY_CARE_PROVIDER_SITE_OTHER): Payer: Medicare HMO | Admitting: Orthopaedic Surgery

## 2022-12-03 ENCOUNTER — Encounter: Payer: Self-pay | Admitting: Orthopaedic Surgery

## 2022-12-03 DIAGNOSIS — Z9889 Other specified postprocedural states: Secondary | ICD-10-CM

## 2022-12-03 NOTE — Progress Notes (Signed)
Post-Op Visit Note   Patient: Alexandra Campos           Date of Birth: 1956/09/02           MRN: [id number] Visit Date: 12/03/2022 PCP: Orvis Brill, DO   Assessment & Plan:  Chief Complaint:  Chief Complaint  Patient presents with   Right Wrist - Follow-up    Right carpal tunnel release 11/18/2022   Visit Diagnoses:  1. S/P carpal tunnel release     Plan: Ms. Camero is 2 weeks status post right carpal tunnel release.  She reports some soreness but overall doing well.  Sleeping better.  No complaints.  Examination right hand shows healed surgical incision.  She can oppose the tip of the thumb to the fifth metacarpal head.  No neurovascular compromise.  Sutures removed and Steri-Strips applied.  Advance activity as tolerated.  Instructions reviewed.  Follow-up as needed.  Follow-Up Instructions: No follow-ups on file.   Orders:  No orders of the defined types were placed in this encounter.  No orders of the defined types were placed in this encounter.   Imaging: No results found.  PMFS History: Patient Active Problem List   Diagnosis Date Noted   Obstructive lung disease ([place]) 07/09/2022   Rash 04/20/2022   Right upper quadrant pain 01/21/2022   Wheezing 12/13/2020   Low back pain 09/22/2020   Yeast infection involving the vagina and surrounding area 06/10/2020   Complex cyst of right ovary 04/09/2020   Atherosclerosis of artery of extremity with rest pain ([place]) 03/15/2020   Ovarian cyst 03/09/2020   Postmenopausal bleeding 02/07/2020   Abnormal ankle brachial index (ABI) 01/14/2020   Abnormal facial hair 11/24/2019   Stress incontinence of urine 10/03/2019   Type 2 DM with CKD stage 3 and hypertension ([place]) 07/03/2019   Class 3 severe obesity with body mass index (BMI) of 40.0 to 44.9 in adult ([place]) 06/16/2019   CKD (chronic kidney disease) stage 3, GFR 30-59 ml/min ([place]) 06/16/2019   Healthcare maintenance 06/16/2019   Carpal tunnel syndrome on right  06/16/2019   Hypertension 08/04/2018   Tobacco dependence 08/04/2018   Past Medical History:  Diagnosis Date   Arthritis    Diabetes mellitus without complication ([place])    GERD (gastroesophageal reflux disease)    Hyperlipidemia    Hypertension     Family History  Problem Relation Age of Onset   Diabetes Mother    Hypertension Mother    Diabetes Father    Hypertension Father    Cancer Father    Diabetes Sister    Diabetes Sister    Diabetes Brother    Hypertension Brother    Colon cancer Brother 64   Diabetes Brother    Diabetes Daughter    Hypertension Daughter    Diabetes Son    Cancer Paternal Uncle    Diabetes Maternal Grandmother    Hypertension Maternal Grandmother    Colon polyps Neg Hx    Esophageal cancer Neg Hx    Rectal cancer Neg Hx    Stomach cancer Neg Hx     Past Surgical History:  Procedure Laterality Date   ABDOMINAL AORTOGRAM W/LOWER EXTREMITY N/A 03/15/2020   Procedure: ABDOMINAL AORTOGRAM W/LOWER EXTREMITY;  Surgeon: Angelia Mould, MD;  Location: [facility];  Service: Cardiovascular;  Laterality: N/A;   ANKLE SURGERY Right    CARPAL TUNNEL RELEASE Right 11/18/2022   Procedure: RIGHT CARPAL TUNNEL RELEASE;  Surgeon: Leandrew Koyanagi, MD;  Location: [facility];  Service: Orthopedics;  Laterality: Right;   INDUCED ABORTION     LYMPHADENECTOMY     Social History   Occupational History   Occupation: CNA    Comment: Retired  Tobacco Use   Smoking status: Every Day    Packs/day: .5    Types: Cigarettes    Passive exposure: Current   Smokeless tobacco: Never  Vaping Use   Vaping Use: Never used  Substance and Sexual Activity   Alcohol use: Yes    Alcohol/week: 5.0 standard drinks of alcohol    Types: 5 Cans of beer per week    Comment: 5 drinks/week    Drug use: Yes    Types: Marijuana    Comment: last smoked cig today, last marijuana 11/16/2022   Sexual activity: Not on file

## 2022-12-17 ENCOUNTER — Telehealth: Payer: Self-pay | Admitting: Student

## 2022-12-17 ENCOUNTER — Other Ambulatory Visit: Payer: Self-pay | Admitting: Student

## 2022-12-17 NOTE — Telephone Encounter (Signed)
[place] to schedule their annual wellness visit. Appointment made for 12/24/2022.  Thank you,  [place] Direct dial  [phone]

## 2022-12-17 NOTE — Telephone Encounter (Signed)
Called patient to schedule Medicare Annual Wellness Visit (AWV). Left message for patient to call back and schedule Medicare Annual Wellness Visit (AWV).  Last date of AWV: 11/04/2021   Please schedule an AWVS appointment at any time with [facility] VISIT.  If any questions, please contact me at [phone].   Thank you,  [place] Direct dial  [phone]

## 2022-12-18 ENCOUNTER — Other Ambulatory Visit: Payer: Self-pay | Admitting: Student

## 2022-12-18 DIAGNOSIS — I10 Essential (primary) hypertension: Secondary | ICD-10-CM

## 2022-12-23 NOTE — Patient Instructions (Signed)

## 2022-12-23 NOTE — Progress Notes (Unsigned)
I connected with  Alexandra Campos on 12/24/2022 by a audio enabled telemedicine application and verified that I am speaking with the correct person using two identifiers.  Patient Location: Home  Provider Location: Home Office  I discussed the limitations of evaluation and management by telemedicine. The patient expressed understanding and agreed to proceed.   Subjective:   Alexandra Campos is a 67 y.o. female who presents for Medicare Annual (Subsequent) preventive examination.  Review of Systems    Per HPI unless specifically indicated below.  Cardiac Risk Factors include: advanced age (>75men, >90 women);female gender, Hypertension, and Type 2 DM with CKD stage 3 and hypertension.           Objective:    Today's Vitals   12/24/22 1012  PainSc: 7    There is no height or weight on file to calculate BMI.     12/24/2022   10:22 AM 11/11/2022    8:58 AM 07/23/2022    9:48 AM 04/20/2022    2:06 PM 01/20/2022    2:34 PM 01/20/2022    1:57 PM 11/26/2021   10:15 AM  Advanced Directives  Does Patient Have a Medical Advance Directive? No No No No No No No  Would patient like information on creating a medical advance directive? No - Patient declined No - Patient declined No - Patient declined No - Patient declined No - Patient declined  No - Patient declined    Current Medications (verified) Outpatient Encounter Medications as of 12/24/2022  Medication Sig   Acetaminophen (TYLENOL PO) Take by mouth.   albuterol (VENTOLIN HFA) 108 (90 Base) MCG/ACT inhaler Inhale 2 puffs into the lungs every 4 (four) hours as needed for wheezing or shortness of breath.   atorvastatin (LIPITOR) 40 MG tablet Take 1 tablet by mouth once daily   benzocaine (ORAJEL) 10 % mucosal gel Use as directed 1 application in the mouth or throat as needed for mouth pain.   blood glucose meter kit and supplies KIT 1 each by Does not apply route daily. Dispense based on patient and insurance preference. Use up to four  times daily as directed. (FOR ICD-9 250.00, 250.01).   cetirizine (ZYRTEC) 10 MG tablet Take 1 tablet (10 mg total) by mouth daily.   chlorthalidone (HYGROTON) 25 MG tablet Take 1 tablet (25 mg total) by mouth daily.   diclofenac Sodium (VOLTAREN) 1 % GEL APPLY 4 GRAMS  TOPICALLY 4 TIMES DAILY FOR 14 DAYS   empagliflozin (JARDIANCE) 10 MG TABS tablet Take 1 tablet (10 mg total) by mouth daily.   fluticasone-salmeterol (WIXELA INHUB) 250-50 MCG/ACT AEPB Inhale 1 puff into the lungs in the morning and at bedtime.   glucose blood (ACCU-CHEK GUIDE) test strip USE 1 STRIP TWICE DAILY FOR BLOOD SUGAR. E11.9   Lancets Thin MISC 1 each by Does not apply route as needed.   losartan (COZAAR) 50 MG tablet TAKE 1 TABLET BY MOUTH ONCE DAILY . APPOINTMENT REQUIRED FOR FUTURE REFILLS   metFORMIN (GLUCOPHAGE-XR) 500 MG 24 hr tablet Take 2 tablets (1,000 mg total) by mouth in the morning and at bedtime.   Multiple Vitamin (MULTIVITAMIN PO) Take by mouth.   nystatin-triamcinolone ointment (MYCOLOG) Apply 1 Application topically 2 (two) times daily.   omeprazole (PRILOSEC) 20 MG capsule Take 1 capsule by mouth once daily   OZEMPIC, 0.25 OR 0.5 MG/DOSE, 2 MG/3ML SOPN INJECT 0.5 MG INTO THE SKIN ONCE A WEEK   Tiotropium Bromide Monohydrate (SPIRIVA RESPIMAT) 2.5  MCG/ACT AERS Inhale 2 puffs into the lungs daily.   Turmeric (QC TUMERIC COMPLEX PO) Take by mouth.   buPROPion (WELLBUTRIN SR) 150 MG 12 hr tablet Take 1 tablet (150 mg total) by mouth 2 (two) times daily. (Patient not taking: Reported on 12/24/2022)   diclofenac Sodium (VOLTAREN) 1 % GEL Apply 2 g topically 4 (four) times daily. (Patient not taking: Reported on 12/24/2022)   [DISCONTINUED] HYDROcodone-acetaminophen (NORCO) 5-325 MG tablet Take 0.5-1 tablets by mouth every 6 (six) hours as needed. (Patient not taking: Reported on 12/24/2022)   No facility-administered encounter medications on file as of 12/24/2022.    Allergies (verified) Patient has no  known allergies.   History: Past Medical History:  Diagnosis Date   Arthritis    Diabetes mellitus without complication    GERD (gastroesophageal reflux disease)    Hyperlipidemia    Hypertension    Past Surgical History:  Procedure Laterality Date   ABDOMINAL AORTOGRAM W/LOWER EXTREMITY N/A 03/15/2020   Procedure: ABDOMINAL AORTOGRAM W/LOWER EXTREMITY;  Surgeon: Chuck Hintickson, Christopher S, MD;  Location: [facility];  Service: Cardiovascular;  Laterality: N/A;   ANKLE SURGERY Right    CARPAL TUNNEL RELEASE Right 11/18/2022   Procedure: RIGHT CARPAL TUNNEL RELEASE;  Surgeon: Tarry KosXu, Naiping M, MD;  Location: [facility];  Service: Orthopedics;  Laterality: Right;   INDUCED ABORTION     LYMPHADENECTOMY     Family History  Problem Relation Age of Onset   Diabetes Mother    Hypertension Mother    Diabetes Father    Hypertension Father    Cancer Father    Diabetes Sister    Diabetes Sister    Diabetes Brother    Hypertension Brother    Colon cancer Brother 50   Diabetes Brother    Diabetes Daughter    Hypertension Daughter    Diabetes Son    Cancer Paternal Uncle    Diabetes Maternal Grandmother    Hypertension Maternal Grandmother    Colon polyps Neg Hx    Esophageal cancer Neg Hx    Rectal cancer Neg Hx    Stomach cancer Neg Hx    Social History   Socioeconomic History   Marital status: Single    Spouse name: Not on file   Number of children: 1   Years of education: 12   Highest education level: 12th grade  Occupational History   Occupation: CNA    Comment: Retired  Tobacco Use   Smoking status: Every Day    Packs/day: .5    Types: Cigarettes    Passive exposure: Current   Smokeless tobacco: Never  Vaping Use   Vaping Use: Never used  Substance and Sexual Activity   Alcohol use: Yes    Alcohol/week: 5.0 standard drinks of alcohol    Types: 5 Cans of beer per week    Comment: 5 drinks/week    Drug use: Yes    Types: Marijuana    Comment:  last smoked cig today, last marijuana 11/16/2022   Sexual activity: Not on file  Other Topics Concern   Not on file  Social History Narrative   Patient lives alone in an independent living facility.    Patient walks the parameter daily ~10 minutes.    Patient enjoys seeing her neighbors.    Patient has one son and one daughter who passed away.    Social Determinants of Health   Financial Resource Strain: Low Risk  (12/24/2022)   Overall Financial Resource  Strain (CARDIA)    Difficulty of Paying Living Expenses: Not hard at all  Food Insecurity: No Food Insecurity (12/24/2022)   Hunger Vital Sign    Worried About Running Out of Food in the Last Year: Never true    Ran Out of Food in the Last Year: Never true  Transportation Needs: No Transportation Needs (12/24/2022)   PRAPARE - Administrator, Civil Service (Medical): No    Lack of Transportation (Non-Medical): No  Physical Activity: Sufficiently Active (12/24/2022)   Exercise Vital Sign    Days of Exercise per Week: 7 days    Minutes of Exercise per Session: 30 min  Stress: Stress Concern Present (12/24/2022)   [employer] of Occupational Health - Occupational Stress Questionnaire    Feeling of Stress : To some extent  Social Connections: Moderately Integrated (12/24/2022)   Social Connection and Isolation Panel [NHANES]    Frequency of Communication with Friends and Family: More than three times a week    Frequency of Social Gatherings with Friends and Family: Three times a week    Attends Religious Services: 1 to 4 times per year    Active Member of Clubs or Organizations: Yes    Attends Banker Meetings: 1 to 4 times per year    Marital Status: Never married    Tobacco Counseling Ready to quit: Not Answered Counseling given: No   Clinical Intake:  Pre-visit preparation completed: No  Pain : 0-10 Pain Score: 7  Pain Type: Chronic pain Pain Location: Leg Pain Orientation: Right, Left Pain  Descriptors / Indicators: Aching Pain Frequency: Constant     Nutritional Status: BMI 25 -29 Overweight Nutritional Risks: None Diabetes: Yes CBG done?: No Did pt. bring in CBG monitor from home?: No  How often do you need to have someone help you when you read instructions, pamphlets, or other written materials from your doctor or pharmacy?: 1 - Never  Diabetic?Nutrition Risk Assessment:  Has the patient had any N/V/D within the last 2 months?  No  Does the patient have any non-healing wounds?  No  Has the patient had any unintentional weight loss or weight gain?  No   Diabetes:  Is the patient diabetic?  Yes  If diabetic, was a CBG obtained today?  No  Did the patient bring in their glucometer from home?  No  How often do you monitor your CBG's? Once weekly .   Financial Strains and Diabetes Management:  Are you having any financial strains with the device, your supplies or your medication? No .  Does the patient want to be seen by Chronic Care Management for management of their diabetes?  No  Would the patient like to be referred to a Nutritionist or for Diabetic Management?  No   Diabetic Exams:  Diabetic Eye Exam: Completed 01/20/2022 Diabetic Foot Exam: Overdue, Pt has been advised about the importance in completing this exam. Pt is scheduled for diabetic foot exam on at next diabetic appointment.   Interpreter Needed?: No  Information entered by :: Koren Shiver, CMA   Activities of Daily Living    12/24/2022   10:10 AM 11/18/2022   10:08 AM  In your present state of health, do you have any difficulty performing the following activities:  Hearing? 0 0  Vision? 1 1  Comment Dr. Nile Riggs   Difficulty concentrating or making decisions? 1 0  Walking or climbing stairs? 1 0  Dressing or bathing? 0 0  Doing errands,  shopping? 1     Patient Care Team: Darral Dash, DO as PCP - General (Family Medicine)  Indicate any recent Medical Services you may have  received from other than [facility] providers in the past year (date may be approximate).     Assessment:   This is a routine wellness examination for Eddye.   Hearing/Vision screen Denies any hearing issues. Denies any change to her vision. Annual Eye Exam. Dr. Nile Riggs Dietary issues and exercise activities discussed: Current Exercise Habits: Structured exercise class, Type of exercise: walking, Time (Minutes): 30, Frequency (Times/Week): 7, Weekly Exercise (Minutes/Week): 210, Intensity: Moderate, Exercise limited by: orthopedic condition(s)   Goals Addressed   None    Depression Screen    12/24/2022   10:09 AM 07/23/2022    9:47 AM 07/09/2022    1:39 PM 04/20/2022    2:06 PM 01/20/2022    1:57 PM 11/26/2021   10:19 AM 11/04/2021   10:45 AM  PHQ 2/9 Scores  PHQ - 2 Score 2 0 0 0 0 0 0  PHQ- 9 Score  5 5 3 3 4 2     Fall Risk    12/24/2022   10:10 AM 11/04/2021   10:53 AM 09/19/2020    1:35 PM 06/10/2020    8:34 AM 01/30/2020    4:04 PM  Fall Risk   Falls in the past year? 0 1 1 1  0  Number falls in past yr: 0 1 1 0 0  Injury with Fall? 0 1 1 0 0  Risk for fall due to : No Fall Risks Impaired balance/gait;Orthopedic patient     Follow up Falls evaluation completed Falls prevention discussed  Falls evaluation completed     FALL RISK PREVENTION PERTAINING TO THE HOME:  Any stairs in or around the home? No  If so, are there any without handrails? No  Home free of loose throw rugs in walkways, pet beds, electrical cords, etc? Yes  Adequate lighting in your home to reduce risk of falls? Yes   ASSISTIVE DEVICES UTILIZED TO PREVENT FALLS:  Life alert? No  Use of a cane, walker or w/c? Yes  Grab bars in the bathroom? Yes  Shower chair or bench in shower? No  Elevated toilet seat or a handicapped toilet? No   TIMED UP AND GO:  Was the test performed? Unable to perform, virtual appointment   Cognitive Function:        12/24/2022   10:13 AM 11/04/2021   10:56 AM  6CIT Screen   What Year? 0 points 0 points  What month? 0 points 0 points  What time? 0 points 0 points  Count back from 20 0 points 0 points  Months in reverse 0 points 0 points  Repeat phrase 0 points 0 points  Total Score 0 points 0 points    Immunizations Immunization History  Administered Date(s) Administered   COVID-19, mRNA, vaccine(Comirnaty)12 years and older 07/23/2022   Influenza Inj Mdck Quad Pf 07/07/2018   Influenza,inj,Quad PF,6+ Mos 06/14/2019, 07/03/2020, 05/26/2021, 07/17/2022   PFIZER Comirnaty(Gray Top)Covid-19 Tri-Sucrose Vaccine 02/26/2021, 07/01/2021   PFIZER(Purple Top)SARS-COV-2 Vaccination 11/23/2019, 12/14/2019, 07/08/2020   PNEUMOCOCCAL CONJUGATE-20 07/21/2022   PPD Test 05/06/2020, 05/05/2021   Pneumococcal Conjugate-13 07/03/2019   Pneumococcal Polysaccharide-23 07/08/2020   Tdap 07/03/2019    TDAP status: Up to date  Flu Vaccine status: Up to date  Pneumococcal vaccine status: Up to date  Covid-19 vaccine status: Completed vaccines  Qualifies for Shingles Vaccine? Yes   Zostavax completed  No   Shingrix Completed?: No.    Education has been provided regarding the importance of this vaccine. Patient has been advised to call insurance company to determine out of pocket expense if they have not yet received this vaccine. Advised may also receive vaccine at local pharmacy or Health Dept. Verbalized acceptance and understanding.  Screening Tests Health Maintenance  Topic Date Due   Zoster Vaccines- Shingrix (1 of 2) Never done   Diabetic kidney evaluation - Urine ACR  07/29/2019   FOOT EXAM  05/20/2022   DEXA SCAN  Never done   PAP SMEAR-Modifier  11/08/2022   HEMOGLOBIN A1C  01/08/2023   OPHTHALMOLOGY EXAM  01/21/2023   INFLUENZA VACCINE  04/15/2023   Diabetic kidney evaluation - eGFR measurement  11/17/2023   MAMMOGRAM  12/24/2023   Medicare Annual Wellness (AWV)  12/24/2023   COLONOSCOPY (Pts 45-23yrs Insurance coverage will need to be confirmed)   12/31/2025   DTaP/Tdap/Td (2 - Td or Tdap) 07/02/2029   Pneumonia Vaccine 38+ Years old  Completed   COVID-19 Vaccine  Completed   Hepatitis C Screening  Completed   HIV Screening  Completed   HPV VACCINES  Aged Out    Health Maintenance  Health Maintenance Due  Topic Date Due   Zoster Vaccines- Shingrix (1 of 2) Never done   Diabetic kidney evaluation - Urine ACR  07/29/2019   FOOT EXAM  05/20/2022   DEXA SCAN  Never done   PAP SMEAR-Modifier  11/08/2022    Colorectal cancer screening: Type of screening: Colonoscopy. Completed 12/31/2020. Repeat every 5 years  Mammogram status: Completed 12/23/2021. Repeat every year  DEXA Scan: Overdue , order placed today 12/24/2022.  Lung Cancer Screening: (Low Dose CT Chest recommended if Age 65-80 years, 30 pack-year currently smoking OR have quit w/in 15years.) does not qualify.   Lung Cancer Screening Referral:                                                                                                                                                                                                                           Additional Screening:  Hepatitis C Screening: does qualify; Completed 06/14/2019  Vision Screening: Recommended annual ophthalmology exams for early detection of glaucoma and other disorders of the eye. Is the patient up to date with their annual eye exam?  Yes  Who is the provider or what is the name of the office in which the patient attends annual eye exams?  Dr. Nile Riggs If pt is not established with a provider, would they like to be referred to a provider to establish care? No .   Dental Screening: Recommended annual dental exams for proper oral hygiene  Community Resource Referral / Chronic Care Management: CRR required this visit?  No   CCM required this visit?  No      Plan:     I have personally reviewed and noted the following in the patient's chart:   Medical and social history Use of alcohol,  tobacco or illicit drugs  Current medications and supplements including opioid prescriptions. Patient is currently taking opioid prescriptions. Information provided to patient regarding non-opioid alternatives. Patient advised to discuss non-opioid treatment plan with their provider. Functional ability and status Nutritional status Physical activity Advanced directives List of other physicians Hospitalizations, surgeries, and ER visits in previous 12 months Vitals Screenings to include cognitive, depression, and falls Referrals and appointments  In addition, I have reviewed and discussed with patient certain preventive protocols, quality metrics, and best practice recommendations. A written personalized care plan for preventive services as well as general preventive health recommendations were provided to patient.     Ms. Branum , Thank you for taking time to come for your Medicare Wellness Visit. I appreciate your ongoing commitment to your health goals. Please review the following plan we discussed and let me know if I can assist you in the future.   These are the goals we discussed:  Goals      Quit Smoking     Current 1/2 PPD.        This is a list of the screening recommended for you and due dates:  Health Maintenance  Topic Date Due   Zoster (Shingles) Vaccine (1 of 2) Never done   Yearly kidney health urinalysis for diabetes  07/29/2019   Complete foot exam   05/20/2022   DEXA scan (bone density measurement)  Never done   Pap Smear  11/08/2022   Hemoglobin A1C  01/08/2023   Eye exam for diabetics  01/21/2023   Flu Shot  04/15/2023   Yearly kidney function blood test for diabetes  11/17/2023   Mammogram  12/24/2023   Medicare Annual Wellness Visit  12/24/2023   Colon Cancer Screening  12/31/2025   DTaP/Tdap/Td vaccine (2 - Td or Tdap) 07/02/2029   Pneumonia Vaccine  Completed   COVID-19 Vaccine  Completed   Hepatitis C Screening: USPSTF Recommendation to screen -  Ages 45-79 yo.  Completed   HIV Screening  Completed   HPV Vaccine  Aged [address] [place]   12/24/2022  Nurse Notes: Approximately 30 minute Non-Face -To-Face Medicare Wellness Visit

## 2022-12-24 ENCOUNTER — Ambulatory Visit (INDEPENDENT_AMBULATORY_CARE_PROVIDER_SITE_OTHER): Payer: Medicare HMO

## 2022-12-24 DIAGNOSIS — Z0000 Encounter for general adult medical examination without abnormal findings: Secondary | ICD-10-CM

## 2022-12-24 DIAGNOSIS — E2839 Other primary ovarian failure: Secondary | ICD-10-CM | POA: Diagnosis not present

## 2022-12-28 ENCOUNTER — Other Ambulatory Visit: Payer: Self-pay | Admitting: Student

## 2022-12-28 DIAGNOSIS — Z1231 Encounter for screening mammogram for malignant neoplasm of breast: Secondary | ICD-10-CM

## 2023-01-05 ENCOUNTER — Other Ambulatory Visit: Payer: Self-pay | Admitting: Family Medicine

## 2023-01-05 DIAGNOSIS — E2839 Other primary ovarian failure: Secondary | ICD-10-CM

## 2023-01-12 ENCOUNTER — Other Ambulatory Visit: Payer: Self-pay | Admitting: Student

## 2023-01-12 DIAGNOSIS — Z8719 Personal history of other diseases of the digestive system: Secondary | ICD-10-CM

## 2023-01-12 DIAGNOSIS — I10 Essential (primary) hypertension: Secondary | ICD-10-CM

## 2023-01-13 ENCOUNTER — Other Ambulatory Visit: Payer: Self-pay | Admitting: Student

## 2023-01-13 DIAGNOSIS — E1159 Type 2 diabetes mellitus with other circulatory complications: Secondary | ICD-10-CM

## 2023-01-13 DIAGNOSIS — I10 Essential (primary) hypertension: Secondary | ICD-10-CM

## 2023-01-13 MED ORDER — CHLORTHALIDONE 25 MG PO TABS: 25.0000 mg | ORAL_TABLET | Freq: Every day | ORAL | 3 refills | Status: DC

## 2023-01-21 DIAGNOSIS — H25813 Combined forms of age-related cataract, bilateral: Secondary | ICD-10-CM | POA: Diagnosis not present

## 2023-01-21 DIAGNOSIS — Z7984 Long term (current) use of oral hypoglycemic drugs: Secondary | ICD-10-CM | POA: Diagnosis not present

## 2023-01-21 DIAGNOSIS — H5213 Myopia, bilateral: Secondary | ICD-10-CM | POA: Diagnosis not present

## 2023-01-21 DIAGNOSIS — H52203 Unspecified astigmatism, bilateral: Secondary | ICD-10-CM | POA: Diagnosis not present

## 2023-01-21 DIAGNOSIS — Z7985 Long-term (current) use of injectable non-insulin antidiabetic drugs: Secondary | ICD-10-CM | POA: Diagnosis not present

## 2023-01-21 DIAGNOSIS — E1136 Type 2 diabetes mellitus with diabetic cataract: Secondary | ICD-10-CM | POA: Diagnosis not present

## 2023-01-21 DIAGNOSIS — H524 Presbyopia: Secondary | ICD-10-CM | POA: Diagnosis not present

## 2023-01-21 LAB — HM DIABETES EYE EXAM

## 2023-02-02 ENCOUNTER — Ambulatory Visit: Payer: Medicare HMO | Admitting: Obstetrics and Gynecology

## 2023-02-04 ENCOUNTER — Ambulatory Visit: Payer: Medicare HMO

## 2023-02-16 ENCOUNTER — Telehealth: Payer: Self-pay | Admitting: Orthopaedic Surgery

## 2023-02-16 NOTE — Telephone Encounter (Signed)
Patient called asked if she can get the gel injection again? The number to contact patient is [phone]

## 2023-02-16 NOTE — Telephone Encounter (Signed)
Tried to call patient. No answer and mailbox is full. Patient must wait 6months before getting approval. Last given in Feb, so much wait until August.

## 2023-02-16 NOTE — Telephone Encounter (Signed)
Given 10/27/2022

## 2023-02-18 ENCOUNTER — Telehealth: Payer: Self-pay

## 2023-02-18 NOTE — Telephone Encounter (Signed)
Patient calls nurse line requesting new referral for OBGYN and to reschedule appointment with PCP.   She is requesting the soonest possible appointment. Scheduled with PCP on Monday, 6/10. Advised that last referral to GYN was placed in 2022, therefore, new referral will be needed. She is requesting that referral be sent to a different provider. She does not want to go back to the [facility].   She will discuss at visit with PCP on 02/22/23.  Veronda Prude, RN

## 2023-02-19 ENCOUNTER — Ambulatory Visit: Payer: Medicare HMO

## 2023-02-22 ENCOUNTER — Ambulatory Visit (INDEPENDENT_AMBULATORY_CARE_PROVIDER_SITE_OTHER): Payer: Medicare HMO | Admitting: Student

## 2023-02-22 ENCOUNTER — Telehealth: Payer: Self-pay

## 2023-02-22 ENCOUNTER — Encounter: Payer: Self-pay | Admitting: Student

## 2023-02-22 VITALS — BP 156/79 | Ht 60.0 in | Wt 202.0 lb

## 2023-02-22 DIAGNOSIS — I10 Essential (primary) hypertension: Secondary | ICD-10-CM | POA: Diagnosis not present

## 2023-02-22 DIAGNOSIS — E1122 Type 2 diabetes mellitus with diabetic chronic kidney disease: Secondary | ICD-10-CM | POA: Diagnosis not present

## 2023-02-22 DIAGNOSIS — N183 Chronic kidney disease, stage 3 unspecified: Secondary | ICD-10-CM | POA: Diagnosis not present

## 2023-02-22 DIAGNOSIS — N950 Postmenopausal bleeding: Secondary | ICD-10-CM | POA: Diagnosis not present

## 2023-02-22 DIAGNOSIS — I129 Hypertensive chronic kidney disease with stage 1 through stage 4 chronic kidney disease, or unspecified chronic kidney disease: Secondary | ICD-10-CM | POA: Diagnosis not present

## 2023-02-22 MED ORDER — LOSARTAN POTASSIUM 100 MG PO TABS: 100.0000 mg | ORAL_TABLET | Freq: Every day | ORAL | 3 refills | Status: DC

## 2023-02-22 NOTE — Progress Notes (Signed)
  SUBJECTIVE:   CHIEF COMPLAINT / HPI:   Alexandra Campos is a 66 year-old female here for follow-up of T2DM, HTN and to discuss vaginal bleeding. Has had a lot going on with brother passing away, 27 year-old mother diagnosed with stage 4 lung cancer recently.  T2DM Last A1c 7.9% Empagliflozin 10 mg, Metformin 1000 mg daily, Ozempic 0.25 mg weekly Eye exam UTD; went  Foot exam UTD Still smoking cigarettes  HTN Losartan 50 mg daily, Chlorthalidone 25 mg daily Took meds today Checks BP at home- which she gets 140s/80s  Post-menopausal bleeding Reporting vaginal bleeding last week for once every 3 weeks  Last night was first time she saw bright red blood Not associated with bowel movements Reports a history of multiple ovarian cysts  PERTINENT  PMH / PSH:   Past Medical History:  Diagnosis Date   Arthritis    Diabetes mellitus without complication (HCC)    GERD (gastroesophageal reflux disease)    Hyperlipidemia    Hypertension     Patient Care Team: Darral Dash, DO as PCP - General (Family Medicine) OBJECTIVE:  BP (!) 156/79   Ht 5' (1.524 m)   Wt 202 lb (91.6 kg)   BMI 39.45 kg/m  Physical Exam Constitutional:      General: She is not in acute distress. Eyes:     Extraocular Movements: Extraocular movements intact.     Pupils: Pupils are equal, round, and reactive to light.  Cardiovascular:     Rate and Rhythm: Normal rate and regular rhythm.  Pulmonary:     Effort: Pulmonary effort is normal.     Breath sounds: Normal breath sounds.  Musculoskeletal:        General: No swelling.  Neurological:     Mental Status: She is alert.      ASSESSMENT/PLAN:  Type 2 DM with CKD stage 3 and hypertension (HCC) -     Basic metabolic panel -     Hemoglobin A1c -     CBC -     Ambulatory referral to Podiatry  Postmenopausal bleeding Assessment & Plan: Has previously had endometrial biopsy that was negative, but given new onset recurrence of postmenopausal bleeding will  obtain transvaginal ultrasound on Wednesday, June 12. -Also obtain CBC -Will follow-up on the results   Orders: -     US PELVIC COMPLETE WITH TRANSVAGINAL; Future  Primary hypertension Assessment & Plan: BP elevated x 2, above goal -Increase losartan to 100 mg daily. -Continue chlorthalidone 25 mg daily -BMP today -Continue check blood pressure at home and follow-up in 2 to 4 weeks   Other orders -     Losartan Potassium; Take 1 tablet (100 mg total) by mouth at bedtime.  Dispense: 90 tablet; Refill: 3   No follow-ups on file. Darral Dash, DO 02/22/2023, 2:01 PM PGY-2, [facility]

## 2023-02-22 NOTE — Assessment & Plan Note (Signed)
BP elevated x 2, above goal -Increase losartan to 100 mg daily. -Continue chlorthalidone 25 mg daily -BMP today -Continue check blood pressure at home and follow-up in 2 to 4 weeks

## 2023-02-22 NOTE — Patient Instructions (Addendum)
It was great seeing you today.  As we discussed, - INCREASE your Losartan to 100 mg daily. You may use your 50 mg tablets (take 2 daily) until you run out of your supply. Then, start 100 mg once daily. - INCREASE your Ozempic to 0.5 mg weekly. -  Please go to get your pelvic ultrasound on Wednesday June 12th at 2:30 PM at [facility] at Entrance A. Please arrive with a full bladder  We collected testing today- I will call you if it is abnormal. If your results are normal, I will send you a MyChart message.   If you have any questions or concerns, please feel free to call the clinic.   Have a wonderful day,  Dr. Darral Dash [facility] [phone]

## 2023-02-22 NOTE — Assessment & Plan Note (Addendum)
Has previously had endometrial biopsy that was negative, but given new onset recurrence of postmenopausal bleeding will obtain transvaginal ultrasound on Wednesday, June 12. -Also obtain CBC -Will follow-up on the results

## 2023-02-23 LAB — BASIC METABOLIC PANEL
BUN/Creatinine Ratio: 23 (ref 12–28)
BUN: 25 mg/dL (ref 8–27)
CO2: 24 mmol/L (ref 20–29)
Calcium: 9.1 mg/dL (ref 8.7–10.3)
Chloride: 101 mmol/L (ref 96–106)
Creatinine, Ser: 1.08 mg/dL — ABNORMAL HIGH (ref 0.57–1.00)
Glucose: 216 mg/dL — ABNORMAL HIGH (ref 70–99)
Potassium: 3.4 mmol/L — ABNORMAL LOW (ref 3.5–5.2)
Sodium: 141 mmol/L (ref 134–144)
eGFR: 57 mL/min/{1.73_m2} — ABNORMAL LOW (ref >59)

## 2023-02-23 LAB — CBC
Hematocrit: 32.7 % — ABNORMAL LOW (ref 34.0–46.6)
Hemoglobin: 10.5 g/dL — ABNORMAL LOW (ref 11.1–15.9)
MCH: 27.4 pg (ref 26.6–33.0)
MCHC: 32.1 g/dL (ref 31.5–35.7)
MCV: 85 fL (ref 79–97)
Platelets: 255 10*3/uL (ref 150–450)
RBC: 3.83 x10E6/uL (ref 3.77–5.28)
RDW: 15.2 % (ref 11.7–15.4)
WBC: 5.3 10*3/uL (ref 3.4–10.8)

## 2023-02-23 LAB — HEMOGLOBIN A1C
Est. average glucose Bld gHb Est-mCnc: 203 mg/dL
Hgb A1c MFr Bld: 8.7 % — ABNORMAL HIGH (ref 4.8–5.6)

## 2023-02-24 ENCOUNTER — Ambulatory Visit (HOSPITAL_COMMUNITY)
Admission: RE | Admit: 2023-02-24 | Discharge: 2023-02-24 | Disposition: A | Discharge status: Home / Self Care | Payer: Medicare HMO | Source: Ambulatory Visit | Attending: Family Medicine | Admitting: Family Medicine

## 2023-02-24 DIAGNOSIS — N950 Postmenopausal bleeding: Secondary | ICD-10-CM | POA: Insufficient documentation

## 2023-02-24 DIAGNOSIS — N83291 Other ovarian cyst, right side: Secondary | ICD-10-CM | POA: Diagnosis not present

## 2023-02-24 DIAGNOSIS — D259 Leiomyoma of uterus, unspecified: Secondary | ICD-10-CM | POA: Diagnosis not present

## 2023-03-01 ENCOUNTER — Ambulatory Visit: Payer: Medicare HMO | Admitting: Student

## 2023-03-02 ENCOUNTER — Telehealth: Payer: Self-pay

## 2023-03-02 NOTE — Telephone Encounter (Signed)
Patient calls nurse line regarding results from recent appointment. She is requesting returned call at [phone].  Veronda Prude, RN

## 2023-03-03 NOTE — Telephone Encounter (Signed)
Tried to call patient x2 to discuss lab results and pelvic US with no answer. Voicemail box full.  Will try again to call patient tomorrow.

## 2023-03-04 ENCOUNTER — Encounter (HOSPITAL_BASED_OUTPATIENT_CLINIC_OR_DEPARTMENT_OTHER): Payer: Self-pay | Admitting: Student

## 2023-03-04 ENCOUNTER — Other Ambulatory Visit: Payer: Self-pay | Admitting: Student

## 2023-03-04 DIAGNOSIS — N83201 Unspecified ovarian cyst, right side: Secondary | ICD-10-CM

## 2023-03-04 MED ORDER — EMPAGLIFLOZIN 25 MG PO TABS: 25.0000 mg | ORAL_TABLET | Freq: Every day | ORAL | 3 refills | Status: DC

## 2023-03-16 ENCOUNTER — Other Ambulatory Visit: Payer: Self-pay | Admitting: Pulmonary Disease

## 2023-03-16 DIAGNOSIS — J432 Centrilobular emphysema: Secondary | ICD-10-CM

## 2023-03-17 ENCOUNTER — Ambulatory Visit
Admission: RE | Admit: 2023-03-17 | Discharge: 2023-03-17 | Disposition: A | Discharge status: Home / Self Care | Payer: Medicare HMO | Source: Ambulatory Visit

## 2023-03-17 DIAGNOSIS — Z1231 Encounter for screening mammogram for malignant neoplasm of breast: Secondary | ICD-10-CM | POA: Diagnosis not present

## 2023-03-22 ENCOUNTER — Telehealth: Payer: Self-pay

## 2023-03-22 NOTE — Telephone Encounter (Signed)
Patient calls nurse line in regards to GYN referral.   Referral was placed in June for Dr. Shawnie Pons.   Attempted to give patient their office information.   However, patient stated "it doesn't matter no one answers over there."  She reports PCP told her she would call and make her an apt.   Will forward to PCP.

## 2023-03-23 ENCOUNTER — Other Ambulatory Visit: Payer: Self-pay | Admitting: *Deleted

## 2023-03-23 DIAGNOSIS — I70229 Atherosclerosis of native arteries of extremities with rest pain, unspecified extremity: Secondary | ICD-10-CM

## 2023-03-24 ENCOUNTER — Telehealth: Payer: Self-pay

## 2023-03-24 NOTE — Telephone Encounter (Signed)
Patient calls nurse line reporting flu like symptoms.   She reports she went to a family gathering over the weekend and by Monday evening she wasn't feeling well. She reports fevers and chills. She reports tmax of 102.  She denies any SOB.  She reports she has been taking OTC medications that are alleviating symptoms.   Patient advised if she is able to take a covid test to call us with results. Otherwise she will need to make an apt.  Conservative measures given to patient.   Precautions discussed.

## 2023-03-25 ENCOUNTER — Telehealth: Payer: Self-pay | Admitting: Student

## 2023-03-25 MED ORDER — NIRMATRELVIR/RITONAVIR (PAXLOVID)TABLET: 3.0000 | ORAL_TABLET | Freq: Two times a day (BID) | ORAL | 0 refills | Status: DC

## 2023-03-25 NOTE — Telephone Encounter (Signed)
Patient returns call to nurse line regarding request for antivirals. Patient reports testing positive today.   Advised patient that we have sent message to provider regarding antivirals and that we will call her with update.   Veronda Prude, RN

## 2023-03-25 NOTE — Telephone Encounter (Signed)
See phone note- returned call to patient regarding COVID results. Paxlovid sent to pharmacy. Medication adjustments discussed. ER return precautions discussed.

## 2023-03-25 NOTE — Telephone Encounter (Signed)
Patient is calling back stating she does have Covid and is needing doctor to call her in something. Patient states she can not come in due to throwing up and using the restroom.  Please Advise.   Thanks!

## 2023-03-26 MED ORDER — NIRMATRELVIR/RITONAVIR (PAXLOVID)TABLET: 3.0000 | ORAL_TABLET | Freq: Two times a day (BID) | ORAL | 0 refills | Status: DC

## 2023-03-26 MED ORDER — NIRMATRELVIR/RITONAVIR (PAXLOVID)TABLET: 3.0000 | ORAL_TABLET | Freq: Two times a day (BID) | ORAL | 0 refills | Status: AC

## 2023-03-26 NOTE — Addendum Note (Signed)
Addended by: Steva Colder on: 03/26/2023 12:30 PM   Modules accepted: Orders

## 2023-03-26 NOTE — Telephone Encounter (Signed)
Medication was set to print.   Medication resent.

## 2023-03-29 ENCOUNTER — Telehealth: Payer: Self-pay

## 2023-03-29 DIAGNOSIS — R062 Wheezing: Secondary | ICD-10-CM

## 2023-03-29 NOTE — Telephone Encounter (Signed)
Patient calls nurse line requesting a DME order for a Rolator.   Patients last Rolator was ordered in 2022. She reports this broke and was told by company "all that is needed is a new prescription." Advised unsure if insurance will cover.   Will forward to PCP.

## 2023-03-30 ENCOUNTER — Ambulatory Visit: Payer: Medicare HMO | Admitting: Podiatry

## 2023-04-01 ENCOUNTER — Telehealth: Payer: Self-pay | Admitting: Orthopaedic Surgery

## 2023-04-01 NOTE — Telephone Encounter (Signed)
yes

## 2023-04-01 NOTE — Telephone Encounter (Signed)
Pt requesting gel injection in left knee please advise

## 2023-04-01 NOTE — Telephone Encounter (Signed)
VOB submitted for Monovisc, left knee 

## 2023-04-01 NOTE — Telephone Encounter (Signed)
Next gel injection appointment needs to be after 04/27/2023.

## 2023-04-04 NOTE — Addendum Note (Signed)
Addended by: Darral Dash on: 04/04/2023 08:34 PM   Modules accepted: Orders

## 2023-04-06 NOTE — Telephone Encounter (Signed)
Community message sent to [employer] for processing.

## 2023-04-06 NOTE — Telephone Encounter (Signed)
Called pt about another issue. Pt asked if an order had been placed for her Rolator. I told her I see an order so she should be hearing from someone soon. I also told her I would send this message to the person she spoke with originally and if she has any more information either she or I would give her a call. Sunday Spillers, CMA d

## 2023-04-06 NOTE — Telephone Encounter (Signed)
Spoke to [facility]. First available appt isn't until the September schedule comes out. They have pt number and will call her once the schedule comes out. I will also make a note and call them on 04/15/2023 to ask if the schedule has come out. I called the pt and let her know what is happening and gave her the phone number to call in August if she hasn't heard from them.  Sunday Spillers, CMA

## 2023-04-08 ENCOUNTER — Other Ambulatory Visit: Payer: Self-pay | Admitting: Student

## 2023-04-08 ENCOUNTER — Ambulatory Visit (HOSPITAL_COMMUNITY)
Admission: RE | Admit: 2023-04-08 | Discharge: 2023-04-08 | Disposition: A | Discharge status: Home / Self Care | Payer: Medicare HMO | Source: Ambulatory Visit | Attending: Vascular Surgery | Admitting: Vascular Surgery

## 2023-04-08 ENCOUNTER — Encounter: Payer: Self-pay | Admitting: Vascular Surgery

## 2023-04-08 ENCOUNTER — Ambulatory Visit (INDEPENDENT_AMBULATORY_CARE_PROVIDER_SITE_OTHER): Payer: Medicare HMO | Admitting: Vascular Surgery

## 2023-04-08 VITALS — BP 148/85 | HR 90 | Temp 97.9°F | Resp 20 | Ht 60.0 in | Wt 197.0 lb

## 2023-04-08 DIAGNOSIS — I70229 Atherosclerosis of native arteries of extremities with rest pain, unspecified extremity: Secondary | ICD-10-CM

## 2023-04-08 DIAGNOSIS — I10 Essential (primary) hypertension: Secondary | ICD-10-CM

## 2023-04-08 LAB — VAS US ABI WITH/WO TBI

## 2023-04-08 NOTE — Progress Notes (Signed)
REASON FOR VISIT:   Follow-up of peripheral arterial disease  MEDICAL ISSUES:   PERIPHERAL ARTERIAL DISEASE: This patient has stable claudication of both lower extremities.  She denies rest pain or nonhealing wounds.  We have again had a long discussion about the importance of tobacco cessation.  She is under a lot of stress because her mother who is terminally ill.  I have again encouraged her to stay as active as possible.  As her last arteriogram was in 2021 and this would need to be repeated if her symptoms progressed to the point of rest pain or nonhealing ulcer.  She would be at increased risk for infection because of her diabetes and obesity.  I have explained that I will be retiring.  I have ordered follow-up ABIs in 6 months and she is agreeable to be seen by the PAs at that time for continued follow-up.  If her symptoms progress she would need repeat arteriography and potentially staged bilateral  fem pop bypasses.  Based on her her arteriogram from 2021 she had some calcific plaque in both common femoral arteries more so on the right  She had reconstitution of her popliteal artery at the level of the knee.  She will call sooner if her symptoms progressed.   HPI:   Alexandra Campos is a pleasant 67 y.o. female who I last saw on 09/17/2022.  She has known multilevel arterial occlusive disease.  She has plaque in both common femoral arteries and bilateral superficial femoral artery occlusions.  She had stable claudication.  Given her obesity I was reluctant to consider revascularization and open surgery given the risk of infection.  Her activity was limited by her knee pain as she has significant osteoarthritis.  At that time we also discussed the importance of tobacco cessation.  She comes in for 65-month follow-up visit.  Since I saw her last, she continues to have claudication in both legs that involves her calves and hips.  She also has arthritis in both knees which is worse on the left  side.  She can only walk a short distance before experiencing symptoms.  She is not however having rest pain.  She does not have any wounds.  She is trying to cut back on the cigarettes and is down to 8 cigarettes a day.  However she is under a lot of stress because her mother has been ill and has dementia and cancer.  Past Medical History:  Diagnosis Date   Arthritis    Diabetes mellitus without complication (HCC)    GERD (gastroesophageal reflux disease)    Hyperlipidemia    Hypertension     Family History  Problem Relation Age of Onset   Diabetes Mother    Hypertension Mother    Diabetes Father    Hypertension Father    Cancer Father    Diabetes Sister    Diabetes Sister    Diabetes Brother    Hypertension Brother    Colon cancer Brother 50   Diabetes Brother    Diabetes Daughter    Hypertension Daughter    Diabetes Son    Cancer Paternal Uncle    Diabetes Maternal Grandmother    Hypertension Maternal Grandmother    Colon polyps Neg Hx    Esophageal cancer Neg Hx    Rectal cancer Neg Hx    Stomach cancer Neg Hx     SOCIAL HISTORY: Social History   Tobacco Use   Smoking status: Every Day  Current packs/day: 0.50    Types: Cigarettes    Passive exposure: Current   Smokeless tobacco: Never  Substance Use Topics   Alcohol use: Yes    Alcohol/week: 5.0 standard drinks of alcohol    Types: 5 Cans of beer per week    Comment: 5 drinks/week     No Known Allergies  Current Outpatient Medications  Medication Sig Dispense Refill   Acetaminophen (TYLENOL PO) Take by mouth.     albuterol (VENTOLIN HFA) 108 (90 Base) MCG/ACT inhaler INHALE 2 PUFFS BY MOUTH EVERY 4 HOURS AS NEEDED FOR WHEEZING FOR SHORTNESS OF BREATH 18 g 0   atorvastatin (LIPITOR) 40 MG tablet Take 1 tablet by mouth once daily 90 tablet 0   benzocaine (ORAJEL) 10 % mucosal gel Use as directed 1 application in the mouth or throat as needed for mouth pain. 5.3 g 0   blood glucose meter kit and  supplies KIT 1 each by Does not apply route daily. Dispense based on patient and insurance preference. Use up to four times daily as directed. (FOR ICD-9 250.00, 250.01). 1 each 0   cetirizine (ZYRTEC) 10 MG tablet Take 1 tablet (10 mg total) by mouth daily. 30 tablet 0   chlorthalidone (HYGROTON) 25 MG tablet Take 1 tablet (25 mg total) by mouth daily. 90 tablet 3   diclofenac Sodium (VOLTAREN) 1 % GEL APPLY 4 GRAMS  TOPICALLY 4 TIMES DAILY FOR 14 DAYS 200 g 0   diclofenac Sodium (VOLTAREN) 1 % GEL Apply 2 g topically 4 (four) times daily. 100 g 0   empagliflozin (JARDIANCE) 25 MG TABS tablet Take 1 tablet (25 mg total) by mouth daily. 90 tablet 3   fluticasone-salmeterol (WIXELA INHUB) 250-50 MCG/ACT AEPB Inhale 1 puff into the lungs in the morning and at bedtime. 60 each 5   glucose blood (ACCU-CHEK GUIDE) test strip USE 1 STRIP TWICE DAILY FOR BLOOD SUGAR. E11.9 100 each 0   Lancets Thin MISC 1 each by Does not apply route as needed. 100 each 0   losartan (COZAAR) 100 MG tablet Take 1 tablet (100 mg total) by mouth at bedtime. 90 tablet 3   metFORMIN (GLUCOPHAGE-XR) 500 MG 24 hr tablet Take 2 tablets (1,000 mg total) by mouth in the morning and at bedtime. 360 tablet 3   Multiple Vitamin (MULTIVITAMIN PO) Take by mouth.     nystatin-triamcinolone ointment (MYCOLOG) Apply 1 Application topically 2 (two) times daily. 30 g 0   omeprazole (PRILOSEC) 20 MG capsule Take 1 capsule by mouth once daily 90 capsule 0   OZEMPIC, 0.25 OR 0.5 MG/DOSE, 2 MG/3ML SOPN INJECT 0.5 MG INTO THE SKIN ONCE A WEEK 3 mL 0   Tiotropium Bromide Monohydrate (SPIRIVA RESPIMAT) 2.5 MCG/ACT AERS Inhale 2 puffs into the lungs daily. 4 g 3   Turmeric (QC TUMERIC COMPLEX PO) Take by mouth.     buPROPion (WELLBUTRIN SR) 150 MG 12 hr tablet Take 1 tablet (150 mg total) by mouth 2 (two) times daily. (Patient not taking: Reported on 12/24/2022) 60 tablet 3   No current facility-administered medications for this visit.    REVIEW  OF SYSTEMS:  [X]  denotes positive finding, [ ]  denotes negative finding Cardiac  Comments:  Chest pain or chest pressure:    Shortness of breath upon exertion:    Short of breath when lying flat:    Irregular heart rhythm:        Vascular    Pain in calf, thigh, or hip  brought on by ambulation: x   Pain in feet at night that wakes you up from your sleep:     Blood clot in your veins:    Leg swelling:         Pulmonary    Oxygen at home:    Productive cough:     Wheezing:         Neurologic    Sudden weakness in arms or legs:     Sudden numbness in arms or legs:     Sudden onset of difficulty speaking or slurred speech:    Temporary loss of vision in one eye:     Problems with dizziness:         Gastrointestinal    Blood in stool:     Vomited blood:         Genitourinary    Burning when urinating:     Blood in urine:        Psychiatric    Major depression:         Hematologic    Bleeding problems:    Problems with blood clotting too easily:        Skin    Rashes or ulcers:        Constitutional    Fever or chills:     PHYSICAL EXAM:   Vitals:   04/08/23 0921  BP: (!) 148/85  Pulse: 90  Resp: 20  Temp: 97.9 F (36.6 C)  SpO2: 96%  Weight: 197 lb (89.4 kg)  Height: 5' (1.524 m)   Body mass index is 38.47 kg/m.  GENERAL: The patient is a well-nourished female, in no acute distress. The vital signs are documented above. CARDIAC: There is a regular rate and rhythm.  VASCULAR: I do not detect carotid bruits. She has palpable femoral pulses.  She does not appear to have some calcium in the right femoral artery.  I can feel this by palpation. I cannot palpate pedal pulses.  She has monophasic Doppler signals in both feet.  She has no ischemic ulcers.  She has no significant lower extremity swelling. PULMONARY: There is good air exchange bilaterally without wheezing or rales. ABDOMEN: Soft and non-tender with normal pitched bowel sounds.  MUSCULOSKELETAL:  There are no major deformities or cyanosis. NEUROLOGIC: No focal weakness or paresthesias are detected. SKIN: There are no ulcers or rashes noted. PSYCHIATRIC: The patient has a normal affect.  DATA:    ARTERIAL DOPPLER STUDY: I have independently interpreted her arterial Doppler study today.  On the right side there is a monophasic posterior tibial and dorsalis pedis signal.  ABIs greater than 100%.  Her arteries are calcific and this is not reliable.  Her toe pressure on the right is 20 mmHg.  On the left side she has a monophasic posterior tibial and dorsalis pedis signal.  Again ABIs greater than 100% although this is unreliable.  Toe pressures 35 mmHg.  Alexandra Campos [facility] [phone]

## 2023-04-10 ENCOUNTER — Other Ambulatory Visit: Payer: Self-pay | Admitting: Student

## 2023-04-12 ENCOUNTER — Other Ambulatory Visit: Payer: Self-pay | Admitting: Student

## 2023-04-12 ENCOUNTER — Ambulatory Visit (INDEPENDENT_AMBULATORY_CARE_PROVIDER_SITE_OTHER): Payer: Medicare HMO | Admitting: Podiatry

## 2023-04-12 ENCOUNTER — Encounter: Payer: Self-pay | Admitting: Podiatry

## 2023-04-12 DIAGNOSIS — M722 Plantar fascial fibromatosis: Secondary | ICD-10-CM | POA: Diagnosis not present

## 2023-04-12 DIAGNOSIS — E1151 Type 2 diabetes mellitus with diabetic peripheral angiopathy without gangrene: Secondary | ICD-10-CM

## 2023-04-12 DIAGNOSIS — L84 Corns and callosities: Secondary | ICD-10-CM | POA: Diagnosis not present

## 2023-04-12 DIAGNOSIS — B351 Tinea unguium: Secondary | ICD-10-CM

## 2023-04-12 DIAGNOSIS — I10 Essential (primary) hypertension: Secondary | ICD-10-CM

## 2023-04-12 DIAGNOSIS — Z8719 Personal history of other diseases of the digestive system: Secondary | ICD-10-CM

## 2023-04-12 DIAGNOSIS — E1159 Type 2 diabetes mellitus with other circulatory complications: Secondary | ICD-10-CM

## 2023-04-12 NOTE — Progress Notes (Unsigned)
       Subjective:  Patient ID: Alexandra Campos, female    DOB: Jul 19, 1956,  MRN: [id number]  Alexandra Campos presents to clinic today for:  Chief Complaint  Patient presents with   Ingrown Toenail    Ingrown nails b/l great. Spur on both feet and sore spot on bottom of right foot  . Patient notes nails are thick and elongated, causing pain in shoe gear when ambulating.  Also has a painful corn on the left third toe.  Denies any numbness in the feet.  States that both heels on the plantar aspect have been sore.  Denies injury.  Also notes that she has a stiff right ankle due to previous ankle fracture with 3 pins in place  PCP is Dameron, Nolberto Hanlon, DO.  Date last seen was 02/22/2023.  Last A1c level was 8.7 on 02/22/2023.  No Known Allergies  Review of Systems: Negative except as noted in the HPI.  Objective:  There were no vitals filed for this visit.  Alexandra Campos is a pleasant 67 y.o. female in NAD. AAO x 3.  Vascular Examination: Patient has palpable DP pulse, absent PT pulse bilateral.  Delayed capillary refill bilateral toes.  Sparse digital hair bilateral.  Proximal to distal cooling WNL bilateral.    Dermatological Examination: Interspaces are clear with no open lesions noted bilateral.  Nails are 3-65mm thick, with yellowish/brown discoloration, subungual debris and distal onycholysis x10.  There is pain with compression of nails x10.  There are hyperkeratotic lesions noted on the dorsal aspect of the left third toe.Marland Kitchen  Neurological Examination: Protective sensation intact b/l LE. Vibratory sensation intact b/l LE.  Musculoskeletal Examination: Muscle strength 5/5 to all LE muscle groups b/l.  Pain on palpation of plantar central aspect of both heels.  No edema or calor is noted to the area.  No gaps or nodules are noted within the plantar fascia.  No pain on palpation of the Achilles tendon.     Latest Ref Rng & Units 02/22/2023    5:31 PM 07/09/2022    1:35 PM  Hemoglobin A1C   Hemoglobin-A1c 4.8 - 5.6 % 8.7  7.9      Patient qualifies for at-risk foot care because of *** .  Assessment/Plan: 1. Dermatophytosis of nail   2. Type II diabetes mellitus with peripheral circulatory disorder (HCC)   3. Corns   4. Plantar fascial fibromatosis     No orders of the defined types were placed in this encounter.  None  Mycotic nails x10 were sharply debrided with sterile nail nippers and power debriding burr to decrease bulk and length.  Hyperkeratotic lesions were shaved with #312 blade.   Return in about 3 months (around 07/13/2023) for [facility]   Clerance Lav, DPM, FACFAS [facility]     [address] [place], [place] [address]                Office [phone]  Fax [phone]

## 2023-04-13 NOTE — Patient Instructions (Signed)

## 2023-04-14 ENCOUNTER — Other Ambulatory Visit: Payer: Self-pay

## 2023-04-14 DIAGNOSIS — M1712 Unilateral primary osteoarthritis, left knee: Secondary | ICD-10-CM

## 2023-04-14 DIAGNOSIS — I70219 Atherosclerosis of native arteries of extremities with intermittent claudication, unspecified extremity: Secondary | ICD-10-CM

## 2023-04-19 NOTE — Telephone Encounter (Signed)
Patient received a rollator  06/30/2022, insurance will not cover for 3-5 years.  thanks!   Received from [employer].   Patient has been updated.

## 2023-04-27 ENCOUNTER — Other Ambulatory Visit: Payer: Self-pay | Admitting: Student

## 2023-04-27 ENCOUNTER — Telehealth: Payer: Self-pay

## 2023-04-27 NOTE — Telephone Encounter (Signed)
Patient is requesting a new Rolator.  Order was placed, however she is not eligible for one until ~2027. Last one received was in October of 2023.  She reports her insurance is requiring a prior [place].   Advised will need a detailed office visit explaining the need for a new Rolator.  Patient scheduled for 8/14.

## 2023-04-28 ENCOUNTER — Ambulatory Visit (INDEPENDENT_AMBULATORY_CARE_PROVIDER_SITE_OTHER): Payer: Medicare HMO | Admitting: Physician Assistant

## 2023-04-28 ENCOUNTER — Ambulatory Visit (INDEPENDENT_AMBULATORY_CARE_PROVIDER_SITE_OTHER): Payer: Medicare HMO | Admitting: Family Medicine

## 2023-04-28 ENCOUNTER — Encounter: Payer: Self-pay | Admitting: Physician Assistant

## 2023-04-28 ENCOUNTER — Ambulatory Visit: Payer: Medicare HMO

## 2023-04-28 VITALS — BP 137/80 | HR 88 | Temp 98.0°F | Ht 60.0 in | Wt 195.2 lb

## 2023-04-28 DIAGNOSIS — M545 Low back pain, unspecified: Secondary | ICD-10-CM

## 2023-04-28 DIAGNOSIS — M1712 Unilateral primary osteoarthritis, left knee: Secondary | ICD-10-CM | POA: Diagnosis not present

## 2023-04-28 DIAGNOSIS — I739 Peripheral vascular disease, unspecified: Secondary | ICD-10-CM | POA: Diagnosis not present

## 2023-04-28 DIAGNOSIS — G8929 Other chronic pain: Secondary | ICD-10-CM | POA: Diagnosis not present

## 2023-04-28 MED ORDER — LIDOCAINE HCL 1 % IJ SOLN
2.0000 mL | INTRAMUSCULAR | Status: AC | PRN
Start: 2023-04-28 — End: 2023-04-28
  Administered 2023-04-28: 2 mL

## 2023-04-28 MED ORDER — METHYLPREDNISOLONE ACETATE 40 MG/ML IJ SUSP
40.0000 mg | INTRAMUSCULAR | Status: AC | PRN
Start: 2023-04-28 — End: 2023-04-28
  Administered 2023-04-28: 40 mg via INTRA_ARTICULAR

## 2023-04-28 MED ORDER — BUPIVACAINE HCL 0.25 % IJ SOLN
2.0000 mL | INTRAMUSCULAR | Status: AC | PRN
Start: 2023-04-28 — End: 2023-04-28
  Administered 2023-04-28: 2 mL via INTRA_ARTICULAR

## 2023-04-28 NOTE — Patient Instructions (Signed)
It was wonderful to see you today! Thank you for choosing [facility].   Please bring ALL of your medications with you to every visit.   Today we talked about:  I will try to send in an order for a new Rolator to your insurance but I am not sure it will be covered. Our office will contact you about this.  Please follow up with PCP to discuss health maintenance  Call the clinic at [phone] if your symptoms worsen or you have any concerns.  Please be sure to schedule follow up at the front desk before you leave today.   Elberta Fortis, DO Family Medicine

## 2023-04-28 NOTE — Progress Notes (Signed)
    SUBJECTIVE:   CHIEF COMPLAINT / HPI:   DME - Rolator Patient requesting new rollator, last received a device in October 2023.  Now feels like the brakes do not work properly and states she had the wheel come off one time. 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 Uses the rollator to enhance balance and mobility. States without it see if she has pain in her back and hips prevent her from walking. Reports she was advised by her vascular surgeon that she needs to continue walking 2/2 to arterial blockages and a rollator enables her to do so.   Patient states she called [employer] and they indicated they would approve another rollator per her account.  PERTINENT  PMH / PSH: Hypertension, type 2 diabetes, CKD 3, PAD, tobacco use  OBJECTIVE:   BP 137/80   Pulse 88   Temp 98 F (36.7 C)   Ht 5' (1.524 m)   Wt 195 lb 3.2 oz (88.5 kg)   SpO2 98%   BMI 38.12 kg/m    General: Patient resting in chair comfortably, cane at her side. Pulm: Unlabored respirations MSK: Ambulates with cane, significant lumbar lordosis and kyphotic hunch. Unable to evaluate low back and hips this patient left prior to examination. Psych: Patient conversant and pleasant initially but became upset when discussing possibility of insurance not covering rollator. Gathered belongings and left abruptly.  ASSESSMENT/PLAN:   Low back pain Patient reports current rollator with mechanical dysfunction including brake and wheel issues. Patient received new rollator in October 2023 but states since that time it has degraded and hinders ambulation and ADLs. Patient left prior to physical exam. Existing DME order in place, RN to follow-up on status.  Recommend patient call medical supply company to discuss rollator part replacement.   Multiple care gaps, advised patient to schedule PCP.  Dr. Elberta Fortis, DO [facility]

## 2023-04-28 NOTE — Progress Notes (Signed)
Office Visit Note   Patient: Alexandra Campos           Date of Birth: 04/11/1956           MRN: [id number] Visit Date: 04/28/2023              Requested by: Darral Dash, DO [address] [place],  [place] [address] PCP: Darral Dash, DO   Assessment & Plan: Visit Diagnoses:  1. Unilateral primary osteoarthritis, left knee     Plan: Impression is left knee osteoarthritis.  Today, proceeded with left knee Monovisc injection.  She tolerated this well.  Follow-up as needed. Lot #[id number] Expiration date 10/21/2025  Follow-Up Instructions: Return if symptoms worsen or fail to improve.   Orders:  Orders Placed This Encounter  Procedures   Large Joint Inj   No orders of the defined types were placed in this encounter.     Procedures: Large Joint Inj: L knee on 04/28/2023 8:33 AM Indications: pain Details: 22 G needle, anterolateral approach Medications: 2 mL lidocaine 1 %; 2 mL bupivacaine 0.25 %; 40 mg methylPREDNISolone acetate 40 MG/ML      Clinical Data: No additional findings.   Subjective: Chief Complaint  Patient presents with   Left Knee - Follow-up    Monovisc    HPI patient is a pleasant 67 year old female with underlying left knee osteoarthritis who comes in today for Monovisc injection to the left knee.  Last Monovisc injection was 10/27/2022.  She had about 3-1/2 months relief from this injection.  Symptoms have returned.     Objective: Vital Signs: There were no vitals taken for this visit.    Ortho Exam stable left knee exam  Specialty Comments:  No specialty comments available.  Imaging: No new imaging   PMFS History: Patient Active Problem List   Diagnosis Date Noted   Obstructive lung disease (HCC) 07/09/2022   Rash 04/20/2022   Right upper quadrant pain 01/21/2022   Wheezing 12/13/2020   Low back pain 09/22/2020   Yeast infection involving the vagina and surrounding area 06/10/2020   Complex cyst of right ovary 04/09/2020    Atherosclerosis of artery of extremity with rest pain (HCC) 03/15/2020   Ovarian cyst 03/09/2020   Postmenopausal bleeding 02/07/2020   Abnormal ankle brachial index (ABI) 01/14/2020   Abnormal facial hair 11/24/2019   Stress incontinence of urine 10/03/2019   Type 2 DM with CKD stage 3 and hypertension (HCC) 07/03/2019   Class 3 severe obesity with body mass index (BMI) of 40.0 to 44.9 in adult (HCC) 06/16/2019   CKD (chronic kidney disease) stage 3, GFR 30-59 ml/min (HCC) 06/16/2019   Healthcare maintenance 06/16/2019   Carpal tunnel syndrome on right 06/16/2019   Hypertension 08/04/2018   Tobacco dependence 08/04/2018   Past Medical History:  Diagnosis Date   Arthritis    Diabetes mellitus without complication (HCC)    GERD (gastroesophageal reflux disease)    Hyperlipidemia    Hypertension     Family History  Problem Relation Age of Onset   Diabetes Mother    Hypertension Mother    Diabetes Father    Hypertension Father    Cancer Father    Diabetes Sister    Diabetes Sister    Diabetes Brother    Hypertension Brother    Colon cancer Brother 50   Diabetes Brother    Diabetes Daughter    Hypertension Daughter    Diabetes Son    Cancer Paternal Kateri Mc  Diabetes Maternal Grandmother    Hypertension Maternal Grandmother    Colon polyps Neg Hx    Esophageal cancer Neg Hx    Rectal cancer Neg Hx    Stomach cancer Neg Hx     Past Surgical History:  Procedure Laterality Date   ABDOMINAL AORTOGRAM W/LOWER EXTREMITY N/A 03/15/2020   Procedure: ABDOMINAL AORTOGRAM W/LOWER EXTREMITY;  Surgeon: Chuck Hint, MD;  Location[facility];  Service: Cardiovascular;  Laterality: N/A;   ANKLE SURGERY Right    CARPAL TUNNEL RELEASE Right 11/18/2022   Procedure: RIGHT CARPAL TUNNEL RELEASE;  Surgeon: Tarry Kos, MD;  Location: [facility];  Service: Orthopedics;  Laterality: Right;   INDUCED ABORTION     LYMPHADENECTOMY     Social History    Occupational History   Occupation: CNA    Comment: Retired  Tobacco Use   Smoking status: Every Day    Current packs/day: 0.50    Types: Cigarettes    Passive exposure: Current   Smokeless tobacco: Never  Vaping Use   Vaping status: Never Used  Substance and Sexual Activity   Alcohol use: Yes    Alcohol/week: 5.0 standard drinks of alcohol    Types: 5 Cans of beer per week    Comment: 5 drinks/week    Drug use: Yes    Types: Marijuana    Comment: last smoked cig today, last marijuana 11/16/2022   Sexual activity: Not on file

## 2023-04-28 NOTE — Assessment & Plan Note (Addendum)
Patient reports current rollator with mechanical dysfunction including brake and wheel issues. Patient received new rollator in October 2023 but states since that time it has degraded and hinders ambulation and ADLs. Patient left prior to physical exam. Existing DME order in place, RN to follow-up on status.  Recommend patient call medical supply company to discuss rollator part replacement.

## 2023-04-29 ENCOUNTER — Telehealth: Payer: Self-pay

## 2023-04-29 NOTE — Telephone Encounter (Signed)
Patient calls nurse line requesting to speak with PCP regarding concerns with visit yesterday. She states that [facility] will approve her request for a new rollator once we submit the documentation.   Patient also needs a letter for her apartment stating that her medical conditions keep her from working.   Will forward to PCP. Please return call to patient at [phone] with any questions.   Veronda Prude, RN

## 2023-05-04 NOTE — Telephone Encounter (Signed)
[employer] and spoke with representative. They state that there is no open case for PA or exception.   I asked if our office could submit PA with documentation regarding patient's need for equipment.   Representative states that patient needs to call member services to further address concern as she just received rollator in 2023.   If at that point, they feel that PA is necessary, they will contact our office.   Called patient and advised that she reach out to [facility].   Patient will call [employer] and keep [place] updated on status and if anything else is needed from our office.   Veronda Prude, RN

## 2023-05-04 NOTE — Telephone Encounter (Signed)
Pt calling to check on new Rolator order. Pt states [employer] told her all she is needs is a request from her Dr stating she needs a new Rolator. [employer] said they will make an exception.   Pt is also needing a note for her apartment stating that because of her medical conditions she can not work.   Please call pt to discuss. Her phone number is [phone].  Sunday Spillers, CMA

## 2023-05-10 NOTE — Telephone Encounter (Signed)
Patient returns call to nurse line requesting to speak with Dr. Melissa Noon.   She states that she has been calling since last Monday regarding letter and would like to speak with provider.   Please return call to patient at [phone].  Veronda Prude, RN

## 2023-05-14 NOTE — Telephone Encounter (Signed)
Patient scheduled with PCP for 9/16.

## 2023-05-26 ENCOUNTER — Other Ambulatory Visit: Payer: Self-pay | Admitting: Student

## 2023-05-31 ENCOUNTER — Ambulatory Visit (INDEPENDENT_AMBULATORY_CARE_PROVIDER_SITE_OTHER): Payer: Medicare HMO | Admitting: Student

## 2023-05-31 ENCOUNTER — Encounter: Payer: Self-pay | Admitting: Student

## 2023-05-31 VITALS — BP 128/78 | HR 92 | Ht 60.0 in | Wt 197.2 lb

## 2023-05-31 DIAGNOSIS — F17200 Nicotine dependence, unspecified, uncomplicated: Secondary | ICD-10-CM | POA: Diagnosis not present

## 2023-05-31 DIAGNOSIS — I10 Essential (primary) hypertension: Secondary | ICD-10-CM | POA: Diagnosis not present

## 2023-05-31 DIAGNOSIS — Z23 Encounter for immunization: Secondary | ICD-10-CM

## 2023-05-31 NOTE — Patient Instructions (Addendum)
It was great seeing you today.  As we discussed, - Schedule an appointment with Dr. Raymondo Band for pulmonary function tests. - Try to cut down on smoking, at least one cigarette per day. - Please continue to take your blood pressure medicines every day.  If you have any questions or concerns, please feel free to call the clinic.   Have a wonderful day,  Dr. Darral Dash  [facility] [phone]

## 2023-05-31 NOTE — Progress Notes (Signed)
    SUBJECTIVE:   CHIEF COMPLAINT / HPI:   Alexandra Campos is a 67 year-old female here for follow-up of below items:  She lives at [employer] (apartment complex for seniors) and needs a letter stating she is on disability.  Hypertension Checks her BP at home Takes Losartan  100 mg daily and Chlorthalidone  25 mg daily Thinks that alcohol intake increased her blood pressure Took her medicine at 9AM, but did not take it yesterday  Tobacco Use -Caring for her 3 year old mother which has been very challenging, she has had multiple falls and has dementia and stage 4 cancer -Not ready to quit smoking yet due to stressors listed above  -Smokes 1 PPD -Having more coughing spells. Coughing up clear phlegm  Still waiting to get her rollator.  She would like her influenza and COVID vaccinations today.  PERTINENT  PMH / PSH: Tobacco use, T2DM with CKD 3, HTN  OBJECTIVE:   BP 128/78   Pulse 92   Ht 5' (1.524 m)   Wt 197 lb 3.2 oz (89.4 kg)   SpO2 100%   BMI 38.51 kg/m  General: NAD, well appearing, chronically ill appearing Cardiac: RRR Neuro: A&O Respiratory: normal WOB on RA. Intermittent dry cough. No wheezing or crackles on auscultation, good lung sounds throughout Extremities: Moving all 4 extremities equally  ASSESSMENT/PLAN:   Hypertension BP initially elevated, but on recheck improved to 128/78 No change to current regimen  Tobacco dependence Discussed tobacco cessation and various resources Patient is in precontemplative stage at this point Explained long-term effects of smoking concern for worsening of respiratory status. Follow-up in 1 month with further discussion   I completed letter for independent living facility stating that patient is on disability and unable to work secondary to chronic conditions.  Anala Whisenant, DO [facility]

## 2023-06-01 NOTE — Assessment & Plan Note (Signed)
>>  ASSESSMENT AND PLAN FOR TOBACCO DEPENDENCE WRITTEN ON 06/01/2023  9:21 PM BY DAMERON, MARISA  Discussed tobacco cessation and various resources Patient is in precontemplative stage at this point Explained long-term effects of smoking concern for worsening of respiratory status. Follow-up in 1 month with further discussion

## 2023-06-01 NOTE — Assessment & Plan Note (Signed)
BP initially elevated, but on recheck improved to 128/78 No change to current regimen

## 2023-06-01 NOTE — Assessment & Plan Note (Signed)
Discussed tobacco cessation and various resources Patient is in precontemplative stage at this point Explained long-term effects of smoking concern for worsening of respiratory status. Follow-up in 1 month with further discussion

## 2023-06-03 ENCOUNTER — Encounter: Payer: Self-pay | Admitting: Family Medicine

## 2023-06-03 ENCOUNTER — Encounter: Payer: Self-pay | Admitting: Pharmacist

## 2023-06-03 ENCOUNTER — Ambulatory Visit (INDEPENDENT_AMBULATORY_CARE_PROVIDER_SITE_OTHER): Payer: Medicare HMO | Admitting: Pharmacist

## 2023-06-03 ENCOUNTER — Ambulatory Visit (INDEPENDENT_AMBULATORY_CARE_PROVIDER_SITE_OTHER): Payer: Medicare HMO | Admitting: Family Medicine

## 2023-06-03 VITALS — Ht 60.0 in | Wt 196.2 lb

## 2023-06-03 VITALS — BP 157/94 | HR 87 | Wt 196.1 lb

## 2023-06-03 DIAGNOSIS — F17200 Nicotine dependence, unspecified, uncomplicated: Secondary | ICD-10-CM | POA: Diagnosis not present

## 2023-06-03 DIAGNOSIS — J449 Chronic obstructive pulmonary disease, unspecified: Secondary | ICD-10-CM | POA: Diagnosis not present

## 2023-06-03 DIAGNOSIS — N950 Postmenopausal bleeding: Secondary | ICD-10-CM | POA: Diagnosis not present

## 2023-06-03 DIAGNOSIS — Z8719 Personal history of other diseases of the digestive system: Secondary | ICD-10-CM | POA: Diagnosis not present

## 2023-06-03 DIAGNOSIS — N83201 Unspecified ovarian cyst, right side: Secondary | ICD-10-CM

## 2023-06-03 MED ORDER — MISOPROSTOL 200 MCG PO TABS
ORAL_TABLET | ORAL | 2 refills | Status: AC
Start: 2023-06-03 — End: ?

## 2023-06-03 MED ORDER — OMEPRAZOLE 20 MG PO CPDR: 20.0000 mg | DELAYED_RELEASE_CAPSULE | Freq: Every day | ORAL | 1 refills | Status: DC

## 2023-06-03 MED ORDER — ACCU-CHEK GUIDE VI STRP: ORAL_STRIP | 1 refills | Status: DC

## 2023-06-03 MED ORDER — ACCU-CHEK GUIDE W/DEVICE KIT: 1.0000 | PACK | Freq: Once | 0 refills | Status: AC

## 2023-06-03 NOTE — Assessment & Plan Note (Signed)
Obstructive lung disease with increased coughing and continued tobacco use. Reports relief of coughing with current inhaler medications. Spirometry evaluation with pre-bronchodilator reveals moderate restrictive lung disease, although slight improvement compared to last PFTs in 2022 . Patient was able to describe appropriate inhaler technique. Patient medication adherence optimal. - Continue Wixela (fluticasone - salmeterol) 250-50 mcg one inhalation BID, Spiriva (tiotropium) 2.5 mcg two puffs daily, Ventolin HFA (albuterol sulfate) two puffs q4 hrs PRN   -Educated patient on purpose, proper use, potential adverse effects including risk of esophageal candidiasis and need to rinse mouth after each use.   -Reviewed results of pulmonary function tests.  Pt verbalized understanding of results and education.    Tobacco Use Disorder - Patient reports importance of quitting is 8/10 but due to stressors currently smoking ~8 cigarettes per day. Goal is to cut back to 7 cigarettes by January 2025.

## 2023-06-03 NOTE — Assessment & Plan Note (Signed)
>>  ASSESSMENT AND PLAN FOR TOBACCO DEPENDENCE WRITTEN ON 06/03/2023 12:51 PM BY KOVAL, PETER G, RPH-CPP  Tobacco Use Disorder - Patient reports importance of quitting is 8/10 but due to stressors currently smoking ~8 cigarettes per day. Goal is to cut back to 7 cigarettes by January 2025.

## 2023-06-03 NOTE — Assessment & Plan Note (Signed)
2nd occurrence. Give Cytotec, prior to visit and get EMB. Could not stay for this today.

## 2023-06-03 NOTE — Progress Notes (Signed)
   S:       Chief Complaint  Patient presents with   Medication Management    PFTs - Tobacco Use Disorder   68 y.o. female who presents for diabetes evaluation, education, and management. Patient arrives with stress and presents without any assistance.  Patient was referred and last seen by Primary Care Provider, Dr. Melissa Noon, on 05/31/2023.   PMH is significant for COPD, HTN, CKD, chronic tobacco use (~40 years).   Patient reports breathing has been suboptimal with increase coughing.   Medication adherence reported optimal Patient reports last dose of COPD medications was yesterday Current COPD medications: Wixela Inhub(fluticasone - salmeterol) 250-50 mcg one puff BID, Spiriva respimat (tiotropium) 2.5 mcg two puffs daily Rescue inhaler use frequency: Ventolin HFA (albuterol sulfate) two puffs q4 hrs PRN - last use last week  O: Review of Systems  All other systems reviewed and are negative.   Physical Exam Constitutional:      Appearance: Normal appearance.  Neurological:     Mental Status: She is alert.  Psychiatric:        Mood and Affect: Mood normal.        Behavior: Behavior normal.        Thought Content: Thought content normal.        Judgment: Judgment normal.    See "scanned report" or Documentation Flowsheet (discrete results - PFTs) for Spirometry results. Patient provided good effort while attempting spirometry.   Lung Age = 28 (previously 22 years in 2022 - PFT)  Patient is participating in a Managed Medicaid Plan:  Yes  A/P: Obstructive lung disease with increased coughing and continued tobacco use. Reports relief of coughing with current inhaler medications. Spirometry evaluation with pre-bronchodilator reveals moderate restrictive lung disease, although slight improvement compared to last PFTs in 2022 . Patient was able to describe appropriate inhaler technique. Patient medication adherence optimal. - Continue Wixela (fluticasone - salmeterol) 250-50  mcg one inhalation BID, Spiriva (tiotropium) 2.5 mcg two puffs daily, Ventolin HFA (albuterol sulfate) two puffs q4 hrs PRN   -Educated patient on purpose, proper use, potential adverse effects including risk of esophageal candidiasis and need to rinse mouth after each use.   -Reviewed results of pulmonary function tests.  Pt verbalized understanding of results and education.    Tobacco Use Disorder - Patient reports importance of quitting is 8/10 but due to stressors currently smoking ~8 cigarettes per day. Goal is to cut back to 7 cigarettes by January 2025.  Diabetes - Patient requested refill for Accu-check meter and strips, which was sent to the pharmacy. Instructed patient to request refill at the pharmacy for Jardiance (empagliflozin) 25 mg daily.   Other refills  History of GERD - Sent in refill for Omeprazole 20 mg daily. - Readdress use at next PCP visit.   Written patient instructions provided.   Total time in face to face counseling 24 minutes.    Follow-up:  Pharmacist January 2025 PCP clinic visit 3 months Patient seen with Alesia Banda, PharmD Candidate.

## 2023-06-03 NOTE — Assessment & Plan Note (Signed)
According to radiology, appears similar to previous. Previously thought to be a dermoid--check MRI and Roma

## 2023-06-03 NOTE — Patient Instructions (Addendum)
Nice to see you today!  Lung tests showed slight improvement compared to last test.   Medication Changes: Sent prescriptions for Accu-check guide, test strips, omeprazole 20 mg daily to pharmacy.  Continue all other medication the same.  Tobacco Patient Instructions  Quitting smoking is one of the most important decisions you can make for your current and future health. Consider what you dislike about smoking and how quitting could personally benefit you. Try to cut down. Aim for reducing the amount you smoke to 7 cigarettes over the next couple of months.  My target quit date is: January 2025  Starting today, Be a Quitter!  Remind yourself why you want to quit.  Delay your first cigarette of the day for as long as possible.  Start cleaning out all pockets, drawers, and your car of cigarettes.  Getting Through the Cravings Once You Are Smoke Free: Each craving will last about 10 minutes, whether or not you smoke. Here's how to get through the cravings without cigarettes:  DELAY: Tell yourself that you'll wait for the next craving. Do it every time! DEEP BREATHS: One reason smoking feels good is because you breathe in deeply to inhale. Take four slow, deep breaths and feel the relaxation without the hamful effects of cigarettes. DRINK WATER: Drink a glass of cool water. It will give your hands and mouth something to do and will help flush the nicotine out of your system faster. DIVERT: Do something else -- brush your teeth, take a walk, call a friend who can offer you support. Just moving onto something other than thinking about cigarettes will move you through the craving.   Frequently Asked Questions  What can I do when I get the urge to smoke? To get through the urge to smoke, try the following:  Review your reasons for quitting and think of all the benefits to your health, your finances, and your family.  Remind yourself that there is no such thing as just one cigarette -- or  even one puff.  Ride out the desire to smoke. Use the 4 Os -- Delay, Deep Breaths, Drink Water and Divert to get you through. The craving will go away eventually. Do not fool yourself into thinking you can have just one cigarette.  Any tips on how to deal with stress? Stress is a natural part of life. The key is to deal with it without reaching for a cigarette. Taking deep breaths, counting backwards from 10 and asking yourself 1-how big a deal is this?"  Writing down your feelings, talking with a friend and doing things like positive self-talk and meditation are some other ways that people deal with daily stress.  What if I start smoking again? Slips happen. Most people try to quit smoking a few times before they are successful. Don't beat yourself up if this happens to you! Ask yourself if this was a slip or a relapse. A slip is a one-time mistake that is quickly corrected. A relapse is going back to your old smoking habits.   If you slip, don't give up. Think of it as a learning experience. Ask yourself what went wrong and renew your commitment to staying away from smoking for good.  If you relapse, try not to get discouraged. Ask yourself the question "What caused me to start smoking?" Figure out what helped you and what didn't when you tried to quit. Knowing why you relapsed is useful information for your next attempt to quit.

## 2023-06-03 NOTE — Progress Notes (Signed)
   Subjective:    Patient ID: Alexandra Campos is a 67 y.o. female presenting with No chief complaint on file.  on 06/03/2023  HPI: Patient was seen today for PMB. Menopause at age [age]. No cycle since then. Seen in 2021 for similar with normal CA 125 and negative EMB-though needed cytotec to get tissue and was only atrophic. No FH of GYN cancers. Had several days of spotting in 6/24. Has been waiting for this appointment x 3 mo. Had pelvic sonogram which showed several fibroids, an ovarian cyst (similar to previous) and an endometrial stripe @ 4 mm.  Review of Systems  Constitutional:  Negative for chills and fever.  Respiratory:  Negative for shortness of breath.   Cardiovascular:  Negative for chest pain.  Gastrointestinal:  Negative for abdominal pain, nausea and vomiting.  Genitourinary:  Negative for dysuria.  Skin:  Negative for rash.      Objective:    BP (!) 157/94   Pulse 87   Wt 196 lb 1.6 oz (89 kg)   BMI 38.30 kg/m  Physical Exam Exam conducted with a chaperone present.  Constitutional:      General: She is not in acute distress.    Appearance: She is well-developed.  HENT:     Head: Normocephalic and atraumatic.  Eyes:     General: No scleral icterus. Cardiovascular:     Rate and Rhythm: Normal rate.  Pulmonary:     Effort: Pulmonary effort is normal.  Abdominal:     Palpations: Abdomen is soft.  Musculoskeletal:     Cervical back: Neck supple.  Skin:    General: Skin is warm and dry.  Neurological:     Mental Status: She is alert and oriented to person, place, and time.         Assessment & Plan:   Problem List Items Addressed This Visit       Unprioritized   Postmenopausal bleeding    2nd occurrence. Give Cytotec, prior to visit and get EMB. Could not stay for this today.      Ovarian cyst - Primary    According to radiology, appears similar to previous. Previously thought to be a dermoid--check MRI and Roma      Relevant Orders   MR  PELVIS W WO CONTRAST     Return in about 4 weeks (around 07/01/2023) for endometrial biopsy.  Reva Bores, MD 06/03/2023 4:50 PM

## 2023-06-03 NOTE — Assessment & Plan Note (Signed)
Tobacco Use Disorder - Patient reports importance of quitting is 8/10 but due to stressors currently smoking ~8 cigarettes per day. Goal is to cut back to 7 cigarettes by January 2025.

## 2023-06-03 NOTE — Progress Notes (Signed)
Reviewed and agree with Dr Koval's plan.   

## 2023-06-07 IMAGING — MG MM DIGITAL SCREENING BILAT W/ TOMO AND CAD
8 of 14 series · 8 of 40 positions shown · non-contrast
Comparison: Previous exam(s).

ACR Breast Density Category a: The breast tissue is almost entirely
fatty.

CLINICAL DATA: Screening.

EXAM:
DIGITAL SCREENING BILATERAL MAMMOGRAM WITH TOMOSYNTHESIS AND CAD
TECHNIQUE: Bilateral screening digital craniocaudal and mediolateral oblique
mammograms were obtained. Bilateral screening digital breast
tomosynthesis was performed. The images were evaluated with
computer-aided detection.

[L CC synth-2D (1 of 2)]
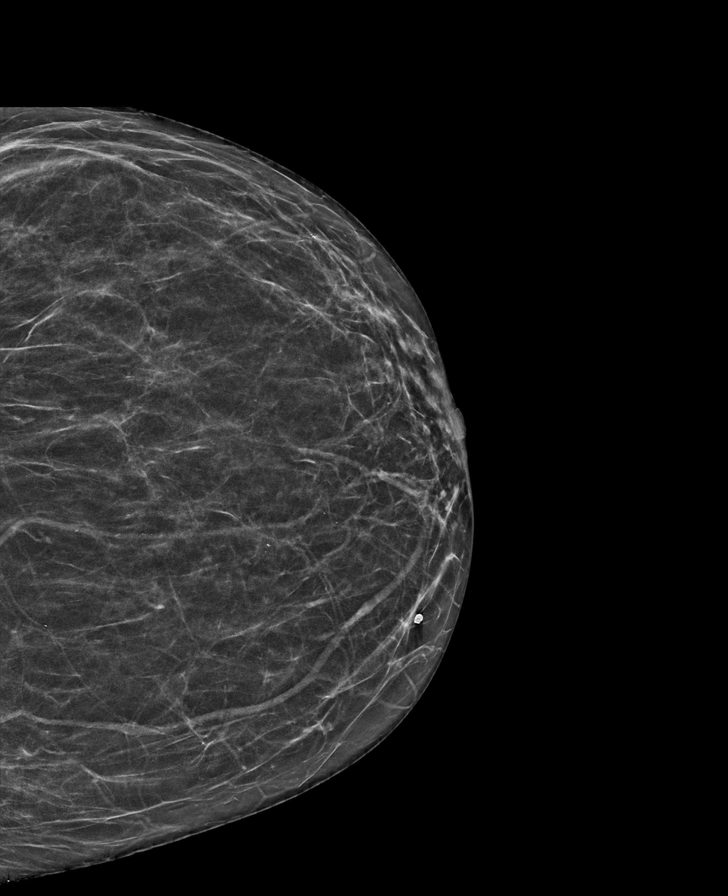

[R CC synth-2D (1 of 2)]
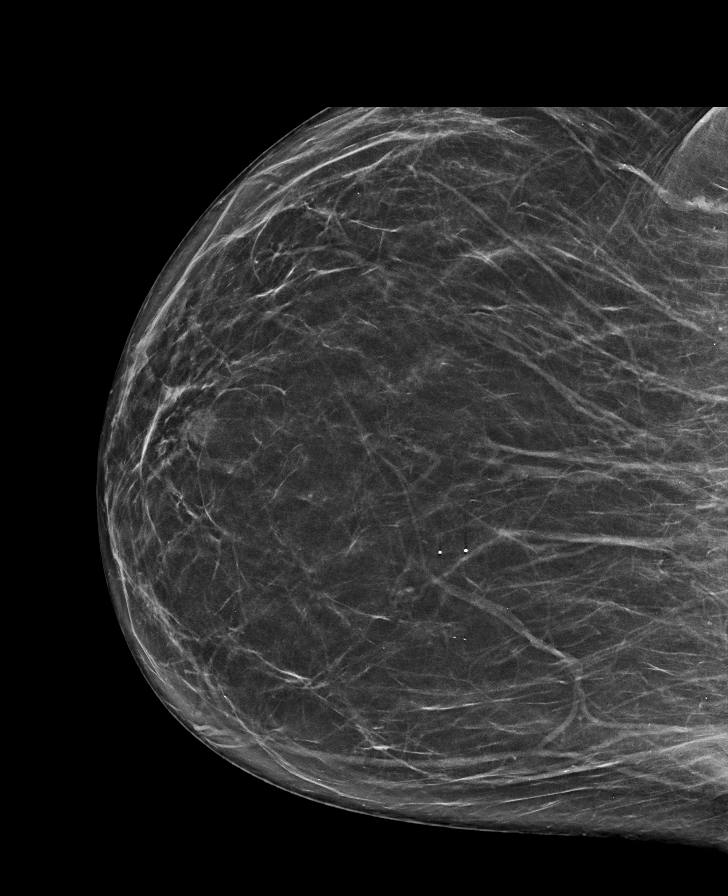

[L MLO synth-2D (1 of 2)]
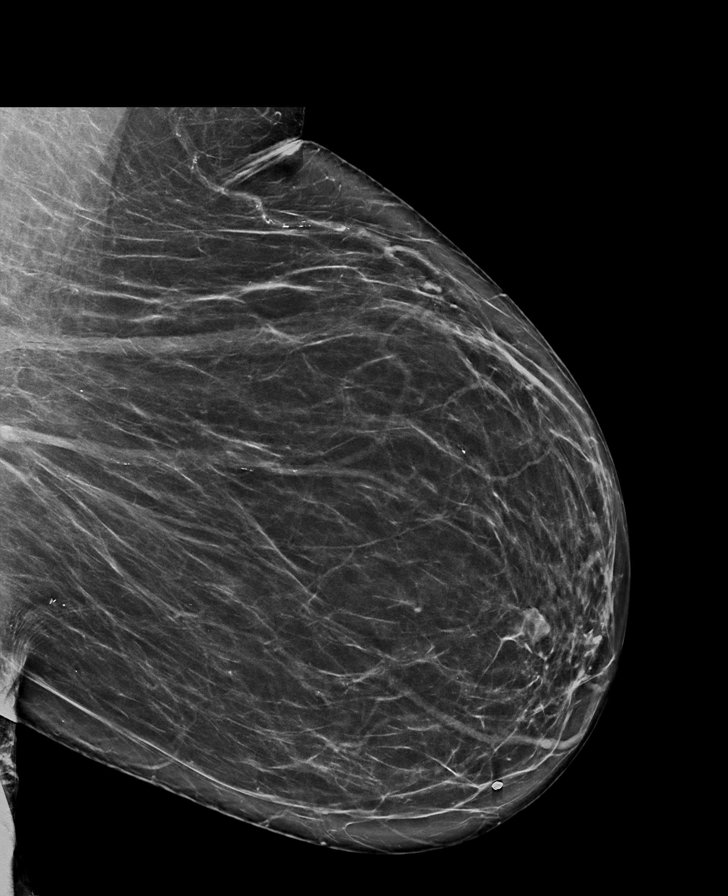

[L CC synth-2D (2 of 2)]
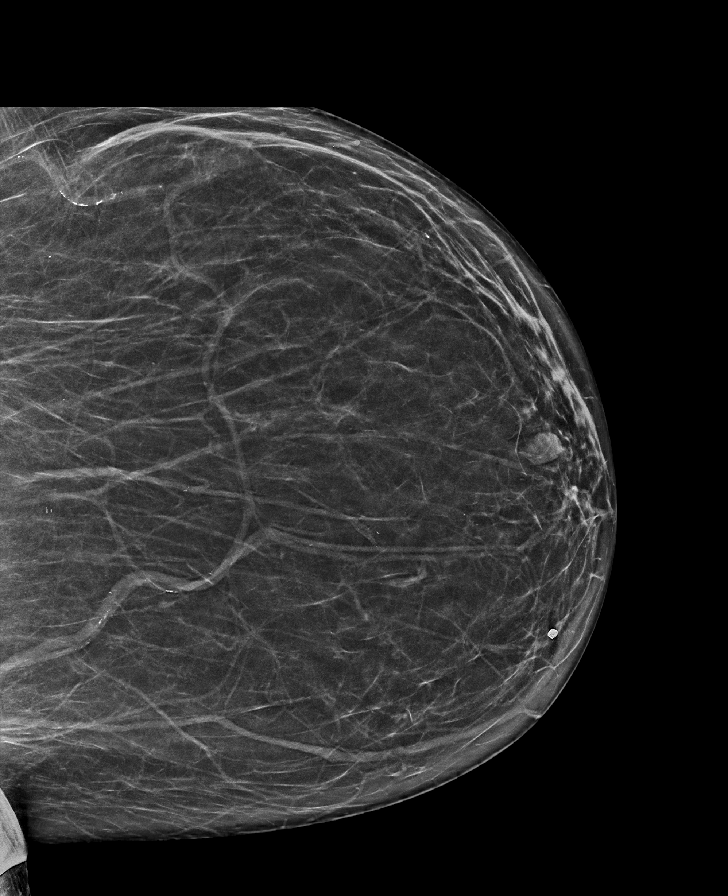

[L MLO synth-2D (2 of 2)]
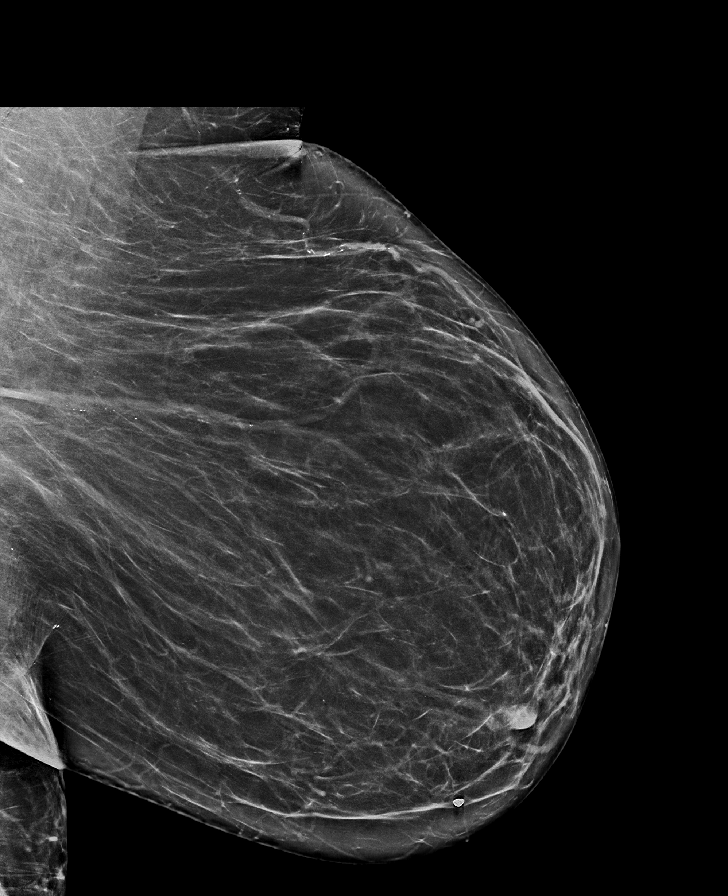

[R CC synth-2D (2 of 2)]
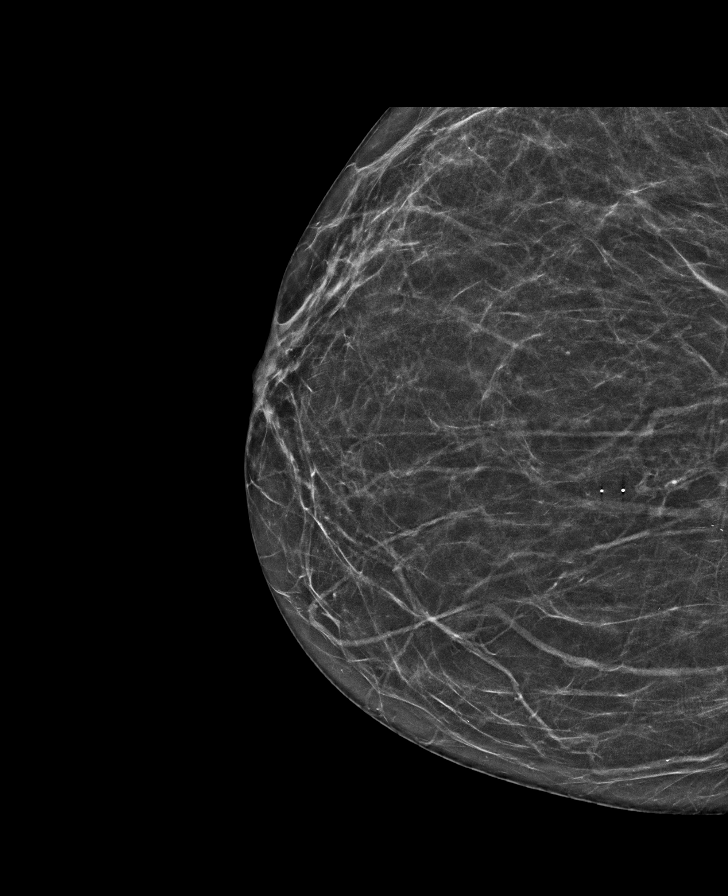

[R MLO synth-2D]
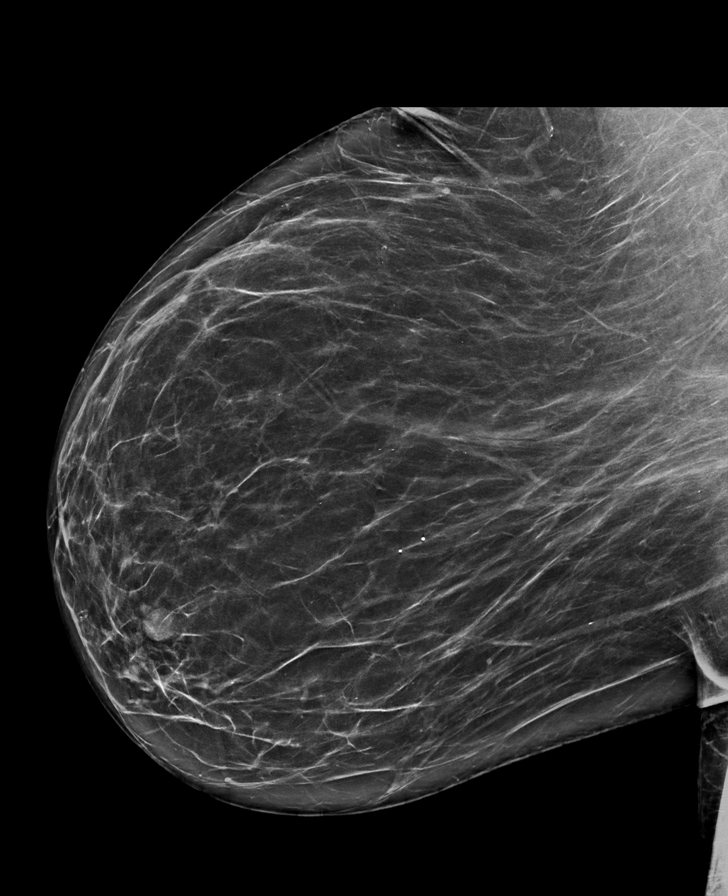

[R CC tomo · tomo slice 31/61.0]
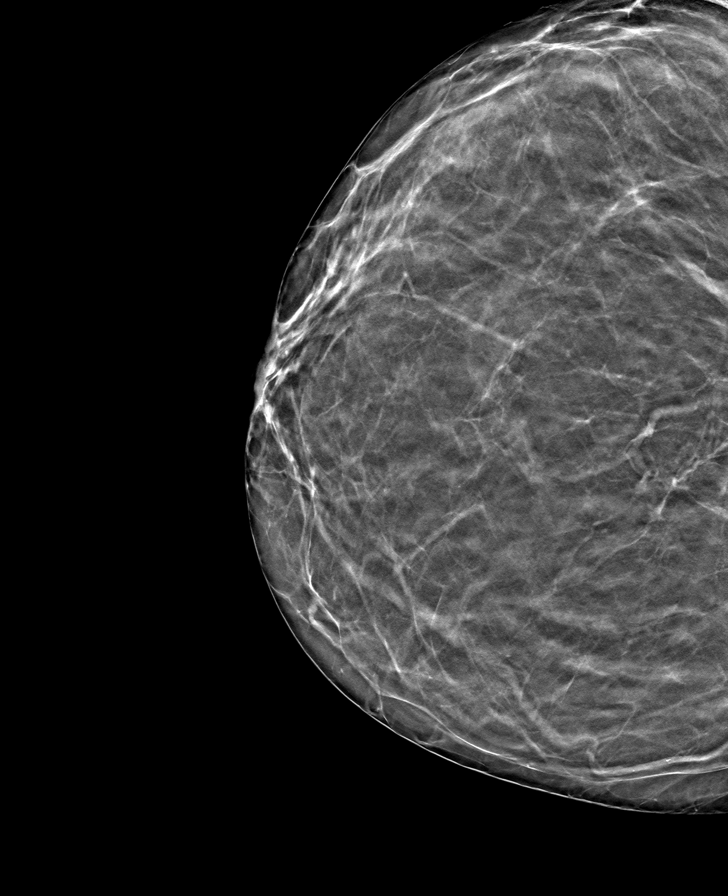

[8 of 40 positions shown; findings below may reference images not displayed]

FINDINGS: There are no findings suspicious for malignancy.
IMPRESSION: No mammographic evidence of malignancy. A result letter of this
screening mammogram will be mailed directly to the patient.

RECOMMENDATION:
Screening mammogram in one year. (Code:0E-3-N98)

BI-RADS CATEGORY  1: Negative.

## 2023-06-09 NOTE — Telephone Encounter (Signed)
Patient was informed that a new medication increase was sent in to his pharmacy for 20mg  of Cialis.

## 2023-06-10 ENCOUNTER — Encounter: Payer: Self-pay | Admitting: Family Medicine

## 2023-06-10 ENCOUNTER — Telehealth: Payer: Self-pay | Admitting: Lactation Services

## 2023-06-10 NOTE — Telephone Encounter (Signed)
Received request from Pharmacy to clarify Rx for Misoprostol. Per chart review, patient is to insert 2 tablets into vagina in the evening and again in the morning prior to appointment.  Called pharmacy to clarify that patient does need 4 tablets with no refills. Pharmacist voiced understanding and will fill medication.   Called patient to inform her Rx is being filled. Reviewed how to administer prior to [facility] appointment.

## 2023-06-11 ENCOUNTER — Other Ambulatory Visit: Payer: Self-pay

## 2023-06-11 NOTE — Telephone Encounter (Signed)
Patient returns call to nurse line regarding rollator.   She asks that we submit PA through [employer]. Attempted to submit through [employer].   Rollator will not be covered due to patient receiving in 06/2022.   Called patient and provided with this update. She states that she will call [employer] back next week to try to get exception. She will call me back if we need to submit anything on her behalf.   Veronda Prude, RN

## 2023-06-12 MED ORDER — ACCU-CHEK SOFTCLIX LANCETS MISC: 12 refills | Status: DC

## 2023-06-14 ENCOUNTER — Other Ambulatory Visit: Payer: Self-pay

## 2023-06-14 ENCOUNTER — Ambulatory Visit (INDEPENDENT_AMBULATORY_CARE_PROVIDER_SITE_OTHER): Payer: Medicare HMO | Admitting: Family Medicine

## 2023-06-14 ENCOUNTER — Other Ambulatory Visit (HOSPITAL_COMMUNITY)
Admission: RE | Admit: 2023-06-14 | Discharge: 2023-06-14 | Disposition: A | Discharge status: Home / Self Care | Payer: Medicare HMO | Source: Ambulatory Visit | Attending: Family Medicine | Admitting: Family Medicine

## 2023-06-14 ENCOUNTER — Encounter: Payer: Self-pay | Admitting: Family Medicine

## 2023-06-14 VITALS — BP 141/88 | HR 92 | Wt 194.7 lb

## 2023-06-14 DIAGNOSIS — Z01411 Encounter for gynecological examination (general) (routine) with abnormal findings: Secondary | ICD-10-CM | POA: Insufficient documentation

## 2023-06-14 DIAGNOSIS — N83201 Unspecified ovarian cyst, right side: Secondary | ICD-10-CM | POA: Diagnosis not present

## 2023-06-14 DIAGNOSIS — Z124 Encounter for screening for malignant neoplasm of cervix: Secondary | ICD-10-CM | POA: Diagnosis not present

## 2023-06-14 DIAGNOSIS — Z1151 Encounter for screening for human papillomavirus (HPV): Secondary | ICD-10-CM | POA: Insufficient documentation

## 2023-06-14 DIAGNOSIS — N950 Postmenopausal bleeding: Secondary | ICD-10-CM | POA: Diagnosis not present

## 2023-06-14 NOTE — Progress Notes (Signed)
   Subjective:    Patient ID: Alexandra Campos is a 67 y.o. female presenting with Procedure  on 06/14/2023  HPI: Here today for EMB. Previously seen for same in 2021. U/S reveals stripe of 4 mm. She has fibroids. Also, noted is ovarian cyst (unchanged since 2021). Needs labs and MRI for further clarification.  Review of Systems  Constitutional:  Negative for chills and fever.  Respiratory:  Negative for shortness of breath.   Cardiovascular:  Negative for chest pain.  Gastrointestinal:  Negative for abdominal pain, nausea and vomiting.  Genitourinary:  Negative for dysuria.  Skin:  Negative for rash.      Objective:    There were no vitals taken for this visit. Physical Exam Exam conducted with a chaperone present.  Constitutional:      General: She is not in acute distress.    Appearance: She is well-developed.  HENT:     Head: Normocephalic and atraumatic.  Eyes:     General: No scleral icterus. Cardiovascular:     Rate and Rhythm: Normal rate.  Pulmonary:     Effort: Pulmonary effort is normal.  Abdominal:     Palpations: Abdomen is soft.  Musculoskeletal:     Cervical back: Neck supple.  Skin:    General: Skin is warm and dry.  Neurological:     Mental Status: She is alert and oriented to person, place, and time.    Procedure: Patient given informed consent, signed copy in the chart, time out was performed. Appropriate time out taken. . The patient was placed in the lithotomy position and the cervix brought into view with sterile speculum.  Portio of cervix cleansed x 2 with betadine swabs.  A tenaculum was placed in the anterior lip of the cervix.  An os finder was used. Cytotec tablets noted in vagina. The uterus was sounded for depth of 7.5 cm. A pipelle was introduced to into the uterus, suction created,  and an endometrial sample was obtained. All equipment was removed and accounted for.  The patient tolerated the procedure well.       Assessment & Plan:    Problem List Items Addressed This Visit       Unprioritized   Postmenopausal bleeding    X 1 only and 3 months ago. Endometrial stripe is 4mm. S/p Biopsy today.      Relevant Orders   Surgical pathology( [place]/ [facility])   Ovarian cyst - Primary    ROMA test today MRI scheduled      Relevant Orders   Ovarian Malignancy Risk-ROMA   Other Visit Diagnoses     Screening for cervical cancer       should be last pap needed   Relevant Orders   Cytology - PAP( [facility])       Return if symptoms worsen or fail to improve.  Reva Bores, MD 06/14/2023 8:31 AM

## 2023-06-14 NOTE — Assessment & Plan Note (Signed)
X 1 only and 3 months ago. Endometrial stripe is 4mm. S/p Biopsy today.

## 2023-06-14 NOTE — Assessment & Plan Note (Signed)
ROMA test today MRI scheduled

## 2023-06-15 LAB — PREMENOPAUSAL INTERP: HIGH

## 2023-06-15 LAB — OVARIAN MALIGNANCY RISK-ROMA
Cancer Antigen (CA) 125: 19.4 U/mL (ref 0.0–38.1)
HE4: 157 pmol/L — ABNORMAL HIGH (ref 0.0–96.5)
Postmenopausal ROMA: 3.41 — ABNORMAL HIGH
Premenopausal ROMA: 5.55 — ABNORMAL HIGH

## 2023-06-15 LAB — POSTMENOPAUSAL INTERP: HIGH

## 2023-06-15 LAB — SURGICAL PATHOLOGY

## 2023-06-16 ENCOUNTER — Ambulatory Visit (HOSPITAL_COMMUNITY): Payer: Medicare HMO

## 2023-06-17 DIAGNOSIS — H524 Presbyopia: Secondary | ICD-10-CM | POA: Diagnosis not present

## 2023-06-17 LAB — CYTOLOGY - PAP
Adequacy: ABSENT
Comment: NEGATIVE
Diagnosis: NEGATIVE
High risk HPV: NEGATIVE

## 2023-06-20 ENCOUNTER — Other Ambulatory Visit: Payer: Self-pay | Admitting: Student

## 2023-06-22 ENCOUNTER — Ambulatory Visit (HOSPITAL_COMMUNITY)
Admission: RE | Admit: 2023-06-22 | Discharge: 2023-06-22 | Disposition: A | Discharge status: Home / Self Care | Payer: Medicare HMO | Source: Ambulatory Visit | Attending: Family Medicine | Admitting: Family Medicine

## 2023-06-22 ENCOUNTER — Other Ambulatory Visit: Payer: Self-pay | Admitting: Student

## 2023-06-22 DIAGNOSIS — D252 Subserosal leiomyoma of uterus: Secondary | ICD-10-CM | POA: Diagnosis not present

## 2023-06-22 DIAGNOSIS — N83201 Unspecified ovarian cyst, right side: Secondary | ICD-10-CM | POA: Insufficient documentation

## 2023-06-22 DIAGNOSIS — N838 Other noninflammatory disorders of ovary, fallopian tube and broad ligament: Secondary | ICD-10-CM | POA: Diagnosis not present

## 2023-06-22 DIAGNOSIS — D270 Benign neoplasm of right ovary: Secondary | ICD-10-CM | POA: Diagnosis not present

## 2023-06-22 DIAGNOSIS — M7989 Other specified soft tissue disorders: Secondary | ICD-10-CM | POA: Diagnosis not present

## 2023-06-22 MED ORDER — GADOBUTROL 1 MMOL/ML IV SOLN
9.0000 mL | Freq: Once | INTRAVENOUS | Status: AC | PRN
  Administered 2023-06-22: 9 mL via INTRAVENOUS

## 2023-06-22 NOTE — Telephone Encounter (Signed)
Patient returns call to nurse line regarding rollator.   She states that [facility] has been trying to reach [place] regarding this request.   Called [employer] and [employer]. I have completed [employer] Prior authorization form and faxed back to [facility].   Called patient and provided with update.   Veronda Prude, RN

## 2023-06-23 ENCOUNTER — Telehealth: Payer: Self-pay | Admitting: General Practice

## 2023-06-23 NOTE — Telephone Encounter (Signed)
Patient called and left message on nurse voicemail line stating she doesn't understand her results and would like someone to call her back.   Called patient and discussed that I cannot tell where the provider has reviewed those results yet and it may be because she is waiting for her MRI results which take a couple days to come back. Told patient I would reach out to the doctor and let them know she has questions about her results. Patient verbalized understanding.

## 2023-07-07 ENCOUNTER — Telehealth: Payer: Self-pay

## 2023-07-07 NOTE — Telephone Encounter (Signed)
This encounter was opened/created in error.   Maureen Ralphs RN on 07/07/23 at 1355

## 2023-07-07 NOTE — Telephone Encounter (Signed)
Received returned call from [facility] regarding request for Rollator.   I was given number for clinical intake at [facility]: [phone] to discuss authorization.   Called this number. Spoke with representative and provided CPT codes and NPI and Tax ID for [employer]. I was advised that prior authorization was not needed and to contact DME supplier.   Called [employer]. Representative advised that they would reach out to their team for further processing.   Called patient and provided with update.   Veronda Prude, RN

## 2023-07-07 NOTE — Telephone Encounter (Signed)
-----   Message from Reva Bores sent at 07/07/2023  7:47 AM EDT ----- Please have patient schedule back to discuss results and next steps.

## 2023-07-07 NOTE — Telephone Encounter (Signed)
Patient needs to be scheduled for visit with provider to discuss imaging test results, per Dr. Shawnie Pons.   Routed to front office.   Maureen Ralphs RN on 07/07/23 at 1356

## 2023-07-12 ENCOUNTER — Encounter: Payer: Self-pay | Admitting: Obstetrics and Gynecology

## 2023-07-12 ENCOUNTER — Ambulatory Visit (INDEPENDENT_AMBULATORY_CARE_PROVIDER_SITE_OTHER): Payer: Medicare HMO | Admitting: Obstetrics and Gynecology

## 2023-07-12 VITALS — BP 124/66 | HR 87 | Wt 195.9 lb

## 2023-07-12 DIAGNOSIS — D270 Benign neoplasm of right ovary: Secondary | ICD-10-CM

## 2023-07-12 DIAGNOSIS — R978 Other abnormal tumor markers: Secondary | ICD-10-CM | POA: Diagnosis not present

## 2023-07-12 NOTE — Progress Notes (Signed)
GYNECOLOGY VISIT  Patient name: Alexandra Campos MRN [id number]  Date of birth: Mar 30, 1956 Chief Complaint:   Results  History:  Alexandra Campos is a 67 y.o. G3P2010 being seen today for review of MRI results.  Patient notes no new changes in bowel habits. Has had early satiety due to ozempic. No additional episodes of vaginal bleeding since last complaint. Reports occasional abdominal pain. Notes that her mother recently died of cancer and dementia.   Past Medical History:  Diagnosis Date   Abnormal facial hair 11/24/2019   Arthritis    Complex cyst of right ovary 04/09/2020   Likely a dermoid. Normal CA 125     Diabetes mellitus without complication (HCC)    GERD (gastroesophageal reflux disease)    Hyperlipidemia    Hypertension    Ovarian cyst 03/09/2020   CA-125 17.3     Postmenopausal bleeding 02/07/2020   Rash 04/20/2022   Right upper quadrant pain 01/21/2022   Wheezing 12/13/2020   Yeast infection involving the vagina and surrounding area 06/10/2020    Past Surgical History:  Procedure Laterality Date   ABDOMINAL AORTOGRAM W/LOWER EXTREMITY N/A 03/15/2020   Procedure: ABDOMINAL AORTOGRAM W/LOWER EXTREMITY;  Surgeon: Chuck Hint, MD;  Location: [facility] INVASIVE CV LAB;  Service: Cardiovascular;  Laterality: N/A;   ANKLE SURGERY Right    CARPAL TUNNEL RELEASE Right 11/18/2022   Procedure: RIGHT CARPAL TUNNEL RELEASE;  Surgeon: Tarry Kos, MD;  Location: [facility];  Service: Orthopedics;  Laterality: Right;   INDUCED ABORTION     LYMPHADENECTOMY      The following portions of the patient's history were reviewed and updated as appropriate: allergies, current medications, past family history, past medical history, past social history, past surgical history and problem list.   Health Maintenance:   Last pap     Component Value Date/Time   DIAGPAP  06/14/2023 0912    - Negative for intraepithelial lesion or malignancy (NILM)   DIAGPAP  11/09/2019  1650    - Negative for intraepithelial lesion or malignancy (NILM)   HPVHIGH Negative 06/14/2023 0912   HPVHIGH Negative 11/09/2019 1650   ADEQPAP  06/14/2023 0912    Satisfactory for evaluation; transformation zone component ABSENT.   ADEQPAP  11/09/2019 1650    Satisfactory for evaluation; transformation zone component PRESENT.    High Risk HPV: Positive  Adequacy:  Satisfactory for evaluation, transformation zone component PRESENT  Diagnosis:  Atypical squamous cells of undetermined significance (ASC-US)  Last mammogram: 03/2023 BIRADS 1   Review of Systems:  Pertinent items are noted in HPI. Comprehensive review of systems was otherwise negative.   Objective:  Physical Exam BP 124/66   Pulse 87   Wt 195 lb 14.4 oz (88.9 kg)   BMI 38.26 kg/m    Physical Exam Vitals and nursing note reviewed.  Constitutional:      Appearance: Normal appearance.  HENT:     Head: Normocephalic and atraumatic.  Pulmonary:     Effort: Pulmonary effort is normal.  Skin:    General: Skin is warm and dry.  Neurological:     General: No focal deficit present.     Mental Status: She is alert.  Psychiatric:        Mood and Affect: Mood normal.        Behavior: Behavior normal.        Thought Content: Thought content normal.        Judgment: Judgment normal.  Labs and Imaging MR PELVIS W WO CONTRAST  Result Date: 07/03/2023 CLINICAL DATA:  Right ovarian mass on recent ultrasound. EXAM: MRI PELVIS WITHOUT AND WITH CONTRAST TECHNIQUE: Multiplanar multisequence MR imaging of the pelvis was performed both before and after administration of intravenous contrast. CONTRAST:  9mL GADAVIST GADOBUTROL 1 MMOL/ML IV SOLN COMPARISON:  Ultrasound on 02/24/2023 and 02/29/2020 FINDINGS: Lower Urinary Tract: No urinary bladder or urethral abnormality identified. Bowel: Unremarkable pelvic bowel loops. Vascular/Lymphatic: Unremarkable. No pathologically enlarged pelvic lymph nodes identified.  Reproductive: -- Uterus: Measures 6.7 x 3.4 x 4.6 cm (volume = 55 cm^3). Multiple intramural and subserosal fibroids are seen, largest in the anterior uterine corpus measuring 2.7 x 2.6 x 2.5 cm. No abnormal endometrial thickening demonstrated. Cervix and vagina are unremarkable. -- Right ovary: A cystic and solid right ovarian mass is seen, which measures 7.2 x 5.9 x 7.1 cm. This mass shows prominent internal soft tissue nodule which contains fat this lesion shows no significant change in size since earlier ultrasound on 2021. This is consistent with ovarian dermoid. -- Left ovary:  Not visualized, however no adnexal mass identified. Other: No peritoneal thickening or abnormal free fluid. Musculoskeletal:  Unremarkable. IMPRESSION: 7.2 cm right ovarian dermoid, without significant change compared to ultrasound in 2021. Multiple small uterine fibroids, largest measuring 2.7 cm. Electronically Signed   By: Danae Orleans M.D.   On: 07/03/2023 09:53    FINAL MICROSCOPIC DIAGNOSIS:   A. ENDOMETRIUM, BIOPSY:  -  Mucus and scant fragments of superficial strips of atrophic appearing  endometrium in the background of numerous strips/fragments of  unremarkable ecto and endocervix.  -  Negative for atypia/hyperplasia/dysplasia on limited material.      Assessment & Plan:   1. Dermoid cyst of right ovary Reviewed that MRI consistent with dermoid and size stable from 2021. Note that most dermoid cysts are benign but there is an elevated risk of malignant transformation in postmenopausal patients.  - Ambulatory referral to Gynecologic Oncology  2. Elevated tumor markers Previously had a normal CA-125, had a ROMA drawn and it was elevated. Given the presence of dermoid in postmenopausal patient and elevated ROMA, recommend discussion with gynecology oncology. Noted that there is still possibility that cyst is benign but no way of knowing without removal.  - Ambulatory referral to Gynecologic Oncology  Routine  preventative health maintenance measures emphasized.  Lorriane Shire, MD Minimally Invasive Gynecologic Surgery [facility] for [facility], [facility]

## 2023-07-13 ENCOUNTER — Telehealth: Payer: Self-pay | Admitting: *Deleted

## 2023-07-13 ENCOUNTER — Ambulatory Visit: Payer: Medicare HMO | Admitting: Podiatry

## 2023-07-13 NOTE — Telephone Encounter (Signed)
Spoke with Alexandra Campos regarding her referral to GYN oncology. She has an appointment scheduled with Dr. Alvester Morin on Monday November 11 th at 1030. Patient agrees to date and time. She has been provided with office address and location. She is also aware of our mask and visitor policy. Patient verbalized understanding and will call with any questions.

## 2023-07-16 ENCOUNTER — Other Ambulatory Visit: Payer: Self-pay | Admitting: Student

## 2023-07-16 ENCOUNTER — Other Ambulatory Visit: Payer: Medicare HMO

## 2023-07-18 ENCOUNTER — Other Ambulatory Visit: Payer: Self-pay | Admitting: Student

## 2023-07-26 ENCOUNTER — Inpatient Hospital Stay (HOSPITAL_BASED_OUTPATIENT_CLINIC_OR_DEPARTMENT_OTHER): Payer: Medicare HMO | Admitting: Gynecologic Oncology

## 2023-07-26 ENCOUNTER — Encounter: Payer: Self-pay | Admitting: Psychiatry

## 2023-07-26 ENCOUNTER — Telehealth: Payer: Self-pay | Admitting: Student

## 2023-07-26 ENCOUNTER — Inpatient Hospital Stay: Payer: Medicare HMO | Attending: Psychiatry | Admitting: Psychiatry

## 2023-07-26 VITALS — BP 131/77 | HR 91 | Temp 98.1°F | Resp 20 | Ht 60.0 in | Wt 195.2 lb

## 2023-07-26 DIAGNOSIS — N83201 Unspecified ovarian cyst, right side: Secondary | ICD-10-CM

## 2023-07-26 DIAGNOSIS — Z791 Long term (current) use of non-steroidal anti-inflammatories (NSAID): Secondary | ICD-10-CM | POA: Insufficient documentation

## 2023-07-26 DIAGNOSIS — Z7985 Long-term (current) use of injectable non-insulin antidiabetic drugs: Secondary | ICD-10-CM | POA: Insufficient documentation

## 2023-07-26 DIAGNOSIS — E1122 Type 2 diabetes mellitus with diabetic chronic kidney disease: Secondary | ICD-10-CM | POA: Insufficient documentation

## 2023-07-26 DIAGNOSIS — I129 Hypertensive chronic kidney disease with stage 1 through stage 4 chronic kidney disease, or unspecified chronic kidney disease: Secondary | ICD-10-CM | POA: Diagnosis not present

## 2023-07-26 DIAGNOSIS — E785 Hyperlipidemia, unspecified: Secondary | ICD-10-CM | POA: Insufficient documentation

## 2023-07-26 DIAGNOSIS — R0602 Shortness of breath: Secondary | ICD-10-CM | POA: Diagnosis not present

## 2023-07-26 DIAGNOSIS — F17210 Nicotine dependence, cigarettes, uncomplicated: Secondary | ICD-10-CM | POA: Insufficient documentation

## 2023-07-26 DIAGNOSIS — R978 Other abnormal tumor markers: Secondary | ICD-10-CM | POA: Diagnosis not present

## 2023-07-26 DIAGNOSIS — Z809 Family history of malignant neoplasm, unspecified: Secondary | ICD-10-CM | POA: Diagnosis not present

## 2023-07-26 DIAGNOSIS — K219 Gastro-esophageal reflux disease without esophagitis: Secondary | ICD-10-CM | POA: Insufficient documentation

## 2023-07-26 DIAGNOSIS — Z79899 Other long term (current) drug therapy: Secondary | ICD-10-CM | POA: Insufficient documentation

## 2023-07-26 DIAGNOSIS — Z7984 Long term (current) use of oral hypoglycemic drugs: Secondary | ICD-10-CM | POA: Diagnosis not present

## 2023-07-26 DIAGNOSIS — N950 Postmenopausal bleeding: Secondary | ICD-10-CM | POA: Diagnosis not present

## 2023-07-26 DIAGNOSIS — E1151 Type 2 diabetes mellitus with diabetic peripheral angiopathy without gangrene: Secondary | ICD-10-CM | POA: Insufficient documentation

## 2023-07-26 DIAGNOSIS — J449 Chronic obstructive pulmonary disease, unspecified: Secondary | ICD-10-CM | POA: Diagnosis not present

## 2023-07-26 DIAGNOSIS — N189 Chronic kidney disease, unspecified: Secondary | ICD-10-CM | POA: Diagnosis not present

## 2023-07-26 DIAGNOSIS — N183 Chronic kidney disease, stage 3 unspecified: Secondary | ICD-10-CM

## 2023-07-26 DIAGNOSIS — F17200 Nicotine dependence, unspecified, uncomplicated: Secondary | ICD-10-CM

## 2023-07-26 DIAGNOSIS — Z800 Family history of malignant neoplasm of digestive organs: Secondary | ICD-10-CM | POA: Insufficient documentation

## 2023-07-26 MED ORDER — OXYCODONE HCL 5 MG PO TABS
5.0000 mg | ORAL_TABLET | Freq: Four times a day (QID) | ORAL | 0 refills | Status: DC | PRN
Start: 2023-07-26 — End: 2023-09-03

## 2023-07-26 MED ORDER — SENNOSIDES-DOCUSATE SODIUM 8.6-50 MG PO TABS
2.0000 | ORAL_TABLET | Freq: Every day | ORAL | 0 refills | Status: DC
Start: 2023-07-26 — End: 2023-10-01

## 2023-07-26 NOTE — Progress Notes (Signed)
GYNECOLOGIC ONCOLOGY NEW PATIENT CONSULTATION  Date of Service: 07/26/2023 Referring Provider: Lorriane Shire, MD   ASSESSMENT AND PLAN: Alexandra Campos is a 67 y.o. woman with 7cm adnexal mass, likely dermoid, with elevated HE4 and high risk postmenopausal ROMA.  We reviewed that the exact etiology of the pelvic mass is unclear, but could include a benign, borderline, or malignant process.  We reviewed that dermoid cyst are most commonly benign.  However, some can have immature components or malignant transformation.  Given her elevated HE4 and high risk postmenopausal ROMA, it may be reasonable to consider surgical removal for definitive diagnosis.  However, patient does have medical comorbidities including diabetes and peripheral arterial disease which may make her at increased risk of a complication from surgery.  Her A1c was most recently elevated at 8.7.  Additionally patient is a chronic smoker at half pack per day.  She reports shortness of breath with climbing a flight of stairs.  Would recommend clearance with her PCP prior to surgery.  Reviewed with patient that she may require additional cardiac clearance pending evaluation with her PCP.  Because the mass is relatively small, we feel that a minimally invasive approach is feasible, using robotic assistance.    We discussed the pros and cons of hysterectomy. Given her diabetes, vascular disease, and smoking history, she is more prone to infection and wound opening/complication. Given this, we discussed performing hysterectomy only in the setting of cancer if found on frozen. We discussed that even though her endometrial biopsy path was benign, it was superficial, so we could perform a D&C concurrently if leaving the uterus in situ to ensure no other pathology. Will not necessarily plan to send curettings for frozen, so pt aware that  a second surgery is possible if needed down the road.  In the event of malignancy or borderline tumor on  frozen section, we will perform indicated staging procedures. We discussed that these procedures may include omentectomy pelvic and/or para-aortic lymphadenectomy, peritoneal biopsies. We would also remove any tissue concerning for metastatic disease which could require additional procedures including bowel surgery.   Patient was consented for: pelvic exam under anesthesia, robotic assisted bilateral salpingo-oophorectomy, hysteroscopy dilation and curettage, possible hysterectomy, possible staging on 08/31/23.  The risks of surgery were discussed in detail and she understands these to including but not limited to bleeding requiring a blood transfusion, infection, injury to adjacent organs (including but not limited to the bowels, bladder, ureters, nerves, blood vessels), thromboembolic events, wound separation, hernia, vaginal cuff separation, possible risk of lymphedema and lymphocyst if lymphadenectomy performed, unforseen complication, possible need for re-exploration, and medical complications such as heart attack, stroke, pneumonia.  If the patient experiences any of these events, she understands that her hospitalization or recovery may be prolonged and that she may need to take additional medications for a prolonged period. The patient will receive DVT and antibiotic prophylaxis as indicated. She voiced a clear understanding. She had the opportunity to ask questions and informed consent was obtained today. She wishes to proceed.  Patient does require clearance given report SOB with climbing a flight of stairs.  All preoperative instructions were reviewed. Postoperative expectations were also reviewed. Written handouts were provided to the patient.   A copy of this note was sent to the patient's referring provider.  Clide Cliff, MD Gynecologic Oncology   Medical Decision Making I personally spent  TOTAL 60 minutes face-to-face and non-face-to-face in the care of this patient, which  includes all pre, intra, and  post visit time on the date of service.   ------------  CC: Adnexal mass, elevated ROMA  HISTORY OF PRESENT ILLNESS:  Alexandra Campos is a 67 y.o. woman who is seen in consultation at the request of Lorriane Shire, MD for evaluation of ovarian mass, elevated ROMA.  Patient presented to her OB/GYN on 06/03/2023 for postmenopausal bleeding.  She was seen previously in 2021 for similar findings with a normal CA125 and negative endometrial biopsy.  Prior to this, she had undergone a pelvic ultrasound which showed endometrial stripe of 4 mm, uterine fibroids, and right ovarian cyst with a solid nodule.  The cyst measures 7.5 x 6.3 x 7.2 cm with a solid nodule measuring 3.2 x 1.9 cm.  On return on 930/24 she underwent an endometrial biopsy which returned with superficial strips of atrophic appearing endometrium, no atypia, hyperplasia, or dysplasia.  She also had a Pap smear which was NILM, HPV high-risk negative.  Tumor markers were collected which showed a normal CA125 19.4, and elevated HE4 of 157 and an elevated postmenopausal ROMA of 3.41.  She then underwent an MRI pelvis on 06/22/2023 which showed a 7.2 cm right ovarian dermoid without significant change compared to ultrasound in 2021.  Today patient presents with her friend.  She endorses the history as above.  She otherwise denies abdominal bloating, significant weight loss, change in bowel or bladder habits.  She does note early satiety which she attributes to her Ozempic.  Patient follows with Dr. Melissa Noon for her primary care.  She also follows with [facility] for her peripheral arterial disease.  She previously was seeing Dr. Waverly Ferrari but he has retired.  Patient reports no change in symptoms since her last follow-up on 04/08/2023.  At that time plan was for 11-month follow-up and repeat ABIs.  Patient does note that she does have to stop when walking up a flight of stairs for  shortness of breath.    PAST MEDICAL HISTORY: Past Medical History:  Diagnosis Date   Abnormal facial hair 11/24/2019   Arthritis    Chronic kidney disease    Complex cyst of right ovary 04/09/2020   Likely a dermoid. Normal CA 125     COPD (chronic obstructive pulmonary disease) (HCC)    Diabetes mellitus without complication (HCC)    GERD (gastroesophageal reflux disease)    Hyperlipidemia    Hypertension    Ovarian cyst 03/09/2020   CA-125 17.3     Peripheral vascular disease (HCC)    Postmenopausal bleeding 02/07/2020   Rash 04/20/2022   Right upper quadrant pain 01/21/2022   Wheezing 12/13/2020   Yeast infection involving the vagina and surrounding area 06/10/2020    PAST SURGICAL HISTORY: Past Surgical History:  Procedure Laterality Date   ABDOMINAL AORTOGRAM W/LOWER EXTREMITY N/A 03/15/2020   Procedure: ABDOMINAL AORTOGRAM W/LOWER EXTREMITY;  Surgeon: Chuck Hint, MD;  Location: [facility] INVASIVE CV LAB;  Service: Cardiovascular;  Laterality: N/A;   ANKLE SURGERY Right    CARPAL TUNNEL RELEASE Right 11/18/2022   Procedure: RIGHT CARPAL TUNNEL RELEASE;  Surgeon: Tarry Kos, MD;  Location: [facility];  Service: Orthopedics;  Laterality: Right;   THROAT SURGERY     Reports polyps removed from throat   TUBAL LIGATION Bilateral     OB/GYN HISTORY: OB History  Gravida Para Term Preterm AB Living  3 2 2   1     SAB IAB Ectopic Multiple Live Births  1  2    # Outcome Date GA Lbr Len/2nd Weight Sex Type Anes PTL Lv  3 SAB           2 Term      Vag-Spont     1 Term      Vag-Spont         Age at menarche: 5 Age at menopause: ~52 Hx of HRT: no Hx of STI: no Last pap: 06/04/23 NILM, HPV HR negative History of abnormal pap smears: no  SCREENING STUDIES:  Last mammogram: 2024 Last colonoscopy: 2020  MEDICATIONS:  Current Outpatient Medications:    Accu-Chek Softclix Lancets lancets, Please use to check blood sugar up to twice  daily., Disp: 100 each, Rfl: 12   Acetaminophen (TYLENOL PO), Take by mouth., Disp: , Rfl:    albuterol (VENTOLIN HFA) 108 (90 Base) MCG/ACT inhaler, INHALE 2 PUFFS BY MOUTH EVERY 4 HOURS AS NEEDED FOR WHEEZING FOR SHORTNESS OF BREATH, Disp: 18 g, Rfl: 0   atorvastatin (LIPITOR) 40 MG tablet, Take 1 tablet by mouth once daily, Disp: 90 tablet, Rfl: 0   blood glucose meter kit and supplies KIT, 1 each by Does not apply route daily. Dispense based on patient and insurance preference. Use up to four times daily as directed. (FOR ICD-9 250.00, 250.01)., Disp: 1 each, Rfl: 0   cetirizine (ZYRTEC) 10 MG tablet, Take 1 tablet (10 mg total) by mouth daily., Disp: 30 tablet, Rfl: 0   chlorthalidone (HYGROTON) 25 MG tablet, Take 1 tablet (25 mg total) by mouth daily., Disp: 90 tablet, Rfl: 3   diclofenac Sodium (VOLTAREN) 1 % GEL, Apply 2 g topically 4 (four) times daily., Disp: 100 g, Rfl: 0   empagliflozin (JARDIANCE) 25 MG TABS tablet, Take 1 tablet (25 mg total) by mouth daily., Disp: 90 tablet, Rfl: 3   fluticasone-salmeterol (WIXELA INHUB) 250-50 MCG/ACT AEPB, Inhale 1 puff into the lungs in the morning and at bedtime., Disp: 60 each, Rfl: 5   glucose blood (ACCU-CHEK GUIDE) test strip, USE 1 STRIP TWICE DAILY FOR BLOOD SUGAR. E11.9, Disp: 100 each, Rfl: 1   Lancets Thin MISC, 1 each by Does not apply route as needed., Disp: 100 each, Rfl: 0   losartan (COZAAR) 100 MG tablet, Take 1 tablet (100 mg total) by mouth at bedtime., Disp: 90 tablet, Rfl: 3   metFORMIN (GLUCOPHAGE-XR) 500 MG 24 hr tablet, Take 2 tablets (1,000 mg total) by mouth in the morning and at bedtime., Disp: 360 tablet, Rfl: 3   Multiple Vitamin (MULTIVITAMIN PO), Take by mouth., Disp: , Rfl:    omeprazole (PRILOSEC) 20 MG capsule, Take 1 capsule (20 mg total) by mouth daily., Disp: 90 capsule, Rfl: 1   SPIRIVA RESPIMAT 2.5 MCG/ACT AERS, INHALE 2 SPRAY(S) BY MOUTH ONCE DAILY, Disp: 4 g, Rfl: 0   Turmeric (QC TUMERIC COMPLEX PO), Take by  mouth., Disp: , Rfl:    benzocaine (ORAJEL) 10 % mucosal gel, Use as directed 1 application in the mouth or throat as needed for mouth pain. (Patient not taking: Reported on 06/03/2023), Disp: 5.3 g, Rfl: 0   nystatin cream (MYCOSTATIN), APPLY 1 APPLICATION TOPICALLY TO RASH 4 TIMES DAILY FOR 14 DAYS, Disp: 30 g, Rfl: 0   OZEMPIC, 0.25 OR 0.5 MG/DOSE, 2 MG/3ML SOPN, INJECT 0.5 MG INTO THE SKIN ONCE A WEEK, Disp: 3 mL, Rfl: 0  ALLERGIES: No Known Allergies  FAMILY HISTORY: Family History  Problem Relation Age of Onset   Stomach cancer Mother  Diabetes Mother    Hypertension Mother    Dementia Mother    Diabetes Father    Hypertension Father    Leukemia Father    Diabetes Sister    Diabetes Sister    Diabetes Brother    Hypertension Brother    Colon cancer Brother 50   Diabetes Brother    Diabetes Maternal Grandmother    Hypertension Maternal Grandmother    Diabetes Daughter    Hypertension Daughter    Diabetes Son    Cancer Paternal Uncle        unknown cancer   Colon polyps Neg Hx    Esophageal cancer Neg Hx    Rectal cancer Neg Hx    Breast cancer Neg Hx    Ovarian cancer Neg Hx    Endometrial cancer Neg Hx     SOCIAL HISTORY: Social History   Socioeconomic History   Marital status: Single    Spouse name: Not on file   Number of children: 1   Years of education: 12   Highest education level: GED or equivalent  Occupational History   Occupation: CNA    Comment: Retired  Tobacco Use   Smoking status: Every Day    Current packs/day: 0.50    Average packs/day: 0.5 packs/day for 52.9 years (26.4 ttl pk-yrs)    Types: Cigarettes    Start date: 1972    Passive exposure: Current   Smokeless tobacco: Never  Vaping Use   Vaping status: Never Used  Substance and Sexual Activity   Alcohol use: Yes    Alcohol/week: 5.0 standard drinks of alcohol    Types: 5 Cans of beer per week    Comment: 5 drinks/week    Drug use: Yes    Types: Marijuana    Comment: last  smoked cig today, last marijuana 11/16/2022   Sexual activity: Not Currently  Other Topics Concern   Not on file  Social History Narrative   Patient lives alone in an independent living facility.    Patient walks the parameter daily ~10 minutes.    Patient enjoys seeing her neighbors.    Patient has one son and one daughter who passed away.    Social Determinants of Health   Financial Resource Strain: Low Risk  (05/26/2023)   Overall Financial Resource Strain (CARDIA)    Difficulty of Paying Living Expenses: Not very hard  Food Insecurity: Food Insecurity Present (07/12/2023)   Hunger Vital Sign    Worried About Running Out of Food in the Last Year: Sometimes true    Ran Out of Food in the Last Year: Sometimes true  Transportation Needs: No Transportation Needs (07/12/2023)   PRAPARE - Administrator, Civil Service (Medical): No    Lack of Transportation (Non-Medical): No  Recent Concern: Transportation Needs - Unmet Transportation Needs (06/14/2023)   PRAPARE - Transportation    Lack of Transportation (Medical): Yes    Lack of Transportation (Non-Medical): Yes  Physical Activity: Sufficiently Active (05/26/2023)   Exercise Vital Sign    Days of Exercise per Week: 6 days    Minutes of Exercise per Session: 30 min  Stress: Stress Concern Present (05/26/2023)   [employer] of Occupational Health - Occupational Stress Questionnaire    Feeling of Stress : To some extent  Social Connections: Moderately Integrated (05/26/2023)   Social Connection and Isolation Panel [NHANES]    Frequency of Communication with Friends and Family: More than three times a week    Frequency  of Social Gatherings with Friends and Family: Twice a week    Attends Religious Services: More than 4 times per year    Active Member of [employer] or Organizations: No    Attends Banker Meetings: 1 to 4 times per year    Marital Status: Never married  Intimate Partner Violence: Not At Risk  (12/24/2022)   Humiliation, Afraid, Rape, and Kick questionnaire    Fear of Current or Ex-Partner: No    Emotionally Abused: No    Physically Abused: No    Sexually Abused: No    REVIEW OF SYSTEMS: New patient intake form was reviewed.  Complete 10-system review is negative except for the following: Joint pain, anxiety, incontinence, cough, slight pain with urination, hot flashes, itching, problem walking  PHYSICAL EXAM: BP 131/77 (BP Location: Left Arm, Patient Position: Sitting)   Pulse 91   Temp 98.1 F (36.7 C) (Oral)   Resp 20   Ht 5' (1.524 m)   Wt 195 lb 3.2 oz (88.5 kg)   SpO2 99%   BMI 38.12 kg/m  Constitutional: No acute distress. Neuro/Psych: Alert, oriented.  Head and Neck: Normocephalic, atraumatic. Neck symmetric without masses. Sclera anicteric.  Respiratory: Normal work of breathing. Clear to auscultation bilaterally. Cardiovascular: Regular rate and rhythm, no murmurs, rubs, or gallops. Abdomen: Normoactive bowel sounds. Soft, non-distended, non-tender to palpation. No masses appreciated. Well healed umbilical incision. Extremities: Grossly normal range of motion. Warm, well perfused. No edema bilaterally. Skin: No rashes or lesions. Lymphatic: No cervical, supraclavicular, or inguinal adenopathy. Genitourinary: External genitalia without lesions. Urethral meatus without lesions or prolapse. On speculum exam, vagina and cervix without lesions. Bimanual exam reveals normal cevix, irregular shaped uterus, right adnexal mass with mild TTP, somewhat mobile.  Exam chaperoned by [name], NP   LABORATORY AND RADIOLOGIC DATA: Outside medical records were reviewed to synthesize the above history, along with the history and physical obtained during the visit.  Outside laboratory, pathology, and imaging reports were reviewed, with pertinent results below.  I personally reviewed the outside images.  WBC  Date Value Ref Range Status  02/22/2023 5.3 3.4 - 10.8 x10E3/uL  Final  11/22/2017 6.1 3.6 - 11.0 K/uL Final   Hemoglobin  Date Value Ref Range Status  02/22/2023 10.5 (L) 11.1 - 15.9 g/dL Final   Hematocrit  Date Value Ref Range Status  02/22/2023 32.7 (L) 34.0 - 46.6 % Final   Platelets  Date Value Ref Range Status  02/22/2023 255 150 - 450 x10E3/uL Final   Creatinine  Date Value Ref Range Status  12/12/2012 0.71 0.60 - 1.30 mg/dL Final   Creatinine, Ser  Date Value Ref Range Status  02/22/2023 1.08 (H) 0.57 - 1.00 mg/dL Final   AST  Date Value Ref Range Status  01/20/2022 9 0 - 40 IU/L Final   SGOT(AST)  Date Value Ref Range Status  12/12/2012 16 15 - 37 Unit/L Final   ALT  Date Value Ref Range Status  01/20/2022 12 0 - 32 IU/L Final   SGPT (ALT)  Date Value Ref Range Status  12/12/2012 18 12 - 78 U/L Final   Cancer Antigen (CA) 125  Date Value Ref Range Status  06/14/2023 19.4 0.0 - 38.1 U/mL Final    Comment:    [employer] Electrochemiluminescence Immunoassay (ECLIA) Values obtained with different assay methods or kits cannot be used interchangeably.  Results cannot be interpreted as absolute evidence of the presence or absence of malignant disease.   03/08/2020 17.3  0.0 - 38.1 U/mL Final    Comment:    [employer] Electrochemiluminescence Immunoassay (ECLIA) Values obtained with different assay methods or kits cannot be used interchangeably.  Results cannot be interpreted as absolute evidence of the presence or absence of malignant disease.    Diagnosis  Date Value Ref Range Status  06/14/2023   Final   - Negative for intraepithelial lesion or malignancy (NILM)  11/09/2019   Final   - Negative for intraepithelial lesion or malignancy (NILM)   Postmenopausal ROMA - 3.41 (High risk)  A1c (02/22/23): 8.7   Surgical pathology (06/14/23): FINAL MICROSCOPIC DIAGNOSIS:   A. ENDOMETRIUM, BIOPSY:  -  Mucus and scant fragments of superficial strips of atrophic appearing  endometrium in the background  of numerous strips/fragments of  unremarkable ecto and endocervix.  -  Negative for atypia/hyperplasia/dysplasia on limited material.   MR PELVIS W WO CONTRAST 06/22/2023  Narrative CLINICAL DATA:  Right ovarian mass on recent ultrasound.  EXAM: MRI PELVIS WITHOUT AND WITH CONTRAST  TECHNIQUE: Multiplanar multisequence MR imaging of the pelvis was performed both before and after administration of intravenous contrast.  CONTRAST:  9mL GADAVIST GADOBUTROL 1 MMOL/ML IV SOLN  COMPARISON:  Ultrasound on 02/24/2023 and 02/29/2020  FINDINGS: Lower Urinary Tract: No urinary bladder or urethral abnormality identified.  Bowel: Unremarkable pelvic bowel loops.  Vascular/Lymphatic: Unremarkable. No pathologically enlarged pelvic lymph nodes identified.  Reproductive:  -- Uterus: Measures 6.7 x 3.4 x 4.6 cm (volume = 55 cm^3). Multiple intramural and subserosal fibroids are seen, largest in the anterior uterine corpus measuring 2.7 x 2.6 x 2.5 cm. No abnormal endometrial thickening demonstrated. Cervix and vagina are unremarkable.  -- Right ovary: A cystic and solid right ovarian mass is seen, which measures 7.2 x 5.9 x 7.1 cm. This mass shows prominent internal soft tissue nodule which contains fat this lesion shows no significant change in size since earlier ultrasound on 2021. This is consistent with ovarian dermoid.  -- Left ovary:  Not visualized, however no adnexal mass identified.  Other: No peritoneal thickening or abnormal free fluid.  Musculoskeletal:  Unremarkable.  IMPRESSION: 7.2 cm right ovarian dermoid, without significant change compared to ultrasound in 2021.  Multiple small uterine fibroids, largest measuring 2.7 cm.   Electronically Signed By: Danae Orleans M.D. On: 07/03/2023 09:53   US Pelvic Complete With Transvaginal 02/24/2023  Narrative CLINICAL DATA:  Postmenopausal vaginal bleeding for 3 months.  EXAM: TRANSABDOMINAL AND TRANSVAGINAL  ULTRASOUND OF PELVIS  TECHNIQUE: Both transabdominal and transvaginal ultrasound examinations of the pelvis were performed. Transabdominal technique was performed for global imaging of the pelvis including uterus, ovaries, adnexal regions, and pelvic cul-de-sac. It was necessary to proceed with endovaginal exam following the transabdominal exam to visualize the uterus.  COMPARISON:  Pelvic ultrasound dated 02/29/2020.  FINDINGS: Uterus  Measurements: 9.4 x 4.3 x 4.9 cm. Multiple uterine masses are most consistent with uterine fibroids. These measure 3.2 x 3.4 x 2.2 cm in the right uterine body (possible subserosal location), 2.2 x 1.8 x 2.4 cm in the left uterine body (intramural location), and 2.5 x 2.3 x 2.4 cm in the left uterine fundus (possible subserosal location).  Endometrium  Thickness: 4 mm.  No focal abnormality visualized.  Right ovary  Measurements: 9.5 x 6.1 x 7.7 cm = volume: 234 mL. There is a right ovarian cyst with a solid nodule without color flow. The cyst measures 7.5 x 6.3 x 7.2 cm and the apparent solid nodule measures 3.2 x 1.9  cm. This finding appears similar to 02/29/2020 given differences in technique.  Left ovary  Not visualized.  Other findings  No abnormal free fluid.  IMPRESSION: 1. Right ovarian cyst with a solid nodule. While this appears unchanged since 02/19/2020 given differences in technique and may represent a benign lesion such as a dermoid or cystadenofibroma, surgical evaluation or MRI pelvis should be considered. 2. Multiple uterine fibroids.   Electronically Signed By: Romona Curls M.D. On: 02/26/2023 12:15

## 2023-07-26 NOTE — Patient Instructions (Addendum)
Preparing for your Surgery  Plan for surgery on August 31, 2023 with Dr. Clide Cliff at [facility]. You will be scheduled for robotic assisted laparoscopic bilateral salpingo-oophorectomy (removal of both ovaries and fallopian tubes), hysteroscopy with dilation and curettage of the uterus (dilating the cervix and looking into the uterus with a camera and taking a sample from the lining), possible total hysterectomy (removal of the uterus and cervix), possible staging if a precancer or cancer is seen.  You will need to hold you ozempic for 7 days before surgery and your jardiance for 3 days before surgery.  Pre-operative Testing -You will receive a phone call from presurgical testing at [facility] to arrange for a pre-operative appointment and lab work.  -Bring your insurance card, copy of an advanced directive if applicable, medication list  -At that visit, you will be asked to sign a consent for a possible blood transfusion in case a transfusion becomes necessary during surgery.  The need for a blood transfusion is rare but having consent is a necessary part of your care.     -You should not be taking blood thinners or aspirin at least ten days prior to surgery unless instructed by your surgeon.  -Do not take supplements such as fish oil (omega 3), red yeast rice, turmeric before your surgery. You want to avoid medications with aspirin in them including headache powders such as BC or Goody's), Excedrin migraine. STOP TAKING TUMERIC AT LEAST 10 DAYS BEFORE SURGERY SINCE THIS CAN THIN YOUR BLOOD.  Day Before Surgery at Home -You will be asked to take in a light diet the day before surgery. You will be advised you can have clear liquids up until 3 hours before your surgery.    Eat a light diet the day before surgery.  Examples including soups, broths, toast, yogurt, mashed potatoes.  AVOID GAS PRODUCING FOODS AND BEVERAGES. Things to avoid include carbonated beverages  (fizzy beverages, sodas), raw fruits and raw vegetables (uncooked), or beans.   If your bowels are filled with gas, your surgeon will have difficulty visualizing your pelvic organs which increases your surgical risks.  Your role in recovery Your role is to become active as soon as directed by your doctor, while still giving yourself time to heal.  Rest when you feel tired. You will be asked to do the following in order to speed your recovery:  - Cough and breathe deeply. This helps to clear and expand your lungs and can prevent pneumonia after surgery.  - STAY ACTIVE WHEN YOU GET HOME. Do mild physical activity. Walking or moving your legs help your circulation and body functions return to normal. Do not try to get up or walk alone the first time after surgery.   -If you develop swelling on one leg or the other, pain in the back of your leg, redness/warmth in one of your legs, please call the office or go to the Emergency Room to have a doppler to rule out a blood clot. For shortness of breath, chest pain-seek care in the Emergency Room as soon as possible. - Actively manage your pain. Managing your pain lets you move in comfort. We will ask you to rate your pain on a scale of zero to 10. It is your responsibility to tell your doctor or nurse where and how much you hurt so your pain can be treated.  Special Considerations -If you are diabetic, you may be placed on insulin after surgery to have closer control  over your blood sugars to promote healing and recovery.  This does not mean that you will be discharged on insulin.  If applicable, your oral antidiabetics will be resumed when you are tolerating a solid diet.  -Your final pathology results from surgery should be available around one week after surgery and the results will be relayed to you when available.  -Dr. Antionette Char is the surgeon that assists your GYN Oncologist with surgery.  If you end up staying the night, the next day after  your surgery you will either see Dr. Pricilla Holm, Dr. Alvester Morin, or Dr. Antionette Char.  -FMLA forms can be faxed to [phone] and please allow 5-7 business days for completion.  Pain Management After Surgery -You will be prescribed your pain medication and bowel regimen medications before surgery so that you can have these available when you are discharged from the hospital. The pain medication is for use ONLY AFTER surgery and a new prescription will not be given.   -Make sure that you have Tylenol IF YOU ARE ABLE TO TAKE THESE MEDICATION at home to use on a regular basis after surgery for pain control.   -Review the attached handout on narcotic use and their risks and side effects.   Bowel Regimen -You will be prescribed Sennakot-S to take nightly to prevent constipation especially if you are taking the narcotic pain medication intermittently.  It is important to prevent constipation and drink adequate amounts of liquids. You can stop taking this medication when you are not taking pain medication and you are back on your normal bowel routine.  Risks of Surgery Risks of surgery are low but include bleeding, infection, damage to surrounding structures, re-operation, blood clots, and very rarely death.   Blood Transfusion Information (For the consent to be signed before surgery)  We will be checking your blood type before surgery so in case of emergencies, we will know what type of blood you would need.                                            WHAT IS A BLOOD TRANSFUSION?  A transfusion is the replacement of blood or some of its parts. Blood is made up of multiple cells which provide different functions. Red blood cells carry oxygen and are used for blood loss replacement. White blood cells fight against infection. Platelets control bleeding. Plasma helps clot blood. Other blood products are available for specialized needs, such as hemophilia or other clotting disorders. BEFORE THE  TRANSFUSION  Who gives blood for transfusions?  You may be able to donate blood to be used at a later date on yourself (autologous donation). Relatives can be asked to donate blood. This is generally not any safer than if you have received blood from a stranger. The same precautions are taken to ensure safety when a relative's blood is donated. Healthy volunteers who are fully evaluated to make sure their blood is safe. This is blood bank blood. Transfusion therapy is the safest it has ever been in the practice of medicine. Before blood is taken from a donor, a complete history is taken to make sure that person has no history of diseases nor engages in risky social behavior (examples are intravenous drug use or sexual activity with multiple partners). The donor's travel history is screened to minimize risk of transmitting infections, such as malaria. The donated blood  is tested for signs of infectious diseases, such as HIV and hepatitis. The blood is then tested to be sure it is compatible with you in order to minimize the chance of a transfusion reaction. If you or a relative donates blood, this is often done in anticipation of surgery and is not appropriate for emergency situations. It takes many days to process the donated blood. RISKS AND COMPLICATIONS Although transfusion therapy is very safe and saves many lives, the main dangers of transfusion include:  Getting an infectious disease. Developing a transfusion reaction. This is an allergic reaction to something in the blood you were given. Every precaution is taken to prevent this. The decision to have a blood transfusion has been considered carefully by your caregiver before blood is given. Blood is not given unless the benefits outweigh the risks.  AFTER SURGERY INSTRUCTIONS  Return to work: 4-6 weeks if applicable  Activity: 1. Be up and out of the bed during the day.  Take a nap if needed.  You may walk up steps but be careful and use the  hand rail.  Stair climbing will tire you more than you think, you may need to stop part way and rest.   2. No lifting or straining for 6 weeks over 10 pounds. No pushing, pulling, straining for 6 weeks.  3. No driving for around 1-61 days when the following have been met: Do not drive if you are taking narcotic pain medicine and make sure that your reaction time has returned.   4. You can shower as soon as the next day after surgery. Shower daily.  Use your regular soap and water (not directly on the incision) and pat your incision(s) dry afterwards; don't rub.  No tub baths or submerging your body in water until cleared by your surgeon. If you have the soap that was given to you by pre-surgical testing that was used before surgery, you do not need to use it afterwards because this can irritate your incisions.   5. No sexual activity and nothing in the vagina for 12 weeks.  6. You may experience a small amount of clear drainage from your incisions, which is normal.  If the drainage persists, increases, or changes color please call the office.  7. Do not use creams, lotions, or ointments such as neosporin on your incisions after surgery until advised by your surgeon because they can cause removal of the dermabond glue on your incisions.    8. You may experience vaginal spotting after surgery or when the stitches at the top of the vagina begin to dissolve.  The spotting is normal but if you experience heavy bleeding, call our office.  9. Take Tylenol first for pain if you are able to take these medication and only use narcotic pain medication for severe pain not relieved by the Tylenol.  Monitor your Tylenol intake to a max of 4,000 mg in a 24 hour period.  Diet: 1. Low sodium Heart Healthy Diet is recommended but you are cleared to resume your normal (before surgery) diet after your procedure.  2. It is safe to use a laxative, such as Miralax or Colace, if you have difficulty moving your bowels.  You have been prescribed Sennakot-S to take at bedtime every evening after surgery to keep bowel movements regular and to prevent constipation.    Wound Care: 1. Keep clean and dry.  Shower daily.  Reasons to call the Doctor: Fever - Oral temperature greater than 100.4 degrees Fahrenheit  Foul-smelling vaginal discharge Difficulty urinating Nausea and vomiting Increased pain at the site of the incision that is unrelieved with pain medicine. Difficulty breathing with or without chest pain New calf pain especially if only on one side Sudden, continuing increased vaginal bleeding with or without clots.   Contacts: For questions or concerns you should contact:  Dr. Clide Cliff at [phone]  [name], NP at [phone]  After Hours: call [phone] and have the GYN Oncologist paged/contacted (after 5 pm or on the weekends). You will speak with an after hours RN and let he or she know you have had surgery.  Messages sent via mychart are for non-urgent matters and are not responded to after hours so for urgent needs, please call the after hours number.

## 2023-07-26 NOTE — Patient Instructions (Signed)
Preparing for your Surgery  Plan for surgery on August 31, 2023 with Dr. Clide Cliff at [facility]. You will be scheduled for robotic assisted laparoscopic bilateral salpingo-oophorectomy (removal of both ovaries and fallopian tubes), hysteroscopy with dilation and curettage of the uterus (dilating the cervix and looking into the uterus with a camera and taking a sample from the lining), possible total hysterectomy (removal of the uterus and cervix), possible staging if a precancer or cancer is seen.  You will need to hold you ozempic for 7 days before surgery and your jardiance for 3 days before surgery.  Pre-operative Testing -You will receive a phone call from presurgical testing at [facility] to arrange for a pre-operative appointment and lab work.  -Bring your insurance card, copy of an advanced directive if applicable, medication list  -At that visit, you will be asked to sign a consent for a possible blood transfusion in case a transfusion becomes necessary during surgery.  The need for a blood transfusion is rare but having consent is a necessary part of your care.     -You should not be taking blood thinners or aspirin at least ten days prior to surgery unless instructed by your surgeon.  -Do not take supplements such as fish oil (omega 3), red yeast rice, turmeric before your surgery. You want to avoid medications with aspirin in them including headache powders such as BC or Goody's), Excedrin migraine. STOP TAKING TUMERIC AT LEAST 10 DAYS BEFORE SURGERY SINCE THIS CAN THIN YOUR BLOOD.  Day Before Surgery at Home -You will be asked to take in a light diet the day before surgery. You will be advised you can have clear liquids up until 3 hours before your surgery.    Eat a light diet the day before surgery.  Examples including soups, broths, toast, yogurt, mashed potatoes.  AVOID GAS PRODUCING FOODS AND BEVERAGES. Things to avoid include carbonated beverages  (fizzy beverages, sodas), raw fruits and raw vegetables (uncooked), or beans.   If your bowels are filled with gas, your surgeon will have difficulty visualizing your pelvic organs which increases your surgical risks.  Your role in recovery Your role is to become active as soon as directed by your doctor, while still giving yourself time to heal.  Rest when you feel tired. You will be asked to do the following in order to speed your recovery:  - Cough and breathe deeply. This helps to clear and expand your lungs and can prevent pneumonia after surgery.  - STAY ACTIVE WHEN YOU GET HOME. Do mild physical activity. Walking or moving your legs help your circulation and body functions return to normal. Do not try to get up or walk alone the first time after surgery.   -If you develop swelling on one leg or the other, pain in the back of your leg, redness/warmth in one of your legs, please call the office or go to the Emergency Room to have a doppler to rule out a blood clot. For shortness of breath, chest pain-seek care in the Emergency Room as soon as possible. - Actively manage your pain. Managing your pain lets you move in comfort. We will ask you to rate your pain on a scale of zero to 10. It is your responsibility to tell your doctor or nurse where and how much you hurt so your pain can be treated.  Special Considerations -If you are diabetic, you may be placed on insulin after surgery to have closer control  over your blood sugars to promote healing and recovery.  This does not mean that you will be discharged on insulin.  If applicable, your oral antidiabetics will be resumed when you are tolerating a solid diet.  -Your final pathology results from surgery should be available around one week after surgery and the results will be relayed to you when available.  -Dr. Antionette Char is the surgeon that assists your GYN Oncologist with surgery.  If you end up staying the night, the next day after  your surgery you will either see Dr. Pricilla Holm, Dr. Alvester Morin, or Dr. Antionette Char.  -FMLA forms can be faxed to [phone] and please allow 5-7 business days for completion.  Pain Management After Surgery -You will be prescribed your pain medication and bowel regimen medications before surgery so that you can have these available when you are discharged from the hospital. The pain medication is for use ONLY AFTER surgery and a new prescription will not be given.   -Make sure that you have Tylenol IF YOU ARE ABLE TO TAKE THESE MEDICATION at home to use on a regular basis after surgery for pain control.   -Review the attached handout on narcotic use and their risks and side effects.   Bowel Regimen -You will be prescribed Sennakot-S to take nightly to prevent constipation especially if you are taking the narcotic pain medication intermittently.  It is important to prevent constipation and drink adequate amounts of liquids. You can stop taking this medication when you are not taking pain medication and you are back on your normal bowel routine.  Risks of Surgery Risks of surgery are low but include bleeding, infection, damage to surrounding structures, re-operation, blood clots, and very rarely death.   Blood Transfusion Information (For the consent to be signed before surgery)  We will be checking your blood type before surgery so in case of emergencies, we will know what type of blood you would need.                                            WHAT IS A BLOOD TRANSFUSION?  A transfusion is the replacement of blood or some of its parts. Blood is made up of multiple cells which provide different functions. Red blood cells carry oxygen and are used for blood loss replacement. White blood cells fight against infection. Platelets control bleeding. Plasma helps clot blood. Other blood products are available for specialized needs, such as hemophilia or other clotting disorders. BEFORE THE  TRANSFUSION  Who gives blood for transfusions?  You may be able to donate blood to be used at a later date on yourself (autologous donation). Relatives can be asked to donate blood. This is generally not any safer than if you have received blood from a stranger. The same precautions are taken to ensure safety when a relative's blood is donated. Healthy volunteers who are fully evaluated to make sure their blood is safe. This is blood bank blood. Transfusion therapy is the safest it has ever been in the practice of medicine. Before blood is taken from a donor, a complete history is taken to make sure that person has no history of diseases nor engages in risky social behavior (examples are intravenous drug use or sexual activity with multiple partners). The donor's travel history is screened to minimize risk of transmitting infections, such as malaria. The donated blood  is tested for signs of infectious diseases, such as HIV and hepatitis. The blood is then tested to be sure it is compatible with you in order to minimize the chance of a transfusion reaction. If you or a relative donates blood, this is often done in anticipation of surgery and is not appropriate for emergency situations. It takes many days to process the donated blood. RISKS AND COMPLICATIONS Although transfusion therapy is very safe and saves many lives, the main dangers of transfusion include:  Getting an infectious disease. Developing a transfusion reaction. This is an allergic reaction to something in the blood you were given. Every precaution is taken to prevent this. The decision to have a blood transfusion has been considered carefully by your caregiver before blood is given. Blood is not given unless the benefits outweigh the risks.  AFTER SURGERY INSTRUCTIONS  Return to work: 4-6 weeks if applicable  Activity: 1. Be up and out of the bed during the day.  Take a nap if needed.  You may walk up steps but be careful and use the  hand rail.  Stair climbing will tire you more than you think, you may need to stop part way and rest.   2. No lifting or straining for 6 weeks over 10 pounds. No pushing, pulling, straining for 6 weeks.  3. No driving for around 1-61 days when the following have been met: Do not drive if you are taking narcotic pain medicine and make sure that your reaction time has returned.   4. You can shower as soon as the next day after surgery. Shower daily.  Use your regular soap and water (not directly on the incision) and pat your incision(s) dry afterwards; don't rub.  No tub baths or submerging your body in water until cleared by your surgeon. If you have the soap that was given to you by pre-surgical testing that was used before surgery, you do not need to use it afterwards because this can irritate your incisions.   5. No sexual activity and nothing in the vagina for 12 weeks.  6. You may experience a small amount of clear drainage from your incisions, which is normal.  If the drainage persists, increases, or changes color please call the office.  7. Do not use creams, lotions, or ointments such as neosporin on your incisions after surgery until advised by your surgeon because they can cause removal of the dermabond glue on your incisions.    8. You may experience vaginal spotting after surgery or when the stitches at the top of the vagina begin to dissolve.  The spotting is normal but if you experience heavy bleeding, call our office.  9. Take Tylenol first for pain if you are able to take these medication and only use narcotic pain medication for severe pain not relieved by the Tylenol.  Monitor your Tylenol intake to a max of 4,000 mg in a 24 hour period.  Diet: 1. Low sodium Heart Healthy Diet is recommended but you are cleared to resume your normal (before surgery) diet after your procedure.  2. It is safe to use a laxative, such as Miralax or Colace, if you have difficulty moving your bowels.  You have been prescribed Sennakot-S to take at bedtime every evening after surgery to keep bowel movements regular and to prevent constipation.    Wound Care: 1. Keep clean and dry.  Shower daily.  Reasons to call the Doctor: Fever - Oral temperature greater than 100.4 degrees Fahrenheit  Foul-smelling vaginal discharge Difficulty urinating Nausea and vomiting Increased pain at the site of the incision that is unrelieved with pain medicine. Difficulty breathing with or without chest pain New calf pain especially if only on one side Sudden, continuing increased vaginal bleeding with or without clots.   Contacts: For questions or concerns you should contact:  Dr. Clide Cliff at [phone]  [name], NP at [phone]  After Hours: call [phone] and have the GYN Oncologist paged/contacted (after 5 pm or on the weekends). You will speak with an after hours RN and let he or she know you have had surgery.  Messages sent via mychart are for non-urgent matters and are not responded to after hours so for urgent needs, please call the after hours number.

## 2023-07-26 NOTE — Telephone Encounter (Signed)
patient dropped off form at front desk for [employer].  Verified that patient section of form has been completed.  Last DOS/WCC with PCP was 05/31/23  Placed form in green team folder to be completed by clinical staff.  Alexandra Campos

## 2023-07-26 NOTE — Progress Notes (Signed)
Patient here for new patient consultation with Dr. Alvester Morin and for a pre-operative appointment prior to her scheduled surgery on 08/31/2023. She is scheduled for robotic assisted laparoscopic bilateral salpingo-oophorectomy (removal of both ovaries and fallopian tubes), hysteroscopy with dilation and curettage of the uterus, possible total hysterectomy , possible staging if a precancer or cancer is seen. The surgery was discussed in detail.  See after visit summary for additional details. Visual aids used to discuss items related to surgery.   Discussed post-op pain management in detail including the aspects of the enhanced recovery pathway.  Advised her that a new prescription would be sent in for oxycodone and it is only to be used for after her upcoming surgery.  We discussed the use of tylenol post-op and to monitor for a maximum of 4,000 mg in a 24 hour period.  Also prescribed sennakot to be used after surgery and to hold if having loose stools.  Discussed bowel regimen in detail.     Discussed measures to take at home to prevent DVT including frequent mobility.  Reportable signs and symptoms of DVT discussed. Post-operative instructions discussed and expectations for after surgery. Incisional care discussed as well including reportable signs and symptoms including erythema, drainage, wound separation.     10 minutes spent preparing information and with the patient.  Verbalizing understanding of material discussed. No needs or concerns voiced at the end of the visit.   Advised patient to call for any needs.  Advised that her post-operative medications had been prescribed and could be picked up at any time.    This appointment is included in the global surgical bundle as pre-operative teaching and has no charge.

## 2023-07-26 NOTE — Telephone Encounter (Signed)
Reviewed paperwork and placed in PCP's box for completion.  .Alexandra Campos, CMA' 

## 2023-07-27 ENCOUNTER — Telehealth: Payer: Self-pay | Admitting: *Deleted

## 2023-07-27 NOTE — Telephone Encounter (Signed)
Per Dr Alvester Morin fax surgical optimization form to the patient's PCP

## 2023-08-02 NOTE — Telephone Encounter (Signed)
   Completed patient's surgical optimization form- she is "above average risk" for severe outcomes. As seen above. Ideally, would have improved glycemic control, tobacco cessation.

## 2023-08-03 ENCOUNTER — Encounter: Payer: Self-pay | Admitting: Student

## 2023-08-03 ENCOUNTER — Ambulatory Visit (INDEPENDENT_AMBULATORY_CARE_PROVIDER_SITE_OTHER): Payer: Medicare HMO | Admitting: Student

## 2023-08-03 VITALS — BP 132/75 | HR 102 | Ht 60.0 in | Wt 196.0 lb

## 2023-08-03 DIAGNOSIS — J441 Chronic obstructive pulmonary disease with (acute) exacerbation: Secondary | ICD-10-CM | POA: Insufficient documentation

## 2023-08-03 DIAGNOSIS — R0600 Dyspnea, unspecified: Secondary | ICD-10-CM | POA: Diagnosis not present

## 2023-08-03 MED ORDER — MELATONIN 3 MG PO TABS: 3.0000 mg | ORAL_TABLET | Freq: Every day | ORAL | 0 refills | Status: DC

## 2023-08-03 MED ORDER — AZITHROMYCIN 500 MG PO TABS: 500.0000 mg | ORAL_TABLET | Freq: Every day | ORAL | 0 refills | Status: DC

## 2023-08-03 MED ORDER — PREDNISONE 20 MG PO TABS: 40.0000 mg | ORAL_TABLET | Freq: Every day | ORAL | 0 refills | Status: AC

## 2023-08-03 NOTE — Progress Notes (Signed)
    SUBJECTIVE:   CHIEF COMPLAINT / HPI:   Alexandra Campos is a 67 year-old female here to discuss dyspnea and cough.  Her mother passed 2 weeks ago, and so she has been smoking more lately. She had gotten down to half a pack day, but she is now smoking 1 pack per day.  Over the past week or so, reports shortness of breath, increased phlegm production and it has been a more white-ish color than her usual. She denies fevers or chills. She says the past few days she has only been able to walk about halfway around the building when she gets short of breath which is worse than usual for her.  She is on triple therapy-taking Spiriva Respimat and fluticasone-salmeterol daily.   PERTINENT  PMH / PSH: T2DM, hypertension, COPD  OBJECTIVE:   BP 132/75   Pulse (!) 102   Ht 5' (1.524 m)   Wt 196 lb (88.9 kg)   SpO2 99%   BMI 38.28 kg/m   General: Pleasant, ambulates with a cane, generally well-appearing HEENT: Mucous membranes moist.  No JVD Respiratory: Speaks in full sentences.  Has productive cough intermittently.  Lungs with diminished sounds at the bases, mild expiratory wheeze. Extremities: Warm and well-perfused, no peripheral edema   ASSESSMENT/PLAN:   COPD with acute exacerbation (HCC) Meets 3 cardinal symptoms of acute COPD exacerbation. Reassuringly, no labored breathing and she is on, room air with normal SpO2 99%. Plan to treat with prednisone 40 mg x 5 days, azithromycin 100 mg for 3 days Highly encouraged to reduce/cease tobacco use.  We did discuss that she is above average risk for her upcoming surgery with gynecology. Will have a preoperative visit in the next 1 to 2 weeks after treated for this acute exacerbation Return precautions and ER precautions discussed     Darral Dash, DO [facility]

## 2023-08-03 NOTE — Assessment & Plan Note (Signed)
Meets 3 cardinal symptoms of acute COPD exacerbation. Reassuringly, no labored breathing and she is on, room air with normal SpO2 99%. Plan to treat with prednisone 40 mg x 5 days, azithromycin 100 mg for 3 days Highly encouraged to reduce/cease tobacco use.  We did discuss that she is above average risk for her upcoming surgery with gynecology. Will have a preoperative visit in the next 1 to 2 weeks after treated for this acute exacerbation Return precautions and ER precautions discussed

## 2023-08-03 NOTE — Telephone Encounter (Signed)
Received clearance, form given to MD.

## 2023-08-03 NOTE — Patient Instructions (Addendum)
It was great seeing you today.  As we discussed, - Please go to [facility] to get your chest x-ray completed. - I prescribed an antibiotic (azithromycin) and a steroid (prednisone) for COPD exacerbation -If you develop shortness of breath at rest that is not getting better, or fevers or chills with a general feeling of unwellness, you need to seek more urgent care  Please return to see me the next week so I can make sure that you are doing okay. Will also discuss which medications you need to take/hold before your surgery.   If you have any questions or concerns, please feel free to call the clinic.   Have a wonderful day,  Dr. Darral Dash [facility] [phone]

## 2023-08-04 ENCOUNTER — Encounter: Payer: Self-pay | Admitting: Student

## 2023-08-05 NOTE — Telephone Encounter (Signed)
Patient calls nurse line checking on status of [employer] paperwork.   Will forward to PCP?

## 2023-08-06 NOTE — Telephone Encounter (Signed)
[employer] form found in RN box.   Form placed up front for pick up.   Copy made for batch scanning.   Will attempt to fax once I can locate fax number.   VM left with Toni Amend requesting a call back with fax information.

## 2023-08-08 ENCOUNTER — Other Ambulatory Visit: Payer: Self-pay | Admitting: Student

## 2023-08-09 MED ORDER — SEMAGLUTIDE (1 MG/DOSE) 4 MG/3ML ~~LOC~~ SOPN: 1.0000 mg | PEN_INJECTOR | SUBCUTANEOUS | 0 refills | Status: DC

## 2023-08-09 NOTE — Telephone Encounter (Signed)
Received call from patient regarding Ozempic.   She reports that she had discussed increasing to 1 mg dose pen at last visit.   She reports that she is tolerating 0.5 mg weekly injections well.She is asking if updated dosage can be sent to pharmacy.    Veronda Prude, RN

## 2023-08-16 ENCOUNTER — Ambulatory Visit (INDEPENDENT_AMBULATORY_CARE_PROVIDER_SITE_OTHER): Payer: Medicare HMO | Admitting: Student

## 2023-08-16 ENCOUNTER — Encounter: Payer: Self-pay | Admitting: Student

## 2023-08-16 ENCOUNTER — Ambulatory Visit (HOSPITAL_COMMUNITY)
Admission: RE | Admit: 2023-08-16 | Discharge: 2023-08-16 | Disposition: A | Discharge status: Home / Self Care | Payer: Medicare HMO | Source: Ambulatory Visit | Attending: Family Medicine | Admitting: Family Medicine

## 2023-08-16 VITALS — BP 139/75 | HR 92 | Ht 60.0 in | Wt 197.2 lb

## 2023-08-16 DIAGNOSIS — Z01818 Encounter for other preprocedural examination: Secondary | ICD-10-CM

## 2023-08-16 DIAGNOSIS — Z01810 Encounter for preprocedural cardiovascular examination: Secondary | ICD-10-CM | POA: Insufficient documentation

## 2023-08-16 MED ORDER — NICOTINE 21 MG/24HR TD PT24
21.0000 mg | MEDICATED_PATCH | Freq: Every day | TRANSDERMAL | 0 refills | Status: DC
Start: 2023-08-16 — End: 2023-10-01

## 2023-08-16 NOTE — Assessment & Plan Note (Addendum)
I am pleased that she seems to have improvement in her respiratory status today after completing treatment for COPD exacerbation at our office visit in November. I prescribed nicotine patches with the hopes that she will be able to reduce her cigarette use over the next 2 weeks prior to her surgery.  We discussed the importance of this from an anesthesia standpoint as well as wound healing.  Functional Capacity: Quite low.  Unable to walk a couple blocks without becoming dyspneic.  Final Assessment:  The patient is at  high risk of complications from an intermediate risk surgery.    I recommend the following additional tests: CBC, CMP, A1c, BNP, chest x-ray.  EKG completed in clinic today, normal sinus rhythm with no acute ST or T wave abnormalities.  No bundle block noted. I recommend the following changes to medications in the perioperative setting: Hold losartan and chlorthalidone on day of surgery.  Per surgeon preference, will also hold aspirin and turmeric on day of surgery.

## 2023-08-16 NOTE — Patient Instructions (Addendum)
It was great seeing you today.  Please go to [facility] on [address] to get your chest x-ray whenever convenient for you over the next couple days.  On the day of your surgery, - HOLD (do NOT take) your Chlorthalidone, Losartan, Aspirin or Turmeric.   Please try to cut down on your cigarette smoking. I sent in nicotine patches to your pharmacy.  We collected blood work today. I will call you if anything is abnormal.   If you have any questions or concerns, please feel free to call the clinic.   Have a wonderful day,  Dr. Darral Dash [facility] [phone]

## 2023-08-16 NOTE — Progress Notes (Signed)
    SUBJECTIVE:   CHIEF COMPLAINT / HPI:   Alexandra Campos is a pleasant 67 year old female here for surgical preoperative evaluation in anticipation of robotic assisted bilateral salpingo-oophorectomy on 08/31/2023.  Procedural risk: Intermediate  Anesthesia: General  PMH: T2DM- Metformin 2,000 mg daily, Jardaince 25 mg daily, Ozempic 1 mg HTN- Losartan 100 mg daily, Chlorthalidone 10 mg  COPD- Spiriva Respimat 2 sprays, Fluticosane- Salmeterol CKD-none CAD- ASA 81 mg daily  Surgical History:  The patient denies any complication with anesthesia, bleeding, or post-operative confusion, nausea, or vomiting in prior surgical interventions. The patient denies any  history of spinal surgeries.  Family History: The patient denies any family history of complications with anesthesia and VTE.  Social History: Tobacco Use: 1 PPD Alcohol Use: 3-4x a week, 4 drinks  Other substance use: Marijuana, 1x day, smokes a joint per day  Screening for OSA: STOP BANG score of 4, high risk of OSA  PERTINENT  PMH / PSH: T2DM, COPD, hypertension, tobacco dependence  OBJECTIVE:   BP 139/75   Pulse 92   Ht 5' (1.524 m)   Wt 197 lb 3.2 oz (89.4 kg)   SpO2 100%   BMI 38.51 kg/m  General: Pleasant, chronically ill-appearing Cardiac: Regular rate and rhythm Respiratory: Normal work of breathing on room air.  Diminished sounds in lower lung fields with scant crackles Abdomen: Soft, nontender nondistended Extremities: Warm and well-perfused, no peripheral edema  ASSESSMENT/PLAN:   Pre-op evaluation I am pleased that she seems to have improvement in her respiratory status today after completing treatment for COPD exacerbation at our office visit in November. I prescribed nicotine patches with the hopes that she will be able to reduce her cigarette use over the next 2 weeks prior to her surgery.  We discussed the importance of this from an anesthesia standpoint as well as wound  healing.  Functional Capacity: Quite low.  Unable to walk a couple blocks without becoming dyspneic.  Final Assessment:  The patient is at  high risk of complications from an intermediate risk surgery.    I recommend the following additional tests: CBC, CMP, A1c, BNP, chest x-ray.  EKG completed in clinic today, normal sinus rhythm with no acute ST or T wave abnormalities.  No bundle block noted. I recommend the following changes to medications in the perioperative setting: Hold losartan and chlorthalidone on day of surgery.  Per surgeon preference, will also hold aspirin and turmeric on day of surgery.   Darral Dash, DO [facility]

## 2023-08-17 ENCOUNTER — Telehealth: Payer: Self-pay

## 2023-08-17 LAB — COMPREHENSIVE METABOLIC PANEL
ALT: 14 [IU]/L (ref 0–32)
AST: 11 [IU]/L (ref 0–40)
Albumin: 4 g/dL (ref 3.9–4.9)
Alkaline Phosphatase: 102 [IU]/L (ref 44–121)
BUN/Creatinine Ratio: 20 (ref 12–28)
BUN: 23 mg/dL (ref 8–27)
Bilirubin Total: 0.3 mg/dL (ref 0.0–1.2)
CO2: 23 mmol/L (ref 20–29)
Calcium: 9.7 mg/dL (ref 8.7–10.3)
Chloride: 102 mmol/L (ref 96–106)
Creatinine, Ser: 1.14 mg/dL — ABNORMAL HIGH (ref 0.57–1.00)
Globulin, Total: 2.9 g/dL (ref 1.5–4.5)
Glucose: 203 mg/dL — ABNORMAL HIGH (ref 70–99)
Potassium: 3.7 mmol/L (ref 3.5–5.2)
Sodium: 142 mmol/L (ref 134–144)
Total Protein: 6.9 g/dL (ref 6.0–8.5)
eGFR: 53 mL/min/{1.73_m2} — ABNORMAL LOW (ref >59)

## 2023-08-17 LAB — CBC WITH DIFFERENTIAL/PLATELET
Basophils Absolute: 0.1 10*3/uL (ref 0.0–0.2)
Basos: 1 %
EOS (ABSOLUTE): 0.2 10*3/uL (ref 0.0–0.4)
Eos: 3 %
Hematocrit: 32.6 % — ABNORMAL LOW (ref 34.0–46.6)
Hemoglobin: 10 g/dL — ABNORMAL LOW (ref 11.1–15.9)
Immature Grans (Abs): 0 10*3/uL (ref 0.0–0.1)
Immature Granulocytes: 0 %
Lymphocytes Absolute: 2.3 10*3/uL (ref 0.7–3.1)
Lymphs: 33 %
MCH: 25.5 pg — ABNORMAL LOW (ref 26.6–33.0)
MCHC: 30.7 g/dL — ABNORMAL LOW (ref 31.5–35.7)
MCV: 83 fL (ref 79–97)
Monocytes Absolute: 0.4 10*3/uL (ref 0.1–0.9)
Monocytes: 6 %
Neutrophils Absolute: 3.8 10*3/uL (ref 1.4–7.0)
Neutrophils: 57 %
Platelets: 296 10*3/uL (ref 150–450)
RBC: 3.92 x10E6/uL (ref 3.77–5.28)
RDW: 14.7 % (ref 11.7–15.4)
WBC: 6.8 10*3/uL (ref 3.4–10.8)

## 2023-08-17 LAB — HEMOGLOBIN A1C
Est. average glucose Bld gHb Est-mCnc: 209 mg/dL
Hgb A1c MFr Bld: 8.9 % — ABNORMAL HIGH (ref 4.8–5.6)

## 2023-08-17 LAB — BRAIN NATRIURETIC PEPTIDE: BNP: 27.4 pg/mL (ref 0.0–100.0)

## 2023-08-17 NOTE — Telephone Encounter (Signed)
Patient calls nurse line requesting to speak with Dr. Melissa Noon regarding lab results from visit yesterday.   She reports attempting to view these via mychart, however, does not understand what these results mean.   Will forward to PCP.  Patient requesting returned call at [phone].  Veronda Prude, RN

## 2023-08-17 NOTE — Progress Notes (Signed)
COVID Vaccine received:  []  No [x]  Yes Date of any COVID positive Test in last 90 days:  PCP - Darral Dash, Do at [facility]  medical clearance in 08-16-23  Epic note Cardiologist -   Chest x-ray -  EKG -  08-16-2023  Epic  Stress Test -  ECHO -  Cardiac Cath -   PCR screen: []  Ordered & Completed [x]   No Order but Needs PROFEND     []   N/A for this surgery  Surgery Plan:  [x]  Ambulatory   []  Outpatient in bed  []  Admit Anesthesia:    [x]  General  []  Spinal  []   Choice []   MAC  Bowel Prep - []  No  []   Yes __GYN Diet____  Pacemaker / ICD device [x]  No []  Yes   Spinal Cord Stimulator:[x]  No []  Yes       History of Sleep Apnea? [x]  No []  Yes   CPAP used?- [x]  No []  Yes    Does the patient monitor blood sugar?   []  N/A   []  No []  Yes  Patient has: []  NO Hx DM   []  Pre-DM   []  DM1  [x]   DM2 Last A1c was: 8.9  on 08-16-2023     Does patient have a [employer] or Dexacom? []  No []  Yes   Fasting Blood Sugar Ranges-  Checks Blood Sugar _____ times a day  GLP1 agonist / usual dose - Semaglutide on Mondays GLP1 instructions:  last dose on Monday 08-23-23 SGLT-2 inhibitors / usual dose - empagliflozin (Jardiance)  SGLT-2 instructions: Hold x 72 hours  Last dose: Friday 08-27-23 Metformin:  1000mg  BID,   Hold DOS  Blood Thinner / Instructions: none Aspirin Instructions:  ASA 81 mg  ERAS Protocol Ordered: []  No  [x]  Yes PRE-SURGERY []  ENSURE  []  G2   [x]  No Drink Ordered Patient is to be NPO after: 0545  Dental hx: []  Dentures:  []  N/A      []  Bridge or Partial:                   []  Loose or Damaged teeth:   Comments:   Activity level: Patient is able / unable to climb a flight of stairs without difficulty; []  No CP  []  No SOB, but would have ___   Patient can / can not perform ADLs without assistance.   Anesthesia review: DM2, smoker (cigs / THC), COPD, ETOH, DOE, CKD3, s/p removal Throat polyps??  Patient denies shortness of breath, fever, cough and chest pain  at PAT appointment.  Patient verbalized understanding and agreement to the Pre-Surgical Instructions that were given to them at this PAT appointment. Patient was also educated of the need to review these PAT instructions again prior to her surgery.I reviewed the appropriate phone numbers to call if they have any and questions or concerns.

## 2023-08-17 NOTE — Patient Instructions (Addendum)
SURGICAL WAITING ROOM VISITATION Patients having surgery or a procedure may have no more than 2 support people in the waiting area - these visitors may rotate in the visitor waiting room.   Due to an increase in RSV and influenza rates and associated hospitalizations, children ages 38 and under may not visit patients in [facility] hospitals. If the patient needs to stay at the hospital during part of their recovery, the visitor guidelines for inpatient rooms apply.  PRE-OP VISITATION  Pre-op nurse will coordinate an appropriate time for 1 support person to accompany the patient in pre-op.  This support person may not rotate.  This visitor will be contacted when the time is appropriate for the visitor to come back in the pre-op area.  Please refer to the [facility] website for the visitor guidelines for Inpatients (after your surgery is over and you are in a regular room).  You are not required to quarantine at this time prior to your surgery. However, you must do this: Hand Hygiene often Do NOT share personal items Notify your provider if you are in close contact with someone who has COVID or you develop fever 100.4 or greater, new onset of sneezing, cough, sore throat, shortness of breath or body aches.  If you test positive for Covid or have been in contact with anyone that has tested positive in the last 10 days please notify you surgeon.    Your procedure is scheduled on:  TUESDAY  August 31, 2023  Report to [facility] Main Entrance: Leota Jacobsen entrance where the [employer] is available.   Report to admitting at: 06:30    AM  Call this number if you have any questions or problems the morning of surgery [phone]  Do not eat food after Midnight the night prior to your surgery/procedure.  After Midnight you may have the following liquids until   05:45 AM  DAY OF SURGERY  Clear Liquid Diet Water Black Coffee (sugar ok, NO MILK/CREAM OR CREAMERS)  Tea (sugar ok, NO  MILK/CREAM OR CREAMERS) regular and decaf                             Plain Jell-O  with no fruit (NO RED)                                           Fruit ices (not with fruit pulp, NO RED)                                     Popsicles (NO RED)                                                                  Juice: NO CITRUS JUICES: only apple, WHITE grape, WHITE cranberry Sports drinks like Gatorade or Powerade (NO RED)              FOLLOW ANY ADDITIONAL PRE OP INSTRUCTIONS YOU RECEIVED FROM YOUR SURGEON'S OFFICE!!!  Eat a light diet the day before surgery.  Examples including soups, broths, toast, yogurt, mashed potatoes.  AVOID GAS PRODUCING FOODS. Things to avoid include carbonated beverages (fizzy beverages, sodas), raw fruits and raw vegetables (uncooked), or beans.      Oral Hygiene is also important to reduce your risk of infection.        Remember - BRUSH YOUR TEETH THE MORNING OF SURGERY WITH YOUR REGULAR TOOTHPASTE  Diabetic Medications:    Semaglutide on Mondays   do not take for 7 day prior to your surgery;  last injection on Monday 08-23-23   empagliflozin (Jardiance) Hold x 72 hours  Last dose: Friday 08-27-23  Metformin:  1000mg  BID,   Day BEFORE surgery: take as usual doses,  DAY OF SURERY:   DO NOT TAKE METFORMIN   Do NOT smoke after Midnight the night before surgery.  STOP TAKING all Vitamins, Herbs and supplements 1 week before your surgery.   Take ONLY these medicines the morning of surgery with A SIP OF WATER: cetirizine (Zyrtec), Tylenol if needed.  You may use your Spiriva, Wixela and Albuterol inhalers if needed.                    You may not have any metal on your body including hair pins, jewelry, and body piercing  Do not wear make-up, lotions, powders, perfumes  or deodorant  Do not wear nail polish including gel and S&S, artificial / acrylic nails, or any other type of covering on natural nails including finger and toenails. If you have artificial  nails, gel coating, etc., that needs to be removed by a nail salon, Please have this removed prior to surgery. Not doing so may mean that your surgery could be cancelled or delayed if the Surgeon or anesthesia staff feels like they are unable to monitor you safely.   Do not shave 48 hours prior to surgery to avoid nicks in your skin which may contribute to postoperative infections.    Contacts, Hearing Aids, dentures or bridgework may not be worn into surgery. DENTURES WILL BE REMOVED PRIOR TO SURGERY PLEASE DO NOT APPLY "Poly grip" OR ADHESIVES!!!  Patients discharged on the day of surgery will not be allowed to drive home.  Someone NEEDS to stay with you for the first 24 hours after anesthesia.  Do not bring your home medications to the hospital. The Pharmacy will dispense medications listed on your medication list to you during your admission in the Hospital.   Please read over the following fact sheets you were given: IF YOU HAVE QUESTIONS ABOUT YOUR PRE-OP INSTRUCTIONS, PLEASE CALL [phone]   [facility] - Preparing for Surgery Before surgery, you can play an important role.  Because skin is not sterile, your skin needs to be as free of germs as possible.  You can reduce the number of germs on your skin by washing with CHG (chlorahexidine gluconate) soap before surgery.  CHG is an antiseptic cleaner which kills germs and bonds with the skin to continue killing germs even after washing. Please DO NOT use if you have an allergy to CHG or antibacterial soaps.  If your skin becomes reddened/irritated stop using the CHG and inform your nurse when you arrive at Short Stay. Do not shave (including legs and underarms) for at least 48 hours prior to the first CHG shower.  You may shave your face/neck.  Please follow these instructions carefully:  1.  Shower with CHG Soap the night before surgery and the  morning of surgery.  2.  If you choose to wash your hair, wash your hair first as usual  with your normal  shampoo.  3.  After you shampoo, rinse your hair and body thoroughly to remove the shampoo.                             4.  Use CHG as you would any other liquid soap.  You can apply chg directly to the skin and wash.  Gently with a scrungie or clean washcloth.  5.  Apply the CHG Soap to your body ONLY FROM THE NECK DOWN.   Do not use on face/ open                           Wound or open sores. Avoid contact with eyes, ears mouth and genitals (private parts).                       Wash face,  Genitals (private parts) with your normal soap.             6.  Wash thoroughly, paying special attention to the area where your  surgery  will be performed.  7.  Thoroughly rinse your body with warm water from the neck down.  8.  DO NOT shower/wash with your normal soap after using and rinsing off the CHG Soap.            9.  Pat yourself dry with a clean towel.            10.  Wear clean pajamas.            11.  Place clean sheets on your bed the night of your first shower and do not  sleep with pets.  ON THE DAY OF SURGERY : Do not apply any lotions/deodorants the morning of surgery.  Please wear clean clothes to the hospital/surgery center.     FAILURE TO FOLLOW THESE INSTRUCTIONS MAY RESULT IN THE CANCELLATION OF YOUR SURGERY  PATIENT SIGNATURE_________________________________  NURSE SIGNATURE__________________________________  ________________________________________________________________________       Alexandra Campos    An incentive spirometer is a tool that can help keep your lungs clear and active. This tool measures how well you are filling your lungs with each breath. Taking long deep breaths may help reverse or decrease the chance of developing breathing (pulmonary) problems (especially infection) following: A long period of time when you are unable to move or be active. BEFORE THE PROCEDURE  If the spirometer includes an indicator to show your best  effort, your nurse or respiratory therapist will set it to a desired goal. If possible, sit up straight or lean slightly forward. Try not to slouch. Hold the incentive spirometer in an upright position. INSTRUCTIONS FOR USE  Sit on the edge of your bed if possible, or sit up as far as you can in bed or on a chair. Hold the incentive spirometer in an upright position. Breathe out normally. Place the mouthpiece in your mouth and seal your lips tightly around it. Breathe in slowly and as deeply as possible, raising the piston or the ball toward the top of the column. Hold your breath for 3-5 seconds or for as long as possible. Allow the piston or ball to fall to the bottom of the column. Remove the mouthpiece from your mouth and breathe out normally. Rest for a  few seconds and repeat Steps 1 through 7 at least 10 times every 1-2 hours when you are awake. Take your time and take a few normal breaths between deep breaths. The spirometer may include an indicator to show your best effort. Use the indicator as a goal to work toward during each repetition. After each set of 10 deep breaths, practice coughing to be sure your lungs are clear. If you have an incision (the cut made at the time of surgery), support your incision when coughing by placing a pillow or rolled up towels firmly against it. Once you are able to get out of bed, walk around indoors and cough well. You may stop using the incentive spirometer when instructed by your caregiver.  RISKS AND COMPLICATIONS Take your time so you do not get dizzy or light-headed. If you are in pain, you may need to take or ask for pain medication before doing incentive spirometry. It is harder to take a deep breath if you are having pain. AFTER USE Rest and breathe slowly and easily. It can be helpful to keep track of a log of your progress. Your caregiver can provide you with a simple table to help with this. If you are using the spirometer at home, follow  these instructions: SEEK MEDICAL CARE IF:  You are having difficultly using the spirometer. You have trouble using the spirometer as often as instructed. Your pain medication is not giving enough relief while using the spirometer. You develop fever of 100.5 F (38.1 C) or higher.                                                                                                    SEEK IMMEDIATE MEDICAL CARE IF:  You cough up bloody sputum that had not been present before. You develop fever of 102 F (38.9 C) or greater. You develop worsening pain at or near the incision site. MAKE SURE YOU:  Understand these instructions. Will watch your condition. Will get help right away if you are not doing well or get worse. Document Released: 01/11/2007 Document Revised: 11/23/2011 Document Reviewed: 03/14/2007 [facility] Patient Information 2014 [place], [place].     WHAT IS A BLOOD TRANSFUSION? Blood Transfusion Information  A transfusion is the replacement of blood or some of its parts. Blood is made up of multiple cells which provide different functions. Red blood cells carry oxygen and are used for blood loss replacement. White blood cells fight against infection. Platelets control bleeding. Plasma helps clot blood. Other blood products are available for specialized needs, such as hemophilia or other clotting disorders. BEFORE THE TRANSFUSION  Who gives blood for transfusions?  Healthy volunteers who are fully evaluated to make sure their blood is safe. This is blood bank blood. Transfusion therapy is the safest it has ever been in the practice of medicine. Before blood is taken from a donor, a complete history is taken to make sure that person has no history of diseases nor engages in risky social behavior (examples are intravenous drug use or sexual activity with multiple partners). The donor's travel history  is screened to minimize risk of transmitting infections, such as malaria. The donated  blood is tested for signs of infectious diseases, such as HIV and hepatitis. The blood is then tested to be sure it is compatible with you in order to minimize the chance of a transfusion reaction. If you or a relative donates blood, this is often done in anticipation of surgery and is not appropriate for emergency situations. It takes many days to process the donated blood. RISKS AND COMPLICATIONS Although transfusion therapy is very safe and saves many lives, the main dangers of transfusion include:  Getting an infectious disease. Developing a transfusion reaction. This is an allergic reaction to something in the blood you were given. Every precaution is taken to prevent this. The decision to have a blood transfusion has been considered carefully by your caregiver before blood is given. Blood is not given unless the benefits outweigh the risks. AFTER THE TRANSFUSION Right after receiving a blood transfusion, you will usually feel much better and more energetic. This is especially true if your red blood cells have gotten low (anemic). The transfusion raises the level of the red blood cells which carry oxygen, and this usually causes an energy increase. The nurse administering the transfusion will monitor you carefully for complications. HOME CARE INSTRUCTIONS  No special instructions are needed after a transfusion. You may find your energy is better. Speak with your caregiver about any limitations on activity for underlying diseases you may have. SEEK MEDICAL CARE IF:  Your condition is not improving after your transfusion. You develop redness or irritation at the intravenous (IV) site. SEEK IMMEDIATE MEDICAL CARE IF:  Any of the following symptoms occur over the next 12 hours: Shaking chills. You have a temperature by mouth above 102 F (38.9 C), not controlled by medicine. Chest, back, or muscle pain. People around you feel you are not acting correctly or are confused. Shortness of breath or  difficulty breathing. Dizziness and fainting. You get a rash or develop hives. You have a decrease in urine output. Your urine turns a dark color or changes to pink, red, or brown. Any of the following symptoms occur over the next 10 days: You have a temperature by mouth above 102 F (38.9 C), not controlled by medicine. Shortness of breath. Weakness after normal activity. The white part of the eye turns yellow (jaundice). You have a decrease in the amount of urine or are urinating less often. Your urine turns a dark color or changes to pink, red, or brown. Document Released: 08/28/2000 Document Revised: 11/23/2011 Document Reviewed: 04/16/2008 [facility] Patient Information 2014 [place], [place].  _______________________________________________________________________

## 2023-08-18 ENCOUNTER — Encounter (HOSPITAL_COMMUNITY): Payer: Self-pay

## 2023-08-18 ENCOUNTER — Encounter (HOSPITAL_COMMUNITY)
Admission: RE | Admit: 2023-08-18 | Discharge: 2023-08-18 | Disposition: A | Discharge status: Home / Self Care | Payer: Medicare HMO | Source: Ambulatory Visit | Attending: Psychiatry | Admitting: Psychiatry

## 2023-08-18 ENCOUNTER — Other Ambulatory Visit: Payer: Self-pay

## 2023-08-18 VITALS — BP 150/82 | Temp 98.1°F | Resp 20 | Ht 60.0 in | Wt 194.0 lb

## 2023-08-18 DIAGNOSIS — Z01818 Encounter for other preprocedural examination: Secondary | ICD-10-CM | POA: Diagnosis present

## 2023-08-18 DIAGNOSIS — Z01812 Encounter for preprocedural laboratory examination: Secondary | ICD-10-CM | POA: Insufficient documentation

## 2023-08-18 DIAGNOSIS — N9489 Other specified conditions associated with female genital organs and menstrual cycle: Secondary | ICD-10-CM | POA: Insufficient documentation

## 2023-08-18 DIAGNOSIS — E1122 Type 2 diabetes mellitus with diabetic chronic kidney disease: Secondary | ICD-10-CM | POA: Insufficient documentation

## 2023-08-18 DIAGNOSIS — I129 Hypertensive chronic kidney disease with stage 1 through stage 4 chronic kidney disease, or unspecified chronic kidney disease: Secondary | ICD-10-CM | POA: Diagnosis not present

## 2023-08-18 DIAGNOSIS — N183 Chronic kidney disease, stage 3 unspecified: Secondary | ICD-10-CM | POA: Insufficient documentation

## 2023-08-18 HISTORY — DX: Other psychoactive substance abuse, uncomplicated: F19.10

## 2023-08-18 LAB — MICROALBUMIN / CREATININE URINE RATIO
Creatinine, Urine: 61 mg/dL
Microalb/Creat Ratio: 14 mg/g{creat} (ref 0–29)
Microalbumin, Urine: 8.3 ug/mL

## 2023-08-18 NOTE — Progress Notes (Signed)
Patient's T&S showed POSITIVE ANTIBODIES. Please redraw DOS on 08-31-23. Having GYN surgery with Dr. Alvester Morin, possible Hysterectomy, possible staging.      Rudean Haskell, BSN, CVRN-BC   Pre-Surgical Testing Nurse [facility]  [phone]

## 2023-08-19 ENCOUNTER — Telehealth: Payer: Self-pay

## 2023-08-19 NOTE — Telephone Encounter (Signed)
Patient contacted for follow-up of CGM request.   Informed patient that insurance typically requires insulin use to qualify for CGM.  I also shared that she may qualify for CGM in a study for patient with A1C > 8 Her most recent A1C was 8.9 on 12/2  She believes her phone is ~ 65 year old and does download apps.  I asked her to download the [employer] app prior to visit scheduled for next Monday AM at 10:00 She thanked me for the call.    Total time with patient call and documentation of interaction: 14 minutes.

## 2023-08-19 NOTE — Telephone Encounter (Signed)
Patient calls nurse line requesting to start a CGM.   Patients last A1c was 12/2 and 8.9.  Patient advised will forward to PCP for advisement.

## 2023-08-20 NOTE — Telephone Encounter (Signed)
Reviewed and agree with Dr Koval's plan.   

## 2023-08-22 DIAGNOSIS — X501XXA Overexertion from prolonged static or awkward postures, initial encounter: Secondary | ICD-10-CM | POA: Diagnosis not present

## 2023-08-22 DIAGNOSIS — J0100 Acute maxillary sinusitis, unspecified: Secondary | ICD-10-CM | POA: Diagnosis not present

## 2023-08-22 DIAGNOSIS — S161XXA Strain of muscle, fascia and tendon at neck level, initial encounter: Secondary | ICD-10-CM | POA: Diagnosis not present

## 2023-08-22 DIAGNOSIS — M542 Cervicalgia: Secondary | ICD-10-CM | POA: Diagnosis not present

## 2023-08-23 ENCOUNTER — Encounter: Payer: Medicare HMO | Admitting: Pharmacist

## 2023-08-23 ENCOUNTER — Ambulatory Visit
Admission: RE | Admit: 2023-08-23 | Discharge: 2023-08-23 | Disposition: A | Discharge status: Home / Self Care | Payer: Medicare HMO | Source: Ambulatory Visit | Attending: Pediatrics | Admitting: Pediatrics

## 2023-08-23 ENCOUNTER — Encounter: Payer: Self-pay | Admitting: Pharmacist

## 2023-08-23 VITALS — Wt 195.8 lb

## 2023-08-23 DIAGNOSIS — I517 Cardiomegaly: Secondary | ICD-10-CM | POA: Diagnosis not present

## 2023-08-23 DIAGNOSIS — E1122 Type 2 diabetes mellitus with diabetic chronic kidney disease: Secondary | ICD-10-CM

## 2023-08-23 DIAGNOSIS — N183 Chronic kidney disease, stage 3 unspecified: Secondary | ICD-10-CM

## 2023-08-23 DIAGNOSIS — I700 Atherosclerosis of aorta: Secondary | ICD-10-CM | POA: Diagnosis not present

## 2023-08-23 DIAGNOSIS — Z01818 Encounter for other preprocedural examination: Secondary | ICD-10-CM | POA: Diagnosis not present

## 2023-08-23 DIAGNOSIS — I129 Hypertensive chronic kidney disease with stage 1 through stage 4 chronic kidney disease, or unspecified chronic kidney disease: Secondary | ICD-10-CM

## 2023-08-23 DIAGNOSIS — F17210 Nicotine dependence, cigarettes, uncomplicated: Secondary | ICD-10-CM | POA: Diagnosis not present

## 2023-08-23 LAB — POCT GLYCOSYLATED HEMOGLOBIN (HGB A1C): HbA1c, POC (controlled diabetic range): 8.6 % — AB (ref 0.0–7.0)

## 2023-08-23 NOTE — Assessment & Plan Note (Signed)
LIBERATE Study:  -Patient provided verbal consent to participate in the study. Consent documented in electronic medical record.  -Provided education on Libre 3 CGM. Collaborated to ensure Josephine Igo 3 app was downloaded on patient's phone. Educated on how to place sensor every 14 days, patient placed first sensor correctly and verbalized understanding of use, removal, and how to place next sensor. Discussed alarms. 8 sensors provided for a 3 month supply. Educated to contact the office if the sensor falls off early and replacements are needed before their next [employer].   Diabetes longstanding and currently with poor control of glucose, A1C 8.6   Patient is ble to verbalize appropriate hypoglycemia management plan. Medication adherence appears good. Control is suboptimal due to inadequate regimen plus sedentary lifestyle and dietary indiscretion. -Continued GLP-1 Ozempic (semaglutide) 1mg  weekly.   -Continued SGLT2-I Jardiance (empagliflozin) 25mg  daily. Counseled on sick day rules. -Continued metformin 1000 mg BID   -Patient educated on purpose, proper use, and potential adverse effects. -Extensively discussed pathophysiology of diabetes, recommended lifestyle interventions, dietary effects on blood sugar control.  -Counseled on s/sx of and management of hypoglycemia.  -Next A1c anticipated 3 months at middle Liberate study visit.

## 2023-08-23 NOTE — Patient Instructions (Signed)
It was nice to see you today!  Your goal sugar is 80-130 before eating and less than 180 after eating.  Medication Changes: Continue all other medication the same.   Keep up the good work with diet and exercise. Aim for a diet full of vegetables, fruit and lean meats (chicken, [place], fish). Try to limit salt intake by eating fresh or frozen vegetables (instead of canned), rinse canned vegetables prior to cooking and do not add any additional salt to meals.   Sensor Application If using the App, you can tap Help in the Main Menu to access an in-app tutorial on applying a Sensor. See below for instructions on how to download the app. Apply Sensors only on the back of your upper arm. If placed in other areas, the Sensor may not function properly and could give you inaccurate readings. Avoid areas with scars, moles, stretch marks, or lumps.   Select an area of skin that generally stays flat during your normal daily activities (no bending or folding). Choose a site that is at least 1 inch (2.5 cm) away from any injection sites. To prevent discomfort or skin irritation, you should select a different site other than the one most recently used. Wash application site using a plain soap, dry, and then clean with an alcohol wipe. This will help remove any oily residue that may prevent the sensor from sticking properly. Allow site to air dry before proceeding. Note: The area MUST be clean and dry, or the Sensor may not stay on for the full wear duration specified by your Sensor insert. 4. Unscrew the cap from the Sensor Applicator and set the cap aside.  5. Place the Sensor Applicator over the prepared site and push down firmly to apply the Sensor to your body. 6. Gently pull the Sensor Applicator away from your body. The Sensor should now be attached to your skin. 7. Make sure the Sensor is secure after application. Put the cap back on the Sensor Applicator. Discard the used Engineer, agricultural according to  local regulations.  What If My Sensor Falls Off or What If My Sensor Isn't Working? Call [employer] Customer Care Team at [phone] Available 7 days a week from 8AM-8PM EST, excluding holidays If yo have multiple sensors fall off prior to 14 days of use, contact [facility] at [phone]   The App Download the [employer] 3 App in your phone's app store   Load the app and select get started now Create an account  Tap scan new sensor Follow the prompts on the screen. If your sensor does not sync, try moving your phone slowly around the sensor. Phone cases may affect scanning. This will be the only time you have to scan the sensor until you apply a new sensor in 14 days.  There will be a 60 minute start up period until the app will display your glucose reading

## 2023-08-23 NOTE — Research (Signed)
S:     Chief Complaint  Patient presents with   Medication Management    Liberate Enrollment - initial visit   67 y.o. female who presents for diabetes evaluation, education, and management in the context of the LIBERATE Study.   PMH is significant for upcoming surgery, and history of tobacco use.  Patient was referred and last seen by Primary Care Provider, Dr. Melissa Noon, on 08/16/2023.   Recently, patient inquired about possible use of CGM.  We discussed opportunity for involvement in LIBERATE study. She comes today for discussion and possible enrollment.   Today, patient arrives in fair-good spirits and presents with assistance of a cane.  Family/Social History: Has a friend that uses Libre3 CGM  Current diabetes medications include: metformin, empagliflozin and semaglutide Current hypertension medications include: losaratan and chlorthalidone Current hyperlipidemia medications include: atorvastatin  Patient reports adherence to taking all medications as prescribed.  She reports a recent steroid burst ~ 2 weeks ago and yesterday being started on antibiotics for sinus infection.   Do you feel that your medications are working for you? yes Have you been experiencing any side effects to the medications prescribed? no  Patient denies hypoglycemic events.  However, reports not feeling well when glucose readings are < 130mg /dl.   Majority of this visit was spent initiating APP and linking to sensor.  O:   Review of Systems  Constitutional:  Negative for fever.  HENT:  Positive for congestion.   Musculoskeletal:  Positive for neck pain.    Physical Exam Constitutional:      Appearance: Normal appearance.  Pulmonary:     Effort: Pulmonary effort is normal.  Neurological:     Mental Status: She is alert.  Psychiatric:        Mood and Affect: Mood normal.        Thought Content: Thought content normal.        Judgment: Judgment normal.    Lab Results  Component Value  Date   HGBA1C 8.6 (A) 08/23/2023   Lipid Panel     Component Value Date/Time   CHOL 112 11/26/2021 1054   TRIG 98 11/26/2021 1054   HDL 54 11/26/2021 1054   CHOLHDL 2.1 11/26/2021 1054   LDLCALC 40 11/26/2021 1054    Clinical Atherosclerotic Cardiovascular Disease (ASCVD):  The ASCVD Risk score (Arnett DK, et al., 2019) failed to calculate for the following reasons:   The valid total cholesterol range is 130 to 320 mg/dL   Patient is participating in a Managed Medicaid Plan:  Yes     A/P:  LIBERATE Study:  -Patient provided verbal consent to participate in the study. Consent documented in electronic medical record.  -Provided education on Libre 3 CGM. Collaborated to ensure Josephine Igo 3 app was downloaded on patient's phone. Educated on how to place sensor every 14 days, patient placed first sensor correctly and verbalized understanding of use, removal, and how to place next sensor. Discussed alarms. 8 sensors provided for a 3 month supply. Educated to contact the office if the sensor falls off early and replacements are needed before their next [employer].   Diabetes longstanding and currently with poor control of glucose, A1C 8.6   Patient is ble to verbalize appropriate hypoglycemia management plan. Medication adherence appears good. Control is suboptimal due to inadequate regimen plus sedentary lifestyle and dietary indiscretion. -Continued GLP-1 Ozempic (semaglutide) 1mg  weekly.   -Continued SGLT2-I Jardiance (empagliflozin) 25mg  daily. Counseled on sick day rules. -Continued metformin 1000 mg BID   -  Patient educated on purpose, proper use, and potential adverse effects. -Extensively discussed pathophysiology of diabetes, recommended lifestyle interventions, dietary effects on blood sugar control.  -Counseled on s/sx of and management of hypoglycemia.  -Next A1c anticipated 3 months at middle Liberate study visit.   Patient is pending surgery for bilateral oophorectomy next  week.    Written patient instructions provided. Patient verbalized understanding of treatment plan.  Total time in face to face counseling 47 minutes.    Follow-up:  Pharmacist 09/20/2023. PCP clinic visit: early 2025 - Dr. Melissa Noon planned but not yet scheduled.

## 2023-08-23 NOTE — Progress Notes (Signed)
Reviewed and agree with Dr Koval's plan.   

## 2023-08-27 ENCOUNTER — Other Ambulatory Visit: Payer: Self-pay | Admitting: Student

## 2023-08-30 ENCOUNTER — Telehealth: Payer: Self-pay | Admitting: *Deleted

## 2023-08-30 ENCOUNTER — Encounter (HOSPITAL_COMMUNITY): Payer: Self-pay | Admitting: Physician Assistant

## 2023-08-30 ENCOUNTER — Telehealth: Payer: Self-pay

## 2023-08-30 NOTE — Telephone Encounter (Signed)
Telephone call to check on pre-operative status.  Patient compliant with pre-operative instructions.  Reinforced nothing to eat after midnight. Clear liquids until 0500. Patient to arrive at 0600.  No questions or concerns voiced.  Instructed to call for any needs.

## 2023-08-30 NOTE — Anesthesia Preprocedure Evaluation (Addendum)
Anesthesia Evaluation  Patient identified by MRN, date of birth, ID band Patient awake    Reviewed: Allergy & Precautions, H&P , NPO status , Patient's Chart, lab work & pertinent test results  Airway Mallampati: II  TM Distance: >3 FB Neck ROM: Full    Dental no notable dental hx. (+) Partial Upper, Partial Lower, Dental Advisory Given   Pulmonary COPD,  COPD inhaler, Current Smoker and Patient abstained from smoking.   Pulmonary exam normal breath sounds clear to auscultation       Cardiovascular Exercise Tolerance: Good hypertension, Pt. on medications + Peripheral Vascular Disease   Rhythm:Regular Rate:Normal     Neuro/Psych negative neurological ROS  negative psych ROS   GI/Hepatic Neg liver ROS,GERD  ,,  Endo/Other  diabetes, Type 2, Oral Hypoglycemic Agents    Renal/GU negative Renal ROS  negative genitourinary   Musculoskeletal  (+) Arthritis , Osteoarthritis,    Abdominal   Peds  Hematology negative hematology ROS (+)   Anesthesia Other Findings   Reproductive/Obstetrics negative OB ROS                             Anesthesia Physical Anesthesia Plan  ASA: 3  Anesthesia Plan: General   Post-op Pain Management: Tylenol PO (pre-op)*   Induction: Intravenous  PONV Risk Score and Plan: 3 and Ondansetron, Dexamethasone and Treatment may vary due to age or medical condition  Airway Management Planned: Oral ETT  Additional Equipment:   Intra-op Plan:   Post-operative Plan: Extubation in OR  Informed Consent: I have reviewed the patients History and Physical, chart, labs and discussed the procedure including the risks, benefits and alternatives for the proposed anesthesia with the patient or authorized representative who has indicated his/her understanding and acceptance.     Dental advisory given  Plan Discussed with: CRNA  Anesthesia Plan Comments: (See PAT note  08/30/2023)       Anesthesia Quick Evaluation

## 2023-08-30 NOTE — Progress Notes (Signed)
Anesthesia Chart Review   Case: [id number] Date/Time: 08/31/23 0800   Procedures:      XI ROBOTIC ASSISTED BILATERAL SALPINGO OOPHORECTOMY (Bilateral)     DILATATION & CURETTAGE/HYSTEROSCOPY WITH MYOSURE     POSSIBLE XI ROBOTIC ASSISTED TOTAL HYSTERECTOMY     POSSIBLE STAGING   Anesthesia type: General   Pre-op diagnosis: ADNEXAL MASS, ELEVATED MARKERS   Location: [facility] ROOM 05 / [facility]   Surgeons: Clide Cliff, MD       DISCUSSION:67 y.o. smoker with h/o HTN, DM II, CKD, PVD, COPD, adnexal mass/elevated marker scheduled for above procedure 08/31/2023 with Dr. Clide Cliff.   Pt was seen by PCP, Dr. Darral Dash, on 08/16/23 for preoperative evaluation.  Per her assessment, "Pre-op evaluation I am pleased that she seems to have improvement in her respiratory status today after completing treatment for COPD exacerbation at our office visit in November. I prescribed nicotine patches with the hopes that she will be able to reduce her cigarette use over the next 2 weeks prior to her surgery.  We discussed the importance of this from an anesthesia standpoint as well as wound healing.   Functional Capacity: Quite low.  Unable to walk a couple blocks without becoming dyspneic.   Final Assessment:   The patient is at  high risk of complications from an intermediate risk surgery."  EKG at this visit with no acute ST or T wave abormalities.    I contacted Dr. Melissa Noon to discuss her proep eval, unable to reach/no call back.  Discussed with Dr. Tacy Dura, will proceed as long as pt understands risk, monitor respiratory status.   VS: There were no vitals taken for this visit.  PROVIDERS: Darral Dash, DO is PCP    LABS: Labs reviewed: Acceptable for surgery. (all labs ordered are listed, but only abnormal results are displayed)  Labs Reviewed - No data to display   IMAGES:   EKG:   CV:  Past Medical History:  Diagnosis Date   Abnormal ankle brachial index (ABI)  01/14/2020   Abnormal facial hair 11/24/2019   Arthritis    Chronic kidney disease    Complex cyst of right ovary 04/09/2020   Likely a dermoid. Normal CA 125     COPD (chronic obstructive pulmonary disease) (HCC)    Diabetes mellitus without complication (HCC)    GERD (gastroesophageal reflux disease)    Hyperlipidemia    Hypertension    Ovarian cyst 03/09/2020   CA-125 17.3     Peripheral vascular disease (HCC)    Postmenopausal bleeding 02/07/2020   Rash 04/20/2022   Right upper quadrant pain 01/21/2022   Stress incontinence of urine 10/03/2019   Substance abuse (HCC)    ETOH, THC   Wheezing 12/13/2020   Yeast infection involving the vagina and surrounding area 06/10/2020    Past Surgical History:  Procedure Laterality Date   ABDOMINAL AORTOGRAM W/LOWER EXTREMITY N/A 03/15/2020   Procedure: ABDOMINAL AORTOGRAM W/LOWER EXTREMITY;  Surgeon: Chuck Hint, MD;  Location: [facility] INVASIVE CV LAB;  Service: Cardiovascular;  Laterality: N/A;   ANKLE SURGERY Right    ORIF   CARPAL TUNNEL RELEASE Right 11/18/2022   Procedure: RIGHT CARPAL TUNNEL RELEASE;  Surgeon: Tarry Kos, MD;  Location: [facility];  Service: Orthopedics;  Laterality: Right;   THROAT SURGERY     Reports polyps removed from throat, done at [facility] approx 15 years ago   TONSILLECTOMY     TUBAL LIGATION Bilateral    WISDOM TOOTH  EXTRACTION      MEDICATIONS: No current facility-administered medications for this encounter.    acetaminophen (TYLENOL) 650 MG CR tablet   albuterol (VENTOLIN HFA) 108 (90 Base) MCG/ACT inhaler   aspirin EC 81 MG tablet   atorvastatin (LIPITOR) 40 MG tablet   cetirizine (ZYRTEC) 10 MG tablet   chlorthalidone (HYGROTON) 25 MG tablet   empagliflozin (JARDIANCE) 25 MG TABS tablet   fluticasone-salmeterol (WIXELA INHUB) 250-50 MCG/ACT AEPB   losartan (COZAAR) 100 MG tablet   melatonin 3 MG TABS tablet   metFORMIN (GLUCOPHAGE-XR) 500 MG 24 hr tablet   Multiple  Vitamin (MULTIVITAMIN PO)   oxyCODONE (OXY IR/ROXICODONE) 5 MG immediate release tablet   Semaglutide, 1 MG/DOSE, 4 MG/3ML SOPN   senna-docusate (SENOKOT-S) 8.6-50 MG tablet   SPIRIVA RESPIMAT 2.5 MCG/ACT AERS   nicotine (NICODERM CQ - DOSED IN MG/24 HOURS) 21 mg/24hr patch     [facility] Ward, PA-C [facility] Pre-Surgical Testing [phone]

## 2023-08-30 NOTE — Telephone Encounter (Signed)
Spoke with Alexandra Campos in pre-op Short Stay and relayed message from [name], NP that patient requires Type & Screen prior to surgery tomorrow. Pt 's T&S showed positive antibodies. [name] placed note on patient's chart.

## 2023-08-31 ENCOUNTER — Encounter (HOSPITAL_COMMUNITY): Payer: Self-pay | Admitting: Psychiatry

## 2023-08-31 ENCOUNTER — Ambulatory Visit (HOSPITAL_COMMUNITY): Payer: Medicare HMO | Admitting: Physician Assistant

## 2023-08-31 ENCOUNTER — Other Ambulatory Visit: Payer: Self-pay

## 2023-08-31 ENCOUNTER — Encounter (HOSPITAL_COMMUNITY)
Admission: RE | Disposition: A | Discharge status: Home / Self Care | Payer: Self-pay | Source: Ambulatory Visit | Attending: Psychiatry

## 2023-08-31 ENCOUNTER — Ambulatory Visit (HOSPITAL_COMMUNITY)
Admission: RE | Admit: 2023-08-31 | Discharge: 2023-08-31 | Disposition: A | Discharge status: Home / Self Care | Payer: Medicare HMO | Source: Ambulatory Visit | Attending: Psychiatry | Admitting: Psychiatry

## 2023-08-31 DIAGNOSIS — D271 Benign neoplasm of left ovary: Secondary | ICD-10-CM | POA: Diagnosis not present

## 2023-08-31 DIAGNOSIS — Z01818 Encounter for other preprocedural examination: Secondary | ICD-10-CM

## 2023-08-31 DIAGNOSIS — F17200 Nicotine dependence, unspecified, uncomplicated: Secondary | ICD-10-CM | POA: Diagnosis not present

## 2023-08-31 DIAGNOSIS — N189 Chronic kidney disease, unspecified: Secondary | ICD-10-CM | POA: Insufficient documentation

## 2023-08-31 DIAGNOSIS — E1122 Type 2 diabetes mellitus with diabetic chronic kidney disease: Secondary | ICD-10-CM | POA: Diagnosis not present

## 2023-08-31 DIAGNOSIS — N855 Inversion of uterus: Secondary | ICD-10-CM | POA: Diagnosis not present

## 2023-08-31 DIAGNOSIS — D270 Benign neoplasm of right ovary: Secondary | ICD-10-CM | POA: Diagnosis not present

## 2023-08-31 DIAGNOSIS — J449 Chronic obstructive pulmonary disease, unspecified: Secondary | ICD-10-CM | POA: Diagnosis not present

## 2023-08-31 DIAGNOSIS — I129 Hypertensive chronic kidney disease with stage 1 through stage 4 chronic kidney disease, or unspecified chronic kidney disease: Secondary | ICD-10-CM | POA: Insufficient documentation

## 2023-08-31 DIAGNOSIS — I10 Essential (primary) hypertension: Secondary | ICD-10-CM | POA: Insufficient documentation

## 2023-08-31 DIAGNOSIS — N858 Other specified noninflammatory disorders of uterus: Secondary | ICD-10-CM

## 2023-08-31 DIAGNOSIS — K219 Gastro-esophageal reflux disease without esophagitis: Secondary | ICD-10-CM | POA: Insufficient documentation

## 2023-08-31 DIAGNOSIS — E1151 Type 2 diabetes mellitus with diabetic peripheral angiopathy without gangrene: Secondary | ICD-10-CM | POA: Insufficient documentation

## 2023-08-31 DIAGNOSIS — M1990 Unspecified osteoarthritis, unspecified site: Secondary | ICD-10-CM | POA: Insufficient documentation

## 2023-08-31 DIAGNOSIS — N950 Postmenopausal bleeding: Secondary | ICD-10-CM | POA: Diagnosis not present

## 2023-08-31 DIAGNOSIS — N83291 Other ovarian cyst, right side: Secondary | ICD-10-CM | POA: Diagnosis not present

## 2023-08-31 DIAGNOSIS — N183 Chronic kidney disease, stage 3 unspecified: Secondary | ICD-10-CM

## 2023-08-31 DIAGNOSIS — N83202 Unspecified ovarian cyst, left side: Secondary | ICD-10-CM | POA: Diagnosis not present

## 2023-08-31 DIAGNOSIS — Z7951 Long term (current) use of inhaled steroids: Secondary | ICD-10-CM | POA: Insufficient documentation

## 2023-08-31 DIAGNOSIS — D398 Neoplasm of uncertain behavior of other specified female genital organs: Secondary | ICD-10-CM | POA: Diagnosis not present

## 2023-08-31 DIAGNOSIS — N9489 Other specified conditions associated with female genital organs and menstrual cycle: Secondary | ICD-10-CM

## 2023-08-31 DIAGNOSIS — R1909 Other intra-abdominal and pelvic swelling, mass and lump: Secondary | ICD-10-CM | POA: Diagnosis not present

## 2023-08-31 DIAGNOSIS — R978 Other abnormal tumor markers: Secondary | ICD-10-CM

## 2023-08-31 DIAGNOSIS — D3912 Neoplasm of uncertain behavior of left ovary: Secondary | ICD-10-CM | POA: Diagnosis not present

## 2023-08-31 DIAGNOSIS — Z7984 Long term (current) use of oral hypoglycemic drugs: Secondary | ICD-10-CM | POA: Insufficient documentation

## 2023-08-31 HISTORY — PX: DILATATION & CURETTAGE/HYSTEROSCOPY WITH MYOSURE: SHX6511

## 2023-08-31 HISTORY — PX: ROBOTIC ASSISTED BILATERAL SALPINGO OOPHERECTOMY: SHX6078

## 2023-08-31 LAB — TYPE AND SCREEN
ABO/RH(D): B POS
Antibody Screen: POSITIVE

## 2023-08-31 LAB — COMPREHENSIVE METABOLIC PANEL
ALT: 17 U/L (ref 0–44)
AST: 16 U/L (ref 15–41)
Albumin: 3.6 g/dL (ref 3.5–5.0)
Alkaline Phosphatase: 73 U/L (ref 38–126)
Anion gap: 11 (ref 5–15)
BUN: 22 mg/dL (ref 8–23)
CO2: 20 mmol/L — ABNORMAL LOW (ref 22–32)
Calcium: 9 mg/dL (ref 8.9–10.3)
Chloride: 105 mmol/L (ref 98–111)
Creatinine, Ser: 1.03 mg/dL — ABNORMAL HIGH (ref 0.44–1.00)
GFR, Estimated: >60 mL/min (ref >60)
Glucose, Bld: 183 mg/dL — ABNORMAL HIGH (ref 70–99)
Potassium: 3.3 mmol/L — ABNORMAL LOW (ref 3.5–5.1)
Sodium: 136 mmol/L (ref 135–145)
Total Bilirubin: 0.5 mg/dL (ref <1.2)
Total Protein: 7.7 g/dL (ref 6.5–8.1)

## 2023-08-31 LAB — CBC
HCT: 34.3 % — ABNORMAL LOW (ref 36.0–46.0)
Hemoglobin: 9.9 g/dL — ABNORMAL LOW (ref 12.0–15.0)
MCH: 26.4 pg (ref 26.0–34.0)
MCHC: 28.9 g/dL — ABNORMAL LOW (ref 30.0–36.0)
MCV: 91.5 fL (ref 80.0–100.0)
Platelets: 288 10*3/uL (ref 150–400)
RBC: 3.75 MIL/uL — ABNORMAL LOW (ref 3.87–5.11)
RDW: 16.6 % — ABNORMAL HIGH (ref 11.5–15.5)
WBC: 7.4 10*3/uL (ref 4.0–10.5)
nRBC: 0 % (ref 0.0–0.2)

## 2023-08-31 LAB — GLUCOSE, CAPILLARY
Glucose-Capillary: 137 mg/dL — ABNORMAL HIGH (ref 70–99)
Glucose-Capillary: 162 mg/dL — ABNORMAL HIGH (ref 70–99)

## 2023-08-31 SURGERY — SALPINGO-OOPHORECTOMY, BILATERAL, ROBOT-ASSISTED
Anesthesia: General

## 2023-08-31 MED ORDER — LIDOCAINE HCL (CARDIAC) PF 100 MG/5ML IV SOSY
PREFILLED_SYRINGE | INTRAVENOUS | Status: DC | PRN
  Administered 2023-08-31: 60 mg via INTRAVENOUS

## 2023-08-31 MED ORDER — PROPOFOL 10 MG/ML IV BOLUS
INTRAVENOUS | Status: AC
  Filled 2023-08-31: qty 20

## 2023-08-31 MED ORDER — PROPOFOL 1000 MG/100ML IV EMUL
INTRAVENOUS | Status: AC
  Filled 2023-08-31: qty 100

## 2023-08-31 MED ORDER — ACETAMINOPHEN 500 MG PO TABS: 1000.0000 mg | ORAL_TABLET | ORAL | Status: DC

## 2023-08-31 MED ORDER — ORAL CARE MOUTH RINSE: 15.0000 mL | Freq: Once | OROMUCOSAL | Status: AC

## 2023-08-31 MED ORDER — ONDANSETRON HCL 4 MG/2ML IJ SOLN
INTRAMUSCULAR | Status: DC | PRN
  Administered 2023-08-31: 4 mg via INTRAVENOUS

## 2023-08-31 MED ORDER — ONDANSETRON HCL 4 MG/2ML IJ SOLN
INTRAMUSCULAR | Status: AC
  Filled 2023-08-31: qty 2

## 2023-08-31 MED ORDER — ACETAMINOPHEN 500 MG PO TABS
1000.0000 mg | ORAL_TABLET | Freq: Once | ORAL | Status: AC
Start: 2023-08-31 — End: 2023-08-31
  Administered 2023-08-31: 1000 mg via ORAL
  Filled 2023-08-31: qty 2

## 2023-08-31 MED ORDER — LACTATED RINGERS IR SOLN
Status: DC | PRN
  Administered 2023-08-31: 1000 mL

## 2023-08-31 MED ORDER — MIDAZOLAM HCL 5 MG/5ML IJ SOLN
INTRAMUSCULAR | Status: DC | PRN
  Administered 2023-08-31 (×2): 1 mg via INTRAVENOUS

## 2023-08-31 MED ORDER — STERILE WATER FOR IRRIGATION IR SOLN
Status: DC | PRN
  Administered 2023-08-31: 1000 mL

## 2023-08-31 MED ORDER — SODIUM CHLORIDE 0.9 % IR SOLN
Status: DC | PRN
  Administered 2023-08-31: 3000 mL

## 2023-08-31 MED ORDER — LACTATED RINGERS IV SOLN: INTRAVENOUS | Status: DC

## 2023-08-31 MED ORDER — SUCCINYLCHOLINE CHLORIDE 200 MG/10ML IV SOSY
PREFILLED_SYRINGE | INTRAVENOUS | Status: AC
  Filled 2023-08-31: qty 10

## 2023-08-31 MED ORDER — ROCURONIUM BROMIDE 100 MG/10ML IV SOLN
INTRAVENOUS | Status: DC | PRN
  Administered 2023-08-31: 70 mg via INTRAVENOUS
  Administered 2023-08-31: 10 mg via INTRAVENOUS

## 2023-08-31 MED ORDER — KETAMINE HCL 10 MG/ML IJ SOLN
INTRAMUSCULAR | Status: DC | PRN
  Administered 2023-08-31: 40 mg via INTRAVENOUS
  Administered 2023-08-31: 10 mg via INTRAVENOUS

## 2023-08-31 MED ORDER — INSULIN ASPART 100 UNIT/ML IJ SOLN: 0.0000 [IU] | INTRAMUSCULAR | Status: DC | PRN

## 2023-08-31 MED ORDER — BUPIVACAINE HCL (PF) 0.25 % IJ SOLN
INTRAMUSCULAR | Status: AC
  Filled 2023-08-31: qty 30

## 2023-08-31 MED ORDER — KETAMINE HCL 50 MG/5ML IJ SOSY
PREFILLED_SYRINGE | INTRAMUSCULAR | Status: AC
  Filled 2023-08-31: qty 5

## 2023-08-31 MED ORDER — MIDAZOLAM HCL 2 MG/2ML IJ SOLN
INTRAMUSCULAR | Status: AC
  Filled 2023-08-31: qty 2

## 2023-08-31 MED ORDER — ROCURONIUM BROMIDE 10 MG/ML (PF) SYRINGE
PREFILLED_SYRINGE | INTRAVENOUS | Status: AC
  Filled 2023-08-31: qty 10

## 2023-08-31 MED ORDER — HYDROMORPHONE HCL 1 MG/ML IJ SOLN
0.2500 mg | INTRAMUSCULAR | Status: DC | PRN
  Administered 2023-08-31 (×4): 0.5 mg via INTRAVENOUS

## 2023-08-31 MED ORDER — FENTANYL CITRATE (PF) 100 MCG/2ML IJ SOLN
INTRAMUSCULAR | Status: AC
  Filled 2023-08-31: qty 2

## 2023-08-31 MED ORDER — LIDOCAINE HCL (PF) 1 % IJ SOLN
INTRAMUSCULAR | Status: AC
  Filled 2023-08-31: qty 30

## 2023-08-31 MED ORDER — LIDOCAINE HCL (PF) 2 % IJ SOLN
INTRAMUSCULAR | Status: DC | PRN
  Administered 2023-08-31: 1.5 mg/kg/h via INTRADERMAL

## 2023-08-31 MED ORDER — DEXAMETHASONE SODIUM PHOSPHATE 4 MG/ML IJ SOLN
4.0000 mg | INTRAMUSCULAR | Status: AC
  Administered 2023-08-31: 4 mg via INTRAVENOUS

## 2023-08-31 MED ORDER — LIDOCAINE HCL (PF) 2 % IJ SOLN
INTRAMUSCULAR | Status: AC
  Filled 2023-08-31: qty 5

## 2023-08-31 MED ORDER — PROPOFOL 500 MG/50ML IV EMUL
INTRAVENOUS | Status: DC | PRN
  Administered 2023-08-31: 75 ug/kg/min via INTRAVENOUS

## 2023-08-31 MED ORDER — AMISULPRIDE (ANTIEMETIC) 5 MG/2ML IV SOLN
10.0000 mg | Freq: Once | INTRAVENOUS | Status: AC
  Administered 2023-08-31: 10 mg via INTRAVENOUS

## 2023-08-31 MED ORDER — OXYCODONE HCL 5 MG PO TABS
5.0000 mg | ORAL_TABLET | Freq: Once | ORAL | Status: AC
  Administered 2023-08-31: 5 mg via ORAL

## 2023-08-31 MED ORDER — LIDOCAINE HCL 2 % IJ SOLN
INTRAMUSCULAR | Status: AC
  Filled 2023-08-31: qty 20

## 2023-08-31 MED ORDER — PHENYLEPHRINE HCL-NACL 20-0.9 MG/250ML-% IV SOLN
INTRAVENOUS | Status: DC | PRN
  Administered 2023-08-31: 35 ug/min via INTRAVENOUS

## 2023-08-31 MED ORDER — AMISULPRIDE (ANTIEMETIC) 5 MG/2ML IV SOLN
INTRAVENOUS | Status: AC
  Filled 2023-08-31: qty 4

## 2023-08-31 MED ORDER — NYSTATIN 100000 UNIT/GM EX CREA: 1.0000 | TOPICAL_CREAM | Freq: Two times a day (BID) | CUTANEOUS | 0 refills | Status: DC

## 2023-08-31 MED ORDER — CHLORHEXIDINE GLUCONATE 0.12 % MT SOLN
15.0000 mL | Freq: Once | OROMUCOSAL | Status: AC
  Administered 2023-08-31: 15 mL via OROMUCOSAL

## 2023-08-31 MED ORDER — DEXAMETHASONE SODIUM PHOSPHATE 10 MG/ML IJ SOLN
INTRAMUSCULAR | Status: AC
Start: 2023-08-31 — End: ?
  Filled 2023-08-31: qty 1

## 2023-08-31 MED ORDER — PROPOFOL 10 MG/ML IV BOLUS
INTRAVENOUS | Status: DC | PRN
  Administered 2023-08-31: 50 mg via INTRAVENOUS
  Administered 2023-08-31: 100 mg via INTRAVENOUS
  Administered 2023-08-31: 30 mg via INTRAVENOUS

## 2023-08-31 MED ORDER — OXYCODONE HCL 5 MG PO TABS
ORAL_TABLET | ORAL | Status: AC
  Filled 2023-08-31: qty 1

## 2023-08-31 MED ORDER — BUPIVACAINE HCL 0.25 % IJ SOLN
INTRAMUSCULAR | Status: DC | PRN
  Administered 2023-08-31: 25 mL

## 2023-08-31 MED ORDER — PROPOFOL 500 MG/50ML IV EMUL
INTRAVENOUS | Status: AC
Start: 2023-08-31 — End: ?
  Filled 2023-08-31: qty 50

## 2023-08-31 MED ORDER — HYDROMORPHONE HCL 1 MG/ML IJ SOLN
INTRAMUSCULAR | Status: AC
  Filled 2023-08-31: qty 2

## 2023-08-31 MED ORDER — FENTANYL CITRATE (PF) 100 MCG/2ML IJ SOLN
INTRAMUSCULAR | Status: DC | PRN
  Administered 2023-08-31 (×6): 50 ug via INTRAVENOUS

## 2023-08-31 MED ORDER — SUGAMMADEX SODIUM 200 MG/2ML IV SOLN
INTRAVENOUS | Status: DC | PRN
  Administered 2023-08-31: 200 mg via INTRAVENOUS

## 2023-08-31 MED ORDER — SODIUM CHLORIDE 0.9% FLUSH: 3.0000 mL | Freq: Two times a day (BID) | INTRAVENOUS | Status: DC

## 2023-08-31 MED ORDER — STERILE WATER FOR INJECTION IJ SOLN
INTRAMUSCULAR | Status: AC
  Filled 2023-08-31: qty 10

## 2023-08-31 MED ORDER — HEPARIN SODIUM (PORCINE) 5000 UNIT/ML IJ SOLN
5000.0000 [IU] | INTRAMUSCULAR | Status: AC
  Administered 2023-08-31: 5000 [IU] via SUBCUTANEOUS
  Filled 2023-08-31: qty 1

## 2023-08-31 SURGICAL SUPPLY — 91 items
APPLICATOR SURGIFLO ENDO (HEMOSTASIS) IMPLANT
BAG COUNTER SPONGE SURGICOUNT (BAG) ×2 IMPLANT
BAG LAPAROSCOPIC 12 15 PORT 16 (BASKET) IMPLANT
BAG RETRIEVAL 12/15 (BASKET) ×2
BLADE SURG SZ10 CARB STEEL (BLADE) IMPLANT
CATH ROBINSON RED A/P 16FR (CATHETERS) ×2 IMPLANT
COVER BACK TABLE 60X90IN (DRAPES) ×2 IMPLANT
COVER TIP SHEARS 8 DVNC (MISCELLANEOUS) ×2 IMPLANT
DERMABOND ADVANCED .7 DNX12 (GAUZE/BANDAGES/DRESSINGS) ×2 IMPLANT
DEVICE MYOSURE LITE (MISCELLANEOUS) IMPLANT
DEVICE MYOSURE REACH (MISCELLANEOUS) IMPLANT
DILATOR CANAL MILEX (MISCELLANEOUS) IMPLANT
DRAPE ARM DVNC X/XI (DISPOSABLE) ×8 IMPLANT
DRAPE COLUMN DVNC XI (DISPOSABLE) ×2 IMPLANT
DRAPE HYSTEROSCOPY (MISCELLANEOUS) IMPLANT
DRAPE SHEET LG 3/4 BI-LAMINATE (DRAPES) ×2 IMPLANT
DRAPE SURG IRRIG POUCH 19X23 (DRAPES) ×2 IMPLANT
DRIVER NDL MEGA SUTCUT DVNCXI (INSTRUMENTS) ×2 IMPLANT
DRIVER NDLE MEGA SUTCUT DVNCXI (INSTRUMENTS)
DRSG OPSITE POSTOP 4X6 (GAUZE/BANDAGES/DRESSINGS) IMPLANT
DRSG OPSITE POSTOP 4X8 (GAUZE/BANDAGES/DRESSINGS) IMPLANT
ELECT PENCIL ROCKER SW 15FT (MISCELLANEOUS) IMPLANT
ELECT REM PT RETURN 15FT ADLT (MISCELLANEOUS) ×2 IMPLANT
FORCEPS BPLR FENES DVNC XI (FORCEP) ×2 IMPLANT
FORCEPS PROGRASP DVNC XI (FORCEP) ×2 IMPLANT
GAUZE 4X4 16PLY ~~LOC~~+RFID DBL (SPONGE) ×2 IMPLANT
GLOVE BIO SURGEON STRL SZ 6 (GLOVE) ×8 IMPLANT
GLOVE BIO SURGEON STRL SZ 6.5 (GLOVE) ×2 IMPLANT
GLOVE BIOGEL PI IND STRL 6.5 (GLOVE) ×4 IMPLANT
GOWN STRL REUS W/ TWL LRG LVL3 (GOWN DISPOSABLE) ×8 IMPLANT
GRASPER SUT TROCAR 14GX15 (MISCELLANEOUS) IMPLANT
HOLDER FOLEY CATH W/STRAP (MISCELLANEOUS) IMPLANT
IRRIG SUCT STRYKERFLOW 2 WTIP (MISCELLANEOUS) ×2
IRRIGATION SUCT STRKRFLW 2 WTP (MISCELLANEOUS) ×2 IMPLANT
IV NS IRRIG 3000ML ARTHROMATIC (IV SOLUTION) ×2 IMPLANT
KIT PROCEDURE DVNC SI (MISCELLANEOUS) IMPLANT
KIT PROCEDURE FLUENT (KITS) IMPLANT
KIT TURNOVER KIT A (KITS) IMPLANT
LIGASURE IMPACT 36 18CM CVD LR (INSTRUMENTS) IMPLANT
LOOP CUTTING BIPOLAR 21FR (ELECTRODE) IMPLANT
MANIPULATOR ADVINCU DEL 3.0 PL (MISCELLANEOUS) IMPLANT
MANIPULATOR ADVINCU DEL 3.5 PL (MISCELLANEOUS) IMPLANT
MANIPULATOR UTERINE 4.5 ZUMI (MISCELLANEOUS) IMPLANT
MYOSURE XL FIBROID (MISCELLANEOUS)
NDL HYPO 21X1.5 SAFETY (NEEDLE) ×2 IMPLANT
NDL INSUFFLATION 14GA 120MM (NEEDLE) IMPLANT
NDL SPNL 20GX3.5 QUINCKE YW (NEEDLE) IMPLANT
NEEDLE HYPO 21X1.5 SAFETY (NEEDLE) ×2
NEEDLE INSUFFLATION 14GA 120MM (NEEDLE)
NEEDLE SPNL 20GX3.5 QUINCKE YW (NEEDLE)
OBTURATOR OPTICAL STND 8 DVNC (TROCAR) ×2
OBTURATOR OPTICALSTD 8 DVNC (TROCAR) ×2 IMPLANT
PACK LITHOTOMY IV (CUSTOM PROCEDURE TRAY) ×2 IMPLANT
PACK ROBOT GYN CUSTOM WL (TRAY / TRAY PROCEDURE) ×2 IMPLANT
PACK VAGINAL WOMENS (CUSTOM PROCEDURE TRAY) ×2 IMPLANT
PAD ARMBOARD 7.5X6 YLW CONV (MISCELLANEOUS) ×2 IMPLANT
PAD OB MATERNITY 11 LF (PERSONAL CARE ITEMS) IMPLANT
PAD POSITIONING PINK XL (MISCELLANEOUS) ×2 IMPLANT
PORT ACCESS TROCAR AIRSEAL 12 (TROCAR) IMPLANT
SCISSORS LAP 5X35 DISP (ENDOMECHANICALS) IMPLANT
SCISSORS MNPLR CVD DVNC XI (INSTRUMENTS) ×2 IMPLANT
SCRUB CHG 4% DYNA-HEX 4OZ (MISCELLANEOUS) ×4 IMPLANT
SEAL ROD LENS SCOPE MYOSURE (ABLATOR) IMPLANT
SEAL UNIV 5-12 XI (MISCELLANEOUS) ×8 IMPLANT
SET TRI-LUMEN FLTR TB AIRSEAL (TUBING) ×2 IMPLANT
SPIKE FLUID TRANSFER (MISCELLANEOUS) ×2 IMPLANT
SPONGE T-LAP 18X18 ~~LOC~~+RFID (SPONGE) IMPLANT
SURGIFLO W/THROMBIN 8M KIT (HEMOSTASIS) IMPLANT
SUT MNCRL AB 4-0 PS2 18 (SUTURE) IMPLANT
SUT PDS AB 1 TP1 54 (SUTURE) IMPLANT
SUT VIC AB 0 CT1 27XBRD ANTBC (SUTURE) IMPLANT
SUT VIC AB 2-0 CT1 TAPERPNT 27 (SUTURE) IMPLANT
SUT VIC AB 4-0 PS2 18 (SUTURE) ×4 IMPLANT
SUT VIC AB 4-0 PS2 27 (SUTURE) IMPLANT
SUT VICRYL 0 27 CT2 27 ABS (SUTURE) ×2 IMPLANT
SUT VLOC 180 0 9IN GS21 (SUTURE) IMPLANT
SYR 10ML LL (SYRINGE) IMPLANT
SYS BAG RETRIEVAL 10MM (BASKET)
SYS WOUND ALEXIS 18CM MED (MISCELLANEOUS)
SYSTEM BAG RETRIEVAL 10MM (BASKET) IMPLANT
SYSTEM TISS REMOVAL MYOSURE XL (MISCELLANEOUS) IMPLANT
SYSTEM WOUND ALEXIS 18CM MED (MISCELLANEOUS) IMPLANT
TOWEL OR 17X26 10 PK STRL BLUE (TOWEL DISPOSABLE) ×2 IMPLANT
TOWEL OR NON WOVEN STRL DISP B (DISPOSABLE) IMPLANT
TRAP SPECIMEN MUCUS 40CC (MISCELLANEOUS) IMPLANT
TRAY FOLEY MTR SLVR 16FR STAT (SET/KITS/TRAYS/PACK) ×2 IMPLANT
TROCAR PORT AIRSEAL 5X120 (TROCAR) IMPLANT
UNDERPAD 30X36 HEAVY ABSORB (UNDERPADS AND DIAPERS) ×4 IMPLANT
WATER STERILE IRR 1000ML POUR (IV SOLUTION) ×2 IMPLANT
WATER STERILE IRR 500ML POUR (IV SOLUTION) ×2 IMPLANT
YANKAUER SUCT BULB TIP 10FT TU (MISCELLANEOUS) IMPLANT

## 2023-08-31 NOTE — Transfer of Care (Signed)
Immediate Anesthesia Transfer of Care Note  Patient: Alexandra Campos  Procedure(s) Performed: XI ROBOTIC ASSISTED BILATERAL SALPINGO OOPHORECTOMY (Bilateral) DILATATION & CURETTAGE/HYSTEROSCOPY WITH MYOSURE  Patient Location: PACU  Anesthesia Type:General  Level of Consciousness: drowsy and patient cooperative  Airway & Oxygen Therapy: Patient Spontanous Breathing and Patient connected to face mask oxygen  Post-op Assessment: Report given to RN and Post -op Vital signs reviewed and stable  Post vital signs: Reviewed and stable  Last Vitals:  Vitals Value Taken Time  BP 156/98 08/31/23 1103  Temp    Pulse 83 08/31/23 1108  Resp 27 08/31/23 1108  SpO2 100 % 08/31/23 1108  Vitals shown include unfiled device data.  Last Pain:  Vitals:   08/31/23 0643  TempSrc: Oral  PainSc: 0-No pain      Patients Stated Pain Goal: 4 (08/31/23 8657)  Complications: No notable events documented.

## 2023-08-31 NOTE — Op Note (Signed)
GYNECOLOGIC ONCOLOGY OPERATIVE NOTE  Date of Service: 08/31/2023  Preoperative Diagnosis: Adnexal mass, elevated tumor markers, postmenopausal bleeding  Postoperative Diagnosis: Right ovarian dermoid cyst, elevated tumor markers, postmenopausal bleeding  Procedures: Robotic assisted bilateral salpingo-oophorectomy, hysteroscopy dilation and curettage with MyoSure  Surgeon: Clide Cliff, MD  Assistants: Antionette Char, MD and (an MD assistant was necessary for tissue manipulation, management of robotic instrumentation, retraction and positioning due to the complexity of the case and hospital policies)  Anesthesia: General  Estimated Blood Loss: 5 mL    Fluids: see anesthesia record  Urine Output: 150 ml, clear yellow  Fluid Deficit: 150 ml  Findings: On entry to abdomen, upper abdominal survey with thin filmy adhesions of the right liver to the diaphragm.  Adhesions of the omentum to the midline anterior abdominal wall around the level of the umbilicus.  Otherwise normal-appearing diaphragm, liver, stomach, omentum, and bowel.  In pelvis, enlarged approximately 7 cm cystic right ovarian mass.  On removal from abdomen in bag, thick yellow contents consistent with likely dermoid cyst.  Normal-appearing appendix adherent to the right pelvic sidewall and IP ligament.  Atrophic appearing left ovary.  Bilateral fallopian tubes with evidence of prior tubal ligation.  Thin filmy adhesions in the pelvis of the uterus to the bladder and pelvic sidewalls.  Small uterus with multiple 1 to 2 cm pedunculated fibroids.  On hysteroscopy, predominantly atrophic appearing endometrium.  Some proliferation near the right tubal ostia.  Possible small submucosal fibroid at the fundus. IOFS c/w benign cyst, likely dermoid.   Specimens:  ID Type Source Tests Collected by Time Destination  1 : Bilateral tubes/ovaries (Right ovary frozen) Tissue PATH Gyn benign resection SURGICAL PATHOLOGY Clide Cliff,  MD 08/31/2023 0945   2 : endometrial curettings Tissue PATH Gyn biopsy SURGICAL PATHOLOGY Clide Cliff, MD 08/31/2023 1019   A : Pelvic washings Body Fluid PATH Cytology Pelvic Washing CYTOLOGY - NON PAP Clide Cliff, MD 08/31/2023 [phone]     Complications:  None  Indications for Procedure: Alexandra Campos is a 67 y.o. woman with a 7 cm adnexal mass likely consistent with a dermoid but with an elevated HE4 and postmenopausal ROMA score.  Additionally, history of postmenopausal bleeding with superficial sampling on prior endometrial biopsy..  Prior to the procedure, all risks, benefits, and alternatives were discussed and informed surgical consent was signed.  Procedure: Patient was taken to the operating room where general anesthesia was achieved.  She was positioned in dorsal lithotomy and prepped and draped.  A foley catheter was inserted into the bladder.    A 12 mm incision was made in the left upper quadrant near Palmer's point.  The abdomen was entered with a 5 mm OptiView trocar under direct visualization.  The abdomen was insufflated, the patient placed in steep Trendelenburg, and additional trocars were placed as follows: one 8 mm robotic trocar in the right abdomen, and one 8 mm robotic trocar in the left abdomen.  Adhesions of the omentum to the midline anterior abdominal wall were lysed with sharp dissection and electrocautery.  With the abdominal wall now clear, 1 additional 8 mm robotic trocar was placed in the midline superior to the umbilicus. The left upper quadrant trocar was removed and replaced with a 12 mm airseal trocar.  All trocars were placed under direct visualization.  The bowels were moved into the upper abdomen.  The DaVinci robotic surgical system was brought to the patient's bedside and docked.  The peritoneum overlying the right pelvic sidewall was  incised and the right retroperitoneum entered.  The right ureter was identified.  Adhesions of the appendix to the right  infundibulopelvic ligament were lysed sharply with scissors, and the appendix was mobilized off of the right pelvic sidewall.  The right infundibulopelvic ligament was isolated, cauterized, and transected.  The broad ligament was incised to the uterine cornu.  The utero-ovarian ligament and the proximal fallopian tube were isolated, cauterized, and transected.  The same procedure was performed on the contralateral side.  Both specimens were placed into an Endo-Catch bag and removed through the 12 mm trocar with control drainage of the cystic mass within the bag.  Attention was then turned to the hysteroscopy portion.  A speculum was placed in the vagina.  A tenaculum was placed on the anterior lip of the cervix.  The cervix was dilated serially with Shawnie Pons dilators to 15 [place].  The hysteroscopy was inserted into the uterine cavity with the findings as noted above.  The MyoSure lite device was inserted and the proliferative tissue from the right cornua was removed and a general endometrial sampling was collected with the MyoSure device.  The hysteroscopy and MyoSure were then removed.  The tenaculum was removed from the cervix with hemostasis noted.  Returning to the robot, the pelvis was irrigated and all operative sites were found to be hemostatic.  All instruments were removed and the robot was taken from the patient's bedside. The fascia at the 12 mm incision was closed with 0 Vicryl with the assistance of a PMI device. The abdomen was desufflated and all ports were removed. The skin at all incisions was closed with 4-0 Vicryl to reapproximate the subcutaneous tissue and 4-0 monocryl in a subcuticular fashion followed by surgical glue.  Patient tolerated the procedure well. Sponge, lap, and instrument counts were correct.  No perioperative antibiotic prophylaxis was indicated for this procedure.  She was extubated and taken to the PACU in stable condition.  Clide Cliff, MD Gynecologic Oncology

## 2023-08-31 NOTE — Anesthesia Procedure Notes (Signed)
Procedure Name: Intubation Date/Time: 08/31/2023 8:37 AM  Performed by: Maurene Capes, CRNAPre-anesthesia Checklist: Patient identified, Emergency Drugs available, Suction available and Patient being monitored Patient Re-evaluated:Patient Re-evaluated prior to induction Oxygen Delivery Method: Circle System Utilized Preoxygenation: Pre-oxygenation with 100% oxygen Induction Type: IV induction Ventilation: Mask ventilation without difficulty Laryngoscope Size: Mac Grade View: Grade II Tube type: Oral Tube size: 7.5 mm Number of attempts: 1 Airway Equipment and Method: Stylet Placement Confirmation: ETT inserted through vocal cords under direct vision, positive ETCO2 and breath sounds checked- equal and bilateral Tube secured with: Tape Dental Injury: Teeth and Oropharynx as per pre-operative assessment

## 2023-08-31 NOTE — Discharge Instructions (Addendum)
AFTER SURGERY INSTRUCTIONS   Return to work: 4-6 weeks if applicable   Activity: 1. Be up and out of the bed during the day.  Take a nap if needed.  You may walk up steps but be careful and use the hand rail.  Stair climbing will tire you more than you think, you may need to stop part way and rest.    2. No lifting or straining for 6 weeks over 10 pounds. No pushing, pulling, straining for 6 weeks.   3. No driving for around 8-29 days when the following have been met: Do not drive if you are taking narcotic pain medicine and make sure that your reaction time has returned.    4. You can shower as soon as the next day after surgery. Shower daily.  Use your regular soap and water (not directly on the incision) and pat your incision(s) dry afterwards; don't rub.  No tub baths or submerging your body in water until cleared by your surgeon. If you have the soap that was given to you by pre-surgical testing that was used before surgery, you do not need to use it afterwards because this can irritate your incisions.    5. No sexual activity and nothing in the vagina for 6 weeks.   6. You may experience a small amount of clear drainage from your incisions, which is normal.  If the drainage persists, increases, or changes color please call the office.   7. Do not use creams, lotions, or ointments such as neosporin on your incisions after surgery until advised by your surgeon because they can cause removal of the dermabond glue on your incisions.     8. You may experience vaginal spotting after surgery. The spotting is normal but if you experience heavy bleeding, call our office.   9. Take Tylenol first for pain if you are able to take these medication and only use narcotic pain medication for severe pain not relieved by the Tylenol.  Monitor your Tylenol intake to a max of 4,000 mg in a 24 hour period.   Diet: 1. Low sodium Heart Healthy Diet is recommended but you are cleared to resume your normal  (before surgery) diet after your procedure.   2. It is safe to use a laxative, such as Miralax or Colace, if you have difficulty moving your bowels. You have been prescribed Sennakot-S to take at bedtime every evening after surgery to keep bowel movements regular and to prevent constipation.     Wound Care: 1. Keep clean and dry.  Shower daily.   Reasons to call the Doctor: Fever - Oral temperature greater than 100.4 degrees Fahrenheit Foul-smelling vaginal discharge Difficulty urinating Nausea and vomiting Increased pain at the site of the incision that is unrelieved with pain medicine. Difficulty breathing with or without chest pain New calf pain especially if only on one side Sudden, continuing increased vaginal bleeding with or without clots.   Contacts: For questions or concerns you should contact:   Dr. Clide Cliff at [phone]   [name], NP at [phone]   After Hours: call [phone] and have the GYN Oncologist paged/contacted (after 5 pm or on the weekends). You will speak with an after hours RN and let he or she know you have had surgery.   Messages sent via mychart are for non-urgent matters and are not responded to after hours so for urgent needs, please call the after hours number.

## 2023-08-31 NOTE — H&P (Signed)
Brief Pre-operative History & Physical  Patient name: Alexandra Campos CSN: [id number] MRN: [id number] Admit Date: 08/31/2023 Date of Surgery: 08/31/2023 Performing Service: Gynecology   Code Status: Full Code    Assessment & Plan    Omera is a 67 y.o. female with ADNEXAL MASS, ELEVATED MARKERS, who presents for: Procedure(s) (LRB): XI ROBOTIC ASSISTED BILATERAL SALPINGO OOPHORECTOMY (Bilateral) DILATATION & CURETTAGE/HYSTEROSCOPY WITH MYOSURE (N/A) POSSIBLE XI ROBOTIC ASSISTED TOTAL HYSTERECTOMY (N/A) POSSIBLE STAGING (N/A).   Consent obtained in office is accurate. Risks, benefits, and alternatives to surgery were reviewed, and all questions were answered.  Proceed to the OR as planned.     History of Present Illness:  Alexandra Campos is a 67 y.o. female with ADNEXAL MASS, ELEVATED MARKERS. She was recently seen in clinic, where a detailed HPI can be found. She was noted to benefit from: Procedure(s) (LRB): XI ROBOTIC ASSISTED BILATERAL SALPINGO OOPHORECTOMY (Bilateral) DILATATION & CURETTAGE/HYSTEROSCOPY WITH MYOSURE (N/A) POSSIBLE XI ROBOTIC ASSISTED TOTAL HYSTERECTOMY (N/A) POSSIBLE STAGING (N/A).    Allergies Patient has no known allergies.  Medications   Current Facility-Administered Medications  Medication Dose Route Frequency Provider Last Rate Last Admin   dexamethasone (DECADRON) injection 4 mg  4 mg Intravenous On Call to OR Cross, Melissa D, NP       insulin aspart (novoLOG) injection 0-14 Units  0-14 Units Subcutaneous Q2H PRN Gaynelle Adu, MD       lactated ringers infusion   Intravenous Continuous Beryle Lathe, MD 10 mL/hr at 08/31/23 0649 New Bag at 08/31/23 0649    Vital Signs BP (!) 154/83   Pulse 88   Temp 97.9 F (36.6 C) (Oral)   Resp 18   Ht 5' (1.524 m)   Wt 195 lb 12.8 oz (88.8 kg)   SpO2 95%   BMI 38.24 kg/m  Facility age limit for growth %iles is 20 years. Facility age limit for growth %iles is 20 years..   Physical  Exam General: Well developed, appears stated age, in no acute distress  Mental status: Alert and oriented x3 Cardiovascular: Normal Pulmonary: Symmetric chest rise, unlabored breathing Relevant System for Surgery: Surgical site examination deferred to the OR   Labs and Studies: Lab Results  Component Value Date   WBC 6.8 08/16/2023   HGB 10.0 (L) 08/16/2023   HCT 32.6 (L) 08/16/2023   PLT 296 08/16/2023    No results found for: "INR", "APTT" \

## 2023-08-31 NOTE — Anesthesia Postprocedure Evaluation (Signed)
Anesthesia Post Note  Patient: Alexandra Campos  Procedure(s) Performed: XI ROBOTIC ASSISTED BILATERAL SALPINGO OOPHORECTOMY (Bilateral) DILATATION & CURETTAGE/HYSTEROSCOPY WITH MYOSURE     Patient location during evaluation: PACU Anesthesia Type: General Level of consciousness: awake and alert Pain management: pain level controlled Vital Signs Assessment: post-procedure vital signs reviewed and stable Respiratory status: spontaneous breathing, nonlabored ventilation and respiratory function stable Cardiovascular status: blood pressure returned to baseline and stable Postop Assessment: no apparent nausea or vomiting Anesthetic complications: no  No notable events documented.  Last Vitals:  Vitals:   08/31/23 1242 08/31/23 1247  BP:  (!) 155/97  Pulse: 82 80  Resp:    Temp:    SpO2: (!) 89% 94%    Last Pain:  Vitals:   08/31/23 1330  TempSrc:   PainSc: 4                  Prajna Vanderpool,W. EDMOND

## 2023-08-31 NOTE — Telephone Encounter (Signed)
Nystatin cream sent to pharmacy

## 2023-09-01 ENCOUNTER — Telehealth: Payer: Self-pay | Admitting: *Deleted

## 2023-09-01 ENCOUNTER — Encounter (HOSPITAL_COMMUNITY): Payer: Self-pay | Admitting: Psychiatry

## 2023-09-01 ENCOUNTER — Encounter: Payer: Self-pay | Admitting: Student

## 2023-09-01 LAB — BPAM RBC
Blood Product Expiration Date: 202412202359
Unit Type and Rh: 7300

## 2023-09-01 LAB — TYPE AND SCREEN
ABO/RH(D): B POS
Antibody Screen: POSITIVE
Unit division: 0

## 2023-09-01 NOTE — Telephone Encounter (Signed)
Spoke with Alexandra Campos and patient states her pain has improved and she rates her pain 6/10.  Pt has only taken the oxycodone 2 once today, and will take 2 tonight at bedtime and taking tylenol only as needed as well. Reinforced to patient if her pain level is less than 6 she doesn't need to take oxycodone and can try alternating with tylenol tomorrow. Pt verbalized understanding and thanked the office for calling back.

## 2023-09-01 NOTE — Telephone Encounter (Signed)
Spoke with Ms. Marston this morning. She states she is eating, drinking and urinating well. She has not had a BM yet but is passing gas. She is taking senokot as prescribed and encouraged her to drink plenty of water. She denies fever or chills. Incisions are dry and intact. She rates her pain 8/10. And patient called this morning states she has taken the oxycodone twice since 2100 last night and it hasn't helped with pain. Pt advised to take 2 oxycodone every 6 hours and also take her tylenol as prescribed and call the office back if she still has no relief. Pt advised she may just need to take 2 oxycodone initially to get her pain score down and then return taking 1 every 6 hours. Pt encouraged to support her incisions with a pillow and follow all activity restrictions. Pt verbalized understanding.   Instructed to call office with any fever, chills, purulent drainage, uncontrolled pain or any other questions or concerns. Patient verbalizes understanding.   Pt aware of post op appointments as well as the office number [phone] and after hours number [phone] to call if she has any questions or concerns

## 2023-09-02 ENCOUNTER — Telehealth: Payer: Self-pay | Admitting: *Deleted

## 2023-09-02 DIAGNOSIS — N83201 Unspecified ovarian cyst, right side: Secondary | ICD-10-CM

## 2023-09-02 LAB — CYTOLOGY - NON PAP

## 2023-09-02 LAB — SURGICAL PATHOLOGY

## 2023-09-02 NOTE — Telephone Encounter (Signed)
Spoke with Alexandra Campos who called the office stating she is feeling better and has only needed to take 1 oxycodone today. Pt states she only has three left and wanted to see about getting a refill before the weekend just in case she needs more. Pt advised her message would be relayed to providers.

## 2023-09-03 ENCOUNTER — Telehealth: Payer: Self-pay | Admitting: *Deleted

## 2023-09-03 MED ORDER — OXYCODONE HCL 5 MG PO TABS: 5.0000 mg | ORAL_TABLET | Freq: Four times a day (QID) | ORAL | 0 refills | Status: DC | PRN

## 2023-09-03 NOTE — Telephone Encounter (Signed)
Spoke with Ms. Alexandra Campos and informed patient that refill Rx was sent in to her pharmacy. Pt thanked the office for calling.

## 2023-09-13 ENCOUNTER — Inpatient Hospital Stay: Payer: Medicare HMO | Attending: Psychiatry | Admitting: Psychiatry

## 2023-09-13 VITALS — BP 136/72 | HR 86 | Temp 98.3°F | Resp 20 | Wt 189.6 lb

## 2023-09-13 DIAGNOSIS — N83201 Unspecified ovarian cyst, right side: Secondary | ICD-10-CM

## 2023-09-13 DIAGNOSIS — Z90722 Acquired absence of ovaries, bilateral: Secondary | ICD-10-CM

## 2023-09-13 NOTE — Patient Instructions (Signed)
It was a pleasure to see you in clinic today. - Healing well. - Pathology all benign (no cancer) - Okay to return to routine care - Okay to drink in moderation.  Thank you very much for allowing me to provide care for you today.  I appreciate your confidence in choosing our Gynecologic Oncology team at [facility].  If you have any questions about your visit today please call our office or send [place] a MyChart message and we will get back to you as soon as possible.

## 2023-09-13 NOTE — Progress Notes (Unsigned)
Gynecologic Oncology Return Clinic Visit  Date of Service: 09/13/2023 Referring Provider: Lorriane Shire, MD   Assessment & Plan: Alexandra Campos is a 67 y.o. woman with a 7cm adnexal mass with elevated risk ROMA who is s/p RA-BSO, hysteroscopy D&C with myosure on 08/31/23 with benign final pathology.  Postop: - Pt recovering well from surgery and healing appropriately postoperatively - Intraoperative findings and pathology results reviewed. - Ongoing postoperative expectations and precautions reviewed. Continue with no lifting >10lbs through 4 weeks postoperatively - Reviewed that if she were to experience any postmenopausal bleeding, this is abnormal and should be evaluated by her Ob/Gyn.   ***   RTC ***.  Clide Cliff, MD Gynecologic Oncology   Medical Decision Making I personally spent  TOTAL *** minutes face-to-face and non-face-to-face in the care of this patient, which includes all pre, intra, and post visit time on the date of service.    ----------------------- Reason for Visit: Postop***    Interval History: Pt reports that she is recovering well from surgery. She is using tylenol PRN for pain. She is eating and drinking well. She is voiding without issue and having regular bowel movements. Spotting has resolved.   ***  Past Medical/Surgical History: Past Medical History:  Diagnosis Date   Abnormal ankle brachial index (ABI) 01/14/2020   Abnormal facial hair 11/24/2019   Arthritis    Chronic kidney disease    Complex cyst of right ovary 04/09/2020   Likely a dermoid. Normal CA 125     COPD (chronic obstructive pulmonary disease) (HCC)    Diabetes mellitus without complication (HCC)    GERD (gastroesophageal reflux disease)    Hyperlipidemia    Hypertension    Ovarian cyst 03/09/2020   CA-125 17.3     Peripheral vascular disease (HCC)    Postmenopausal bleeding 02/07/2020   Rash 04/20/2022   Right upper quadrant pain 01/21/2022   Stress  incontinence of urine 10/03/2019   Substance abuse (HCC)    ETOH, THC   Wheezing 12/13/2020   Yeast infection involving the vagina and surrounding area 06/10/2020    Past Surgical History:  Procedure Laterality Date   ABDOMINAL AORTOGRAM W/LOWER EXTREMITY N/A 03/15/2020   Procedure: ABDOMINAL AORTOGRAM W/LOWER EXTREMITY;  Surgeon: Chuck Hint, MD;  Location: [facility];  Service: Cardiovascular;  Laterality: N/A;   ANKLE SURGERY Right    ORIF   CARPAL TUNNEL RELEASE Right 11/18/2022   Procedure: RIGHT CARPAL TUNNEL RELEASE;  Surgeon: Tarry Kos, MD;  Location: [facility];  Service: Orthopedics;  Laterality: Right;   DILATATION & CURETTAGE/HYSTEROSCOPY WITH MYOSURE N/A 08/31/2023   Procedure: DILATATION & CURETTAGE/HYSTEROSCOPY WITH MYOSURE;  Surgeon: Clide Cliff, MD;  Location: [facility];  Service: Gynecology;  Laterality: N/A;   ROBOTIC ASSISTED BILATERAL SALPINGO OOPHERECTOMY Bilateral 08/31/2023   Procedure: XI ROBOTIC ASSISTED BILATERAL SALPINGO OOPHORECTOMY;  Surgeon: Clide Cliff, MD;  Location: [facility];  Service: Gynecology;  Laterality: Bilateral;   THROAT SURGERY     Reports polyps removed from throat, done at [facility] approx 15 years ago   TONSILLECTOMY     TUBAL LIGATION Bilateral    WISDOM TOOTH EXTRACTION      Family History  Problem Relation Age of Onset   Stomach cancer Mother    Diabetes Mother    Hypertension Mother    Dementia Mother    Diabetes Father    Hypertension Father    Leukemia Father    Diabetes Sister    Diabetes Sister  Diabetes Brother    Hypertension Brother    Colon cancer Brother 31   Diabetes Brother    Diabetes Maternal Grandmother    Hypertension Maternal Grandmother    Diabetes Daughter    Hypertension Daughter    Diabetes Son    Cancer Paternal Uncle        unknown cancer   Colon polyps Neg Hx    Esophageal cancer Neg Hx    Rectal cancer Neg Hx    Breast cancer Neg Hx    Ovarian cancer  Neg Hx    Endometrial cancer Neg Hx     Social History   Socioeconomic History   Marital status: Single    Spouse name: Not on file   Number of children: 1   Years of education: 12   Highest education level: GED or equivalent  Occupational History   Occupation: CNA    Comment: Retired  Tobacco Use   Smoking status: Every Day    Current packs/day: 0.50    Average packs/day: 0.5 packs/day for 53.0 years (26.5 ttl pk-yrs)    Types: Cigarettes    Start date: 1972    Passive exposure: Current   Smokeless tobacco: Never  Vaping Use   Vaping status: Never Used  Substance and Sexual Activity   Alcohol use: Yes    Alcohol/week: 5.0 standard drinks of alcohol    Types: 5 Cans of beer per week    Comment: 5 drinks/week    Drug use: Yes    Types: Marijuana    Comment: last smoked cig today, last marijuana 11/16/2022   Sexual activity: Not Currently  Other Topics Concern   Not on file  Social History Narrative   Patient lives alone in an independent living facility.  [place] [address].    Patient walks the parameter daily ~10 minutes.    Patient enjoys seeing her neighbors.    Patient has one son and one daughter who passed away. Her mother passed 07-09-2023   Social Drivers of Health   Financial Resource Strain: Low Risk  (05/26/2023)   Overall Financial Resource Strain (CARDIA)    Difficulty of Paying Living Expenses: Not very hard  Food Insecurity: Food Insecurity Present (07/12/2023)   Hunger Vital Sign    Worried About Running Out of Food in the Last Year: Sometimes true    Ran Out of Food in the Last Year: Sometimes true  Transportation Needs: No Transportation Needs (07/12/2023)   PRAPARE - Administrator, Civil Service (Medical): No    Lack of Transportation (Non-Medical): No  Recent Concern: Transportation Needs - Unmet Transportation Needs (06/14/2023)   PRAPARE - Transportation    Lack of Transportation (Medical): Yes    Lack of  Transportation (Non-Medical): Yes  Physical Activity: Sufficiently Active (05/26/2023)   Exercise Vital Sign    Days of Exercise per Week: 6 days    Minutes of Exercise per Session: 30 min  Stress: Stress Concern Present (05/26/2023)   [employer] of Occupational Health - Occupational Stress Questionnaire    Feeling of Stress : To some extent  Social Connections: Moderately Integrated (05/26/2023)   Social Connection and Isolation Panel [NHANES]    Frequency of Communication with Friends and Family: More than three times a week    Frequency of Social Gatherings with Friends and Family: Twice a week    Attends Religious Services: More than 4 times per year    Active Member of [employer] or Organizations:  No    Attends Club or Organization Meetings: 1 to 4 times per year    Marital Status: Never married    Current Medications:  Current Outpatient Medications:    acetaminophen (TYLENOL) 650 MG CR tablet, Take 650 mg by mouth every 8 (eight) hours as needed for pain., Disp: , Rfl:    albuterol (VENTOLIN HFA) 108 (90 Base) MCG/ACT inhaler, INHALE 2 PUFFS BY MOUTH EVERY 4 HOURS AS NEEDED FOR WHEEZING FOR SHORTNESS OF BREATH, Disp: 18 g, Rfl: 0   atorvastatin (LIPITOR) 40 MG tablet, Take 1 tablet by mouth once daily, Disp: 90 tablet, Rfl: 0   chlorthalidone (HYGROTON) 25 MG tablet, Take 1 tablet (25 mg total) by mouth daily., Disp: 90 tablet, Rfl: 3   empagliflozin (JARDIANCE) 25 MG TABS tablet, Take 1 tablet (25 mg total) by mouth daily., Disp: 90 tablet, Rfl: 3   fluticasone-salmeterol (WIXELA INHUB) 250-50 MCG/ACT AEPB, Inhale 1 puff into the lungs in the morning and at bedtime., Disp: 60 each, Rfl: 5   losartan (COZAAR) 100 MG tablet, Take 1 tablet (100 mg total) by mouth at bedtime., Disp: 90 tablet, Rfl: 3   metFORMIN (GLUCOPHAGE-XR) 500 MG 24 hr tablet, Take 2 tablets (1,000 mg total) by mouth in the morning and at bedtime., Disp: 360 tablet, Rfl: 3   nystatin cream (MYCOSTATIN), Apply 1  Application topically 2 (two) times daily., Disp: 30 g, Rfl: 0   omeprazole (PRILOSEC) 20 MG capsule, Take 20 mg by mouth daily., Disp: , Rfl:    oxyCODONE (OXY IR/ROXICODONE) 5 MG immediate release tablet, Take 1 tablet (5 mg total) by mouth every 6 (six) hours as needed for severe pain (pain score 7-10). For AFTER surgery only, do not take and drive, Disp: 10 tablet, Rfl: 0   Semaglutide, 1 MG/DOSE, 4 MG/3ML SOPN, Inject 1 mg into the skin once a week., Disp: 3 mL, Rfl: 0   senna-docusate (SENOKOT-S) 8.6-50 MG tablet, Take 2 tablets by mouth at bedtime. For AFTER surgery, do not take if having diarrhea, Disp: 30 tablet, Rfl: 0   SPIRIVA RESPIMAT 2.5 MCG/ACT AERS, INHALE 2 SPRAY(S) BY MOUTH ONCE DAILY, Disp: 4 g, Rfl: 0   aspirin EC 81 MG tablet, Take 81 mg by mouth daily. Swallow whole. (Patient not taking: Reported on 09/10/2023), Disp: , Rfl:    cetirizine (ZYRTEC) 10 MG tablet, Take 1 tablet (10 mg total) by mouth daily. (Patient not taking: Reported on 09/10/2023), Disp: 30 tablet, Rfl: 0   melatonin 3 MG TABS tablet, Take 1 tablet (3 mg total) by mouth at bedtime. (Patient not taking: Reported on 09/10/2023), Disp: 90 tablet, Rfl: 0   Multiple Vitamin (MULTIVITAMIN PO), Take 1 tablet by mouth daily. (Patient not taking: Reported on 09/10/2023), Disp: , Rfl:    nicotine (NICODERM CQ - DOSED IN MG/24 HOURS) 21 mg/24hr patch, Place 1 patch (21 mg total) onto the skin daily. (Patient not taking: Reported on 09/10/2023), Disp: 28 patch, Rfl: 0  Review of Symptoms: Complete 10-system review is positive for: Wheezing, fatigue, hot flashes, pain behind right knee  Physical Exam: BP (!) 153/86 (BP Location: Right Arm, Patient Position: Sitting) Comment: Notified RN  Pulse 86   Temp 98.3 F (36.8 C) (Oral)   Resp 20   Wt 189 lb 9.6 oz (86 kg)   SpO2 97%   BMI 37.03 kg/m  General: Alert, oriented, no acute distress. HEENT: Normocephalic, atraumatic. Neck symmetric without masses. Sclera  anicteric.  Chest: Normal work of breathing.  Abdomen: Soft, nontender.  Normoactive bowel sounds.  No masses appreciated.  Well-healing incisions with some residual glue. Extremities: Grossly normal range of motion.  Warm, well perfused.  No edema bilaterally. No calf tenderness to palpation Skin: No rashes or lesions noted.  Laboratory & Radiologic Studies: Surgical pathology (08/31/23): A. 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            No malignant transformation seen.        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            Negative for borderline features or malignancy.        Bilateral fallopian tubes.            Unremarkable fimbriated fallopian tubes.            Negative for malignancy.   B. ENDOMETRIAL CURETTAGE:       Benign inactive endometrium.       Negative for hyperplasia, atypia or malignancy.

## 2023-09-17 ENCOUNTER — Other Ambulatory Visit: Payer: Self-pay | Admitting: Student

## 2023-09-20 ENCOUNTER — Telehealth: Payer: Self-pay | Admitting: Student

## 2023-09-20 ENCOUNTER — Ambulatory Visit: Payer: Medicare HMO | Admitting: Pharmacist

## 2023-09-20 NOTE — Telephone Encounter (Signed)
 Patient calls requesting a new prescription be send to [facility] for a rollator. Patient states this was requested before but our office took to long to process it, so it has to be started all over. Please reach out to patient with any questions.

## 2023-09-21 ENCOUNTER — Encounter: Payer: Self-pay | Admitting: Psychiatry

## 2023-09-23 ENCOUNTER — Ambulatory Visit (INDEPENDENT_AMBULATORY_CARE_PROVIDER_SITE_OTHER): Payer: Medicare HMO | Admitting: Student

## 2023-09-23 ENCOUNTER — Telehealth: Payer: Self-pay

## 2023-09-23 ENCOUNTER — Encounter (HOSPITAL_COMMUNITY): Payer: Medicare HMO

## 2023-09-23 VITALS — BP 138/80 | HR 100 | Ht 60.0 in | Wt 192.4 lb

## 2023-09-23 DIAGNOSIS — M7989 Other specified soft tissue disorders: Secondary | ICD-10-CM | POA: Diagnosis not present

## 2023-09-23 DIAGNOSIS — M545 Low back pain, unspecified: Secondary | ICD-10-CM

## 2023-09-23 DIAGNOSIS — Z720 Tobacco use: Secondary | ICD-10-CM

## 2023-09-23 MED ORDER — BUPROPION HCL ER (XL) 150 MG PO TB24: 150.0000 mg | ORAL_TABLET | Freq: Every day | ORAL | Status: DC

## 2023-09-23 MED ORDER — ROLLATOR ULTRA-LIGHT MISC: 0 refills | Status: DC

## 2023-09-23 MED ORDER — BUPROPION HCL ER (XL) 150 MG PO TB24: 150.0000 mg | ORAL_TABLET | Freq: Every day | ORAL | 3 refills | Status: DC

## 2023-09-23 NOTE — Progress Notes (Signed)
    SUBJECTIVE:   CHIEF COMPLAINT / HPI:   Alexandra Campos is a 68 year-old female here for leg swelling/pain, tobacco use and need for assistive ambulatory device.  Leg swelling and pain, right-sided She has noticed swelling, pain in her posterior right leg compared to the left leg since She says it hurts when she walks No open wounds/ulcers.  No worsening shortness of breath compared to her baseline Does not wear oxygen at rest  Tobacco use Smoking more than a pack per day.  Says that her cigarette smoking has been increasing since Christmas when her mother passed away and she has had a lot more stressors. She has tried bupropion  in the past, which did provide some benefit.  She is interested in trying this again.  She said she cannot take Chantix  because it gives her nightmares  Needs rollator Currently using a cane Cannot ambulate > 300 feet without fatigue, pain, shortness of breath Has multiple chronic conditions making it difficult to ambulate without assistance (COPD and multilevel osteoarthritis) High risk for falls without assistive device  PERTINENT  PMH / PSH:  COPD Tobacco use T2DM Postmenopausal bleeding Hypertension  OBJECTIVE:   BP 138/80   Pulse 100   Ht 5' (1.524 m)   Wt 192 lb 6.4 oz (87.3 kg)   SpO2 96%   BMI 37.58 kg/m   General: NAD, chronically ill-appearing, no distress, ambulates with a cane Cardiac: RRR Neuro: A&O Respiratory: normal WOB on RA. No wheezing or crackles on auscultation, good air movement throughout but does have diminished lung sounds at the bases. Extremities: Mild swelling in the posterior popliteal fossa on the right side compared to left.  No significant erythema or tenderness to palpation in right calf/posterior thigh.  ASSESSMENT/PLAN:   Leg swelling Mostly in the popliteal fossa on the right side. Cannot rule out DVT and she is high risk given her history of tobacco use, general immobility.  Reassuringly, normal work of  breathing on room air, no tachycardia and she has SpO2 of 98%. DVT Doppler ultrasound ordered stat. Also must consider osteoarthritis as cause of increased swelling, no concern for Baker's cyst. No signs of cellulitis.  Swelling is unilateral and focal, not concerning for peripheral edema seen in CHF exacerbation. I will follow-up on ultrasound and relay results accordingly.  Tobacco use At least 1.5 pack/day in setting of COPD patient. Interested in quitting. Discussed mind-body connection and CBT versus psychotherapy. Prescribed bupropion  150 mg daily as this has worked for the patient in the past. She does not have any contraindications including history of bulimia, seizure disorder.   Order for rollator placed.  Patient will provide our nurse team with the contact information for her case manager to help get this process.  Jim Lundin, DO [facility]

## 2023-09-23 NOTE — Telephone Encounter (Signed)
 Called and informed patient of STAT ultrasound DVT.  Patient initially stated that she would not go because of impending inclement weather.  I reiterated to patient that Dr. Dameron ordered it STAT meaning ASAP because she was worried about her having a blood clot.  Patient hesitated and states that she will go.  Cena JONELLE Pesa, CMA

## 2023-09-23 NOTE — Patient Instructions (Signed)
 It was great seeing you today.  As we discussed, - We are getting an ultrasound of your lower legs to make sure you dont have a blood clot - I sent Bupropion  to your pharmacy to help with quitting smoking. Please schedule a follow up visit at the front desk to see me in 1 month for follow up. - I sent a prescription for the rollator. Please send me the number for your case worker and we will try to sort this out   If you have any questions or concerns, please feel free to call the clinic.   Have a wonderful day,  Dr. Barabara Dama [facility] [phone]

## 2023-09-23 NOTE — Telephone Encounter (Signed)
 Patient calls nurse line to provide me with number for Rock Bruns, RN case manager with [facility].   Phone number: [phone] ext: [phone]  Called and left voicemail asking that Rock return call to determine what steps are needed for patient to receive Rollator.   Chiquita JAYSON English, RN

## 2023-09-24 ENCOUNTER — Ambulatory Visit (HOSPITAL_COMMUNITY)
Admission: RE | Admit: 2023-09-24 | Discharge: 2023-09-24 | Disposition: A | Discharge status: Home / Self Care | Payer: Medicare HMO | Source: Ambulatory Visit | Attending: Family Medicine | Admitting: Family Medicine

## 2023-09-24 DIAGNOSIS — M7989 Other specified soft tissue disorders: Secondary | ICD-10-CM

## 2023-09-24 DIAGNOSIS — Z720 Tobacco use: Secondary | ICD-10-CM | POA: Insufficient documentation

## 2023-09-24 NOTE — Assessment & Plan Note (Signed)
 At least 1.5 pack/day in setting of COPD patient. Interested in quitting. Discussed mind-body connection and CBT versus psychotherapy. Prescribed bupropion  150 mg daily as this has worked for the patient in the past. She does not have any contraindications including history of bulimia, seizure disorder.

## 2023-09-24 NOTE — Assessment & Plan Note (Signed)
 Mostly in the popliteal fossa on the right side. Cannot rule out DVT and she is high risk given her history of tobacco use, general immobility.  Reassuringly, normal work of breathing on room air, no tachycardia and she has SpO2 of 98%. DVT Doppler ultrasound ordered stat. Also must consider osteoarthritis as cause of increased swelling, no concern for Baker's cyst. No signs of cellulitis.  Swelling is unilateral and focal, not concerning for peripheral edema seen in CHF exacerbation. I will follow-up on ultrasound and relay results accordingly.

## 2023-09-24 NOTE — Progress Notes (Signed)
 Lower ext venous duplex  has been completed. Refer to [facility] under chart review to view preliminary results. Called and left message to ordering provider.  09/24/2023  11:34 AM Palmer Fahrner, Gerarda Gunther

## 2023-09-27 ENCOUNTER — Other Ambulatory Visit: Payer: Self-pay | Admitting: Student

## 2023-09-27 ENCOUNTER — Telehealth: Payer: Self-pay

## 2023-09-27 NOTE — Telephone Encounter (Signed)
 Patient calls nurse line regarding PA being needed for Ozempic .   Will forward to pharmacy team for assistance.   Patient is also asking if it is safe for her to take a Mucinex cough drop with dextromethorphan.  Will forward to PCP for further advisement.   Chiquita JAYSON English, RN

## 2023-09-28 MED ORDER — ROLLATOR ULTRA-LIGHT MISC: 0 refills | Status: AC

## 2023-09-28 NOTE — Telephone Encounter (Signed)
 Received returned call from [facility] regarding rollator.   She states that new rx needs to be written to include that prior rollator dispensed in 2023 is irretrievably broken, justifying the need for new rollator.   Will forward request to PCP.   Chiquita JAYSON English, RN

## 2023-09-28 NOTE — Addendum Note (Signed)
 Addended by: Darral Dash on: 09/28/2023 12:43 PM   Modules accepted: Orders

## 2023-10-01 ENCOUNTER — Other Ambulatory Visit (HOSPITAL_COMMUNITY): Payer: Self-pay

## 2023-10-01 ENCOUNTER — Telehealth: Payer: Self-pay | Admitting: Primary Care

## 2023-10-01 ENCOUNTER — Ambulatory Visit (INDEPENDENT_AMBULATORY_CARE_PROVIDER_SITE_OTHER): Payer: Medicare HMO | Admitting: Primary Care

## 2023-10-01 ENCOUNTER — Ambulatory Visit: Payer: Medicare HMO | Admitting: Pulmonary Disease

## 2023-10-01 ENCOUNTER — Encounter: Payer: Self-pay | Admitting: Primary Care

## 2023-10-01 VITALS — BP 128/67 | HR 77 | Temp 96.8°F | Ht 60.0 in | Wt 191.0 lb

## 2023-10-01 DIAGNOSIS — J432 Centrilobular emphysema: Secondary | ICD-10-CM

## 2023-10-01 DIAGNOSIS — J449 Chronic obstructive pulmonary disease, unspecified: Secondary | ICD-10-CM | POA: Diagnosis not present

## 2023-10-01 DIAGNOSIS — F17210 Nicotine dependence, cigarettes, uncomplicated: Secondary | ICD-10-CM | POA: Diagnosis not present

## 2023-10-01 LAB — PULMONARY FUNCTION TEST
DL/VA % pred: 85 %
DL/VA: 3.68 ml/min/mmHg/L
DLCO cor % pred: 65 %
DLCO cor: 11.25 ml/min/mmHg
DLCO unc % pred: 56 %
DLCO unc: 9.82 ml/min/mmHg
FEF 25-75 Post: 0.79 L/s
FEF 25-75 Pre: 0.88 L/s
FEF2575-%Change-Post: -9 %
FEF2575-%Pred-Post: 41 %
FEF2575-%Pred-Pre: 46 %
FEV1-%Change-Post: -2 %
FEV1-%Pred-Post: 65 %
FEV1-%Pred-Pre: 66 %
FEV1-Post: 1.34 L
FEV1-Pre: 1.37 L
FEV1FVC-%Change-Post: 0 %
FEV1FVC-%Pred-Pre: 92 %
FEV6-%Change-Post: -3 %
FEV6-%Pred-Post: 71 %
FEV6-%Pred-Pre: 74 %
FEV6-Post: 1.85 L
FEV6-Pre: 1.92 L
FEV6FVC-%Pred-Post: 104 %
FEV6FVC-%Pred-Pre: 104 %
FVC-%Change-Post: -3 %
FVC-%Pred-Post: 68 %
FVC-%Pred-Pre: 71 %
FVC-Post: 1.85 L
FVC-Pre: 1.92 L
Post FEV1/FVC ratio: 72 %
Post FEV6/FVC ratio: 100 %
Pre FEV1/FVC ratio: 71 %
Pre FEV6/FVC Ratio: 100 %
RV % pred: 126 %
RV: 2.39 L
TLC % pred: 94 %
TLC: 4.21 L

## 2023-10-01 MED ORDER — BUPROPION HCL ER (SR) 150 MG PO TB12: 150.0000 mg | ORAL_TABLET | Freq: Two times a day (BID) | ORAL | 3 refills | Status: DC

## 2023-10-01 NOTE — Patient Instructions (Signed)
Full PFT performed today. °

## 2023-10-01 NOTE — Progress Notes (Signed)
Full PFT performed today. °

## 2023-10-01 NOTE — Progress Notes (Signed)
@Patient  ID: Alexandra Campos, female    DOB: 04-28-1956, 68 y.o.   MRN: [id number]  Chief Complaint  Patient presents with   Follow-up    Referring provider: Darral Dash, DO  HPI: 68 year old female, daily smoker with history of DMII, GERD, HLD and hypertension who is referred to pulmonary clinic for emphysema.   Previous [facility] pulmonary encounter:  11/10/23- Dr. Francine Graven She complains of waking up with shortness of breath, dry nose, dry mouth and cough with clear mucous production.. She has wheezing every day. She is currently using spiriva which helps somewhat. She has not been using albuterol as she ran out of refills. She has history of asthma in childhood. She has peripheral vascular disease of the left hip and is encouraged to walk daily but she has difficulty being active due to her breathing.   She is smoking 1 pack per day and has been smoking over 40 years. No history of second hand smoke in childhood. She has tried to quit in the past without success.   10/01/2023- Interim hx  Discussed the use of AI scribe software for clinical note transcription with the patient, who gave verbal consent to proceed.  History of Present Illness   The patient, a current smoker with COPD, has been experiencing variable respiratory symptoms. She reports having good and bad days with her breathing, but did not specify the frequency or triggers of these episodes. She is currently on [place], which she takes as prescribed. However, she expresses uncertainty about the effectiveness of these medications, noting that sometimes they seem to work and sometimes they don't. She does acknowledge that she notices a positive effect at night after taking her last dose of Wixela. She occasionally uses a rescue inhaler, but not often.  The patient also has a persistent cough, which she attributes to being a smoker's cough. She reports some mucus production but does not take any specific medication for this.  She has tried Mucinex and cough drops.  In addition to her respiratory symptoms, the patient has been trying to quit smoking. She has started taking bupropion once a day, although she was initially prescribed to take it twice a day. She expresses some reluctance to increase the dosage due to previous experiences with frequent urination. She has declined the use of nicotine patches.  The patient also mentions having appointments for lung cancer screening and with a vascular specialist for PVD.    No Known Allergies  Immunization History  Administered Date(s) Administered   Fluad Trivalent(High Dose 65+) 05/31/2023   Influenza Inj Mdck Quad Pf 07/07/2018   Influenza,inj,Quad PF,6+ Mos 06/14/2019, 07/03/2020, 05/26/2021, 07/17/2022   PFIZER Comirnaty(Gray Top)Covid-19 Tri-Sucrose Vaccine 02/26/2021, 07/01/2021   PFIZER(Purple Top)SARS-COV-2 Vaccination 11/23/2019, 12/14/2019, 07/08/2020   PNEUMOCOCCAL CONJUGATE-20 07/21/2022   PPD Test 05/06/2020, 05/05/2021   Pfizer(Comirnaty)Fall Seasonal Vaccine 12 years and older 07/23/2022, 05/31/2023   Pneumococcal Conjugate-13 07/03/2019   Pneumococcal Polysaccharide-23 07/08/2020   Tdap 07/03/2019    Past Medical History:  Diagnosis Date   Abnormal ankle brachial index (ABI) 01/14/2020   Abnormal facial hair 11/24/2019   Arthritis    Chronic kidney disease    Complex cyst of right ovary 04/09/2020   Likely a dermoid. Normal CA 125     COPD (chronic obstructive pulmonary disease) (HCC)    Diabetes mellitus without complication (HCC)    GERD (gastroesophageal reflux disease)    Hyperlipidemia    Hypertension    Ovarian cyst 03/09/2020  CA-125 17.3     Peripheral vascular disease (HCC)    Postmenopausal bleeding 02/07/2020   Rash 04/20/2022   Right upper quadrant pain 01/21/2022   Stress incontinence of urine 10/03/2019   Substance abuse (HCC)    ETOH, THC   Wheezing 12/13/2020   Yeast infection involving the vagina and surrounding  area 06/10/2020    Tobacco History: Social History   Tobacco Use  Smoking Status Every Day   Current packs/day: 0.50   Average packs/day: 0.5 packs/day for 53.0 years (26.5 ttl pk-yrs)   Types: Cigarettes   Start date: 1972   Passive exposure: Current  Smokeless Tobacco Never   Ready to quit: Not Answered Counseling given: Not Answered   Outpatient Medications Prior to Visit  Medication Sig Dispense Refill   acetaminophen (TYLENOL) 650 MG CR tablet Take 650 mg by mouth every 8 (eight) hours as needed for pain.     albuterol (VENTOLIN HFA) 108 (90 Base) MCG/ACT inhaler INHALE 2 PUFFS BY MOUTH EVERY 4 HOURS AS NEEDED FOR WHEEZING FOR SHORTNESS OF BREATH 18 g 0   aspirin EC 81 MG tablet Take 81 mg by mouth daily. Swallow whole.     atorvastatin (LIPITOR) 40 MG tablet Take 1 tablet by mouth once daily 90 tablet 0   buPROPion (WELLBUTRIN XL) 150 MG 24 hr tablet Take 1 tablet (150 mg total) by mouth daily.     cetirizine (ZYRTEC) 10 MG tablet Take 1 tablet (10 mg total) by mouth daily. 30 tablet 0   chlorthalidone (HYGROTON) 25 MG tablet Take 1 tablet (25 mg total) by mouth daily. 90 tablet 3   empagliflozin (JARDIANCE) 25 MG TABS tablet Take 1 tablet (25 mg total) by mouth daily. 90 tablet 3   fluticasone-salmeterol (WIXELA INHUB) 250-50 MCG/ACT AEPB Inhale 1 puff into the lungs in the morning and at bedtime. 60 each 5   losartan (COZAAR) 100 MG tablet Take 1 tablet (100 mg total) by mouth at bedtime. 90 tablet 3   melatonin 3 MG TABS tablet Take 1 tablet (3 mg total) by mouth at bedtime. 90 tablet 0   metFORMIN (GLUCOPHAGE-XR) 500 MG 24 hr tablet Take 2 tablets (1,000 mg total) by mouth in the morning and at bedtime. 360 tablet 3   Misc. Devices (ROLLATOR ULTRA-LIGHT) MISC Prior rollator dispensed in 2023 is irretrievably broken  Currently using a cane Cannot ambulate > 300 feet without fatigue, pain, shortness of breath Has multiple chronic conditions making it difficult to ambulate  without assistance (COPD and multilevel osteoarthritis) High risk for falls without assistive device 1 each 0   Multiple Vitamin (MULTIVITAMIN PO) Take 1 tablet by mouth daily.     nystatin cream (MYCOSTATIN) Apply 1 Application topically 2 (two) times daily. 30 g 0   omeprazole (PRILOSEC) 20 MG capsule Take 20 mg by mouth daily.     OZEMPIC, 1 MG/DOSE, 4 MG/3ML SOPN INJECT 1MG  INTO THE SKIN ONCE A WEEK 3 mL 0   SPIRIVA RESPIMAT 2.5 MCG/ACT AERS INHALE 2 SPRAY(S) BY MOUTH ONCE DAILY 4 g 0   nicotine (NICODERM CQ - DOSED IN MG/24 HOURS) 21 mg/24hr patch Place 1 patch (21 mg total) onto the skin daily. (Patient not taking: Reported on 08/23/2023) 28 patch 0   oxyCODONE (OXY IR/ROXICODONE) 5 MG immediate release tablet Take 1 tablet (5 mg total) by mouth every 6 (six) hours as needed for severe pain (pain score 7-10). For AFTER surgery only, do not take and drive (Patient not taking:  Reported on 10/01/2023) 10 tablet 0   senna-docusate (SENOKOT-S) 8.6-50 MG tablet Take 2 tablets by mouth at bedtime. For AFTER surgery, do not take if having diarrhea (Patient not taking: Reported on 10/01/2023) 30 tablet 0   No facility-administered medications prior to visit.   Review of Systems  Review of Systems  Constitutional: Negative.   HENT: Negative.    Respiratory:  Positive for cough and shortness of breath.   Cardiovascular: Negative.    Physical Exam  BP 128/67 (BP Location: Right Arm, Patient Position: Sitting, Cuff Size: Large)   Pulse 77   Temp (!) 96.8 F (36 C) (Temporal)   Ht 5' (1.524 m)   Wt 191 lb (86.6 kg)   SpO2 97%   BMI 37.30 kg/m  Physical Exam Constitutional:      Appearance: Normal appearance.  HENT:     Head: Normocephalic and atraumatic.  Cardiovascular:     Rate and Rhythm: Normal rate.  Pulmonary:     Effort: Pulmonary effort is normal.  Skin:    General: Skin is warm and dry.  Neurological:     General: No focal deficit present.     Mental Status: She is alert  and oriented to person, place, and time. Mental status is at baseline.  Psychiatric:        Mood and Affect: Mood normal.        Behavior: Behavior normal.        Thought Content: Thought content normal.        Judgment: Judgment normal.      Lab Results:  CBC    Component Value Date/Time   WBC 7.4 08/31/2023 1125   RBC 3.75 (L) 08/31/2023 1125   HGB 9.9 (L) 08/31/2023 1125   HGB 10.0 (L) 08/16/2023 1506   HCT 34.3 (L) 08/31/2023 1125   HCT 32.6 (L) 08/16/2023 1506   PLT 288 08/31/2023 1125   PLT 296 08/16/2023 1506   MCV 91.5 08/31/2023 1125   MCV 83 08/16/2023 1506   MCV 92 12/12/2012 0208   MCH 26.4 08/31/2023 1125   MCHC 28.9 (L) 08/31/2023 1125   RDW 16.6 (H) 08/31/2023 1125   RDW 14.7 08/16/2023 1506   RDW 15.1 (H) 12/12/2012 0208   LYMPHSABS 2.3 08/16/2023 1506   EOSABS 0.2 08/16/2023 1506   BASOSABS 0.1 08/16/2023 1506    BMET    Component Value Date/Time   NA 136 08/31/2023 1125   NA 142 08/16/2023 1506   NA 139 12/12/2012 1420   K 3.3 (L) 08/31/2023 1125   K 3.4 (L) 12/12/2012 1420   CL 105 08/31/2023 1125   CL 106 12/12/2012 1420   CO2 20 (L) 08/31/2023 1125   CO2 24 12/12/2012 1420   GLUCOSE 183 (H) 08/31/2023 1125   GLUCOSE 282 (H) 12/12/2012 1420   BUN 22 08/31/2023 1125   BUN 23 08/16/2023 1506   BUN 12 12/12/2012 1420   CREATININE 1.03 (H) 08/31/2023 1125   CREATININE 0.71 12/12/2012 1420   CALCIUM 9.0 08/31/2023 1125   CALCIUM 7.8 (L) 12/12/2012 1420   GFRNONAA >60 08/31/2023 1125   GFRNONAA >60 12/12/2012 1420   GFRAA 57 (L) 06/10/2020 0942   GFRAA >60 12/12/2012 1420    BNP    Component Value Date/Time   BNP 27.4 08/16/2023 1506    ProBNP No results found for: "PROBNP"  Imaging: VAS [place] LOWER EXTREMITY VENOUS (DVT) Result Date: 09/24/2023  Lower Venous DVT Study Patient Name:  Alexandra Campos  Date of Exam:   09/24/2023 Medical Rec #: [id number]       Accession #:    [id number] Date of Birth: December 28, 1955        Patient Gender: F  Patient Age:   6 years Exam Location:  [facility] Procedure:      VAS [place] LOWER EXTREMITY VENOUS (DVT) Referring Phys: CARINA BROWN --------------------------------------------------------------------------------  Indications: Pain behind right knee for several days. History of known PVD.  Comparison Study: No priors. Performing Technologist: Marilynne Halsted RDMS, RVT  Examination Guidelines: A complete evaluation includes B-mode imaging, spectral Doppler, color Doppler, and power Doppler as needed of all accessible portions of each vessel. Bilateral testing is considered an integral part of a complete examination. Limited examinations for reoccurring indications may be performed as noted. The reflux portion of the exam is performed with the patient in reverse Trendelenburg.  +---------+---------------+---------+-----------+----------+--------------+ RIGHT    CompressibilityPhasicitySpontaneityPropertiesThrombus Aging +---------+---------------+---------+-----------+----------+--------------+ CFV      Full           Yes      Yes                                 +---------+---------------+---------+-----------+----------+--------------+ SFJ      Full                                                        +---------+---------------+---------+-----------+----------+--------------+ FV Prox  Full                                                        +---------+---------------+---------+-----------+----------+--------------+ FV Mid   Full                                                        +---------+---------------+---------+-----------+----------+--------------+ FV DistalFull                                                        +---------+---------------+---------+-----------+----------+--------------+ PFV      Full                                                        +---------+---------------+---------+-----------+----------+--------------+ POP       Full           Yes      Yes                                 +---------+---------------+---------+-----------+----------+--------------+ PTV      Full                                                        +---------+---------------+---------+-----------+----------+--------------+  PERO     Full                                                        +---------+---------------+---------+-----------+----------+--------------+   +---------+---------------+---------+-----------+----------+--------------+ LEFT     CompressibilityPhasicitySpontaneityPropertiesThrombus Aging +---------+---------------+---------+-----------+----------+--------------+ CFV      Full           Yes      Yes                                 +---------+---------------+---------+-----------+----------+--------------+ SFJ      Full                                                        +---------+---------------+---------+-----------+----------+--------------+ FV Prox  Full                                                        +---------+---------------+---------+-----------+----------+--------------+ FV Mid   Full                                                        +---------+---------------+---------+-----------+----------+--------------+ FV DistalFull                                                        +---------+---------------+---------+-----------+----------+--------------+ PFV      Full                                                        +---------+---------------+---------+-----------+----------+--------------+ POP      Full           Yes      Yes                                 +---------+---------------+---------+-----------+----------+--------------+ PTV      Full                                                        +---------+---------------+---------+-----------+----------+--------------+ PERO     Full                                                         +---------+---------------+---------+-----------+----------+--------------+  Summary: BILATERAL: - No evidence of deep vein thrombosis seen in the lower extremities, bilaterally. -No evidence of popliteal cyst, bilaterally.   *See table(s) above for measurements and observations. Electronically signed by Lemar Livings MD on 09/24/2023 at 7:21:16 PM.    Final      Assessment & Plan:   1. Centrilobular emphysema (HCC) (Primary)  2. Chronic obstructive pulmonary disease, unspecified COPD type (HCC)  3. Cigarette smoker     Tobacco Use Disorder Current smoker, started Bupropion ER once daily for smoking cessation. Discussed increasing to Bupropion SR 150mg  twice daily. -Discontinue Bupropion ER. -Start Bupropion SR 150mg  twice daily.  COPD Moderate severity, stable over the past year. Patient reports variable symptom control with Spiriva and Wixela. No frequent use of rescue inhaler. -Continue Spiriva and Wixela as prescribed. -Monitor for signs of exacerbation such as increased mucus production, color change in mucus, increased use of rescue inhaler.  Lung Cancer Screening Due for annual screening. -Refer for lung cancer screening CT scan.  Peripheral Artery Disease Patient has upcoming appointment with vascular specialist. -Encourage smoking cessation to improve vascular health.      Glenford Bayley, NP 10/01/2023

## 2023-10-01 NOTE — Patient Instructions (Addendum)
-TOBACCO USE DISORDER: Tobacco use disorder is a condition where a person is dependent on smoking. We have decided to discontinue your current Bupropion XL and start you on Bupropion SR 150mg  twice daily to help you quit smoking. This change should help manage your symptoms better.  -COPD: COPD, or chronic obstructive pulmonary disease, is a lung condition that makes it hard to breathe. You will continue taking Spiriva and Wixela as prescribed. Please monitor for any signs of worsening symptoms, such as increased mucus production, changes in mucus color, or increased use of your rescue inhaler.  -LUNG CANCER SCREENING: Lung cancer screening is important for early detection of lung cancer. You are due for your annual screening this month, and we will refer you for a lung cancer screening CT scan.  Follow-up 3-4 months with Dr. Francine Graven    Steps to Quit Smoking Smoking tobacco is the leading cause of preventable death. It can affect almost every organ in the body. Smoking puts you and people around you at risk for many serious, long-lasting (chronic) diseases. Quitting smoking can be hard, but it is one of the best things that you can do for your health. It is never too late to quit. Do not give up if you cannot quit the first time. Some people need to try many times to quit. Do your best to stick to your quit plan, and talk with your doctor if you have any questions or concerns. How do I get ready to quit? Pick a date to quit. Set a date within the next 2 weeks to give you time to prepare. Write down the reasons why you are quitting. Keep this list in places where you will see it often. Tell your family, friends, and co-workers that you are quitting. Their support is important. Talk with your doctor about the choices that may help you quit. Find out if your health insurance will pay for these treatments. Know the people, places, things, and activities that make you want to smoke (triggers). Avoid  them. What first steps can I take to quit smoking? Throw away all cigarettes at home, at work, and in your car. Throw away the things that you use when you smoke, such as ashtrays and lighters. Clean your car. Empty the ashtray. Clean your home, including curtains and carpets. What can I do to help me quit smoking? Talk with your doctor about taking medicines and seeing a counselor. You are more likely to succeed when you do both. If you are pregnant or breastfeeding: Talk with your doctor about counseling or other ways to quit smoking. Do not take medicine to help you quit smoking unless your doctor tells you to. Quit right away Quit smoking completely, instead of slowly cutting back on how much you smoke over a period of time. Stopping smoking right away may be more successful than slowly quitting. Go to counseling. In-person is best if this is an option. You are more likely to quit if you go to counseling sessions regularly. Take medicine You may take medicines to help you quit. Some medicines need a prescription, and some you can buy over-the-counter. Some medicines may contain a drug called nicotine to replace the nicotine in cigarettes. Medicines may: Help you stop having the desire to smoke (cravings). Help to stop the problems that come when you stop smoking (withdrawal symptoms). Your doctor may ask you to use: Nicotine patches, gum, or lozenges. Nicotine inhalers or sprays. Non-nicotine medicine that you take by mouth. Find  resources Find resources and other ways to help you quit smoking and remain smoke-free after you quit. They include: Online chats with a Veterinary surgeon. Phone quitlines. Printed Materials engineer. Support groups or group counseling. Text messaging programs. Mobile phone apps. Use apps on your mobile phone or tablet that can help you stick to your quit plan. Examples of free services include Quit Guide from the CDC and smokefree.gov  What can I do to make it  easier to quit?  Talk to your family and friends. Ask them to support and encourage you. Call a phone quitline, such as 1-800-QUIT-NOW, reach out to support groups, or work with a Veterinary surgeon. Ask people who smoke to not smoke around you. Avoid places that make you want to smoke, such as: Bars. Parties. Smoke-break areas at work. Spend time with people who do not smoke. Lower the stress in your life. Stress can make you want to smoke. Try these things to lower stress: Getting regular exercise. Doing deep-breathing exercises. Doing yoga. Meditating. What benefits will I see if I quit smoking? Over time, you may have: A better sense of smell and taste. Less coughing and sore throat. A slower heart rate. Lower blood pressure. Clearer skin. Better breathing. Fewer sick days. Summary Quitting smoking can be hard, but it is one of the best things that you can do for your health. Do not give up if you cannot quit the first time. Some people need to try many times to quit. When you decide to quit smoking, make a plan to help you succeed. Quit smoking right away, not slowly over a period of time. When you start quitting, get help and support to keep you smoke-free. This information is not intended to replace advice given to you by your health care provider. Make sure you discuss any questions you have with your health care provider. Document Revised: 08/22/2021 Document Reviewed: 08/22/2021 Elsevier Patient Education  2024 [employer].

## 2023-10-06 NOTE — Addendum Note (Signed)
Addended by: Veronda Prude on: 10/06/2023 11:16 AM   Modules accepted: Orders

## 2023-10-06 NOTE — Telephone Encounter (Signed)
Called Linda back and LVM.   Unsure from prior VM if order needs to be faxed directly to [facility] or to [employer] or another medical equipment store.   Also updated order per Dr. Melissa Noon.   Veronda Prude, RN

## 2023-10-07 ENCOUNTER — Ambulatory Visit (HOSPITAL_COMMUNITY)
Admission: RE | Admit: 2023-10-07 | Discharge: 2023-10-07 | Disposition: A | Discharge status: Home / Self Care | Payer: Medicare HMO | Source: Ambulatory Visit | Attending: Vascular Surgery | Admitting: Vascular Surgery

## 2023-10-07 ENCOUNTER — Ambulatory Visit: Payer: Medicare HMO | Admitting: Physician Assistant

## 2023-10-07 VITALS — BP 152/97 | HR 83 | Temp 98.1°F | Resp 16 | Ht 60.0 in | Wt 188.0 lb

## 2023-10-07 DIAGNOSIS — I70219 Atherosclerosis of native arteries of extremities with intermittent claudication, unspecified extremity: Secondary | ICD-10-CM | POA: Insufficient documentation

## 2023-10-07 LAB — VAS US ABI WITH/WO TBI

## 2023-10-07 NOTE — Progress Notes (Signed)
Office Note     CC:  follow up Requesting Provider:  Darral Dash, DO  HPI: Alexandra Campos is a 68 y.o. (11/27/1955) female who presents for surveillance of PAD.  Surgical history significant for arteriogram in 2021 demonstrating multilevel occlusive disease of bilateral lower extremities involving both common femoral arteries as well as bilateral SFA occlusions.  She claudicates however this is tolerable.  She continues to be without rest pain or tissue loss.  She has drastically reduced her smoking from 1 pack a day down to 7 cigarettes a day.  She is trying to exercise although this is difficult during the winter.  She is on aspirin and statin daily.  Past medical history also significant for diabetes mellitus.  She follows regularly with her PCP.   Past Medical History:  Diagnosis Date   Abnormal ankle brachial index (ABI) 01/14/2020   Abnormal facial hair 11/24/2019   Arthritis    Chronic kidney disease    Complex cyst of right ovary 04/09/2020   Likely a dermoid. Normal CA 125     COPD (chronic obstructive pulmonary disease) (HCC)    Diabetes mellitus without complication (HCC)    GERD (gastroesophageal reflux disease)    Hyperlipidemia    Hypertension    Ovarian cyst 03/09/2020   CA-125 17.3     Peripheral vascular disease (HCC)    Postmenopausal bleeding 02/07/2020   Rash 04/20/2022   Right upper quadrant pain 01/21/2022   Stress incontinence of urine 10/03/2019   Substance abuse (HCC)    ETOH, THC   Wheezing 12/13/2020   Yeast infection involving the vagina and surrounding area 06/10/2020    Past Surgical History:  Procedure Laterality Date   ABDOMINAL AORTOGRAM W/LOWER EXTREMITY N/A 03/15/2020   Procedure: ABDOMINAL AORTOGRAM W/LOWER EXTREMITY;  Surgeon: Chuck Hint, MD;  Location: [facility] INVASIVE CV LAB;  Service: Cardiovascular;  Laterality: N/A;   ANKLE SURGERY Right    ORIF   CARPAL TUNNEL RELEASE Right 11/18/2022   Procedure: RIGHT CARPAL TUNNEL  RELEASE;  Surgeon: Tarry Kos, MD;  Location: [facility];  Service: Orthopedics;  Laterality: Right;   DILATATION & CURETTAGE/HYSTEROSCOPY WITH MYOSURE N/A 08/31/2023   Procedure: DILATATION & CURETTAGE/HYSTEROSCOPY WITH MYOSURE;  Surgeon: Clide Cliff, MD;  Location: [facility];  Service: Gynecology;  Laterality: N/A;   ROBOTIC ASSISTED BILATERAL SALPINGO OOPHERECTOMY Bilateral 08/31/2023   Procedure: XI ROBOTIC ASSISTED BILATERAL SALPINGO OOPHORECTOMY;  Surgeon: Clide Cliff, MD;  Location: [facility];  Service: Gynecology;  Laterality: Bilateral;   THROAT SURGERY     Reports polyps removed from throat, done at [facility] approx 15 years ago   TONSILLECTOMY     TUBAL LIGATION Bilateral    WISDOM TOOTH EXTRACTION      Social History   Socioeconomic History   Marital status: Single    Spouse name: Not on file   Number of children: 1   Years of education: 12   Highest education level: GED or equivalent  Occupational History   Occupation: CNA    Comment: Retired  Tobacco Use   Smoking status: Every Day    Current packs/day: 0.50    Average packs/day: 0.5 packs/day for 53.1 years (26.5 ttl pk-yrs)    Types: Cigarettes    Start date: 1972    Passive exposure: Current   Smokeless tobacco: Never  Vaping Use   Vaping status: Never Used  Substance and Sexual Activity   Alcohol use: Yes    Alcohol/week: 5.0 standard drinks of  alcohol    Types: 5 Cans of beer per week    Comment: 5 drinks/week    Drug use: Yes    Types: Marijuana    Comment: last smoked cig today, last marijuana 11/16/2022   Sexual activity: Not Currently  Other Topics Concern   Not on file  Social History Narrative   Patient lives alone in an independent living facility.  [place] [address].    Patient walks the parameter daily ~10 minutes.    Patient enjoys seeing her neighbors.    Patient has one son and one daughter who passed away. Her mother passed 07-09-2023   Social  Drivers of Health   Financial Resource Strain: Low Risk  (05/26/2023)   Overall Financial Resource Strain (CARDIA)    Difficulty of Paying Living Expenses: Not very hard  Food Insecurity: Food Insecurity Present (07/12/2023)   Hunger Vital Sign    Worried About Running Out of Food in the Last Year: Sometimes true    Ran Out of Food in the Last Year: Sometimes true  Transportation Needs: No Transportation Needs (07/12/2023)   PRAPARE - Administrator, Civil Service (Medical): No    Lack of Transportation (Non-Medical): No  Recent Concern: Transportation Needs - Unmet Transportation Needs (06/14/2023)   PRAPARE - Transportation    Lack of Transportation (Medical): Yes    Lack of Transportation (Non-Medical): Yes  Physical Activity: Sufficiently Active (05/26/2023)   Exercise Vital Sign    Days of Exercise per Week: 6 days    Minutes of Exercise per Session: 30 min  Stress: Stress Concern Present (05/26/2023)   [employer] of Occupational Health - Occupational Stress Questionnaire    Feeling of Stress : To some extent  Social Connections: Moderately Integrated (05/26/2023)   Social Connection and Isolation Panel [NHANES]    Frequency of Communication with Friends and Family: More than three times a week    Frequency of Social Gatherings with Friends and Family: Twice a week    Attends Religious Services: More than 4 times per year    Active Member of [employer] or Organizations: No    Attends Engineer, structural: 1 to 4 times per year    Marital Status: Never married  Intimate Partner Violence: Not At Risk (12/24/2022)   Humiliation, Afraid, Rape, and Kick questionnaire    Fear of Current or Ex-Partner: No    Emotionally Abused: No    Physically Abused: No    Sexually Abused: No    Family History  Problem Relation Age of Onset   Stomach cancer Mother    Diabetes Mother    Hypertension Mother    Dementia Mother    Diabetes Father    Hypertension Father     Leukemia Father    Diabetes Sister    Diabetes Sister    Diabetes Brother    Hypertension Brother    Colon cancer Brother 50   Diabetes Brother    Diabetes Maternal Grandmother    Hypertension Maternal Grandmother    Diabetes Daughter    Hypertension Daughter    Diabetes Son    Cancer Paternal Uncle        unknown cancer   Colon polyps Neg Hx    Esophageal cancer Neg Hx    Rectal cancer Neg Hx    Breast cancer Neg Hx    Ovarian cancer Neg Hx    Endometrial cancer Neg Hx     Current Outpatient Medications  Medication Sig Dispense Refill   acetaminophen (TYLENOL) 650 MG CR tablet Take 650 mg by mouth every 8 (eight) hours as needed for pain.     albuterol (VENTOLIN HFA) 108 (90 Base) MCG/ACT inhaler INHALE 2 PUFFS BY MOUTH EVERY 4 HOURS AS NEEDED FOR WHEEZING FOR SHORTNESS OF BREATH 18 g 0   aspirin EC 81 MG tablet Take 81 mg by mouth daily. Swallow whole.     atorvastatin (LIPITOR) 40 MG tablet Take 1 tablet by mouth once daily 90 tablet 0   buPROPion (WELLBUTRIN SR) 150 MG 12 hr tablet Take 1 tablet (150 mg total) by mouth 2 (two) times daily. 60 tablet 3   cetirizine (ZYRTEC) 10 MG tablet Take 1 tablet (10 mg total) by mouth daily. 30 tablet 0   chlorthalidone (HYGROTON) 25 MG tablet Take 1 tablet (25 mg total) by mouth daily. 90 tablet 3   empagliflozin (JARDIANCE) 25 MG TABS tablet Take 1 tablet (25 mg total) by mouth daily. 90 tablet 3   fluticasone-salmeterol (WIXELA INHUB) 250-50 MCG/ACT AEPB Inhale 1 puff into the lungs in the morning and at bedtime. 60 each 5   losartan (COZAAR) 100 MG tablet Take 1 tablet (100 mg total) by mouth at bedtime. 90 tablet 3   melatonin 3 MG TABS tablet Take 1 tablet (3 mg total) by mouth at bedtime. 90 tablet 0   metFORMIN (GLUCOPHAGE-XR) 500 MG 24 hr tablet Take 2 tablets (1,000 mg total) by mouth in the morning and at bedtime. 360 tablet 3   Misc. Devices (ROLLATOR ULTRA-LIGHT) MISC Prior rollator dispensed in 2023 is irretrievably  broken  Currently using a cane Cannot ambulate > 300 feet without fatigue, pain, shortness of breath Has multiple chronic conditions making it difficult to ambulate without assistance (COPD and multilevel osteoarthritis) High risk for falls without assistive device 1 each 0   Multiple Vitamin (MULTIVITAMIN PO) Take 1 tablet by mouth daily.     nystatin cream (MYCOSTATIN) Apply 1 Application topically 2 (two) times daily. 30 g 0   omeprazole (PRILOSEC) 20 MG capsule Take 20 mg by mouth daily.     OZEMPIC, 1 MG/DOSE, 4 MG/3ML SOPN INJECT 1MG  INTO THE SKIN ONCE A WEEK 3 mL 0   SPIRIVA RESPIMAT 2.5 MCG/ACT AERS INHALE 2 SPRAY(S) BY MOUTH ONCE DAILY 4 g 0   No current facility-administered medications for this visit.    No Known Allergies   REVIEW OF SYSTEMS:   [X]  denotes positive finding, [ ]  denotes negative finding Cardiac  Comments:  Chest pain or chest pressure:    Shortness of breath upon exertion:    Short of breath when lying flat:    Irregular heart rhythm:        Vascular    Pain in calf, thigh, or hip brought on by ambulation:    Pain in feet at night that wakes you up from your sleep:     Blood clot in your veins:    Leg swelling:         Pulmonary    Oxygen at home:    Productive cough:     Wheezing:         Neurologic    Sudden weakness in arms or legs:     Sudden numbness in arms or legs:     Sudden onset of difficulty speaking or slurred speech:    Temporary loss of vision in one eye:     Problems with dizziness:  Gastrointestinal    Blood in stool:     Vomited blood:         Genitourinary    Burning when urinating:     Blood in urine:        Psychiatric    Major depression:         Hematologic    Bleeding problems:    Problems with blood clotting too easily:        Skin    Rashes or ulcers:        Constitutional    Fever or chills:      PHYSICAL EXAMINATION:  Vitals:   10/07/23 0926  BP: (!) 152/97  Pulse: 83  Resp: 16   Temp: 98.1 F (36.7 C)  SpO2: 96%  Weight: 188 lb (85.3 kg)  Height: 5' (1.524 m)    General:  WDWN in NAD; vital signs documented above Gait: Not observed HENT: WNL, normocephalic Pulmonary: normal non-labored breathing , without Rales, rhonchi,  wheezing Cardiac: regular HR Abdomen: soft, NT, no masses Skin: without rashes Vascular Exam/Pulses: absent pedal pulses Extremities: without ischemic changes, without Gangrene , without cellulitis; without open wounds; callus L 3rd toe tip Musculoskeletal: no muscle wasting or atrophy  Neurologic: A&O X 3 Psychiatric:  The pt has Normal affect.   Non-Invasive Vascular Imaging:    ABI/TBIToday's ABIToday's TBIPrevious ABIPrevious TBI  +-------+-----------+-----------+------------+------------+  Right [place]         0.26       [place]          0.14          +-------+-----------+-----------+------------+------------+  Left  [place]         0.34       [place]          0.24          +-------+-----------+-----------+------------+------------+     ASSESSMENT/PLAN:: 68 y.o. female with PAD.  Known bilateral SFA occlusions  Alexandra Campos is a 68 year old female who returns for surveillance of PAD.  Based on angiography in 2021 by Dr. Edilia Bo, she has known multilevel occlusive disease involving both common femoral arteries as well as bilateral SFA occlusions.  She has mild claudication symptoms bilaterally however is without rest pain or tissue loss.  No indication for revascularization currently.  Patient states symptoms are tolerable.  She will continue her aspirin and statin daily.  I encouraged her to continue to walk for exercise.  I also encouraged her to continue smoking cessation efforts.  We will repeat ABI in 1 year.  She knows to return to office sooner if she develops rest pain or tissue loss.   Emilie Rutter, PA-C [facility] [phone]  Clinic MD:   Randie Heinz

## 2023-10-10 ENCOUNTER — Other Ambulatory Visit: Payer: Self-pay | Admitting: Student

## 2023-10-12 ENCOUNTER — Ambulatory Visit (INDEPENDENT_AMBULATORY_CARE_PROVIDER_SITE_OTHER): Payer: Medicare HMO | Admitting: Physician Assistant

## 2023-10-12 ENCOUNTER — Other Ambulatory Visit (INDEPENDENT_AMBULATORY_CARE_PROVIDER_SITE_OTHER): Payer: Medicare HMO

## 2023-10-12 ENCOUNTER — Encounter: Payer: Self-pay | Admitting: Physician Assistant

## 2023-10-12 DIAGNOSIS — M25571 Pain in right ankle and joints of right foot: Secondary | ICD-10-CM

## 2023-10-12 DIAGNOSIS — M25561 Pain in right knee: Secondary | ICD-10-CM | POA: Diagnosis not present

## 2023-10-12 DIAGNOSIS — G8929 Other chronic pain: Secondary | ICD-10-CM

## 2023-10-12 DIAGNOSIS — M1711 Unilateral primary osteoarthritis, right knee: Secondary | ICD-10-CM | POA: Diagnosis not present

## 2023-10-12 MED ORDER — ACETAMINOPHEN-CODEINE 300-30 MG PO TABS: 1.0000 | ORAL_TABLET | Freq: Two times a day (BID) | ORAL | 0 refills | Status: DC | PRN

## 2023-10-12 MED ORDER — METHYLPREDNISOLONE ACETATE 40 MG/ML IJ SUSP
40.0000 mg | INTRAMUSCULAR | Status: AC | PRN
  Administered 2023-10-12: 40 mg via INTRA_ARTICULAR

## 2023-10-12 MED ORDER — BUPIVACAINE HCL 0.25 % IJ SOLN
2.0000 mL | INTRAMUSCULAR | Status: AC | PRN
  Administered 2023-10-12: 2 mL via INTRA_ARTICULAR

## 2023-10-12 MED ORDER — LIDOCAINE HCL 1 % IJ SOLN
2.0000 mL | INTRAMUSCULAR | Status: AC | PRN
  Administered 2023-10-12: 2 mL

## 2023-10-12 NOTE — Progress Notes (Signed)
Office Visit Note   Patient: Alexandra Campos           Date of Birth: 06/19/1956           MRN: [id number] Visit Date: 10/12/2023              Requested by: Darral Dash, DO [address] [place],  [place] [address] PCP: Darral Dash, DO   Assessment & Plan: Visit Diagnoses:  1. Unilateral primary osteoarthritis, right knee   2. Pain in right ankle and joints of right foot     Plan: Impression is advanced right knee and right ankle arthritis.  We have discussed cortisone injections to both joints however she is a diabetic so we can only do 1 today.  She has elected to proceed with the right knee as this appears to be more symptomatic today.  She may follow-up in a few weeks to have her right ankle injected if she wishes.  Otherwise, follow-up as needed.  Follow-Up Instructions: Return if symptoms worsen or fail to improve.   Orders:  Orders Placed This Encounter  Procedures   Large Joint Inj: R knee   XR KNEE 3 VIEW RIGHT   XR Ankle Complete Right   Meds ordered this encounter  Medications   acetaminophen-codeine (TYLENOL #3) 300-30 MG tablet    Sig: Take 1 tablet by mouth 2 (two) times daily as needed for moderate pain (pain score 4-6).    Dispense:  30 tablet    Refill:  0      Procedures: Large Joint Inj: R knee on 10/12/2023 9:22 AM Indications: pain Details: 22 G needle, anterolateral approach Medications: 2 mL lidocaine 1 %; 2 mL bupivacaine 0.25 %; 40 mg methylPREDNISolone acetate 40 MG/ML      Clinical Data: No additional findings.   Subjective: Chief Complaint  Patient presents with   Right Knee - Pain    HPI patient is a very pleasant 68 year old female who comes in today with right knee and right ankle pain.  In regards to the right knee, she has had pain for the past month.  Symptoms began after undergoing GYN surgery back in December.  The pain she has is to the posterior knee but is worse with activity.  She has been taking Tylenol without  significant relief.  She does tell me she has a "blockage "in her hip.  She sees vascular surgery for this.  She denies any previous cortisone injection to the right knee.  Of note, she is diabetic.  In regards to the right ankle, she has had pain for over a month.  She does tell me she underwent right ankle surgery decades ago.  The pain she is having is to the lateral ankle and is worse with activity.  She has associated swelling.  She has been taking Tylenol without significant relief.  Review of Systems as detailed in HPI.  All others reviewed and are negative.   Objective: Vital Signs: There were no vitals taken for this visit.  Physical Exam well-developed well-nourished female no acute distress.  Alert and oriented x 3.  Ortho Exam right knee exam: Range of motion 0 to 130 degrees.  Medial joint line tenderness.  No tenderness in the popliteal fossa or calf.  Ligaments are stable.  She is neurovascularly intact distally.  Right ankle exam: Mild swelling.  She has tenderness over the lateral malleolus.  No pain to the medial ankle or over the peroneal or posterior tibial tendons.  She has slight discomfort with inversion otherwise no pain with range of motion.  She is neurovascularly intact distally.  Specialty Comments:  No specialty comments available.  Imaging: XR Ankle Complete Right Result Date: 10/12/2023 Advanced arthritic change throughout the tibiotalar joint in addition to the majority of the mid and hindfoot.  XR KNEE 3 VIEW RIGHT Result Date: 10/12/2023 X-rays demonstrate moderate tricompartmental degenerative changes    PMFS History: Patient Active Problem List   Diagnosis Date Noted   Leg swelling 09/24/2023   Tobacco use 09/24/2023   Adnexal mass 08/31/2023   Pre-op evaluation 08/16/2023   COPD with acute exacerbation (HCC) 08/03/2023   Obstructive lung disease (HCC) 07/09/2022   Low back pain 09/22/2020   Atherosclerosis of artery of extremity with rest pain  (HCC) 03/15/2020   Ovarian cyst 03/09/2020   Postmenopausal bleeding 02/07/2020   Type 2 DM with CKD stage 3 and hypertension (HCC) 07/03/2019   Class 3 severe obesity with body mass index (BMI) of 40.0 to 44.9 in adult (HCC) 06/16/2019   CKD (chronic kidney disease) stage 3, GFR 30-59 ml/min (HCC) 06/16/2019   Healthcare maintenance 06/16/2019   Carpal tunnel syndrome on right 06/16/2019   Hypertension 08/04/2018   Tobacco dependence 08/04/2018   Past Medical History:  Diagnosis Date   Abnormal ankle brachial index (ABI) 01/14/2020   Abnormal facial hair 11/24/2019   Arthritis    Chronic kidney disease    Complex cyst of right ovary 04/09/2020   Likely a dermoid. Normal CA 125     COPD (chronic obstructive pulmonary disease) (HCC)    Diabetes mellitus without complication (HCC)    GERD (gastroesophageal reflux disease)    Hyperlipidemia    Hypertension    Ovarian cyst 03/09/2020   CA-125 17.3     Peripheral vascular disease (HCC)    Postmenopausal bleeding 02/07/2020   Rash 04/20/2022   Right upper quadrant pain 01/21/2022   Stress incontinence of urine 10/03/2019   Substance abuse (HCC)    ETOH, THC   Wheezing 12/13/2020   Yeast infection involving the vagina and surrounding area 06/10/2020    Family History  Problem Relation Age of Onset   Stomach cancer Mother    Diabetes Mother    Hypertension Mother    Dementia Mother    Diabetes Father    Hypertension Father    Leukemia Father    Diabetes Sister    Diabetes Sister    Diabetes Brother    Hypertension Brother    Colon cancer Brother 50   Diabetes Brother    Diabetes Maternal Grandmother    Hypertension Maternal Grandmother    Diabetes Daughter    Hypertension Daughter    Diabetes Son    Cancer Paternal Uncle        unknown cancer   Colon polyps Neg Hx    Esophageal cancer Neg Hx    Rectal cancer Neg Hx    Breast cancer Neg Hx    Ovarian cancer Neg Hx    Endometrial cancer Neg Hx     Past Surgical  History:  Procedure Laterality Date   ABDOMINAL AORTOGRAM W/LOWER EXTREMITY N/A 03/15/2020   Procedure: ABDOMINAL AORTOGRAM W/LOWER EXTREMITY;  Surgeon: Chuck Hint, MD;  Location: [facility] INVASIVE CV LAB;  Service: Cardiovascular;  Laterality: N/A;   ANKLE SURGERY Right    ORIF   CARPAL TUNNEL RELEASE Right 11/18/2022   Procedure: RIGHT CARPAL TUNNEL RELEASE;  Surgeon: Tarry Kos, MD;  Location: [facility];  Service: Orthopedics;  Laterality: Right;   DILATATION & CURETTAGE/HYSTEROSCOPY WITH MYOSURE N/A 08/31/2023   Procedure: DILATATION & CURETTAGE/HYSTEROSCOPY WITH MYOSURE;  Surgeon: Clide Cliff, MD;  Location: [facility];  Service: Gynecology;  Laterality: N/A;   ROBOTIC ASSISTED BILATERAL SALPINGO OOPHERECTOMY Bilateral 08/31/2023   Procedure: XI ROBOTIC ASSISTED BILATERAL SALPINGO OOPHORECTOMY;  Surgeon: Clide Cliff, MD;  Location: [facility];  Service: Gynecology;  Laterality: Bilateral;   THROAT SURGERY     Reports polyps removed from throat, done at [facility] approx 15 years ago   TONSILLECTOMY     TUBAL LIGATION Bilateral    WISDOM TOOTH EXTRACTION     Social History   Occupational History   Occupation: CNA    Comment: Retired  Tobacco Use   Smoking status: Every Day    Current packs/day: 0.50    Average packs/day: 0.5 packs/day for 53.1 years (26.5 ttl pk-yrs)    Types: Cigarettes    Start date: 1972    Passive exposure: Current   Smokeless tobacco: Never  Vaping Use   Vaping status: Never Used  Substance and Sexual Activity   Alcohol use: Yes    Alcohol/week: 5.0 standard drinks of alcohol    Types: 5 Cans of beer per week    Comment: 5 drinks/week    Drug use: Yes    Types: Marijuana    Comment: last smoked cig today, last marijuana 11/16/2022   Sexual activity: Not Currently

## 2023-10-19 ENCOUNTER — Encounter: Payer: Self-pay | Admitting: Student

## 2023-10-23 ENCOUNTER — Other Ambulatory Visit: Payer: Self-pay | Admitting: Student

## 2023-10-25 ENCOUNTER — Encounter: Payer: Self-pay | Admitting: Pharmacist

## 2023-10-25 NOTE — Progress Notes (Signed)
 Patient contacted office concerned about potential weather issues tomorrow.   She was willing to come in for sample sensors to cover gap until her scheduled LIberate CGM - 3 month visit in early March.   She arrived to the office, was brought to a room and she shared that she had reduced his smoking to only 10 cigarettes in the last 2 weeks.  She states she is committed to quitting completely.   She was concerned with both pulmonary and vascular physician communications about her health.   She shared her data from her CGM and is doing VERY well on current regimen.   TIR ~ 95% over the last month.  No lows.   I asked her to schedule visit for 3 month Liberate study visit in second week of March  She agreed to call in the next few days to make appointment.   2 additional sample sensors for study provided.  Her device was connected to the [facility] site

## 2023-10-26 ENCOUNTER — Ambulatory Visit: Payer: Medicare HMO | Admitting: Pharmacist

## 2023-10-26 ENCOUNTER — Other Ambulatory Visit: Payer: Self-pay

## 2023-10-26 DIAGNOSIS — I70219 Atherosclerosis of native arteries of extremities with intermittent claudication, unspecified extremity: Secondary | ICD-10-CM

## 2023-10-28 ENCOUNTER — Telehealth: Payer: Self-pay | Admitting: Pharmacist

## 2023-10-28 NOTE — Telephone Encounter (Signed)
Patient calls office and reports that she has had problems with both the sensor placed on Monday AND then the new sensor she has placed.   She is unsure when she can arrive to a visit due to lack of transportation without assistance.  I asked her to determine availability for transportation and then we can schedule for sensor support.

## 2023-11-01 ENCOUNTER — Telehealth: Payer: Self-pay

## 2023-11-01 ENCOUNTER — Other Ambulatory Visit: Payer: Self-pay | Admitting: Student

## 2023-11-01 NOTE — Telephone Encounter (Signed)
 VOB submitted for Monovisc, left knee

## 2023-11-02 ENCOUNTER — Other Ambulatory Visit: Payer: Self-pay | Admitting: Student

## 2023-11-03 ENCOUNTER — Telehealth: Payer: Self-pay | Admitting: Pharmacist

## 2023-11-03 NOTE — Telephone Encounter (Signed)
Patient calls office and communicated she had a ride to come to [facility] at ~ 2:30 PM - she left message requesting call back.   Returned call and asked patient to come to office at ~ 2:30 for CGM restart/reapplication.   Patient arrived and we attempted restart of CGM  Unfortunately, patient was unable to recall password to reset account.  She plans to start CGM application at home.   2 sensors provided.   Doing well overall with diabetes while using CGM.    Total time with patient call and documentation of interaction: 32 minutes.  None

## 2023-11-04 NOTE — Telephone Encounter (Signed)
 Reviewed and agree with Dr Macky Lower plan.

## 2023-11-12 NOTE — Telephone Encounter (Signed)
Error

## 2023-11-16 ENCOUNTER — Other Ambulatory Visit: Payer: Self-pay | Admitting: Student

## 2023-11-22 ENCOUNTER — Ambulatory Visit (INDEPENDENT_AMBULATORY_CARE_PROVIDER_SITE_OTHER): Payer: Medicare HMO | Admitting: Pharmacist

## 2023-11-22 ENCOUNTER — Encounter: Payer: Self-pay | Admitting: Pharmacist

## 2023-11-22 VITALS — BP 135/82 | HR 82 | Wt 188.4 lb

## 2023-11-22 DIAGNOSIS — Z720 Tobacco use: Secondary | ICD-10-CM

## 2023-11-22 DIAGNOSIS — I129 Hypertensive chronic kidney disease with stage 1 through stage 4 chronic kidney disease, or unspecified chronic kidney disease: Secondary | ICD-10-CM

## 2023-11-22 DIAGNOSIS — N183 Chronic kidney disease, stage 3 unspecified: Secondary | ICD-10-CM

## 2023-11-22 DIAGNOSIS — E1122 Type 2 diabetes mellitus with diabetic chronic kidney disease: Secondary | ICD-10-CM

## 2023-11-22 LAB — POCT GLYCOSYLATED HEMOGLOBIN (HGB A1C): HbA1c, POC (controlled diabetic range): 6.9 % (ref 0.0–7.0)

## 2023-11-22 NOTE — Progress Notes (Signed)
 S:     Chief Complaint  Patient presents with   Medication Management    Diabetes   68 y.o. female who presents for diabetes evaluation, education, and management. Patient arrives in good spirits and presents without any assistance.   Patient was referred and last seen by Primary Care Provider, Dr. Melissa Noon, on 09/23/23.   PMH is significant for history of tobacco use.  Three months ago when patient was enrolled into Liberate Study (poorly controlled diabetes with A1C 8.6).   Current diabetes medications include: metformin, empagliflozin and semaglutide Current hypertension medications include: losaratan and chlorthalidone Current hyperlipidemia medications include: atorvastatin  Patient reports adherence to taking all medications as prescribed.   Do you feel that your medications are working for you? yes Have you been experiencing any side effects to the medications prescribed? no Do you have any problems obtaining medications due to transportation or finances? no Insurance coverage: [employer]  O:   Review of Systems  All other systems reviewed and are negative.   Physical Exam Vitals reviewed.  Constitutional:      Appearance: Normal appearance.  Pulmonary:     Effort: Pulmonary effort is normal.  Neurological:     Mental Status: She is alert.  Psychiatric:        Mood and Affect: Mood normal.        Behavior: Behavior normal.        Thought Content: Thought content normal.        Judgment: Judgment normal.    Unable to obtain Libre3 CGM Download. Patient reports problems with connecting to app, provided assistance in logging back in. Significant portion of the visit was spent with CGM APP and sensor - reconnection.    Lab Results  Component Value Date   HGBA1C 6.9 11/22/2023   Vitals:   11/22/23 1024  BP: 135/82  Pulse: 82  SpO2: 100%    Lipid Panel     Component Value Date/Time   CHOL 112 11/26/2021 1054   TRIG 98 11/26/2021 1054   HDL 54  11/26/2021 1054   CHOLHDL 2.1 11/26/2021 1054   LDLCALC 40 11/26/2021 1054   Patient is participating in a Managed Medicaid Plan:  Yes   A/P: LIBERATE Study:  - 8 sensors provided for a 3 month supply. Educated to contact the office if the sensor falls off early and replacements are needed before their next [employer].   Diabetes longstanding currently well-controlled, reports A1C 6.9. Patient is able to verbalize appropriate hypoglycemia management plan. Medication adherence appears good. Patient has been unable to connect to sensor through app. Provided assistance in logging back into account and connecting to provider to check glucose levels. -Continued Ozempic (semaglutide). -Continued Jardiance (empagliflozin). -Continued metformin.  -Patient educated on purpose, proper use, and potential adverse effects.  -Extensively discussed pathophysiology of diabetes, recommended lifestyle interventions, dietary effects on blood sugar control.  -Counseled on s/sx of and management of hypoglycemia.  -Next A1c anticipated June 2025.   -Continued atorvastatin 40 mg.   Hypertension longstanding currently slightly elevated. Blood pressure goal of <130/80 mmHg. Medication adherence good. Blood pressure control is suboptimal due to stressful call prior to appointment. -Continued losartan and chlorthalidone.  History of Tobacco Use Disorder - reports complete abstinence for 6 days following a single cigarette intake last Tuesday.  She reports that she is 9/10 confident she is quit - long-term for her breathing.  - Continue to support abstinence.   Written patient instructions provided. Patient verbalized understanding  of treatment plan.  Total time in face to face counseling 26 minutes.    Follow-up:  Pharmacist Dr. Raymondo Band PCP clinic visit in 12/29/23 Patient seen with Threasa Heads, PharmD Candidate.

## 2023-11-22 NOTE — Assessment & Plan Note (Signed)
 History of Tobacco Use Disorder - reports complete abstinence for 6 days following a single cigarette intake last Tuesday.  She reports that she is 9/10 confident she is quit - long-term for her breathing.  - Continue to support abstinence.

## 2023-11-22 NOTE — Assessment & Plan Note (Signed)
 Diabetes longstanding currently well-controlled, reports A1C 6.9. Patient is able to verbalize appropriate hypoglycemia management plan. Medication adherence appears good. Patient has been unable to connect to sensor through app. Provided assistance in logging back into account and connecting to provider to check glucose levels. -Continued Ozempic (semaglutide). -Continued Jardiance (empagliflozin). -Continued metformin.  -Patient educated on purpose, proper use, and potential adverse effects.  -Extensively discussed pathophysiology of diabetes, recommended lifestyle interventions, dietary effects on blood sugar control.  -Counseled on s/sx of and management of hypoglycemia.  -Next A1c anticipated June 2025 (END of Liberate study)

## 2023-11-22 NOTE — Patient Instructions (Signed)
 It was nice to see you today!  Your goal blood sugar is 80-130 before eating and less than 180 after eating.  Medication Changes: Continue all other medication the same.   Keep up the good work with diet and exercise. Aim for a diet full of vegetables, fruit and lean meats (chicken, [place], fish). Try to limit salt intake by eating fresh or frozen vegetables (instead of canned), rinse canned vegetables prior to cooking and do not add any additional salt to meals.

## 2023-11-22 NOTE — Progress Notes (Signed)
 Reviewed and agree with Dr Macky Lower plan.

## 2023-11-25 ENCOUNTER — Ambulatory Visit: Payer: Medicare HMO

## 2023-11-25 VITALS — BP 135/82 | HR 82 | Ht 60.0 in | Wt 188.0 lb

## 2023-11-25 DIAGNOSIS — Z0000 Encounter for general adult medical examination without abnormal findings: Secondary | ICD-10-CM | POA: Diagnosis not present

## 2023-11-25 NOTE — Patient Instructions (Addendum)
 Alexandra Campos , Thank you for taking time to come for your Medicare Wellness Visit. I appreciate your ongoing commitment to your health goals. Please review the following plan we discussed and let me know if I can assist you in the future.   Referrals/Orders/Follow-Ups/Clinician Recommendations: Yes; Keep maintaining your health by keeping your appointments with Dr. Melissa Noon and any specialists that you may see.  Call us if you need anything.  Have a great year!!!!  This is a list of the screening recommended for you and due dates:  Health Maintenance  Topic Date Due   Zoster (Shingles) Vaccine (1 of 2) Never done   Complete foot exam   05/20/2022   DEXA scan (bone density measurement)  Never done   Screening for Lung Cancer  09/25/2023   COVID-19 Vaccine (8 - 2024-25 season) 11/28/2023   Eye exam for diabetics  01/21/2024   Hemoglobin A1C  05/24/2024   Yearly kidney health urinalysis for diabetes  08/15/2024   Yearly kidney function blood test for diabetes  08/30/2024   Medicare Annual Wellness Visit  11/24/2024   Mammogram  03/16/2025   Colon Cancer Screening  12/31/2025   DTaP/Tdap/Td vaccine (2 - Td or Tdap) 07/02/2029   Pneumonia Vaccine  Completed   Flu Shot  Completed   Hepatitis C Screening  Completed   HPV Vaccine  Aged Out    Advanced directives: (Declined) Advance directive discussed with you today. Even though you declined this today, please call our office should you change your mind, and we can give you the proper paperwork for you to fill out.  Next Medicare Annual Wellness Visit scheduled for next year: Yes

## 2023-11-25 NOTE — Progress Notes (Signed)
 Subjective:   Alexandra Campos is a 68 y.o. who presents for a Medicare Wellness preventive visit.  Visit Complete: Virtual I connected with  Alexandra Campos on 11/25/23 by a audio enabled telemedicine application and verified that I am speaking with the correct person using two identifiers.  Patient Location: Home  Provider Location: Office/Clinic  I discussed the limitations of evaluation and management by telemedicine. The patient expressed understanding and agreed to proceed.  Vital Signs: Because this visit was a virtual/telehealth visit, some criteria may be missing or patient reported. Any vitals not documented were not able to be obtained and vitals that have been documented are patient reported.  VideoDeclined- This patient declined Librarian, academic. Therefore the visit was completed with audio only.  Persons Participating in Visit: Patient.  AWV Questionnaire: No: Patient Medicare AWV questionnaire was not completed prior to this visit.  Cardiac Risk Factors include: advanced age (>31men, >110 women);diabetes mellitus;dyslipidemia;hypertension;family history of premature cardiovascular disease;obesity (BMI >30kg/m2);smoking/ tobacco exposure     Objective:    Today's Vitals   11/25/23 1544  BP: 135/82  Pulse: 82  Weight: 188 lb (85.3 kg)  Height: 5' (1.524 m)  PainSc: 8   PainLoc: Leg   Body mass index is 36.72 kg/m.     11/25/2023    3:49 PM 08/18/2023   10:12 AM 05/31/2023   10:46 AM 04/28/2023    9:36 AM 12/24/2022   10:22 AM 11/11/2022    8:58 AM 07/23/2022    9:48 AM  Advanced Directives  Does Patient Have a Medical Advance Directive? No No No No No No No  Would patient like information on creating a medical advance directive? No - Patient declined No - Patient declined   No - Patient declined No - Patient declined No - Patient declined    Current Medications (verified) Outpatient Encounter Medications as of 11/25/2023   Medication Sig   acetaminophen (TYLENOL) 650 MG CR tablet Take 650 mg by mouth every 8 (eight) hours as needed for pain.   acetaminophen-codeine (TYLENOL #3) 300-30 MG tablet Take 1 tablet by mouth 2 (two) times daily as needed for moderate pain (pain score 4-6). (Patient not taking: Reported on 11/22/2023)   albuterol (VENTOLIN HFA) 108 (90 Base) MCG/ACT inhaler INHALE 2 PUFFS BY MOUTH EVERY 4 HOURS AS NEEDED FOR WHEEZING FOR SHORTNESS OF BREATH   aspirin EC 81 MG tablet Take 81 mg by mouth daily. Swallow whole.   atorvastatin (LIPITOR) 40 MG tablet Take 1 tablet by mouth once daily   buPROPion (WELLBUTRIN SR) 150 MG 12 hr tablet Take 1 tablet (150 mg total) by mouth 2 (two) times daily.   cetirizine (ZYRTEC) 10 MG tablet Take 1 tablet (10 mg total) by mouth daily.   chlorthalidone (HYGROTON) 25 MG tablet Take 1 tablet (25 mg total) by mouth daily.   empagliflozin (JARDIANCE) 25 MG TABS tablet Take 1 tablet (25 mg total) by mouth daily.   fluticasone-salmeterol (WIXELA INHUB) 250-50 MCG/ACT AEPB Inhale 1 puff into the lungs in the morning and at bedtime.   losartan (COZAAR) 100 MG tablet Take 1 tablet (100 mg total) by mouth at bedtime.   melatonin 3 MG TABS tablet Take 1 tablet (3 mg total) by mouth at bedtime.   metFORMIN (GLUCOPHAGE-XR) 500 MG 24 hr tablet Take 2 tablets (1,000 mg total) by mouth in the morning and at bedtime.   Misc. Devices (ROLLATOR ULTRA-LIGHT) MISC Prior rollator dispensed in 2023 is irretrievably broken  Currently using a cane Cannot ambulate > 300 feet without fatigue, pain, shortness of breath Has multiple chronic conditions making it difficult to ambulate without assistance (COPD and multilevel osteoarthritis) High risk for falls without assistive device   Multiple Vitamin (MULTIVITAMIN PO) Take 1 tablet by mouth daily.   nystatin cream (MYCOSTATIN) Apply 1 Application topically 2 (two) times daily.   omeprazole (PRILOSEC) 20 MG capsule Take 20 mg by mouth daily.    OZEMPIC, 1 MG/DOSE, 4 MG/3ML SOPN INJECT 1 MG INTO THE SKIN ONCE A WEEK   SPIRIVA RESPIMAT 2.5 MCG/ACT AERS INHALE 2 SPRAY(S) BY MOUTH ONCE DAILY   No facility-administered encounter medications on file as of 11/25/2023.    Allergies (verified) Patient has no known allergies.   History: Past Medical History:  Diagnosis Date   Abnormal ankle brachial index (ABI) 01/14/2020   Abnormal facial hair 11/24/2019   Arthritis    Chronic kidney disease    Complex cyst of right ovary 04/09/2020   Likely a dermoid. Normal CA 125     COPD (chronic obstructive pulmonary disease) (HCC)    Diabetes mellitus without complication (HCC)    GERD (gastroesophageal reflux disease)    Hyperlipidemia    Hypertension    Ovarian cyst 03/09/2020   CA-125 17.3     Peripheral vascular disease (HCC)    Postmenopausal bleeding 02/07/2020   Rash 04/20/2022   Right upper quadrant pain 01/21/2022   Stress incontinence of urine 10/03/2019   Substance abuse (HCC)    ETOH, THC   Wheezing 12/13/2020   Yeast infection involving the vagina and surrounding area 06/10/2020   Past Surgical History:  Procedure Laterality Date   ABDOMINAL AORTOGRAM W/LOWER EXTREMITY N/A 03/15/2020   Procedure: ABDOMINAL AORTOGRAM W/LOWER EXTREMITY;  Surgeon: Chuck Hint, MD;  Location: [facility] INVASIVE CV LAB;  Service: Cardiovascular;  Laterality: N/A;   ANKLE SURGERY Right    ORIF   CARPAL TUNNEL RELEASE Right 11/18/2022   Procedure: RIGHT CARPAL TUNNEL RELEASE;  Surgeon: Tarry Kos, MD;  Location:  SURGERY CENTER;  Service: Orthopedics;  Laterality: Right;   DILATATION & CURETTAGE/HYSTEROSCOPY WITH MYOSURE N/A 08/31/2023   Procedure: DILATATION & CURETTAGE/HYSTEROSCOPY WITH MYOSURE;  Surgeon: Clide Cliff, MD;  Location: [facility] ORS;  Service: Gynecology;  Laterality: N/A;   ROBOTIC ASSISTED BILATERAL SALPINGO OOPHERECTOMY Bilateral 08/31/2023   Procedure: XI ROBOTIC ASSISTED BILATERAL SALPINGO OOPHORECTOMY;   Surgeon: Clide Cliff, MD;  Location: [facility] OR[facility];  Service: Gynecology;  Laterality: Bilateral;   THROAT SURGERY     Reports polyps removed from throat, done at [facility] approx 15 years ago   TONSILLECTOMY     TUBAL LIGATION Bilateral    WISDOM TOOTH EXTRACTION     Family History  Problem Relation Age of Onset   Stomach cancer Mother    Diabetes Mother    Hypertension Mother    Dementia Mother    Diabetes Father    Hypertension Father    Leukemia Father    Diabetes Sister    Diabetes Sister    Diabetes Brother    Hypertension Brother    Colon cancer Brother 50   Diabetes Brother    Diabetes Maternal Grandmother    Hypertension Maternal Grandmother    Diabetes Daughter    Hypertension Daughter    Diabetes Son    Cancer Paternal Uncle        unknown cancer   Colon polyps Neg Hx    Esophageal cancer Neg Hx    Rectal cancer  Neg Hx    Breast cancer Neg Hx    Ovarian cancer Neg Hx    Endometrial cancer Neg Hx    Social History   Socioeconomic History   Marital status: Single    Spouse name: Not on file   Number of children: 1   Years of education: 12   Highest education level: GED or equivalent  Occupational History   Occupation: CNA    Comment: Retired  Tobacco Use   Smoking status: Every Day    Current packs/day: 0.50    Average packs/day: 0.5 packs/day for 53.2 years (26.6 ttl pk-yrs)    Types: Cigarettes    Start date: 1972    Passive exposure: Current   Smokeless tobacco: Never   Tobacco comments:    Patient stated that she quit smoking 10/16/2023.  Vaping Use   Vaping status: Never Used  Substance and Sexual Activity   Alcohol use: Yes    Alcohol/week: 5.0 standard drinks of alcohol    Types: 5 Cans of beer per week    Comment: 5 drinks/week    Drug use: Yes    Types: Marijuana    Comment: last smoked cig today, last marijuana 11/16/2022   Sexual activity: Not Currently  Other Topics Concern   Not on file  Social History Narrative   Patient lives alone  in an independent living facility.  [place] [address].    Patient walks the parameter daily ~10 minutes.    Patient enjoys seeing her neighbors.    Patient has one son and one daughter who passed away. Her mother passed 07-09-2023   Social Drivers of Health   Financial Resource Strain: Low Risk  (11/25/2023)   Overall Financial Resource Strain (CARDIA)    Difficulty of Paying Living Expenses: Not very hard  Food Insecurity: Food Insecurity Present (11/25/2023)   Hunger Vital Sign    Worried About Running Out of Food in the Last Year: Sometimes true    Ran Out of Food in the Last Year: Sometimes true  Transportation Needs: No Transportation Needs (11/25/2023)   PRAPARE - Administrator, Civil Service (Medical): No    Lack of Transportation (Non-Medical): No  Physical Activity: Sufficiently Active (11/25/2023)   Exercise Vital Sign    Days of Exercise per Week: 6 days    Minutes of Exercise per Session: 30 min  Stress: No Stress Concern Present (11/25/2023)   [employer] of Occupational Health - Occupational Stress Questionnaire    Feeling of Stress : Only a little  Social Connections: Moderately Integrated (11/25/2023)   Social Connection and Isolation Panel [NHANES]    Frequency of Communication with Friends and Family: More than three times a week    Frequency of Social Gatherings with Friends and Family: Twice a week    Attends Religious Services: More than 4 times per year    Active Member of [employer] or Organizations: No    Attends Banker Meetings: 1 to 4 times per year    Marital Status: Never married    Tobacco Counseling Ready to quit: Yes Counseling given: Not Answered Tobacco comments: Patient stated that she quit smoking 10/16/2023.    Clinical Intake:  Pre-visit preparation completed: Yes  Pain : 0-10 Pain Score: 8  (TYLENOL) Pain Type: Neuropathic pain, Chronic pain Pain Location: Leg Pain Orientation: Right,  Left Pain Relieving Factors: TYLENOL  Pain Relieving Factors: TYLENOL  BMI - recorded: 36.79 Nutritional Status: BMI > 30  Obese Nutritional Risks: None Diabetes: Yes CBG done?: No Did pt. bring in CBG monitor from home?: No  How often do you need to have someone help you when you read instructions, pamphlets, or other written materials from your doctor or pharmacy?: 1 - Never What is the last grade level you completed in school?: HSG  Interpreter Needed?: No  Information entered by :: Susie Cassette, LPN.   Activities of Daily Living     11/25/2023    3:53 PM 08/18/2023   10:15 AM  In your present state of health, do you have any difficulty performing the following activities:  Hearing? 1   Comment right ear   Vision? 0   Difficulty concentrating or making decisions? 1   Comment STM issues; mother had dementia; BSE: reading, puzzles, card games, word search   Walking or climbing stairs? 1   Comment cane, walker   Dressing or bathing? 0   Doing errands, shopping? 0 0  Preparing Food and eating ? N   Using the Toilet? N   In the past six months, have you accidently leaked urine? Y   Comment urinary incontience for 15 years   Do you have problems with loss of bowel control? N   Managing your Medications? N   Managing your Finances? N   Housekeeping or managing your Housekeeping? N     Patient Care Team: Darral Dash, DO as PCP - General (Family Medicine)  Indicate any recent Medical Services you may have received from other than [facility] providers in the past year (date may be approximate).     Assessment:   This is a routine wellness examination for Alexandra Campos.  Hearing/Vision screen Hearing Screening - Comments:: Some hearing difficulties in the right ear. No hearing aids. Vision Screening - Comments:: Wears rx glasses - up to date with routine eye exams with Dr. Nile Riggs; went to [facility] to get eyeglasses.    Goals Addressed               This  Visit's Progress     Patient Stated (pt-stated)        My goal for 2025 is to lose weight, watch my HgA1C and stop smoking.       Depression Screen     11/25/2023    3:57 PM 09/23/2023   11:03 AM 08/16/2023    2:28 PM 08/03/2023    2:05 PM 07/12/2023   10:58 AM 06/14/2023   12:25 PM 06/03/2023   10:21 AM  PHQ 2/9 Scores  PHQ - 2 Score 0 0 0 0 2 0 1  PHQ- 9 Score 0 5 6 5 4 4 6     Fall Risk     11/25/2023    3:51 PM 08/16/2023    2:23 PM 05/31/2023   10:46 AM 02/22/2023   10:33 AM 12/24/2022   10:10 AM  Fall Risk   Falls in the past year? 0 0 0 0 0  Number falls in past yr: 0 0 0 0 0  Injury with Fall? 0  0 0 0  Risk for fall due to : No Fall Risks   No Fall Risks No Fall Risks  Follow up Falls prevention discussed;Falls evaluation completed   Falls evaluation completed Falls evaluation completed    MEDICARE RISK AT HOME:  Medicare Risk at Home Any stairs in or around the home?: Yes (stays in apt complex with elevator) If so, are there any without handrails?: No Home free of loose throw  rugs in walkways, pet beds, electrical cords, etc?: Yes Adequate lighting in your home to reduce risk of falls?: Yes Life alert?: Yes Use of a cane, walker or w/c?: Yes Grab bars in the bathroom?: Yes Shower chair or bench in shower?: Yes Elevated toilet seat or a handicapped toilet?: No  TIMED UP AND GO:  Was the test performed?  No  Cognitive Function: 6CIT completed    11/25/2023    3:53 PM  MMSE - Mini Mental State Exam  Not completed: Unable to complete        11/25/2023    3:52 PM 12/24/2022   10:13 AM 11/04/2021   10:56 AM  6CIT Screen  What Year? 0 points 0 points 0 points  What month? 0 points 0 points 0 points  What time? 0 points 0 points 0 points  Count back from 20 0 points 0 points 0 points  Months in reverse 0 points 0 points 0 points  Repeat phrase 0 points 0 points 0 points  Total Score 0 points 0 points 0 points    Immunizations Immunization History   Administered Date(s) Administered   Fluad Trivalent(High Dose 65+) 05/31/2023   Influenza Inj Mdck Quad Pf 07/07/2018   Influenza,inj,Quad PF,6+ Mos 06/14/2019, 07/03/2020, 05/26/2021, 07/17/2022   PFIZER Comirnaty(Gray Top)Covid-19 Tri-Sucrose Vaccine 02/26/2021, 07/01/2021   PFIZER(Purple Top)SARS-COV-2 Vaccination 11/23/2019, 12/14/2019, 07/08/2020   PNEUMOCOCCAL CONJUGATE-20 07/21/2022   PPD Test 05/06/2020, 05/05/2021   Pfizer(Comirnaty)Fall Seasonal Vaccine 12 years and older 07/23/2022, 05/31/2023   Pneumococcal Conjugate-13 07/03/2019   Pneumococcal Polysaccharide-23 07/08/2020   Tdap 07/03/2019    Screening Tests Health Maintenance  Topic Date Due   Zoster Vaccines- Shingrix (1 of 2) Never done   FOOT EXAM  05/20/2022   DEXA SCAN  Never done   Lung Cancer Screening  09/25/2023   COVID-19 Vaccine (8 - 2024-25 season) 11/28/2023   OPHTHALMOLOGY EXAM  01/21/2024   HEMOGLOBIN A1C  05/24/2024   Diabetic kidney evaluation - Urine ACR  08/15/2024   Diabetic kidney evaluation - eGFR measurement  08/30/2024   Medicare Annual Wellness (AWV)  11/24/2024   MAMMOGRAM  03/16/2025   Colonoscopy  12/31/2025   DTaP/Tdap/Td (2 - Td or Tdap) 07/02/2029   Pneumonia Vaccine 58+ Years old  Completed   INFLUENZA VACCINE  Completed   Hepatitis C Screening  Completed   HPV VACCINES  Aged Out    Health Maintenance  Health Maintenance Due  Topic Date Due   Zoster Vaccines- Shingrix (1 of 2) Never done   FOOT EXAM  05/20/2022   DEXA SCAN  Never done   Lung Cancer Screening  09/25/2023   COVID-19 Vaccine (8 - 2024-25 season) 11/28/2023   Health Maintenance Items Addressed: Yes; Patient is due for the following: Shingrix and Covid vaccines; Lung Cancer Screening and Bone Density Scan.  Additional Screening:  Vision Screening: Recommended annual ophthalmology exams for early detection of glaucoma and other disorders of the eye.  Dental Screening: Recommended annual dental exams for  proper oral hygiene  Community Resource Referral / Chronic Care Management: CRR required this visit?  No   CCM required this visit?  No     Plan:     I have personally reviewed and noted the following in the patient's chart:   Medical and social history Use of alcohol, tobacco or illicit drugs  Current medications and supplements including opioid prescriptions. Patient is not currently taking opioid prescriptions. Functional ability and status Nutritional status Physical activity Advanced directives List  of other physicians Hospitalizations, surgeries, and ER visits in previous 12 months Vitals Screenings to include cognitive, depression, and falls Referrals and appointments  In addition, I have reviewed and discussed with patient certain preventive protocols, quality metrics, and best practice recommendations. A written personalized care plan for preventive services as well as general preventive health recommendations were provided to patient.     Mickeal Needy, LPN   05/12/5620   After Visit Summary: (MyChart) Due to this being a telephonic visit, the after visit summary with patients personalized plan was offered to patient via MyChart   Notes: Please refer to Routing Comments.

## 2023-11-29 NOTE — Telephone Encounter (Signed)
 Received message from [place], LPN asking to check status of DME order.   Called Linda and left another VM regarding where we need to fax DME order.   Will await response.   Veronda Prude, RN

## 2023-12-01 ENCOUNTER — Other Ambulatory Visit: Payer: Self-pay | Admitting: Pulmonary Disease

## 2023-12-01 ENCOUNTER — Telehealth: Payer: Self-pay | Admitting: Student

## 2023-12-01 DIAGNOSIS — J432 Centrilobular emphysema: Secondary | ICD-10-CM

## 2023-12-01 NOTE — Telephone Encounter (Signed)
 Nurse with [facility] called stating the patient is requesting a rollator. Please reach out to patient with any concerns/questions.

## 2023-12-03 NOTE — Telephone Encounter (Signed)
 Returned call to [facility], she did not answer, left another voice message.   Called patient. She states that [employer] will need updated rx with information that rollator is broken and medical need.   Called [employer]. They asked that we send them another order with this information.   Community message sent to [employer] regarding this order.   Veronda Prude, RN

## 2023-12-06 ENCOUNTER — Other Ambulatory Visit: Payer: Self-pay

## 2023-12-06 ENCOUNTER — Telehealth: Payer: Self-pay

## 2023-12-06 DIAGNOSIS — I70219 Atherosclerosis of native arteries of extremities with intermittent claudication, unspecified extremity: Secondary | ICD-10-CM

## 2023-12-06 NOTE — Telephone Encounter (Signed)
 Returned pt's call left on triage line. She has c/o worsening claudication for about the last month or so; denies rest pain. She has been scheduled for ABIs and to see MD.

## 2023-12-07 ENCOUNTER — Other Ambulatory Visit: Payer: Self-pay | Admitting: *Deleted

## 2023-12-07 DIAGNOSIS — I70219 Atherosclerosis of native arteries of extremities with intermittent claudication, unspecified extremity: Secondary | ICD-10-CM

## 2023-12-07 NOTE — Progress Notes (Signed)
US orders in

## 2023-12-08 ENCOUNTER — Ambulatory Visit (HOSPITAL_COMMUNITY)
Admission: RE | Admit: 2023-12-08 | Discharge: 2023-12-08 | Disposition: A | Discharge status: Home / Self Care | Source: Ambulatory Visit | Attending: Vascular Surgery | Admitting: Vascular Surgery

## 2023-12-08 DIAGNOSIS — I70219 Atherosclerosis of native arteries of extremities with intermittent claudication, unspecified extremity: Secondary | ICD-10-CM | POA: Insufficient documentation

## 2023-12-08 LAB — VAS US ABI WITH/WO TBI

## 2023-12-13 ENCOUNTER — Other Ambulatory Visit: Payer: Self-pay | Admitting: Student

## 2023-12-13 DIAGNOSIS — I10 Essential (primary) hypertension: Secondary | ICD-10-CM

## 2023-12-13 DIAGNOSIS — I152 Hypertension secondary to endocrine disorders: Secondary | ICD-10-CM

## 2023-12-15 NOTE — Progress Notes (Deleted)
 Patient ID: Alexandra Campos, female   DOB: 1955/09/27, 68 y.o.   MRN: [id number]  Reason for Consult: No chief complaint on file.   Referred by Alexandra Dash, DO  Subjective:     HPI  Alexandra Campos is a 68 y.o. female presenting for follow-up of PAD.  She was last seen by Alexandra Moats, PA in January 2025 in which she reports her claudication is tolerable and denied rest pain or tissue loss.  At that time she was also cutting back on cigarettes. Today she reports ***  Past Medical History:  Diagnosis Date   Abnormal ankle brachial index (ABI) 01/14/2020   Abnormal facial hair 11/24/2019   Arthritis    Chronic kidney disease    Complex cyst of right ovary 04/09/2020   Likely a dermoid. Normal CA 125     COPD (chronic obstructive pulmonary disease) (HCC)    Diabetes mellitus without complication (HCC)    GERD (gastroesophageal reflux disease)    Hyperlipidemia    Hypertension    Ovarian cyst 03/09/2020   CA-125 17.3     Peripheral vascular disease (HCC)    Postmenopausal bleeding 02/07/2020   Rash 04/20/2022   Right upper quadrant pain 01/21/2022   Stress incontinence of urine 10/03/2019   Substance abuse (HCC)    ETOH, THC   Wheezing 12/13/2020   Yeast infection involving the vagina and surrounding area 06/10/2020   Family History  Problem Relation Age of Onset   Stomach cancer Mother    Diabetes Mother    Hypertension Mother    Dementia Mother    Diabetes Father    Hypertension Father    Leukemia Father    Diabetes Sister    Diabetes Sister    Diabetes Brother    Hypertension Brother    Colon cancer Brother 50   Diabetes Brother    Diabetes Maternal Grandmother    Hypertension Maternal Grandmother    Diabetes Daughter    Hypertension Daughter    Diabetes Son    Cancer Paternal Uncle        unknown cancer   Colon polyps Neg Hx    Esophageal cancer Neg Hx    Rectal cancer Neg Hx    Breast cancer Neg Hx    Ovarian cancer Neg Hx    Endometrial cancer Neg  Hx    Past Surgical History:  Procedure Laterality Date   ABDOMINAL AORTOGRAM W/LOWER EXTREMITY N/A 03/15/2020   Procedure: ABDOMINAL AORTOGRAM W/LOWER EXTREMITY;  Surgeon: Chuck Hint, MD;  Location: [facility] INVASIVE CV LAB;  Service: Cardiovascular;  Laterality: N/A;   ANKLE SURGERY Right    ORIF   CARPAL TUNNEL RELEASE Right 11/18/2022   Procedure: RIGHT CARPAL TUNNEL RELEASE;  Surgeon: Tarry Kos, MD;  Location:  SURGERY CENTER;  Service: Orthopedics;  Laterality: Right;   DILATATION & CURETTAGE/HYSTEROSCOPY WITH MYOSURE N/A 08/31/2023   Procedure: DILATATION & CURETTAGE/HYSTEROSCOPY WITH MYOSURE;  Surgeon: Clide Cliff, MD;  Location: [facility];  Service: Gynecology;  Laterality: N/A;   ROBOTIC ASSISTED BILATERAL SALPINGO OOPHERECTOMY Bilateral 08/31/2023   Procedure: XI ROBOTIC ASSISTED BILATERAL SALPINGO OOPHORECTOMY;  Surgeon: Clide Cliff, MD;  Location: [facility];  Service: Gynecology;  Laterality: Bilateral;   THROAT SURGERY     Reports polyps removed from throat, done at [facility] approx 15 years ago   TONSILLECTOMY     TUBAL LIGATION Bilateral    WISDOM TOOTH EXTRACTION      Short Social History:  Social History  Tobacco Use   Smoking status: Every Day    Current packs/day: 0.50    Average packs/day: 0.5 packs/day for 53.2 years (26.6 ttl pk-yrs)    Types: Cigarettes    Start date: 1972    Passive exposure: Current   Smokeless tobacco: Never   Tobacco comments:    Patient stated that she quit smoking 10/16/2023.  Substance Use Topics   Alcohol use: Yes    Alcohol/week: 5.0 standard drinks of alcohol    Types: 5 Cans of beer per week    Comment: 5 drinks/week     No Known Allergies  Current Outpatient Medications  Medication Sig Dispense Refill   acetaminophen (TYLENOL) 650 MG CR tablet Take 650 mg by mouth every 8 (eight) hours as needed for pain.     acetaminophen-codeine (TYLENOL #3) 300-30 MG tablet Take 1 tablet by mouth 2 (two) times  daily as needed for moderate pain (pain score 4-6). (Patient not taking: Reported on 11/22/2023) 30 tablet 0   albuterol (VENTOLIN HFA) 108 (90 Base) MCG/ACT inhaler INHALE 2 PUFFS BY MOUTH EVERY 4 HOURS AS NEEDED FOR WHEEZING FOR SHORTNESS OF BREATH 18 g 0   aspirin EC 81 MG tablet Take 81 mg by mouth daily. Swallow whole.     atorvastatin (LIPITOR) 40 MG tablet Take 1 tablet by mouth once daily 90 tablet 0   buPROPion (WELLBUTRIN SR) 150 MG 12 hr tablet Take 1 tablet (150 mg total) by mouth 2 (two) times daily. 60 tablet 3   cetirizine (ZYRTEC) 10 MG tablet Take 1 tablet (10 mg total) by mouth daily. 30 tablet 0   chlorthalidone (HYGROTON) 25 MG tablet Take 1 tablet (25 mg total) by mouth daily. 90 tablet 3   empagliflozin (JARDIANCE) 25 MG TABS tablet Take 1 tablet (25 mg total) by mouth daily. 90 tablet 3   fluticasone-salmeterol (WIXELA INHUB) 250-50 MCG/ACT AEPB Inhale 1 puff into the lungs in the morning and at bedtime. 60 each 5   losartan (COZAAR) 100 MG tablet Take 1 tablet (100 mg total) by mouth at bedtime. 90 tablet 3   melatonin 3 MG TABS tablet Take 1 tablet (3 mg total) by mouth at bedtime. 90 tablet 0   metFORMIN (GLUCOPHAGE-XR) 500 MG 24 hr tablet TAKE 2 TABLETS BY MOUTH IN THE MORNING AND AT BEDTIME 360 tablet 0   Misc. Devices (ROLLATOR ULTRA-LIGHT) MISC Prior rollator dispensed in 2023 is irretrievably broken  Currently using a cane Cannot ambulate > 300 feet without fatigue, pain, shortness of breath Has multiple chronic conditions making it difficult to ambulate without assistance (COPD and multilevel osteoarthritis) High risk for falls without assistive device 1 each 0   Multiple Vitamin (MULTIVITAMIN PO) Take 1 tablet by mouth daily.     nystatin cream (MYCOSTATIN) Apply 1 Application topically 2 (two) times daily. 30 g 0   omeprazole (PRILOSEC) 20 MG capsule Take 20 mg by mouth daily.     OZEMPIC, 1 MG/DOSE, 4 MG/3ML SOPN INJECT 1 MG INTO THE SKIN ONCE A WEEK 3 mL 0    SPIRIVA RESPIMAT 2.5 MCG/ACT AERS INHALE 2 SPRAY(S) BY MOUTH ONCE DAILY 4 g 0   No current facility-administered medications for this visit.    REVIEW OF SYSTEMS  Positive for ***  All other systems were reviewed and are negative     Objective:  Objective   There were no vitals filed for this visit. There is no height or weight on file to calculate BMI.  Physical  Exam General: no acute distress Cardiac: hemodynamically stable Pulm: normal work of breathing Neuro: alert, no focal deficit Extremities: no edema, cyanosis or wounds*** Vascular:   Right: ***  Left: ***   Data: ABI +---------+------------------+-----+----------+--------+  Right   Rt Pressure (mmHg)IndexWaveform  Comment   +---------+------------------+-----+----------+--------+  Brachial 141                                        +---------+------------------+-----+----------+--------+  ATA     255               1.81 monophasic          +---------+------------------+-----+----------+--------+  PTA     255               1.81 monophasic          +---------+------------------+-----+----------+--------+  Great Toe31                0.22                     +---------+------------------+-----+----------+--------+   +---------+------------------+-----+----------+-------+  Left    Lt Pressure (mmHg)IndexWaveform  Comment  +---------+------------------+-----+----------+-------+  Brachial 139                                       +---------+------------------+-----+----------+-------+  ATA     255               1.81 monophasic         +---------+------------------+-----+----------+-------+  PTA     255               1.81 monophasic         +---------+------------------+-----+----------+-------+  Great Toe40                0.28                    +---------+------------------+-----+----------+-------+    +-------+-----------+-----------+------------+------------+  ABI/TBIToday's ABIToday's TBIPrevious ABIPrevious TBI  +-------+-----------+-----------+------------+------------+  Right [place]         0.22       [place]          0.26          +-------+-----------+-----------+------------+------------+  Left  [place]         0.28       [place]          0.34          +-------+-----------+-----------+------------+------------+   Angiogram from 2021 independently reviewed Bilateral flush occlusions of the SFA with reconstitution of the P2 segment of the popliteals with three-vessel runoff bilaterally  Vein mapping ***     Assessment/Plan:     MARQUITE ATTWOOD is a 68 y.o. female with PAD and claudication with known bilateral flush SFA occlusions.  She reports her claudication is worsened although still denies rest pain or nonhealing wounds. I explained that in 2021 it appeared that she would need femoral to below-knee popliteal artery bypasses but since her angiogram was about 4 years ago and she has continued to smoke I would like to repeat an angiogram in order to determine if her target has changed over there has been worsening of her outflow prior to offering bypass.  Will plan for ***   Recommendations to optimize cardiovascular risk: Abstinence from all tobacco products. Blood glucose  control with goal A1c < 7%. Blood pressure control with goal blood pressure < 140/90 mmHg. Lipid reduction therapy with goal LDL-C <100 mg/dL  Aspirin 81mg  PO QD.  Atorvastatin 40-80mg  PO QD (or other "high intensity" statin therapy).     Daria Pastures MD [facility]

## 2023-12-16 ENCOUNTER — Other Ambulatory Visit: Payer: Self-pay | Admitting: *Deleted

## 2023-12-16 DIAGNOSIS — I70219 Atherosclerosis of native arteries of extremities with intermittent claudication, unspecified extremity: Secondary | ICD-10-CM

## 2023-12-17 ENCOUNTER — Encounter: Payer: Self-pay | Admitting: Vascular Surgery

## 2023-12-17 ENCOUNTER — Ambulatory Visit: Admitting: Vascular Surgery

## 2023-12-17 ENCOUNTER — Ambulatory Visit (HOSPITAL_COMMUNITY)

## 2023-12-17 ENCOUNTER — Ambulatory Visit (HOSPITAL_COMMUNITY)
Admission: RE | Admit: 2023-12-17 | Discharge: 2023-12-17 | Disposition: A | Discharge status: Home / Self Care | Source: Ambulatory Visit | Attending: Vascular Surgery | Admitting: Vascular Surgery

## 2023-12-17 ENCOUNTER — Ambulatory Visit (HOSPITAL_COMMUNITY): Attending: Vascular Surgery

## 2023-12-17 VITALS — BP 164/82 | HR 85 | Temp 97.9°F | Ht 60.0 in | Wt 188.0 lb

## 2023-12-17 DIAGNOSIS — I70219 Atherosclerosis of native arteries of extremities with intermittent claudication, unspecified extremity: Secondary | ICD-10-CM | POA: Insufficient documentation

## 2023-12-17 DIAGNOSIS — I739 Peripheral vascular disease, unspecified: Secondary | ICD-10-CM | POA: Diagnosis not present

## 2023-12-17 MED ORDER — CILOSTAZOL 100 MG PO TABS
100.0000 mg | ORAL_TABLET | Freq: Two times a day (BID) | ORAL | 11 refills | Status: DC
Start: 2023-12-17 — End: 2024-01-20

## 2023-12-17 NOTE — Progress Notes (Signed)
 Patient ID: Alexandra Campos, female   DOB: [birth year], 68 y.o.   MRN: [id number]  Reason for Consult: Follow-up   Referred by Darral Dash, DO  Subjective:     HPI  Alexandra Campos is a 68 y.o. female presenting for follow-up of PAD.  She was last seen by Aggie Moats, PA in January 2025 in which she reports her claudication is tolerable and denied rest pain or tissue loss.  At that time she was also cutting back on cigarettes. Today she reports she has quit smoking although she still has claudication.  She reports she could not walk around her apartment building about 3 times before needing to stop and rest.  She was under the impression that she quit smoking that her pain would resolve.  She denies nocturnal rest pain.  She denies nonhealing wounds.  Past Medical History:  Diagnosis Date  . Abnormal ankle brachial index (ABI) 01/14/2020  . Abnormal facial hair 11/24/2019  . Arthritis   . Chronic kidney disease   . Complex cyst of right ovary 04/09/2020   Likely a dermoid. Normal CA 125    . COPD (chronic obstructive pulmonary disease) (HCC)   . Diabetes mellitus without complication (HCC)   . GERD (gastroesophageal reflux disease)   . Hyperlipidemia   . Hypertension   . Ovarian cyst 03/09/2020   CA-125 17.3    . Peripheral vascular disease (HCC)   . Postmenopausal bleeding 02/07/2020  . Rash 04/20/2022  . Right upper quadrant pain 01/21/2022  . Stress incontinence of urine 10/03/2019  . Substance abuse (HCC)    ETOH, THC  . Wheezing 12/13/2020  . Yeast infection involving the vagina and surrounding area 06/10/2020   Family History  Problem Relation Age of Onset  . Stomach cancer Mother   . Diabetes Mother   . Hypertension Mother   . Dementia Mother   . Diabetes Father   . Hypertension Father   . Leukemia Father   . Diabetes Sister   . Diabetes Sister   . Diabetes Brother   . Hypertension Brother   . Colon cancer Brother 50  . Diabetes Brother   . Diabetes  Maternal Grandmother   . Hypertension Maternal Grandmother   . Diabetes Daughter   . Hypertension Daughter   . Diabetes Son   . Cancer Paternal Uncle        unknown cancer  . Colon polyps Neg Hx   . Esophageal cancer Neg Hx   . Rectal cancer Neg Hx   . Breast cancer Neg Hx   . Ovarian cancer Neg Hx   . Endometrial cancer Neg Hx    Past Surgical History:  Procedure Laterality Date  . ABDOMINAL AORTOGRAM W/LOWER EXTREMITY N/A 03/15/2020   Procedure: ABDOMINAL AORTOGRAM W/LOWER EXTREMITY;  Surgeon: Chuck Hint, MD;  Location: [facility] INVASIVE CV LAB;  Service: Cardiovascular;  Laterality: N/A;  . ANKLE SURGERY Right    ORIF  . CARPAL TUNNEL RELEASE Right 11/18/2022   Procedure: RIGHT CARPAL TUNNEL RELEASE;  Surgeon: Tarry Kos, MD;  Location: [facility];  Service: Orthopedics;  Laterality: Right;  . DILATATION & CURETTAGE/HYSTEROSCOPY WITH MYOSURE N/A 08/31/2023   Procedure: DILATATION & CURETTAGE/HYSTEROSCOPY WITH MYOSURE;  Surgeon: Clide Cliff, MD;  Location: [facility];  Service: Gynecology;  Laterality: N/A;  . ROBOTIC ASSISTED BILATERAL SALPINGO OOPHERECTOMY Bilateral 08/31/2023   Procedure: XI ROBOTIC ASSISTED BILATERAL SALPINGO OOPHORECTOMY;  Surgeon: Clide Cliff, MD;  Location: [facility];  Service: Gynecology;  Laterality: Bilateral;  . THROAT SURGERY     Reports polyps removed from throat, done at [facility] approx 15 years ago  . TONSILLECTOMY    . TUBAL LIGATION Bilateral   . WISDOM TOOTH EXTRACTION      Short Social History:  Social History   Tobacco Use  . Smoking status: Every Day    Current packs/day: 0.50    Average packs/day: 0.5 packs/day for 53.3 years (26.6 ttl pk-yrs)    Types: Cigarettes    Start date: 1972    Passive exposure: Current  . Smokeless tobacco: Never  . Tobacco comments:    Patient stated that she quit smoking 10/16/2023.  Substance Use Topics  . Alcohol use: Yes    Alcohol/week: 5.0 standard drinks of alcohol     Types: 5 Cans of beer per week    Comment: 5 drinks/week     No Known Allergies  Current Outpatient Medications  Medication Sig Dispense Refill  . acetaminophen (TYLENOL) 650 MG CR tablet Take 650 mg by mouth every 8 (eight) hours as needed for pain.    Marland Kitchen acetaminophen-codeine (TYLENOL #3) 300-30 MG tablet Take 1 tablet by mouth 2 (two) times daily as needed for moderate pain (pain score 4-6). 30 tablet 0  . albuterol (VENTOLIN HFA) 108 (90 Base) MCG/ACT inhaler INHALE 2 PUFFS BY MOUTH EVERY 4 HOURS AS NEEDED FOR WHEEZING FOR SHORTNESS OF BREATH 18 g 0  . aspirin EC 81 MG tablet Take 81 mg by mouth daily. Swallow whole.    Marland Kitchen atorvastatin (LIPITOR) 40 MG tablet Take 1 tablet by mouth once daily 90 tablet 0  . buPROPion (WELLBUTRIN SR) 150 MG 12 hr tablet Take 1 tablet (150 mg total) by mouth 2 (two) times daily. 60 tablet 3  . cetirizine (ZYRTEC) 10 MG tablet Take 1 tablet (10 mg total) by mouth daily. 30 tablet 0  . chlorthalidone (HYGROTON) 25 MG tablet Take 1 tablet (25 mg total) by mouth daily. 90 tablet 3  . cilostazol (PLETAL) 100 MG tablet Take 1 tablet (100 mg total) by mouth 2 (two) times daily before a meal. 60 tablet 11  . empagliflozin (JARDIANCE) 25 MG TABS tablet Take 1 tablet (25 mg total) by mouth daily. 90 tablet 3  . fluticasone-salmeterol (WIXELA INHUB) 250-50 MCG/ACT AEPB Inhale 1 puff into the lungs in the morning and at bedtime. 60 each 5  . losartan (COZAAR) 100 MG tablet Take 1 tablet (100 mg total) by mouth at bedtime. 90 tablet 3  . melatonin 3 MG TABS tablet Take 1 tablet (3 mg total) by mouth at bedtime. 90 tablet 0  . metFORMIN (GLUCOPHAGE-XR) 500 MG 24 hr tablet TAKE 2 TABLETS BY MOUTH IN THE MORNING AND AT BEDTIME 360 tablet 0  . Misc. Devices (ROLLATOR ULTRA-LIGHT) MISC Prior rollator dispensed in 2023 is irretrievably broken  Currently using a cane Cannot ambulate > 300 feet without fatigue, pain, shortness of breath Has multiple chronic conditions making it  difficult to ambulate without assistance (COPD and multilevel osteoarthritis) High risk for falls without assistive device 1 each 0  . Multiple Vitamin (MULTIVITAMIN PO) Take 1 tablet by mouth daily.    Marland Kitchen nystatin cream (MYCOSTATIN) Apply 1 Application topically 2 (two) times daily. 30 g 0  . omeprazole (PRILOSEC) 20 MG capsule Take 20 mg by mouth daily.    Marland Kitchen OZEMPIC, 1 MG/DOSE, 4 MG/3ML SOPN INJECT 1 MG INTO THE SKIN ONCE A WEEK 3 mL 0  . SPIRIVA RESPIMAT 2.5  MCG/ACT AERS INHALE 2 SPRAY(S) BY MOUTH ONCE DAILY 4 g 0   No current facility-administered medications for this visit.    REVIEW OF SYSTEMS   All other systems were reviewed and are negative     Objective:  Objective   Vitals:   12/17/23 1252  BP: (!) 164/82  Pulse: 85  Temp: 97.9 F (36.6 C)  SpO2: 98%  Weight: 188 lb (85.3 kg)  Height: 5' (1.524 m)   Body mass index is 36.72 kg/m.  Physical Exam General: no acute distress Cardiac: hemodynamically stable Pulm: normal work of breathing Neuro: alert, no focal deficit Extremities: no edema, cyanosis or wounds    Data: ABI +---------+------------------+-----+----------+--------+  Right   Rt Pressure (mmHg)IndexWaveform  Comment   +---------+------------------+-----+----------+--------+  Brachial 141                                        +---------+------------------+-----+----------+--------+  ATA     255               1.81 monophasic          +---------+------------------+-----+----------+--------+  PTA     255               1.81 monophasic          +---------+------------------+-----+----------+--------+  Great Toe31                0.22                     +---------+------------------+-----+----------+--------+   +---------+------------------+-----+----------+-------+  Left    Lt Pressure (mmHg)IndexWaveform  Comment  +---------+------------------+-----+----------+-------+  Brachial 139                                        +---------+------------------+-----+----------+-------+  ATA     255               1.81 monophasic         +---------+------------------+-----+----------+-------+  PTA     255               1.81 monophasic         +---------+------------------+-----+----------+-------+  Great Toe40                0.28                    +---------+------------------+-----+----------+-------+   +-------+-----------+-----------+------------+------------+  ABI/TBIToday's ABIToday's TBIPrevious ABIPrevious TBI  +-------+-----------+-----------+------------+------------+  Right [place]         0.22       [place]          0.26          +-------+-----------+-----------+------------+------------+  Left  [place]         0.28       [place]          0.34          +-------+-----------+-----------+------------+------------+   Angiogram from 2021 independently reviewed Bilateral flush occlusions of the SFA with reconstitution of the P2 segment of the popliteals with three-vessel runoff bilaterally  Vein mapping +--------------+-------------+----------------------+--------------+-------  ----+   RT Diameter   RT Findings          GSV           LT Diameter  LT  Findings      (  cm)                                             (cm)                   +--------------+-------------+----------------------+--------------+-------  ----+      0.63                      Saphenofemoral         0.48                                                     Junction                                   +--------------+-------------+----------------------+--------------+-------  ----+      0.52                      Proximal thigh     0.59 / 0.38                +--------------+-------------+----------------------+--------------+-------  ----+   0.43 / 0.28                     Mid thigh            0.29                    +--------------+-------------+----------------------+--------------+-------  ----+                    not          Distal thigh      0.29 / 0.31                               visualized                                                    +--------------+-------------+----------------------+--------------+-------  ----+                    not              Knee              0.19                                  visualized                                                    +--------------+-------------+----------------------+--------------+-------  ----+                    not           Prox  calf            0.21                                  visualized                                                    +--------------+-------------+----------------------+--------------+-------  ----+                    not            Mid calf            0.17                                  visualized                                                    +--------------+-------------+----------------------+--------------+-------  ----+                    not          Distal calf           0.21                                  visualized                                                    +--------------+-------------+----------------------+--------------+-------  ----+      Assessment/Plan:     MILLIANA REDDOCH is a 68 y.o. female with PAD and claudication with known bilateral flush SFA occlusions.  She reports her claudication has not improved despite smoking cessation. We discussed that given her anatomy she would only be a candidate for a bypass but that at this time I would not recommend bypass given that she does not have rest pain or nonhealing wounds. She expressed understanding and agrees, she is not interested in surgery.  Unfortunately she was just under the assumption that her lower extremity symptoms would vastly improve or even  resolve if she quit smoking.  I explained that quitting smoking was important in order to minimize progression and minimize the potential future risk of amputation although would not necessarily resolve her symptoms and explained that her arterial occlusions would not spontaneously recanalize.  She expressed understanding and was interested to know about a medication that can help with her claudication.  We discussed the risk and benefits of Pletal.  I explained that I will prescribe a trial but should she develop chest pain or shortness of breath she was instructed to stop Pletal.  Follow up in 12 months with ABI     Daria Pastures MD [facility]

## 2023-12-20 ENCOUNTER — Telehealth: Payer: Self-pay | Admitting: Orthopaedic Surgery

## 2023-12-20 ENCOUNTER — Other Ambulatory Visit: Payer: Self-pay

## 2023-12-20 ENCOUNTER — Telehealth: Payer: Self-pay

## 2023-12-20 DIAGNOSIS — M1712 Unilateral primary osteoarthritis, left knee: Secondary | ICD-10-CM

## 2023-12-20 NOTE — Telephone Encounter (Signed)
 Patient calls nurse line regarding recent medication prescribed by Vein and Vascular provider.   She is asking if it would be safe for her to take the cilostazol 100 mg with her other medications.   Advised that I would forward message to PCP.   Veronda Prude, RN

## 2023-12-20 NOTE — Telephone Encounter (Signed)
 Called and left a VM for patient to CB to schedule for gel injection with Dr. Roda Shutters or Mardella Layman.  See referrals tab

## 2023-12-20 NOTE — Telephone Encounter (Signed)
 Patient returns call to nurse line.   Advised on below.   All questions answered.

## 2023-12-20 NOTE — Telephone Encounter (Signed)
 Called patient and informed her that it is sfae per Dr. Melissa Noon to take meds.  Patient verbalized understanding and states that she "wants to get off of some of the medications" which she will discuss with Dr. Melissa Noon at the next appointment. States that her blood sugars have been "awesome".  Glennie Hawk, CMA

## 2023-12-20 NOTE — Telephone Encounter (Signed)
 Called patient. LVM asking that she return call to office.   Veronda Prude, RN

## 2023-12-20 NOTE — Telephone Encounter (Signed)
 Pt checking the status of the gel injection approval

## 2023-12-22 ENCOUNTER — Ambulatory Visit: Admitting: Physician Assistant

## 2023-12-22 ENCOUNTER — Encounter: Payer: Self-pay | Admitting: Physician Assistant

## 2023-12-22 ENCOUNTER — Telehealth: Payer: Self-pay

## 2023-12-22 ENCOUNTER — Other Ambulatory Visit: Payer: Self-pay | Admitting: Physician Assistant

## 2023-12-22 DIAGNOSIS — M170 Bilateral primary osteoarthritis of knee: Secondary | ICD-10-CM | POA: Diagnosis not present

## 2023-12-22 DIAGNOSIS — M1712 Unilateral primary osteoarthritis, left knee: Secondary | ICD-10-CM

## 2023-12-22 MED ORDER — IBUPROFEN 600 MG PO TABS: 600.0000 mg | ORAL_TABLET | Freq: Two times a day (BID) | ORAL | 0 refills | Status: DC | PRN

## 2023-12-22 MED ORDER — LIDOCAINE HCL 1 % IJ SOLN
2.0000 mL | INTRAMUSCULAR | Status: AC | PRN
Start: 2023-12-22 — End: 2023-12-22
  Administered 2023-12-22: 2 mL

## 2023-12-22 MED ORDER — BUPIVACAINE HCL 0.25 % IJ SOLN
2.0000 mL | INTRAMUSCULAR | Status: AC | PRN
Start: 2023-12-22 — End: 2023-12-22
  Administered 2023-12-22: 2 mL via INTRA_ARTICULAR

## 2023-12-22 MED ORDER — HYALURONAN 88 MG/4ML IX SOSY
88.0000 mg | PREFILLED_SYRINGE | INTRA_ARTICULAR | Status: AC | PRN
  Administered 2023-12-22: 88 mg via INTRA_ARTICULAR

## 2023-12-22 NOTE — Progress Notes (Signed)
 Office Visit Note   Patient: Alexandra Campos           Date of Birth: 1956-09-14           MRN: [id number] Visit Date: 12/22/2023              Requested by: Darral Dash, DO [address] [place],  [place] [address] PCP: Darral Dash, DO   Assessment & Plan: Visit Diagnoses:  1. Bilateral primary osteoarthritis of knee     Plan: Impression is bilateral knee osteoarthritis.  Today, we proceeded with Monovisc injection to the left knee.  Will obtain approval to proceed with Monovisc injection to the right knee as cortisone injections have provided minimal relief in the past.  She will follow-up once approved for right knee Monovisc injection.  Call with concerns or questions.  This patient is diagnosed with osteoarthritis of the knee(s).    Radiographs show evidence of joint space narrowing, osteophytes, subchondral sclerosis and/or subchondral cysts.  This patient has knee pain which interferes with functional and activities of daily living.    This patient has experienced inadequate response, adverse effects and/or intolerance with conservative treatments such as acetaminophen, NSAIDS, topical creams, physical therapy or regular exercise, knee bracing and/or weight loss.   This patient has experienced inadequate response or has a contraindication to intra articular steroid injections for at least 3 months.   This patient is not scheduled to have a total knee replacement within 6 months of starting treatment with viscosupplementation.   Follow-Up Instructions: Return for f/u for right knee gel inj.   Orders:  Orders Placed This Encounter  Procedures   Large Joint Inj: L knee   No orders of the defined types were placed in this encounter.     Procedures: Large Joint Inj: L knee on 12/22/2023 9:27 AM Indications: pain Details: 22 G needle, anterolateral approach Medications: 2 mL lidocaine 1 %; 2 mL bupivacaine 0.25 %; 88 mg Hyaluronan 88 MG/4ML      Clinical  Data: No additional findings.   Subjective: Chief Complaint  Patient presents with   Left Knee - Follow-up    monovisc    HPI patient is a pleasant 68 year old female with bilateral knee osteoarthritis who comes in today for left knee Monovisc injection.  She is also complaining of recurrent right knee pain.  She was seen by me in January where cortisone injection was performed.  Only temporary relief from this.  She continues to have pain worse with activity.  She is requesting gel injection to the right knee.  Review of Systems as detailed in HPI.  All others reviewed and are negative.   Objective: Vital Signs: There were no vitals taken for this visit.  Physical Exam well-developed well-nourished female in no acute distress.  Alert and oriented x 3.  Ortho Exam unchanged bilateral knee exam  Specialty Comments:  No specialty comments available.  Imaging: No new imaging   PMFS History: Patient Active Problem List   Diagnosis Date Noted   Leg swelling 09/24/2023   Tobacco use 09/24/2023   Adnexal mass 08/31/2023   Pre-op evaluation 08/16/2023   COPD with acute exacerbation (HCC) 08/03/2023   Obstructive lung disease (HCC) 07/09/2022   Low back pain 09/22/2020   Atherosclerosis of artery of extremity with rest pain (HCC) 03/15/2020   Ovarian cyst 03/09/2020   Postmenopausal bleeding 02/07/2020   Type 2 DM with CKD stage 3 and hypertension (HCC) 07/03/2019   Class 3 severe obesity  with body mass index (BMI) of 40.0 to 44.9 in adult [facility]) 06/16/2019   CKD (chronic kidney disease) stage 3, GFR 30-59 ml/min (HCC) 06/16/2019   Healthcare maintenance 06/16/2019   Carpal tunnel syndrome on right 06/16/2019   Hypertension 08/04/2018   Tobacco dependence 08/04/2018   Past Medical History:  Diagnosis Date   Abnormal ankle brachial index (ABI) 01/14/2020   Abnormal facial hair 11/24/2019   Arthritis    Chronic kidney disease    Complex cyst of right ovary 04/09/2020    Likely a dermoid. Normal CA 125     COPD (chronic obstructive pulmonary disease) (HCC)    Diabetes mellitus without complication (HCC)    GERD (gastroesophageal reflux disease)    Hyperlipidemia    Hypertension    Ovarian cyst 03/09/2020   CA-125 17.3     Peripheral vascular disease (HCC)    Postmenopausal bleeding 02/07/2020   Rash 04/20/2022   Right upper quadrant pain 01/21/2022   Stress incontinence of urine 10/03/2019   Substance abuse (HCC)    ETOH, THC   Wheezing 12/13/2020   Yeast infection involving the vagina and surrounding area 06/10/2020    Family History  Problem Relation Age of Onset   Stomach cancer Mother    Diabetes Mother    Hypertension Mother    Dementia Mother    Diabetes Father    Hypertension Father    Leukemia Father    Diabetes Sister    Diabetes Sister    Diabetes Brother    Hypertension Brother    Colon cancer Brother 50   Diabetes Brother    Diabetes Maternal Grandmother    Hypertension Maternal Grandmother    Diabetes Daughter    Hypertension Daughter    Diabetes Son    Cancer Paternal Uncle        unknown cancer   Colon polyps Neg Hx    Esophageal cancer Neg Hx    Rectal cancer Neg Hx    Breast cancer Neg Hx    Ovarian cancer Neg Hx    Endometrial cancer Neg Hx     Past Surgical History:  Procedure Laterality Date   ABDOMINAL AORTOGRAM W/LOWER EXTREMITY N/A 03/15/2020   Procedure: ABDOMINAL AORTOGRAM W/LOWER EXTREMITY;  Surgeon: Chuck Hint, MD;  Location: [facility] INVASIVE CV LAB;  Service: Cardiovascular;  Laterality: N/A;   ANKLE SURGERY Right    ORIF   CARPAL TUNNEL RELEASE Right 11/18/2022   Procedure: RIGHT CARPAL TUNNEL RELEASE;  Surgeon: Tarry Kos, MD;  Location:  SURGERY CENTER;  Service: Orthopedics;  Laterality: Right;   DILATATION & CURETTAGE/HYSTEROSCOPY WITH MYOSURE N/A 08/31/2023   Procedure: DILATATION & CURETTAGE/HYSTEROSCOPY WITH MYOSURE;  Surgeon: Clide Cliff, MD;  Location: [facility];   Service: Gynecology;  Laterality: N/A;   ROBOTIC ASSISTED BILATERAL SALPINGO OOPHERECTOMY Bilateral 08/31/2023   Procedure: XI ROBOTIC ASSISTED BILATERAL SALPINGO OOPHORECTOMY;  Surgeon: Clide Cliff, MD;  Location: [facility];  Service: Gynecology;  Laterality: Bilateral;   THROAT SURGERY     Reports polyps removed from throat, done at [facility] approx 15 years ago   TONSILLECTOMY     TUBAL LIGATION Bilateral    WISDOM TOOTH EXTRACTION     Social History   Occupational History   Occupation: CNA    Comment: Retired  Tobacco Use   Smoking status: Every Day    Current packs/day: 0.50    Average packs/day: 0.5 packs/day for 53.3 years (26.6 ttl pk-yrs)    Types: Cigarettes    Start  date: 1972    Passive exposure: Current   Smokeless tobacco: Never   Tobacco comments:    Patient stated that she quit smoking 10/16/2023.  Vaping Use   Vaping status: Never Used  Substance and Sexual Activity   Alcohol use: Yes    Alcohol/week: 5.0 standard drinks of alcohol    Types: 5 Cans of beer per week    Comment: 5 drinks/week    Drug use: Yes    Types: Marijuana    Comment: last smoked cig today, last marijuana 11/16/2022   Sexual activity: Not Currently

## 2023-12-22 NOTE — Telephone Encounter (Signed)
 Please precert for right knee gel injection. Lindsey's patient.

## 2023-12-23 ENCOUNTER — Other Ambulatory Visit: Payer: Self-pay | Admitting: Pulmonary Disease

## 2023-12-23 DIAGNOSIS — J432 Centrilobular emphysema: Secondary | ICD-10-CM

## 2023-12-25 ENCOUNTER — Other Ambulatory Visit: Payer: Self-pay | Admitting: Student

## 2023-12-29 ENCOUNTER — Encounter: Payer: Self-pay | Admitting: Student

## 2023-12-29 ENCOUNTER — Ambulatory Visit (INDEPENDENT_AMBULATORY_CARE_PROVIDER_SITE_OTHER): Admitting: Student

## 2023-12-29 VITALS — BP 154/111 | HR 80 | Ht 60.0 in | Wt 187.2 lb

## 2023-12-29 DIAGNOSIS — N183 Chronic kidney disease, stage 3 unspecified: Secondary | ICD-10-CM

## 2023-12-29 DIAGNOSIS — E1122 Type 2 diabetes mellitus with diabetic chronic kidney disease: Secondary | ICD-10-CM

## 2023-12-29 DIAGNOSIS — Z23 Encounter for immunization: Secondary | ICD-10-CM

## 2023-12-29 DIAGNOSIS — I10 Essential (primary) hypertension: Secondary | ICD-10-CM | POA: Diagnosis not present

## 2023-12-29 DIAGNOSIS — I129 Hypertensive chronic kidney disease with stage 1 through stage 4 chronic kidney disease, or unspecified chronic kidney disease: Secondary | ICD-10-CM | POA: Diagnosis not present

## 2023-12-29 DIAGNOSIS — Z7985 Long-term (current) use of injectable non-insulin antidiabetic drugs: Secondary | ICD-10-CM

## 2023-12-29 DIAGNOSIS — Z87891 Personal history of nicotine dependence: Secondary | ICD-10-CM

## 2023-12-29 DIAGNOSIS — Z720 Tobacco use: Secondary | ICD-10-CM | POA: Diagnosis not present

## 2023-12-29 MED ORDER — OMEPRAZOLE 20 MG PO CPDR
20.0000 mg | DELAYED_RELEASE_CAPSULE | Freq: Every day | ORAL | 0 refills | Status: DC
Start: 2023-12-29 — End: 2024-05-16

## 2023-12-29 MED ORDER — METFORMIN HCL ER 500 MG PO TB24: 500.0000 mg | ORAL_TABLET | Freq: Every day | ORAL | Status: DC

## 2023-12-29 MED ORDER — LOSARTAN POTASSIUM 100 MG PO TABS: 100.0000 mg | ORAL_TABLET | Freq: Every day | ORAL | 3 refills | Status: AC

## 2023-12-29 NOTE — Progress Notes (Addendum)
    SUBJECTIVE:   CHIEF COMPLAINT / HPI:   Alexandra Campos is a 68 year-old   Prior smoker/COPD -She completely quit smoking last month! She tried to smoke 1/2 cigarette but it made her feel sick and she did not like it -Started smoking at age 47 years old, smoked 1 PPD for 54 years - Due for annual CT lung cancer screening  T2DM -Taking Metformin 500 mg daily, Ozempic 1 mg weekly, Jardiance 25 mg daily -Reports her average CBG is 96. -Feeling really good about her diabetes overall, she wants to get off of her Metformin at some point -She never gets hypoglycemia <70 and never feels  HTN -Took both of her medications this morning, but she accidentally took 1 of her 50 mg tablets instead of the 100 mg tablet (which is what she is taking now) -Took her chlorthalidone as well - No chest pain, swelling in legs  PERTINENT  PMH / PSH:  T2DM HLD PAD  OBJECTIVE:   BP (!) 154/111   Pulse 80   Ht 5' (1.524 m)   Wt 187 lb 3.2 oz (84.9 kg)   SpO2 100%   BMI 36.56 kg/m   General: Pleasant, chronically ill appearing CV: RRR Respiratory: Normal effort on room air. Speaks in full sentences without difficulty. Good air movement, diminished sounds Ext: Warm, well-perfused with no edema  ASSESSMENT/PLAN:   Hypertension Elevated x 2 today, not at goal Likely because she took half of her losartan dose this morning Will follow-up in 2 weeks for blood pressure check and make sure that her current regimen is sufficient - Continue losartan 100 mg daily - Continue chlorthalidone 25 mg daily  Tobacco use Very proud of [name] for her complete cessation in the past month, offered praises to the patient - Ordered CT lung cancer screening, scheduled before patient left clinic today  Type 2 DM with CKD stage 3 and hypertension (HCC) Great control on current regimen Urine microalbumin collected today - Continue Ozempic, Jardiance and metformin with no changes today     Alexandra Kowalewski,  DO [facility]

## 2023-12-29 NOTE — Assessment & Plan Note (Signed)
 Elevated x 2 today, not at goal Likely because she took half of her losartan dose this morning Will follow-up in 2 weeks for blood pressure check and make sure that her current regimen is sufficient - Continue losartan 100 mg daily - Continue chlorthalidone 25 mg daily

## 2023-12-29 NOTE — Patient Instructions (Addendum)
 It was great seeing you today.  As we discussed, - For memory changes: Meditteranean diet, Playing games/puzzles, Stay social with friends and family - Keep up the AMAZING work with going to the gym and staying away from smoking - Please go to [facility] at the scheduled time to get your CT scan completed   If you have any questions or concerns, please feel free to call the clinic.   Have a wonderful day,  Dr. Vallorie Gayer [facility] [phone]

## 2023-12-29 NOTE — Assessment & Plan Note (Signed)
 Very proud of Alexandra Campos for her complete cessation in the past month, offered praises to the patient - Ordered CT lung cancer screening, scheduled before patient left clinic today

## 2023-12-29 NOTE — Assessment & Plan Note (Addendum)
 Great control on current regimen Urine microalbumin collected today - Continue Ozempic, Jardiance and metformin with no changes today

## 2023-12-30 LAB — MICROALBUMIN / CREATININE URINE RATIO
Creatinine, Urine: 162.5 mg/dL
Microalb/Creat Ratio: 15 mg/g{creat} (ref 0–29)
Microalbumin, Urine: 23.8 ug/mL

## 2024-01-04 ENCOUNTER — Telehealth: Payer: Self-pay | Admitting: Physician Assistant

## 2024-01-04 NOTE — Telephone Encounter (Signed)
 VOB submitted for Monovisc, right knee

## 2024-01-04 NOTE — Telephone Encounter (Signed)
 Pt called to check on her gel injection approval. Pease call pt with update at [phone]

## 2024-01-04 NOTE — Telephone Encounter (Signed)
Talked with patient concerning appointment for gel injection.

## 2024-01-05 ENCOUNTER — Other Ambulatory Visit: Payer: Self-pay

## 2024-01-05 DIAGNOSIS — M1711 Unilateral primary osteoarthritis, right knee: Secondary | ICD-10-CM

## 2024-01-06 ENCOUNTER — Ambulatory Visit (INDEPENDENT_AMBULATORY_CARE_PROVIDER_SITE_OTHER): Admitting: Physician Assistant

## 2024-01-06 ENCOUNTER — Ambulatory Visit (HOSPITAL_COMMUNITY)
Admission: RE | Admit: 2024-01-06 | Discharge: 2024-01-06 | Disposition: A | Discharge status: Home / Self Care | Source: Ambulatory Visit | Attending: Family Medicine | Admitting: Family Medicine

## 2024-01-06 DIAGNOSIS — Z87891 Personal history of nicotine dependence: Secondary | ICD-10-CM | POA: Diagnosis not present

## 2024-01-06 DIAGNOSIS — M1711 Unilateral primary osteoarthritis, right knee: Secondary | ICD-10-CM | POA: Diagnosis not present

## 2024-01-06 DIAGNOSIS — F17210 Nicotine dependence, cigarettes, uncomplicated: Secondary | ICD-10-CM | POA: Diagnosis not present

## 2024-01-06 MED ORDER — HYALURONAN 88 MG/4ML IX SOSY
88.0000 mg | PREFILLED_SYRINGE | INTRA_ARTICULAR | Status: AC | PRN
  Administered 2024-01-06: 88 mg via INTRA_ARTICULAR

## 2024-01-06 MED ORDER — LIDOCAINE HCL 1 % IJ SOLN
2.0000 mL | INTRAMUSCULAR | Status: AC | PRN
  Administered 2024-01-06: 2 mL

## 2024-01-06 MED ORDER — BUPIVACAINE HCL 0.25 % IJ SOLN
2.0000 mL | INTRAMUSCULAR | Status: AC | PRN
  Administered 2024-01-06: 2 mL via INTRA_ARTICULAR

## 2024-01-06 NOTE — Progress Notes (Signed)
 Office Visit Note   Patient: Alexandra Campos           Date of Birth: [birth year]           MRN: [id number] Visit Date: 01/06/2024              Requested by: Vallorie Gayer, DO [address] [place],  [place] [address] PCP: Vallorie Gayer, DO   Assessment & Plan: Visit Diagnoses:  1. Unilateral primary osteoarthritis, right knee     Plan: Impression is right knee osteoarthritis.  Today, we proceeded with right knee Monovisc injection.  She tolerated this well.  Follow-up as needed.  Follow-Up Instructions: Return if symptoms worsen or fail to improve.   Orders:  Orders Placed This Encounter  Procedures   Large Joint Inj   No orders of the defined types were placed in this encounter.     Procedures: Large Joint Inj: R knee on 01/06/2024 8:53 AM Indications: pain Details: 22 G needle, anterolateral approach Medications: 2 mL lidocaine  1 %; 2 mL bupivacaine  0.25 %; 88 mg Hyaluronan 88 MG/4ML      Clinical Data: No additional findings.   Subjective: Chief Complaint  Patient presents with   Right Knee - Pain    HPI patient is a pleasant 68 year old female with underlying right knee osteoarthritis who comes in today for right knee Monovisc injection.     Objective: Vital Signs: There were no vitals taken for this visit.    Ortho Exam unchanged right knee exam  Specialty Comments:  No specialty comments available.  Imaging: No new imaging   PMFS History: Patient Active Problem List   Diagnosis Date Noted   Leg swelling 09/24/2023   Tobacco use 09/24/2023   Adnexal mass 08/31/2023   Pre-op evaluation 08/16/2023   COPD with acute exacerbation (HCC) 08/03/2023   Obstructive lung disease (HCC) 07/09/2022   Low back pain 09/22/2020   Atherosclerosis of artery of extremity with rest pain (HCC) 03/15/2020   Ovarian cyst 03/09/2020   Postmenopausal bleeding 02/07/2020   Type 2 DM with CKD stage 3 and hypertension (HCC) 07/03/2019   Class 3 severe  obesity with body mass index (BMI) of 40.0 to 44.9 in adult (HCC) 06/16/2019   CKD (chronic kidney disease) stage 3, GFR 30-59 ml/min (HCC) 06/16/2019   Healthcare maintenance 06/16/2019   Carpal tunnel syndrome on right 06/16/2019   Hypertension 08/04/2018   Tobacco dependence 08/04/2018   Past Medical History:  Diagnosis Date   Abnormal ankle brachial index (ABI) 01/14/2020   Abnormal facial hair 11/24/2019   Arthritis    Chronic kidney disease    Complex cyst of right ovary 04/09/2020   Likely a dermoid. Normal CA 125     COPD (chronic obstructive pulmonary disease) (HCC)    Diabetes mellitus without complication (HCC)    GERD (gastroesophageal reflux disease)    Hyperlipidemia    Hypertension    Ovarian cyst 03/09/2020   CA-125 17.3     Peripheral vascular disease (HCC)    Postmenopausal bleeding 02/07/2020   Rash 04/20/2022   Right upper quadrant pain 01/21/2022   Stress incontinence of urine 10/03/2019   Substance abuse (HCC)    ETOH, THC   Wheezing 12/13/2020   Yeast infection involving the vagina and surrounding area 06/10/2020    Family History  Problem Relation Age of Onset   Stomach cancer Mother    Diabetes Mother    Hypertension Mother    Dementia Mother  Diabetes Father    Hypertension Father    Leukemia Father    Diabetes Sister    Diabetes Sister    Diabetes Brother    Hypertension Brother    Colon cancer Brother 50   Diabetes Brother    Diabetes Maternal Grandmother    Hypertension Maternal Grandmother    Diabetes Daughter    Hypertension Daughter    Diabetes Son    Cancer Paternal Uncle        unknown cancer   Colon polyps Neg Hx    Esophageal cancer Neg Hx    Rectal cancer Neg Hx    Breast cancer Neg Hx    Ovarian cancer Neg Hx    Endometrial cancer Neg Hx     Past Surgical History:  Procedure Laterality Date   ABDOMINAL AORTOGRAM W/LOWER EXTREMITY N/A 03/15/2020   Procedure: ABDOMINAL AORTOGRAM W/LOWER EXTREMITY;  Surgeon: Dannis Dy, MD;  Location: [facility] INVASIVE CV LAB;  Service: Cardiovascular;  Laterality: N/A;   ANKLE SURGERY Right    ORIF   CARPAL TUNNEL RELEASE Right 11/18/2022   Procedure: RIGHT CARPAL TUNNEL RELEASE;  Surgeon: Wes Hamman, MD;  Location: [facility];  Service: Orthopedics;  Laterality: Right;   DILATATION & CURETTAGE/HYSTEROSCOPY WITH MYOSURE N/A 08/31/2023   Procedure: DILATATION & CURETTAGE/HYSTEROSCOPY WITH MYOSURE;  Surgeon: Derrel Flies, MD;  Location: [facility];  Service: Gynecology;  Laterality: N/A;   ROBOTIC ASSISTED BILATERAL SALPINGO OOPHERECTOMY Bilateral 08/31/2023   Procedure: XI ROBOTIC ASSISTED BILATERAL SALPINGO OOPHORECTOMY;  Surgeon: Derrel Flies, MD;  Location: [facility];  Service: Gynecology;  Laterality: Bilateral;   THROAT SURGERY     Reports polyps removed from throat, done at [facility] approx 15 years ago   TONSILLECTOMY     TUBAL LIGATION Bilateral    WISDOM TOOTH EXTRACTION     Social History   Occupational History   Occupation: CNA    Comment: Retired  Tobacco Use   Smoking status: Every Day    Current packs/day: 0.50    Average packs/day: 0.5 packs/day for 53.3 years (26.7 ttl pk-yrs)    Types: Cigarettes    Start date: 1972    Passive exposure: Current   Smokeless tobacco: Never   Tobacco comments:    Patient stated that she quit smoking 10/16/2023.  Vaping Use   Vaping status: Never Used  Substance and Sexual Activity   Alcohol use: Yes    Alcohol/week: 5.0 standard drinks of alcohol    Types: 5 Cans of beer per week    Comment: 5 drinks/week    Drug use: Yes    Types: Marijuana    Comment: last smoked cig today, last marijuana 11/16/2022   Sexual activity: Not Currently

## 2024-01-18 ENCOUNTER — Telehealth: Payer: Self-pay

## 2024-01-18 NOTE — Telephone Encounter (Signed)
 Patient calls nurse line requesting to speak with Dr. Koval.   She reports she got a new phone and reports trouble "hooking my CGM" to it.  Advised with will forward to Koval.

## 2024-01-19 NOTE — Telephone Encounter (Signed)
 Patient contacted for follow-up of request to set-up CGM - [facility] on [address].  Patient encounter to set-up Libre 3 APP on new phone - tomorrow 01/20/2024 at 10:00AM  Total time with patient call and documentation of interaction: 4 minutes.

## 2024-01-20 ENCOUNTER — Other Ambulatory Visit: Payer: Self-pay | Admitting: Family Medicine

## 2024-01-20 ENCOUNTER — Encounter: Payer: Self-pay | Admitting: Pharmacist

## 2024-01-20 ENCOUNTER — Ambulatory Visit: Admitting: Pharmacist

## 2024-01-20 VITALS — BP 159/78 | HR 94 | Wt 188.0 lb

## 2024-01-20 DIAGNOSIS — I129 Hypertensive chronic kidney disease with stage 1 through stage 4 chronic kidney disease, or unspecified chronic kidney disease: Secondary | ICD-10-CM | POA: Diagnosis not present

## 2024-01-20 DIAGNOSIS — Z720 Tobacco use: Secondary | ICD-10-CM

## 2024-01-20 DIAGNOSIS — N183 Chronic kidney disease, stage 3 unspecified: Secondary | ICD-10-CM

## 2024-01-20 DIAGNOSIS — I10 Essential (primary) hypertension: Secondary | ICD-10-CM | POA: Diagnosis not present

## 2024-01-20 DIAGNOSIS — E1122 Type 2 diabetes mellitus with diabetic chronic kidney disease: Secondary | ICD-10-CM

## 2024-01-20 MED ORDER — SPIRONOLACTONE 25 MG PO TABS: 25.0000 mg | ORAL_TABLET | Freq: Every day | ORAL | 1 refills | Status: DC

## 2024-01-20 NOTE — Assessment & Plan Note (Signed)
 Diabetes longstanding currently well-controlled, A1c 6.9. Patient is able to verbalize appropriate hypoglycemia management plan. Medication adherence appears good.  -Continued GLP-1 Ozempic  (semaglutide ) 1 mg weekly on Mondays.  -Continued SGLT2-I Jardiance  (empagliflozin ) 25 mg. Counseled on sick day rules. -Continued metformin  500 mg daily with breakfast.  -Patient educated on purpose, proper use, and potential adverse effects. -Extensively discussed pathophysiology of diabetes, recommended lifestyle interventions, dietary effects on blood sugar control.  -Counseled on s/sx of and management of hypoglycemia.

## 2024-01-20 NOTE — Patient Instructions (Signed)
 It was nice to see you today!  Your goal blood pressure is <140/90 mmHg.  Medication Changes: START spironolactone 25 mg daily in the morning  Continue all other medication the same.  Keep up the good work with diet and exercise. Aim for a diet full of vegetables, fruit and lean meats (chicken, [place], fish). Try to limit salt intake by eating fresh or frozen vegetables (instead of canned), rinse canned vegetables prior to cooking and do not add any additional salt to meals.   Monitor blood pressure at home daily and keep a log (on your phone or piece of paper) to bring with you to your next visit. Write down date, time, blood pressure and pulse.   Please bring all medications to your clinic visits.  Please arrive 10-15 minutes prior to your scheduled visit time.

## 2024-01-20 NOTE — Progress Notes (Signed)
 S:     Chief Complaint  Patient presents with   Medication Management    Diabetes Follow-up   68 y.o. female who presents for diabetes evaluation, education, and management. Patient arrives in decent spirits, and she reports that she quit smoking three weeks ago. She reports increased stress due to recent hospitalization of her ex-husband. She presents with the assistance of a cane.   Patient was referred and last seen by Primary Care Provider, Dr. Thomas Fleischer, on 12/29/2023.  At last visit, no adjustments were made to her medications.   PMH is significant for atherosclerosis of hip artery, COPD, and prior tobacco use.  The patient was enrolled in the Liberate Study in 09/2023 with poorly controlled diabetes (A1c was 8.6)  Current diabetes medications include: metformin  500 mg daily, Jardiance  (empagliflozin ) 25 mg daily, Ozempic  (semaglutide ) 1 mg weekly Current hypertension medications include: chlorthalidone  25 mg, losartan  100 mg Current hyperlipidemia medications include: atorvastatin  40 mg  Patient reports adherence to taking all medications as prescribed.   Do you feel that your medications are working for you? yes Have you been experiencing any side effects to the medications prescribed? no Insurance coverage: [employer]  Patient reports hypoglycemic events. She states that she is occasionally alerted of low blood glucose on her CGM but denies any symptoms of hypoglycemia.   Patient reported dietary habits: Eats 2-3 meals/day - eating less since you started smoking Breakfast: sausage, grits, cheese Dinner: meat (hamburger helper, chicken and dumplings) Snacks: crackers and peanut butter, fruit, apple sauce  Patient-reported exercise habits: Has been walking less due to increased pain in her hip and lower back  O:   Review of Systems  HENT:         Tongue - complaint of burning.   All other systems reviewed and are negative.   Physical Exam Constitutional:       Appearance: Normal appearance.  Neurological:     Mental Status: She is alert.  Psychiatric:        Mood and Affect: Mood normal.        Behavior: Behavior normal.    Libre3 CGM Download today % Time CGM is active: 94% Average Glucose: 132 mg/dL Glucose Management Indicator: 6.5%  Glucose Variability: 21.6% (goal <36%) Time in Goal:  - Time in range 70-180: 94% - Time above range: 6% - Time below range: 0%  Lab Results  Component Value Date   HGBA1C 6.9 11/22/2023   Vitals:   01/20/24 0950 01/20/24 0954  BP: (!) 156/83 (!) 159/78  Pulse: 94   SpO2: 100%     Lipid Panel     Component Value Date/Time   CHOL 112 11/26/2021 1054   TRIG 98 11/26/2021 1054   HDL 54 11/26/2021 1054   CHOLHDL 2.1 11/26/2021 1054   LDLCALC 40 11/26/2021 1054    Clinical Atherosclerotic Cardiovascular Disease (ASCVD): Yes  The ASCVD Risk score (Arnett DK, et al., 2019) failed to calculate for the following reasons:   The valid total cholesterol range is 130 to 320 mg/dL   A/P: LIBERATE Study: - 2 additional sensors provided to the patient, 1 was applied in clinic. Educated to contact the office if the sensor falls off early and replacements are needed for their next study appointment.  Diabetes longstanding currently well-controlled, A1c 6.9. Patient is able to verbalize appropriate hypoglycemia management plan. Medication adherence appears good.  -Continued GLP-1 Ozempic  (semaglutide ) 1 mg weekly on Mondays.  -Continued SGLT2-I Jardiance  (empagliflozin ) 25 mg. Counseled on  sick day rules. -Continued metformin  500 mg daily with breakfast.  -Patient educated on purpose, proper use, and potential adverse effects. -Extensively discussed pathophysiology of diabetes, recommended lifestyle interventions, dietary effects on blood sugar control.  -Counseled on s/sx of and management of hypoglycemia.  -Next A1c anticipated June 2025.   ASCVD risk - Last LDL is 40 mg/dL (8/41/3244), at goal of <70  mg/dL.  - high intensity statin indicated.  -Continued atorvastatin  40 mg.   Hypertension longstanding currently elevated. Blood pressure goal of <130/80  mmHg. Medication adherence good. Blood pressure control is suboptimal due to stress about ex-husbands hospital admission. Patient also reported increased hip pain and was recently prescribed ibuprofen  600 mg daily. Noted that patient also has been experiencing increased hair growth around her face. -Start spironolactone 25 mg daily -Continued chlorthalidone  25 mg -Continued losartan  100 mg -Recheck electrolytes in 2 weeks  History of Tobacco Use Disorder - reported complete abstinence for 3 weeks.  Reports bupropion  has helped with cravings.   -Encouraged continued abstinence.  - Continue bupropion  at this time.  Consider taper off in 2-3 weeks  Sensation of Burning Tongue - X 1 month  Discussed with attending - Dr. Bevin Bucks - provided patient reassurance  Deferred further discussion/work-up to PCP, Dr. Dameron at upcoming visit in ~ 2 weeks  Written patient instructions provided. Patient verbalized understanding of treatment plan.  Total time in face to face counseling 43 minutes.    Follow-up:  Pharmacist 02/03/24 PCP clinic visit in 02/03/24 Patient seen with Volney Grumbles, PharmD, PGY-1 pharmacy resident.

## 2024-01-20 NOTE — Assessment & Plan Note (Signed)
 History of Tobacco Use Disorder - reported complete abstinence for 3 weeks.  Reports bupropion  has helped with cravings.   -Encouraged continued abstinence.  - Continue bupropion  at this time.  Consider taper off in 2-3 weeks

## 2024-01-20 NOTE — Assessment & Plan Note (Signed)
 Hypertension longstanding currently elevated. Blood pressure goal of <130/80  mmHg. Medication adherence good. Blood pressure control is suboptimal due to stress about ex-husbands hospital admission. Patient also reported increased hip pain and was recently prescribed ibuprofen  600 mg daily. Noted that patient also has been experiencing increased hair growth around her face. -Start spironolactone 25 mg daily -Continued chlorthalidone  25 mg -Continued losartan  100 mg -Recheck electrolytes in 2 weeks

## 2024-01-23 ENCOUNTER — Other Ambulatory Visit: Payer: Self-pay | Admitting: Primary Care

## 2024-01-26 NOTE — Progress Notes (Signed)
 Reviewed and agree with Dr Macky Lower plan.

## 2024-02-01 ENCOUNTER — Other Ambulatory Visit: Payer: Self-pay | Admitting: Student

## 2024-02-02 ENCOUNTER — Encounter: Payer: Self-pay | Admitting: *Deleted

## 2024-02-03 ENCOUNTER — Encounter: Payer: Self-pay | Admitting: Student

## 2024-02-03 ENCOUNTER — Telehealth: Payer: Self-pay

## 2024-02-03 ENCOUNTER — Ambulatory Visit (INDEPENDENT_AMBULATORY_CARE_PROVIDER_SITE_OTHER): Admitting: Student

## 2024-02-03 ENCOUNTER — Ambulatory Visit (INDEPENDENT_AMBULATORY_CARE_PROVIDER_SITE_OTHER): Admitting: Pharmacist

## 2024-02-03 ENCOUNTER — Encounter: Payer: Self-pay | Admitting: Pharmacist

## 2024-02-03 VITALS — BP 142/115 | HR 84 | Ht 60.0 in | Wt 188.0 lb

## 2024-02-03 DIAGNOSIS — M7989 Other specified soft tissue disorders: Secondary | ICD-10-CM | POA: Diagnosis not present

## 2024-02-03 DIAGNOSIS — K146 Glossodynia: Secondary | ICD-10-CM | POA: Diagnosis not present

## 2024-02-03 DIAGNOSIS — I129 Hypertensive chronic kidney disease with stage 1 through stage 4 chronic kidney disease, or unspecified chronic kidney disease: Secondary | ICD-10-CM

## 2024-02-03 DIAGNOSIS — F17200 Nicotine dependence, unspecified, uncomplicated: Secondary | ICD-10-CM | POA: Diagnosis not present

## 2024-02-03 DIAGNOSIS — I10 Essential (primary) hypertension: Secondary | ICD-10-CM | POA: Diagnosis not present

## 2024-02-03 DIAGNOSIS — E1122 Type 2 diabetes mellitus with diabetic chronic kidney disease: Secondary | ICD-10-CM | POA: Diagnosis not present

## 2024-02-03 DIAGNOSIS — N183 Chronic kidney disease, stage 3 unspecified: Secondary | ICD-10-CM

## 2024-02-03 MED ORDER — XYLIMELTS 500 MG MT DISK: 500.0000 mg | DISK | Freq: Three times a day (TID) | OROMUCOSAL | 0 refills | Status: DC | PRN

## 2024-02-03 MED ORDER — BIOTENE DRY MOUTH MT LIQD: 15.0000 mL | OROMUCOSAL | 0 refills | Status: DC | PRN

## 2024-02-03 NOTE — Assessment & Plan Note (Signed)
 Diabetes longstanding currently well-controlled, recent A1c 6.9 and GMI of 6.5 today. Patient is able to verbalize appropriate hypoglycemia management plan. Medication adherence appears good. Patient continues to have issues with CGM sensors staying in place.  Following discussion, we agreed to try use of upper left chest today to apply sensor.  New sensor applied and 3 additional sensors provided to completed CGM study -Continued GLP-1 Ozempic  (semaglutide ) 1 mg weekly on Mondays.  -Continued SGLT2-I Jardiance  (empagliflozin ) 25 mg. Counseled on sick day rules. -Continued metformin  500 mg daily with breakfast.  -Patient educated on purpose, proper use, and potential adverse effects. -Extensively discussed pathophysiology of diabetes, recommended lifestyle interventions, dietary effects on blood sugar control.

## 2024-02-03 NOTE — Assessment & Plan Note (Signed)
 Tobacco use disorder history.  Now reports complete abstinence for > 1 month. - Continues on bupropion  150mg  twice daily.  Discussed continuation of current doses and frequency at this time.  We also discussed possibility of tapering to once daily in the AM in 2 months (at 6 month Liberate study visit) if patient remains abstinent from tobacco use.

## 2024-02-03 NOTE — Assessment & Plan Note (Signed)
 Hypertension longstanding and currently with improved control following addition of spironolactone . Blood pressure goal of <130/80 mmHg. Medication adherence fair with patient reporting missing spironolactone  occasionally.  -Continue current regimen of Losartan  100mg  daily, chlorthalidone  25mg  daily and spironolactone  25mg  daily.

## 2024-02-03 NOTE — Progress Notes (Signed)
 Reviewed and agree with Dr Macky Lower plan.

## 2024-02-03 NOTE — Progress Notes (Signed)
    S:     Chief Complaint  Patient presents with   Medication Management    Diabetes - Tobacco    68 y.o. female who presents for diabetes evaluation, education, and management. Patient arrives in [facility], and she reports that continued abstinence from smoking (now ~ 6 weeks).  She reports increased stress due to recent hospitalization of her ex-husband who is now out of the hospital, She also reports stress related to grief related to anniversaries of death (mother, daughter). She presents with the assistance of a cane.    Patient was also seen by Primary Care Provider, Dr. Thomas Fleischer, today.   Current diabetes medications include: metformin  500 mg daily, Jardiance  (empagliflozin ) 25 mg daily, Ozempic  (semaglutide ) 1 mg weekly Current hypertension medications include: chlorthalidone  25 mg, losartan  100 mg Current hyperlipidemia medications include: atorvastatin  40 mg   Patient denies hypoglycemic events.  Challenge of keeping the CGM sensors working and in place was majority of visit.     O:   Review of Systems  Musculoskeletal:  Positive for back pain and joint pain.  Psychiatric/Behavioral:  Positive for depression.     Physical Exam Vitals reviewed.  Constitutional:      Appearance: Normal appearance.  Pulmonary:     Effort: Pulmonary effort is normal.  Neurological:     Mental Status: She is alert.  Psychiatric:        Thought Content: Thought content normal.    Lab Results  Component Value Date   HGBA1C 6.9 11/22/2023   Lipid Panel     Component Value Date/Time   CHOL 112 11/26/2021 1054   TRIG 98 11/26/2021 1054   HDL 54 11/26/2021 1054   CHOLHDL 2.1 11/26/2021 1054   LDLCALC 40 11/26/2021 1054   Clinical Atherosclerotic Cardiovascular Disease (ASCVD): Yes    A/P: Diabetes longstanding currently well-controlled, recent A1c 6.9 and GMI of 6.5 today. Patient is able to verbalize appropriate hypoglycemia management plan. Medication adherence appears  good. Patient continues to have issues with CGM sensors staying in place.  Following discussion, we agreed to try use of upper left chest today to apply sensor.  New sensor applied and 3 additional sensors provided to completed CGM study -Continued GLP-1 Ozempic  (semaglutide ) 1 mg weekly on Mondays.  -Continued SGLT2-I Jardiance  (empagliflozin ) 25 mg. Counseled on sick day rules. -Continued metformin  500 mg daily with breakfast.  -Patient educated on purpose, proper use, and potential adverse effects. -Extensively discussed pathophysiology of diabetes, recommended lifestyle interventions, dietary effects on blood sugar control.  -Counseled on s/sx of and management of hypoglycemia.  Tobacco use disorder history.  Now reports complete abstinence for > 1 month. - Continues on bupropion  150mg  twice daily.  Discussed continuation of current doses and frequency at this time.  We also discussed possibility of tapering to once daily in the AM in 2 months (at 6 month Liberate study visit) if patient remains abstinent from tobacco use.   Hypertension longstanding and currently with improved control following addition of spironolactone . Blood pressure goal of <130/80 mmHg. Medication adherence fair with patient reporting missing spironolactone  occasionally.  -Continue current regimen of Losartan  100mg  daily, chlorthalidone  25mg  daily and spironolactone  25mg  daily.   Written patient instructions provided. Patient verbalized understanding of treatment plan.  Total time in face to face counseling 22 minutes.    Follow-up:  Pharmacist July 2025 PCP clinic visit in as scheduled

## 2024-02-03 NOTE — Assessment & Plan Note (Addendum)
 Right calf with palpable localized swelling Given risk factors, obtain DVT US  today  I question if this is simply venous insufficiency or soft tissue growth such as lipoma but do not want to miss DVT in this immobile patient with hx tobacco use

## 2024-02-03 NOTE — Progress Notes (Signed)
    SUBJECTIVE:   CHIEF COMPLAINT / HPI:   Firm leg swelling Noticed swelling  on right calf last weekend Tender to the touch No erythema No shortness of breath  Burning tongue sensation Drinks 3x per week (usually will have 1 pint) Burns on her tongue and on the sides Knows it is not thrush because her tongue is not white Hurts when she drinks alcohol, sugar-free lemonade  PERTINENT  PMH / PSH:  Alcohol use disorder Prior tobacco user  OBJECTIVE:   BP (!) 142/115   Pulse 84   Ht 5' (1.524 m)   Wt 188 lb (85.3 kg)   SpO2 100%   BMI 36.72 kg/m   General: NAD, chronically ill appearing HEENT: Edentulous upper mouth. Slightly geographic tongue with no evidence of mucosal ulceration/thrush. Dry mucosa. Cardiac: RRR Neuro: A&O Respiratory: normal WOB on RA. No wheezing or crackles on auscultation, good lung sounds throughout Extremities: +swelling right calf  ASSESSMENT/PLAN:   Glossodynia No abnormalities seen on HEENT exam other than xerostomia. Diff dx:  Considered dry mouth, thrush, a viral outbreak such as herpes, recurrent canker sores, and inflammatory conditions that cause visible changes to the mouth lining.  No new oral care products, recent dental work, or specific food triggers like fresh/raw fruits - Avoid acidic foods, reduce alcohol intake - Start Biotene/Xylimelts for xerostomia - Monitor for mucosal changes - Regular dental follow up  Leg swelling Right calf with palpable localized swelling Given risk factors, obtain DVT US  today  I question if this is simply venous insufficiency or soft tissue growth such as lipoma but do not want to miss DVT in this immobile patient with hx tobacco use   Kendell Gammon, DO [facility]

## 2024-02-03 NOTE — Patient Instructions (Addendum)
 It was great seeing you today.  As we discussed, - Xylimelts and Biotene (good for dry mouth) - Ultrasound of your leg   If you have any questions or concerns, please feel free to call the clinic.   Have a wonderful day,  Dr. Vallorie Gayer [facility] [phone]

## 2024-02-03 NOTE — Assessment & Plan Note (Signed)
>>  ASSESSMENT AND PLAN FOR TOBACCO DEPENDENCE WRITTEN ON 02/03/2024  1:17 PM BY KOVAL, PETER G, RPH-CPP  Tobacco use disorder history.  Now reports complete abstinence for > 1 month. - Continues on bupropion  150mg  twice daily.  Discussed continuation of current doses and frequency at this time.  We also discussed possibility of tapering to once daily in the AM in 2 months (at 6 month Liberate study visit) if patient remains abstinent from tobacco use.

## 2024-02-03 NOTE — Telephone Encounter (Signed)
 Called [facility] and spoke with Vallarie Gauze to schedule a Vas. US  appointment for patient while patient was in office.   Was able to schedule appointment for 5/23 at 2:30pm at [address] [facility]. Provided patient with the information that was given to me: Vallarie Gauze informed me to let patient know  To use the valet service To skip the lobby and to take elevator to the 4th floor. To also go to the 1st check-in desk.  Patient was appreciative.  Christ Courier, CMA

## 2024-02-03 NOTE — Assessment & Plan Note (Signed)
 No abnormalities seen on HEENT exam other than xerostomia. Diff dx:  Considered dry mouth, thrush, a viral outbreak such as herpes, recurrent canker sores, and inflammatory conditions that cause visible changes to the mouth lining.  No new oral care products, recent dental work, or specific food triggers like fresh/raw fruits - Avoid acidic foods, reduce alcohol intake - Start Biotene/Xylimelts for xerostomia - Monitor for mucosal changes - Regular dental follow up

## 2024-02-03 NOTE — Patient Instructions (Signed)
 It was nice to see you today!  Keep up the great work on your glucose control AND stay quit from smoking.   Medication Changes: Continue all other medication the same.   Keep up the good work with diet and exercise. Aim for a diet full of vegetables, fruit and lean meats (chicken, [place], fish). Try to limit salt intake by eating fresh or frozen vegetables (instead of canned), rinse canned vegetables prior to cooking and do not add any additional salt to meals.   Plan to come back to see me in July - earlier if needed.

## 2024-02-04 ENCOUNTER — Ambulatory Visit (HOSPITAL_COMMUNITY)
Admission: RE | Admit: 2024-02-04 | Discharge: 2024-02-04 | Disposition: A | Discharge status: Home / Self Care | Source: Ambulatory Visit | Attending: Family Medicine | Admitting: Family Medicine

## 2024-02-04 ENCOUNTER — Ambulatory Visit: Payer: Self-pay | Admitting: Student

## 2024-02-04 DIAGNOSIS — M7989 Other specified soft tissue disorders: Secondary | ICD-10-CM | POA: Insufficient documentation

## 2024-02-18 ENCOUNTER — Other Ambulatory Visit: Payer: Self-pay | Admitting: Primary Care

## 2024-02-22 ENCOUNTER — Encounter: Payer: Self-pay | Admitting: *Deleted

## 2024-02-23 ENCOUNTER — Other Ambulatory Visit: Payer: Self-pay | Admitting: Student

## 2024-02-25 ENCOUNTER — Other Ambulatory Visit: Payer: Self-pay | Admitting: Physician Assistant

## 2024-02-25 ENCOUNTER — Other Ambulatory Visit: Payer: Self-pay | Admitting: Student

## 2024-02-25 ENCOUNTER — Telehealth: Payer: Self-pay

## 2024-02-25 NOTE — Telephone Encounter (Signed)
 If needs to continue, would recommend getting from pcp due to kidney function

## 2024-02-25 NOTE — Telephone Encounter (Signed)
 Called and let patient know. She will call her PCP.

## 2024-02-28 NOTE — Telephone Encounter (Signed)
 Patient calls nurse line again in regards to Ibuprofen  refill.   Patient advised PCP declined the refill.   She reports she needs this medication for the pain in her legs.   She reports Ortho has deferred to us  for refills.   Advised will send back to PCP.

## 2024-02-29 ENCOUNTER — Other Ambulatory Visit: Payer: Self-pay | Admitting: Physician Assistant

## 2024-02-29 NOTE — Telephone Encounter (Signed)
 Called patient per Dr. Gigi Kyle request to inform her that she advise patient to take tylenol  instead of ibuprofen  due to having chronic kidney disease and hypertension.  Patient stated that someone have already told her the information and that it doesn't help with her pain but she guess she will use tylenol  when she need it.  Christ Courier, CMA

## 2024-02-29 NOTE — Telephone Encounter (Signed)
 I am happy to fill as long as ok from pcp.  I had patient reach out to pcp for refill due to renal function based on most recent labs.

## 2024-03-01 DIAGNOSIS — H5213 Myopia, bilateral: Secondary | ICD-10-CM | POA: Diagnosis not present

## 2024-03-01 DIAGNOSIS — H524 Presbyopia: Secondary | ICD-10-CM | POA: Diagnosis not present

## 2024-03-01 DIAGNOSIS — E119 Type 2 diabetes mellitus without complications: Secondary | ICD-10-CM | POA: Diagnosis not present

## 2024-03-09 ENCOUNTER — Encounter: Payer: Self-pay | Admitting: Student

## 2024-03-09 ENCOUNTER — Ambulatory Visit (INDEPENDENT_AMBULATORY_CARE_PROVIDER_SITE_OTHER): Admitting: Student

## 2024-03-09 VITALS — BP 137/84 | HR 84 | Wt 192.2 lb

## 2024-03-09 DIAGNOSIS — L700 Acne vulgaris: Secondary | ICD-10-CM | POA: Diagnosis not present

## 2024-03-09 DIAGNOSIS — N183 Chronic kidney disease, stage 3 unspecified: Secondary | ICD-10-CM | POA: Diagnosis not present

## 2024-03-09 DIAGNOSIS — E1122 Type 2 diabetes mellitus with diabetic chronic kidney disease: Secondary | ICD-10-CM | POA: Diagnosis not present

## 2024-03-09 DIAGNOSIS — I129 Hypertensive chronic kidney disease with stage 1 through stage 4 chronic kidney disease, or unspecified chronic kidney disease: Secondary | ICD-10-CM

## 2024-03-09 LAB — POCT GLYCOSYLATED HEMOGLOBIN (HGB A1C): HbA1c, POC (controlled diabetic range): 6.9 % (ref 0.0–7.0)

## 2024-03-09 MED ORDER — CLINDAMYCIN PHOS-BENZOYL PEROX 1-5 % EX GEL: Freq: Two times a day (BID) | CUTANEOUS | 0 refills | Status: DC

## 2024-03-09 MED ORDER — OZEMPIC (2 MG/DOSE) 8 MG/3ML ~~LOC~~ SOPN: 2.0000 mg | PEN_INJECTOR | SUBCUTANEOUS | 0 refills | Status: DC

## 2024-03-09 NOTE — Assessment & Plan Note (Signed)
 A1c today -Increase Ozempic  to 2 mg weekly -Continue Jardiance  10 mg daily -Continue Metformin  500 mg BID

## 2024-03-09 NOTE — Progress Notes (Signed)
 S:    Joined visit with PCP, Dr. Dameron for end of study visit.   68 y.o. female who presents for diabetes evaluation, education, and management in the context of the LIBERATE Study.  She reports she is OUT of sensors.   PMH is significant for smoking - reports quit smoking for > 6 months!   Current diabetes medications include: Jardiance  (empagliflozin ) 25mg , Metformin  500mg  Daily, Ozempic  2mg  weekly.   Patient denies hypoglycemic events.   O:   Libre3 CGM Download 02/25/24 (end of sensor supply) % Time CGM is active: 94% Average Glucose: 138 mg/dL Glucose Management Indicator: 6.6  Glucose Variability: 20% (goal <36%) Time in Goal:  - Time in range 70-180: 93% - Time above range: 7% with 0% > 250 - Time below range: 0% Observed patterns: overall low variability and good control  Lab Results  Component Value Date   HGBA1C 6.9 03/09/2024     Vitals:   03/09/24 1456  BP: 137/84  Pulse: 84  SpO2: 100%     Lipid Panel     Component Value Date/Time   CHOL 112 11/26/2021 1054   TRIG 98 11/26/2021 1054   HDL 54 11/26/2021 1054   CHOLHDL 2.1 11/26/2021 1054   LDLCALC 40 11/26/2021 1054    Clinical Atherosclerotic Cardiovascular Disease (ASCVD): Yes  Patient is participating in a Managed Medicaid Plan:  Yes     A/P:  LIBERATE Study:  - Discussed options for obtaining continued supply of Libre 3 sensors.  Patient unlikely to use sensors long-term but did report that sensors improved control and understanding of her disease.   Diabetes longstanding currently at goal with A1C today 6.9 and no low glucose events. Medication adherence appears good. - Continue all medications the same - no suggestions for change.  - Jardiance  (empagliflozin ) 25mg , Metformin  500mg  Daily, Ozempic  2mg  weekly.   History of Tobacco Use Disorder - Quit X 6  months! -Continued to encourage long-term cessation.  Congratulated on long-term success.  Written patient instructions provided.  Patient verbalized understanding of treatment plan.  Total time in face to face counseling 12 minutes.    Follow-up:  Pharmacist None Planned at this time - end of Liberate study. PCP clinic visit in TBD.  Patient seen with PCP, Dr. Dameron

## 2024-03-09 NOTE — Progress Notes (Signed)
    SUBJECTIVE:   CHIEF COMPLAINT / HPI:   T2DM - Having some sensor issues (currently in the [place] study) - She has appointment witih Dr. Koval on 03/20/24 - Would like to increase her Ozempic , currently taking 1 mg weekly with no difficulty and not having any adverse effects but having weight gain  Face concern -Has had a face breakout lately with acne on her lower face -Family reunion coming up and wants to know what she needs to do   PERTINENT  PMH / PSH: Hx tobacco use (not currently smoking)  OBJECTIVE:   BP 137/84   Pulse 84   Wt 192 lb 3.2 oz (87.2 kg)   SpO2 100%   BMI 37.54 kg/m   General: Pleasant, ambulates with cane HEENT: [place] over chin and cheeks of face Neuro: A&O Respiratory: normal WOB on RA. No wheezing or crackles on auscultation, good lung sounds throughout Extremities: No swelling   ASSESSMENT/PLAN:   Type 2 DM with CKD stage 3 and hypertension (HCC) A1c today -Increase Ozempic  to 2 mg weekly -Continue Jardiance  10 mg daily -Continue Metformin  500 mg BID  Acne vulgaris Start Benzaclin (clindamycin- benzoyl peroxide) for 2 weeks Gentle cleanser, moisturizer   Up to date on Medicare Annual Wellness Visit- was completed 11/27/2023 with Roz Fuller, LPN  Barabara Dama, DO [facility]

## 2024-03-09 NOTE — Assessment & Plan Note (Signed)
 Start Benzaclin (clindamycin- benzoyl peroxide) for 2 weeks Gentle cleanser, moisturizer

## 2024-03-09 NOTE — Patient Instructions (Signed)
 It has been a pleasure knowing you and getting to care for you.  Continue doing a great job with not smoking!  Increase Ozempic  to 2 mg weekly.  I sent an acne cream to your pharmacy.  Dr. Nica Friske

## 2024-03-10 ENCOUNTER — Other Ambulatory Visit: Payer: Self-pay | Admitting: Student

## 2024-03-14 ENCOUNTER — Other Ambulatory Visit: Payer: Self-pay | Admitting: Family Medicine

## 2024-03-14 DIAGNOSIS — Z1231 Encounter for screening mammogram for malignant neoplasm of breast: Secondary | ICD-10-CM

## 2024-03-17 ENCOUNTER — Ambulatory Visit
Admission: EM | Admit: 2024-03-17 | Discharge: 2024-03-17 | Disposition: A | Discharge status: Home / Self Care | Attending: Family Medicine | Admitting: Family Medicine

## 2024-03-17 DIAGNOSIS — K111 Hypertrophy of salivary gland: Secondary | ICD-10-CM | POA: Diagnosis not present

## 2024-03-17 DIAGNOSIS — K1121 Acute sialoadenitis: Secondary | ICD-10-CM

## 2024-03-17 MED ORDER — AMOXICILLIN-POT CLAVULANATE 875-125 MG PO TABS: 1.0000 | ORAL_TABLET | Freq: Two times a day (BID) | ORAL | 0 refills | Status: AC

## 2024-03-17 NOTE — ED Triage Notes (Signed)
 This facial swelling started on the right side of my face (I was seen by my PCP), she thought it was an acne problem, the Rx she gave me did not work, now the swelling is on the left side extending down to neck. No fever. No rash. No dental problem.

## 2024-03-17 NOTE — Discharge Instructions (Signed)
 Take amoxicillin -clavulanate 875 mg--1 tab twice daily with food for 7 days,  Please get some sugar-free sour candies and suck on those.  This can help relieve any blockage in the salivary gland duct, in case that is part of what is causing your symptoms on the left side.  Do a warm compress wherever there is swelling a couple of times a day.  Please follow-up with your primary care about this issue  If the symptoms worsen instead of improving, please go to the emergency room for further evaluation

## 2024-03-17 NOTE — ED Triage Notes (Signed)
 I did call the after hours nurse yesterday who insisted I call the ambulance for possible Ludwig angina, assessment by EMS said she was ok to wait and be seen today.

## 2024-03-17 NOTE — ED Provider Notes (Signed)
 [facility]    CSN: [id number] Arrival date & time: 03/17/24  9077      History   Chief Complaint Chief Complaint  Patient presents with   Facial Swelling    HPI Alexandra Campos is a 68 y.o. female.   HPI Here for swelling and discomfort of her left face now.  On June 26 when she was seeing her primary care, she was begun on a topical therapy for possible acne.  She has begun having some bumps on her right jaw and submandibular area.  Pretty soon after that visit she started having a little swelling on her left jaw.  But in the last 2 days she has developed a lot of swelling on her left cheek and jaw and it is a little painful.  No fever or chills  No upper respiratory symptoms.  No pain inside her mouth  NKDA  She has diabetes with good control. Her kidney function has been reduced in the past, with an EGFR of 53 in December 24 Past Medical History:  Diagnosis Date   Abnormal ankle brachial index (ABI) 01/14/2020   Abnormal facial hair 11/24/2019   Arthritis    Chronic kidney disease    Complex cyst of right ovary 04/09/2020   Likely a dermoid. Normal CA 125     COPD (chronic obstructive pulmonary disease) (HCC)    Diabetes mellitus without complication (HCC)    GERD (gastroesophageal reflux disease)    Hyperlipidemia    Hypertension    Ovarian cyst 03/09/2020   CA-125 17.3     Peripheral vascular disease (HCC)    Postmenopausal bleeding 02/07/2020   Rash 04/20/2022   Right upper quadrant pain 01/21/2022   Stress incontinence of urine 10/03/2019   Substance abuse (HCC)    ETOH, THC   Wheezing 12/13/2020   Yeast infection involving the vagina and surrounding area 06/10/2020    Patient Active Problem List   Diagnosis Date Noted   Acne vulgaris 03/09/2024   Glossodynia 02/03/2024   Leg swelling 09/24/2023   Tobacco use 09/24/2023   Adnexal mass 08/31/2023   Pre-op evaluation 08/16/2023   COPD with acute exacerbation (HCC) 08/03/2023    Obstructive lung disease (HCC) 07/09/2022   Low back pain 09/22/2020   Atherosclerosis of artery of extremity with rest pain (HCC) 03/15/2020   Ovarian cyst 03/09/2020   Postmenopausal bleeding 02/07/2020   Type 2 DM with CKD stage 3 and hypertension (HCC) 07/03/2019   Class 3 severe obesity with body mass index (BMI) of 40.0 to 44.9 in adult 06/16/2019   CKD (chronic kidney disease) stage 3, GFR 30-59 ml/min (HCC) 06/16/2019   Healthcare maintenance 06/16/2019   Carpal tunnel syndrome on right 06/16/2019   Hypertension 08/04/2018   Tobacco dependence 08/04/2018    Past Surgical History:  Procedure Laterality Date   ABDOMINAL AORTOGRAM W/LOWER EXTREMITY N/A 03/15/2020   Procedure: ABDOMINAL AORTOGRAM W/LOWER EXTREMITY;  Surgeon: Eliza Lonni RAMAN, MD;  Location: [facility] INV[facility]IVE [facility];  Service: Cardiovascular;  Laterality: N/A;   ANKLE SURGERY Right    ORIF   CARPAL TUNNEL RELEASE Right 11/18/2022   Procedure: RIGHT CARPAL TUNNEL RELEASE;  Surgeon: Jerri Kay HERO, MD;  Location: [facility];  Service: Orthopedics;  Laterality: Right;   DILATATION & CURETTAGE/HYSTEROSCOPY WITH MYOSURE N/A 08/31/2023   Procedure: DILATATION & CURETTAGE/HYSTEROSCOPY WITH MYOSURE;  Surgeon: Eldonna Mays, MD;  Location: [facility];  Service: Gynecology;  Laterality: N/A;   ROBOTIC ASSISTED BILATERAL SALPINGO OOPHERECTOMY Bilateral  08/31/2023   Procedure: XI ROBOTIC ASSISTED BILATERAL SALPINGO OOPHORECTOMY;  Surgeon: Eldonna Mays, MD;  Location: [facility];  Service: Gynecology;  Laterality: Bilateral;   THROAT SURGERY     Reports polyps removed from throat, done at [facility] approx 15 years ago   TONSILLECTOMY     TUBAL LIGATION Bilateral    WISDOM TOOTH EXTRACTION      OB History     Gravida  3   Para  2   Term  2   Preterm      AB  1   Living         SAB  1   IAB      Ectopic      Multiple      Live Births  2            Home Medications    Prior to Admission  medications   Medication Sig Start Date End Date Taking? Authorizing Provider  acetaminophen  (TYLENOL ) 650 MG CR tablet Take 650 mg by mouth every 8 (eight) hours as needed for pain.   Yes [provider]  amoxicillin -clavulanate (AUGMENTIN ) 875-125 MG tablet Take 1 tablet by mouth 2 (two) times daily for 7 days. 03/17/24 03/24/24 Yes Vonna Sharlet POUR, MD  antiseptic oral rinse (BIOTENE) LIQD 15 mLs by Mouth Rinse route as needed for dry mouth. 02/03/24  Yes Dameron, Marisa, DO  aspirin EC 81 MG tablet Take 81 mg by mouth daily. Swallow whole.   Yes [provider]  atorvastatin  (LIPITOR) 40 MG tablet Take 1 tablet by mouth once daily 12/13/23  Yes Dameron, Marisa, DO  buPROPion  (WELLBUTRIN  SR) 150 MG 12 hr tablet Take 1 tablet by mouth twice daily 02/20/24  Yes Hope Almarie ORN, NP  cetirizine  (ZYRTEC ) 10 MG tablet Take 1 tablet (10 mg total) by mouth daily. 12/06/21  Yes Raspet, Erin K, PA-C  chlorthalidone  (HYGROTON ) 25 MG tablet Take 1 tablet (25 mg total) by mouth daily. 01/13/23  Yes Dameron, Marisa, DO  clindamycin -benzoyl peroxide (BENZACLIN) gel Apply topically 2 (two) times daily. 03/09/24  Yes Dameron, Marisa, DO  empagliflozin  (JARDIANCE ) 25 MG TABS tablet Take 1 tablet (25 mg total) by mouth daily. 03/04/23  Yes Dameron, Marisa, DO  fluticasone -salmeterol (ADVAIR) 250-50 MCG/ACT AEPB INHALE 1 PUFF IN THE MORNING AND AT BEDTIME 12/23/23  Yes Kara Dorn NOVAK, MD  losartan  (COZAAR ) 100 MG tablet Take 1 tablet (100 mg total) by mouth at bedtime. 12/29/23  Yes Dameron, Marisa, DO  metFORMIN  (GLUCOPHAGE -XR) 500 MG 24 hr tablet Take 1 tablet (500 mg total) by mouth daily with breakfast. 12/29/23  Yes Dameron, Marisa, DO  Multiple Vitamin (MULTIVITAMIN PO) Take 1 tablet by mouth daily.   Yes [provider]  nystatin  cream (MYCOSTATIN ) APPLY  CREAM TOPICALLY TWICE DAILY 03/12/24  Yes Dameron, Marisa, DO  omeprazole  (PRILOSEC) 20 MG capsule Take 1 capsule (20 mg total) by mouth  daily. 12/29/23  Yes Dameron, Marisa, DO  Semaglutide , 2 MG/DOSE, (OZEMPIC , 2 MG/DOSE,) 8 MG/3ML SOPN Inject 2 mg into the skin once a week. 03/09/24  Yes Dameron, Marisa, DO  SPIRIVA  RESPIMAT 2.5 MCG/ACT AERS INHALE 2 SPRAY(S) BY MOUTH ONCE DAILY 10/25/23  Yes Dameron, Marisa, DO  spironolactone  (ALDACTONE ) 25 MG tablet TAKE 1 TABLET BY MOUTH AT BEDTIME 01/27/24  Yes Dameron, Marisa, DO  vitamin B-12 (CYANOCOBALAMIN) 100 MCG tablet Take 100 mcg by mouth daily.   Yes [provider]  albuterol  (VENTOLIN  HFA) 108 (90 Base) MCG/ACT inhaler INHALE  2 PUFFS BY MOUTH EVERY 4 HOURS AS NEEDED FOR WHEEZING FOR SHORTNESS OF BREATH 03/17/23   Kara Dorn NOVAK, MD  ibuprofen  (ADVIL ) 600 MG tablet Take 1 tablet (600 mg total) by mouth 2 (two) times daily as needed. 12/22/23   Jule Ronal CROME, PA-C  Misc. Devices (ROLLATOR ULTRA-LIGHT) MISC Prior rollator dispensed in 2023 is irretrievably broken  Currently using a cane Cannot ambulate > 300 feet without fatigue, pain, shortness of breath Has multiple chronic conditions making it difficult to ambulate without assistance (COPD and multilevel osteoarthritis) High risk for falls without assistive device 09/28/23   Dameron, Marisa, DO  tiZANidine (ZANAFLEX) 4 MG tablet Take 4 mg by mouth every 6 (six) hours as needed. 08/22/23   [provider]  Xylitol (XYLIMELTS) 500 MG DISK Use as directed 500 mg in the mouth or throat 3 (three) times daily as needed. 02/03/24   Dartha Geralds, DO    Family History Family History  Problem Relation Age of Onset   Stomach cancer Mother    Diabetes Mother    Hypertension Mother    Dementia Mother    Diabetes Father    Hypertension Father    Leukemia Father    Diabetes Sister    Diabetes Sister    Diabetes Brother    Hypertension Brother    Colon cancer Brother 50   Diabetes Brother    Diabetes Maternal Grandmother    Hypertension Maternal Grandmother    Diabetes Daughter    Hypertension Daughter     Diabetes Son    Cancer Paternal Uncle        unknown cancer   Colon polyps Neg Hx    Esophageal cancer Neg Hx    Rectal cancer Neg Hx    Breast cancer Neg Hx    Ovarian cancer Neg Hx    Endometrial cancer Neg Hx     Social History Social History   Tobacco Use   Smoking status: Former    Current packs/day: 0.00    Average packs/day: 0.5 packs/day for 53.3 years (26.6 ttl pk-yrs)    Types: Cigarettes    Start date: 37    Quit date: 12/28/2023    Years since quitting: 0.2    Passive exposure: Current   Smokeless tobacco: Never   Tobacco comments:    Patient stated that she quit smoking 10/16/2023.  Vaping Use   Vaping status: Never Used  Substance Use Topics   Alcohol use: Yes    Alcohol/week: 5.0 standard drinks of alcohol    Types: 5 Cans of beer per week    Comment: 5 drinks/week    Drug use: Yes    Types: Marijuana    Comment: last smoked cig today, last marijuana 11/16/2022     Allergies   Patient has no known allergies.   Review of Systems Review of Systems   Physical Exam Triage Vital Signs ED Triage Vitals  Encounter Vitals Group     BP 03/17/24 0933 139/85     Girls Systolic BP Percentile --      Girls Diastolic BP Percentile --      Boys Systolic BP Percentile --      Boys Diastolic BP Percentile --      Pulse Rate 03/17/24 0933 85     Resp 03/17/24 0933 20     Temp 03/17/24 0933 (!) 97.5 F (36.4 C)     Temp Source 03/17/24 0933 Oral     SpO2 03/17/24 0933 96 %  Weight 03/17/24 0929 192 lb 3.9 oz (87.2 kg)     Height 03/17/24 0929 5' (1.524 m)     Head Circumference --      Peak Flow --      Pain Score 03/17/24 0926 0     Pain Loc --      Pain Education --      Exclude from Growth Chart --    No data found.  Updated Vital Signs BP 139/85 (BP Location: Left Arm)   Pulse 85   Temp (!) 97.5 F (36.4 C) (Oral)   Resp 20   Ht 5' (1.524 m)   Wt 87.2 kg   SpO2 96%   BMI 37.54 kg/m   Visual Acuity Right Eye Distance:   Left Eye  Distance:   Bilateral Distance:    Right Eye Near:   Left Eye Near:    Bilateral Near:     Physical Exam Vitals reviewed.  Constitutional:      General: She is not in acute distress.    Appearance: She is not toxic-appearing.  HENT:     Head:     Comments: There is some mild swelling along the right jaw.  There is more pronounced swelling and some erythema of the parotid gland area of the left cheek.  It is also tender.  This extends from anterior to the left ear down along her angle of the left jaw and onto the left mid jaw.    Nose: Nose normal.     Mouth/Throat:     Mouth: Mucous membranes are moist.     Pharynx: No oropharyngeal exudate or posterior oropharyngeal erythema.  Eyes:     Extraocular Movements: Extraocular movements intact.     Conjunctiva/sclera: Conjunctivae normal.     Pupils: Pupils are equal, round, and reactive to light.  Skin:    Coloration: Skin is not jaundiced or pale.     Comments: There are some bumps, some of which are black under her right jaw in the submandibular area.  There is some mild skin induration there.  But not much erythema.  Neurological:     General: No focal deficit present.     Mental Status: She is alert and oriented to person, place, and time.  Psychiatric:        Behavior: Behavior normal.      UC Treatments / Results  Labs (all labs ordered are listed, but only abnormal results are displayed) Labs Reviewed - No data to display  EKG   Radiology No results found.  Procedures Procedures (including critical care time)  Medications Ordered in UC Medications - No data to display  Initial Impression / Assessment and Plan / UC Course  I have reviewed the triage vital signs and the nursing notes.  Pertinent labs & imaging results that were available during my care of the patient were reviewed by me and considered in my medical decision making (see chart for details).     In case this is cellulitis, Augmentin  is sent in  to treat.  I have discussed with her that the left jaw swelling seems to be more likely parotid gland swelling which could be due to a salivary gland duct stone.  She will get some sugar-free candies to see if that will help relieve some of her swelling there.  She will follow-up with her primary care  Tylenol  as needed Final Clinical Impressions(s) / UC Diagnoses   Final diagnoses:  Parotid gland enlargement  Parotitis,  acute     Discharge Instructions      Take amoxicillin -clavulanate 875 mg--1 tab twice daily with food for 7 days,  Please get some sugar-free sour candies and suck on those.  This can help relieve any blockage in the salivary gland duct, in case that is part of what is causing your symptoms on the left side.  Do a warm compress wherever there is swelling a couple of times a day.  Please follow-up with your primary care about this issue  If the symptoms worsen instead of improving, please go to the emergency room for further evaluation     ED Prescriptions     Medication Sig Dispense Auth. Provider   amoxicillin -clavulanate (AUGMENTIN ) 875-125 MG tablet Take 1 tablet by mouth 2 (two) times daily for 7 days. 14 tablet Christipher Rieger K, MD      PDMP not reviewed this encounter.   Vonna Sharlet POUR, MD 03/17/24 [phone]

## 2024-03-20 ENCOUNTER — Ambulatory Visit: Admitting: Pharmacist

## 2024-03-21 ENCOUNTER — Ambulatory Visit (INDEPENDENT_AMBULATORY_CARE_PROVIDER_SITE_OTHER): Admitting: Family Medicine

## 2024-03-21 VITALS — BP 150/82 | HR 86 | Temp 99.1°F | Ht 60.0 in | Wt 191.4 lb

## 2024-03-21 DIAGNOSIS — L700 Acne vulgaris: Secondary | ICD-10-CM | POA: Diagnosis not present

## 2024-03-21 DIAGNOSIS — K1120 Sialoadenitis, unspecified: Secondary | ICD-10-CM | POA: Diagnosis not present

## 2024-03-21 DIAGNOSIS — I10 Essential (primary) hypertension: Secondary | ICD-10-CM

## 2024-03-21 NOTE — Patient Instructions (Signed)
 It was wonderful to see you today.  Please bring ALL of your medications with you to every visit.   Today we talked about:  Face swelling - I believe this is due to a blocked duct in your salivary gland. However, it could have also been an infection that got in through the acne spot. Given that it is not improving we have ordered  STAT CT of your head and neck. Please continue taking the antibiotics and using the oral rinse. Use the ointment on the acne spots.  Please go to  the emergency department if you have any trouble swallowing, breathing, or develop fever.   Blood pressure - Your blood pressure was elevated today. Most likely due to pain, but continue taking your medications and we will check in a week as well.   Please follow up in 1 week   Thank you for choosing [facility].   Please call [phone] with any questions about today's appointment.  Please be sure to schedule follow up at the front desk before you leave today.   Areta Saliva, MD  Family Medicine

## 2024-03-21 NOTE — Assessment & Plan Note (Signed)
 Pustular acne on left cheek that is not currently draining. Has hyperpigmented scarring underneath mandible on right side. Patient is on spironolactone  that could help slightly and clindamycin  benzoyl peroxide gel.  - Continue clindamycin  benzoyl peroxide gel  - Counseled patient not to use aquafor over the pustules while draining

## 2024-03-21 NOTE — Assessment & Plan Note (Signed)
 Uncontrolled in clinic today. However, patient is in pain currently.  Follow up in one week to recheck as well as follow up problems above.

## 2024-03-21 NOTE — Progress Notes (Signed)
 SUBJECTIVE:   CHIEF COMPLAINT / HPI:   Urgent Care Follow Up  Started having swelling on the right side of face first which lasted 3 days. Then started having swelling on the left side on 7/1. Says that she had pustules over the areas that had swelling. The pustules drained pus occasionally. Went to urgent care on July 4th. Started antibiotics on 6th. Patient says swelling went down some after starting abx, but still endorses some swelling along with itching and burning superficially.  Patient mentions UC provider also thought she could have cellulitis on the left cheek due to an infected acne pustule.  UC physician had recommended sour candies for parotiditis; however, she was not able to find sugar free sour candies at the pharmacy.  She does endorse some worsening of pain after eating, but not after using nicotine  gum.  She has noticed pus drainage on the outside but no abnormal taste in her mouth or pus drainage.  Denies trouble swallowing, breathing, fevers.  Has tried clindamycin  benzoyl peroxide gel for the acne pustules but says she stopped using it due to lack of improvement.  Uses the oral rinse that was prescribed by UC.  Denies any new medications other than spironolactone  .  Denies previous dry eyes, swollen salivary glands.  Has remained abstinent from cigarettes for about 5 weeks now.    PERTINENT  PMH / PSH: T2DM, H/o tobacco use   OBJECTIVE:   BP (!) 154/90   Pulse 82   Temp 99.1 F (37.3 C) (Oral)   Ht 5' (1.524 m)   Wt 191 lb 6.4 oz (86.8 kg)   SpO2 99%   BMI 37.38 kg/m   General: well appearing, in no acute distress HEENT: approximately 8cm x 4cm area of induration on left side of face overlying maxilla and mandible. Edema evident from inside of mouth as well. No evidence of any drainage areas or stone on inside of mouth. Overlying the left sided induration of the skin is an old scar as well as 2cm area of pustular acne that is currently not draining. No  surrounding erythema. Tender to palpation. Massage did not elicit purulent drainage inside the mouth.   Right side under the mandible more medially is an approximately 4 cm area of induration with scars of old pustules and hyperpigmentation overlying the area. Induration or edema not evident on inside of mouth sublingually.   No trouble swallowing or breathing on exam. No inspiratory stridor or muffled voice. No cervical, posterior auricular or occipital lymphadenopathy.   Bilateral TM normal appearance, ear canal normal.   CV: RRR, radial pulses equal and palpable, no BLE edema    ASSESSMENT/PLAN:   Assessment & Plan Parotiditis Swelling on left side of face most likely due to parotiditis due to distribution of induration. Minimal improvement on augmentin  over the last 48 hours. Overlying acne could have been a nidus for infection. However, given past tobacco use history concern for underlying mass that could be obstructing a duct in the parotid.  Right sided induration could also be previous salivary gland blockage now improving.  Reassured patient is not febrile, no trouble swallowing, or breathing. Does not need acute or emergent care.  - Continue augmentin   - CT soft tissues head and neck for mass or drain-able abscess.  - Continue oral rinse  - Gave patient emergency precautions for fever, worsening swelling, trouble swallowing or breathing  - Follow up in one week.  Acne vulgaris Pustular acne on left cheek  that is not currently draining. Has hyperpigmented scarring underneath mandible on right side. Patient is on spironolactone  that could help slightly and clindamycin  benzoyl peroxide gel.  - Continue clindamycin  benzoyl peroxide gel  - Counseled patient not to use aquafor over the pustules while draining  Primary hypertension Uncontrolled in clinic today. However, patient is in pain currently.  Follow up in one week to recheck as well as follow up problems above.      Areta Saliva, MD [facility]

## 2024-03-23 ENCOUNTER — Ambulatory Visit (HOSPITAL_BASED_OUTPATIENT_CLINIC_OR_DEPARTMENT_OTHER)
Admission: RE | Admit: 2024-03-23 | Discharge: 2024-03-23 | Disposition: A | Discharge status: Home / Self Care | Source: Ambulatory Visit | Attending: Family Medicine | Admitting: Family Medicine

## 2024-03-23 DIAGNOSIS — M47812 Spondylosis without myelopathy or radiculopathy, cervical region: Secondary | ICD-10-CM | POA: Diagnosis not present

## 2024-03-23 DIAGNOSIS — E041 Nontoxic single thyroid nodule: Secondary | ICD-10-CM | POA: Diagnosis not present

## 2024-03-23 DIAGNOSIS — K1120 Sialoadenitis, unspecified: Secondary | ICD-10-CM | POA: Diagnosis not present

## 2024-03-23 LAB — POCT I-STAT CREATININE: Creatinine, Ser: 1.3 mg/dL — ABNORMAL HIGH (ref 0.44–1.00)

## 2024-03-23 MED ORDER — IOHEXOL 300 MG/ML  SOLN
100.0000 mL | Freq: Once | INTRAMUSCULAR | Status: AC | PRN
  Administered 2024-03-23: 75 mL via INTRAVENOUS

## 2024-03-27 ENCOUNTER — Telehealth: Payer: Self-pay

## 2024-03-27 NOTE — Telephone Encounter (Signed)
 Patient calls nurse line reporting flank pain.   She reports left sided pain started yesterday. She reports it wraps around towards her back.   She denies any dysuria, nausea, vomiting, diarrhea, fevers or chills.   She reports she has a hx of gallstones.   Patient has an apt scheduled for tomorrow.  Precautions dicussed with patient for sooner evaluation.

## 2024-03-28 ENCOUNTER — Encounter: Payer: Self-pay | Admitting: Family Medicine

## 2024-03-28 ENCOUNTER — Ambulatory Visit: Payer: Self-pay | Admitting: Family Medicine

## 2024-03-28 ENCOUNTER — Ambulatory Visit (INDEPENDENT_AMBULATORY_CARE_PROVIDER_SITE_OTHER): Admitting: Family Medicine

## 2024-03-28 ENCOUNTER — Ambulatory Visit
Admission: RE | Admit: 2024-03-28 | Discharge: 2024-03-28 | Disposition: A | Discharge status: Home / Self Care | Source: Ambulatory Visit | Attending: Family Medicine | Admitting: Family Medicine

## 2024-03-28 VITALS — BP 151/76 | HR 88 | Temp 98.4°F | Wt 190.2 lb

## 2024-03-28 DIAGNOSIS — Z1231 Encounter for screening mammogram for malignant neoplasm of breast: Secondary | ICD-10-CM

## 2024-03-28 DIAGNOSIS — K1120 Sialoadenitis, unspecified: Secondary | ICD-10-CM | POA: Diagnosis not present

## 2024-03-28 DIAGNOSIS — R52 Pain, unspecified: Secondary | ICD-10-CM | POA: Diagnosis not present

## 2024-03-28 DIAGNOSIS — I10 Essential (primary) hypertension: Secondary | ICD-10-CM | POA: Diagnosis not present

## 2024-03-28 NOTE — Progress Notes (Signed)
 SUBJECTIVE:   CHIEF COMPLAINT / HPI:   Parotiditis  Patient was seen one week ago for left sided facial swelling. She was prescribed augmentin  (7 days) and oral rinse. Patient says she feels much better and swelling has greatly improved after completing her Augmentin  course; however, she does still have some swelling that has not resolved. Denies fevers. She has been sucking on sugar free candy as she was unable to find sugar free sour candy at three different pharmacies. Patient says she has a history of dry mouth, but not dry eyes. Has some joint pain but mainly in her knees where she has arthritis. No recurrent fevers or recurrent lymphadenopathy.    Right sided pain  Patient called nurse line yesterday regarding left sided abdominal pain that wrapped around to her back. She has history of gallstones Started yesterday morning. Pain started before she ate food. Pain lasted all day. Tylenol  helped the pain a little bit for 30-45 mins but then would come back. Patient said that she was belching and passing gas more than normal.  Pain become dull last night.  No fevers, no nausea vomiting. Last bowel movement was this morning. She has daily bowel movements that pass without straining.  Denies dysuria.  Patient says it would hurt when she would move around.   Hypertension  Patient says she forgot to take her BP medication last night. She takes losartan , chlorthalidone  and spironolactone . Says she usually takes her medication as prescribed.   PERTINENT  PMH / PSH: HTN, cholelithiasis, T2DM CKD  OBJECTIVE:   BP (!) 151/76   Pulse 88   Temp 98.4 F (36.9 C)   Wt 190 lb 3.2 oz (86.3 kg)   SpO2 100%   BMI 37.15 kg/m   General: well appearing, in no acute distress HEENT: left face with moderate induration from maxilla to mandible though much improved from previous visit, no warmth, fluctuance, mildly tender to palpation, no lymphadenopathy  CV: RRR, radial pulses equal and  palpable Resp: Normal work of breathing on room air, CTAB Abd: Soft, non tender, non distended, No CVA tenderness, no suprapubic tenderness  Neuro: Alert & Oriented x 4  MSK: right oblique muscle tight with some tenderness to palpation, no overlying rash or lesions    CT Soft Tissue Neck WWO Contrast 03/23/2024 IMPRESSION: 1. Left parotiditis likely secondary to small calculus at the orifice of the parotid duct.  ASSESSMENT/PLAN:   Assessment & Plan Parotiditis Likely any infection has resolved and is improving given much improved induration. However, stone is likely still in the duct given that patient continues to have some induration. Fortunately, no masses or abscesses on CT. Low likelihood for sjogren's given one incidence of parotiditis, no other dry eye, joint swelling, fevers.  - Continue sialogogues and warm compress  - Urgent ENT referral for possible stone removal  - Follow up in one week.   Pain of right side of body Most likely that patient pulled a muscle while she was doing her chair exercises. Pain is not necessarily abdominal or flank, but in her oblique region. Given no CVA tenderness, suprapubic tenderness or dysuria unlikely pyelonephritis. Not related to meals, not abdominal, and no fevers so less likely due to cholelithiasis or cholecystitis as well. Unlikely shingles since the muscle did feel more tight and there was no overlying rash. Most likely musculoskeletal in nature. - Recommended patient use lidocaine  patch and/or heating pad to relax the muscle.  Primary hypertension BP uncontrolled today. Likely due  to lapse in adherence given patient history.  - Counseled regarding medication adherence  - Follow up in one week    Follow up in one week regarding parotiditis and other health care maintenance.   Areta Saliva, MD [facility]

## 2024-03-28 NOTE — Patient Instructions (Signed)
 It was wonderful to see you today.  Please bring ALL of your medications with you to every visit.   Today we talked about:  Left sided facial swelling - I believe the infection is mostly gone but you might still have the stone blocking the duct. I have placed an urgent Ear Nose Throat doctor referral to see if they need to remove the stone. Please continue using lemon drops, and warm compresses to help with the pain and try to draw the stone out.   For your right sided flank pain I believe you pulled a muscle. You can use over the counter lidocaine  patches to help with this, as well as heating pad, and try stretching.   Blood pressure - Your blood pressure was high today please remember to take your medication.   I would like you to make a follow up in 1 week to follow up health care maintenance items, your blood pressure, and this swelling.   Thank you for choosing [facility].   Please call [phone] with any questions about today's appointment.  Please be sure to schedule follow up at the front desk before you leave today.   Areta Saliva, MD  Family Medicine

## 2024-03-28 NOTE — Assessment & Plan Note (Signed)
 BP uncontrolled today. Likely due to lapse in adherence given patient history.  - Counseled regarding medication adherence  - Follow up in one week

## 2024-03-30 ENCOUNTER — Other Ambulatory Visit: Payer: Self-pay

## 2024-03-30 ENCOUNTER — Telehealth: Payer: Self-pay

## 2024-03-30 MED ORDER — EMPAGLIFLOZIN 25 MG PO TABS: 25.0000 mg | ORAL_TABLET | Freq: Every day | ORAL | 3 refills | Status: AC

## 2024-03-30 NOTE — Telephone Encounter (Signed)
 Patient calls nurse line in regards to ENT referral.   She reports she was told this referral was supposed to be expedited.  Advised will forward to referral coordinator.

## 2024-03-30 NOTE — Telephone Encounter (Signed)
 Patient contacted.   ENT contact information given to patient.

## 2024-04-04 ENCOUNTER — Ambulatory Visit (INDEPENDENT_AMBULATORY_CARE_PROVIDER_SITE_OTHER): Admitting: Family Medicine

## 2024-04-04 VITALS — BP 143/77 | HR 88 | Ht 60.0 in | Wt 190.8 lb

## 2024-04-04 DIAGNOSIS — K1120 Sialoadenitis, unspecified: Secondary | ICD-10-CM | POA: Diagnosis not present

## 2024-04-04 DIAGNOSIS — E1122 Type 2 diabetes mellitus with diabetic chronic kidney disease: Secondary | ICD-10-CM | POA: Diagnosis not present

## 2024-04-04 DIAGNOSIS — E1142 Type 2 diabetes mellitus with diabetic polyneuropathy: Secondary | ICD-10-CM | POA: Diagnosis not present

## 2024-04-04 DIAGNOSIS — N183 Chronic kidney disease, stage 3 unspecified: Secondary | ICD-10-CM

## 2024-04-04 DIAGNOSIS — I129 Hypertensive chronic kidney disease with stage 1 through stage 4 chronic kidney disease, or unspecified chronic kidney disease: Secondary | ICD-10-CM

## 2024-04-04 MED ORDER — PREGABALIN 25 MG PO CAPS: 25.0000 mg | ORAL_CAPSULE | Freq: Two times a day (BID) | ORAL | 1 refills | Status: AC

## 2024-04-04 NOTE — Patient Instructions (Addendum)
 It was so good to see you today! Thank you for allowing me to take care of you.  Today we discussed the following concerns and plans:  Parotiditis - it looks like you are getting much better! - keep your appointment with ENT later this week.  Blood pressure - keep taking your medications as prescribed - we will recheck your pressure again at your next appointment  Neuropathy - start taking pregabalin  (Lyrica ) 25 mg 2 times a day - if needed, we can increase this dose  I recommend getting a Shingles vaccine - you can get this at your pharmacy.  [facility] [phone]   If you have any concerns, please call the clinic or schedule an appointment.  It was a pleasure to take care of you today. Be well!  Lauraine Norse, DO [facility], PGY-2  Do you need your medications delivered to your home?   We'll send your prescription to the [facility] for delivery.          Address: [address] [place], [place], [place] [address]          Phone: [phone]  Please call the [facility] to speak with a pharmacist and set up your home medication delivery. If you have any questions, feel free to contact us  -- we're happy to help!  Other [facility] that offer affordable prices on both prescriptions and over-the-counter items, as well as convenient services like vaccinations, are  [facility], at [facility]         Address:  [address], [place], [place] [address]         Phone: [phone]  [facility], located in the [facility]        Address: [address], [place], [place] [address]        Phone: [phone]  [facility], at [facility]       Address: [address], [place], [place] [address]       Phone: [phone]  [facility], at [facility]       Address: [address], [address], [place], [place] [address]       Phone: [phone]

## 2024-04-04 NOTE — Assessment & Plan Note (Signed)
 Pressure mildly elevated today, but she did not take AM meds. Home numbers are appropriate. - continue Losartan  100 mg daily, spironolactone  25 mg daily, chlorthalidone  25 mg daily. - follow up in 6 weeks to recheck BP

## 2024-04-04 NOTE — Progress Notes (Signed)
    SUBJECTIVE:   CHIEF COMPLAINT / HPI:   F/u parotiditis Mostly went down but still has hard area on left side of face. Not painful any more. No difficulty eating/chewing. Sees ENT later this week.  HTN Prescribed Losartan  100 mg daily, spironolactone  25 mg daily, chlorthalidone  25 mg daily. Did not take meds this AM. Checks at home sporadically - usually 130s/60s. No HA, vision changes, nausea/vomiting.  Diabetic neuropathy Numbness, tingling in both legs. Has tried gabapentin  before without help. She thinks this is also related to plaque in her legs from smoking. She has seen Vascular before and they told her there was nothing they could do it she did not stop smoking; she did quit ~4 months ago. She would like to go back to Vascular to discuss other options to help her feel better, but in the meantime asks if there is any medicine other than gabapentin  she can try.  PERTINENT  PMH / PSH: Reviewed. HTN T2DM COPD  OBJECTIVE:   BP (!) 143/77   Pulse 88   Ht 5' (1.524 m)   Wt 190 lb 12.8 oz (86.5 kg)   SpO2 100%   BMI 37.26 kg/m   General: well-appearing, no acute distress. HEENT: normocephalic, PERRLA, EOM grossly intact, MMM. Cardio: Regular rate, regular rhythm, no murmurs on exam. Pulm: Clear, no wheezing, no crackles. No increased work of breathing. Extremities: no peripheral edema. Moves all extremities equally. Neuro: Alert and oriented x3, speech normal in content, no facial asymmetry. Psych:  Cognition and judgment appear intact. Alert, communicative, and cooperative.   ASSESSMENT/PLAN:   Assessment & Plan Type 2 DM with CKD stage 3 and hypertension (HCC) Pressure mildly elevated today, but she did not take AM meds. Home numbers are appropriate. - continue Losartan  100 mg daily, spironolactone  25 mg daily, chlorthalidone  25 mg daily. - follow up in 6 weeks to recheck BP Diabetic polyneuropathy associated with type 2 diabetes mellitus (HCC) Possibly  complicated by PAD, for which she has seen Vascular previously. - start pregabalin  25 mg BID; may consider increase at future appt - she will call Vascular back to make an appt to discuss other management options now that she has quit smoking. - follow up with me in 6 weeks to assess for dosage adjustment Parotiditis Seems to be recovering well. She will keep her appointment with ENT later this week. - return precautions provided    Lauraine Norse, DO [facility]

## 2024-04-06 ENCOUNTER — Institutional Professional Consult (permissible substitution) (INDEPENDENT_AMBULATORY_CARE_PROVIDER_SITE_OTHER): Admitting: Otolaryngology

## 2024-04-06 NOTE — Progress Notes (Deleted)
 ENT CONSULT:  Reason for Consult: parotitis    HPI: Alexandra Campos is an 68 y.o. female with hx   Records Reviewed:  ***    Past Medical History:  Diagnosis Date   Abnormal ankle brachial index (ABI) 01/14/2020   Abnormal facial hair 11/24/2019   Arthritis    Chronic kidney disease    Complex cyst of right ovary 04/09/2020   Likely a dermoid. Normal CA 125     COPD (chronic obstructive pulmonary disease) (HCC)    Diabetes mellitus without complication (HCC)    GERD (gastroesophageal reflux disease)    Hyperlipidemia    Hypertension    Ovarian cyst 03/09/2020   CA-125 17.3     Peripheral vascular disease (HCC)    Postmenopausal bleeding 02/07/2020   Rash 04/20/2022   Right upper quadrant pain 01/21/2022   Stress incontinence of urine 10/03/2019   Substance abuse (HCC)    ETOH, THC   Wheezing 12/13/2020   Yeast infection involving the vagina and surrounding area 06/10/2020    Past Surgical History:  Procedure Laterality Date   ABDOMINAL AORTOGRAM W/LOWER EXTREMITY N/A 03/15/2020   Procedure: ABDOMINAL AORTOGRAM W/LOWER EXTREMITY;  Surgeon: Eliza Lonni RAMAN, MD;  Location: [facility] INVASIVE CV LAB;  Service: Cardiovascular;  Laterality: N/A;   ANKLE SURGERY Right    ORIF   CARPAL TUNNEL RELEASE Right 11/18/2022   Procedure: RIGHT CARPAL TUNNEL RELEASE;  Surgeon: Jerri Kay HERO, MD;  Location: [facility];  Service: Orthopedics;  Laterality: Right;   DILATATION & CURETTAGE/HYSTEROSCOPY WITH MYOSURE N/A 08/31/2023   Procedure: DILATATION & CURETTAGE/HYSTEROSCOPY WITH MYOSURE;  Surgeon: Eldonna Mays, MD;  Location: [facility];  Service: Gynecology;  Laterality: N/A;   ROBOTIC ASSISTED BILATERAL SALPINGO OOPHERECTOMY Bilateral 08/31/2023   Procedure: XI ROBOTIC ASSISTED BILATERAL SALPINGO OOPHORECTOMY;  Surgeon: Eldonna Mays, MD;  Location: [facility];  Service: Gynecology;  Laterality: Bilateral;   THROAT SURGERY     Reports polyps removed from throat, done at  [facility] approx 15 years ago   TONSILLECTOMY     TUBAL LIGATION Bilateral    WISDOM TOOTH EXTRACTION      Family History  Problem Relation Age of Onset   Stomach cancer Mother    Diabetes Mother    Hypertension Mother    Dementia Mother    Diabetes Father    Hypertension Father    Leukemia Father    Diabetes Sister    Diabetes Sister    Diabetes Brother    Hypertension Brother    Colon cancer Brother 50   Diabetes Brother    Diabetes Maternal Grandmother    Hypertension Maternal Grandmother    Diabetes Daughter    Hypertension Daughter    Diabetes Son    Cancer Paternal Uncle        unknown cancer   Colon polyps Neg Hx    Esophageal cancer Neg Hx    Rectal cancer Neg Hx    Breast cancer Neg Hx    Ovarian cancer Neg Hx    Endometrial cancer Neg Hx     Social History:  reports that she quit smoking about 3 months ago. Her smoking use included cigarettes. She started smoking about 53 years ago. She has a 26.6 pack-year smoking history. She has been exposed to tobacco smoke. She has never used smokeless tobacco. She reports current alcohol use of about 5.0 standard drinks of alcohol per week. She reports current drug use. Drug: Marijuana.  Allergies: No Known Allergies  Medications:  I have reviewed the patient's current medications.  The PMH, PSH, Medications, Allergies, and SH were reviewed and updated.  ROS: Constitutional: Negative for fever, weight loss and weight gain. Cardiovascular: Negative for chest pain and dyspnea on exertion. Respiratory: Is not experiencing shortness of breath at rest. Gastrointestinal: Negative for nausea and vomiting. Neurological: Negative for headaches. Psychiatric: The patient is not nervous/anxious  There were no vitals taken for this visit.  PHYSICAL EXAM:  Exam: General: Well-developed, well-nourished Communication and Voice: Clear pitch and clarity Respiratory Respiratory effort: Equal inspiration and expiration without  stridor Cardiovascular Peripheral Vascular: Warm extremities with equal color/perfusion Eyes: No nystagmus with equal extraocular motion bilaterally Neuro/Psych/Balance: Patient oriented to person, place, and time; Appropriate mood and affect; Gait is intact with no imbalance; Cranial nerves I-XII are intact Head and Face Inspection: Normocephalic and atraumatic without mass or lesion Palpation: Facial skeleton intact without bony stepoffs Salivary Glands: No mass or tenderness Facial Strength: Facial motility symmetric and full bilaterally ENT Pinna: External ear intact and fully developed External canal: Canal is patent with intact skin Tympanic Membrane: Clear and mobile External Nose: No scar or anatomic deformity Internal Nose: Septum intact and midline. No edema, polyp, or rhinorrhea Lips, Teeth, and gums: Mucosa and teeth intact and viable TMJ: No pain to palpation with full mobility Oral cavity/oropharynx: No erythema or exudate, no lesions present Neck Neck and Trachea: Midline trachea without mass or lesion Thyroid: No mass or nodularity Lymphatics: No lymphadenopathy  Procedure: Procedure: Cerumen Removal, Bilateral (CPT [id number])  Diagnosis: cerumen impaction, bilateral   Informed consent: Timeout performed and informed consent was obtained.  Procedure: Operating microscope was employed to evaluate the ear(s).  Cerumen curette, speculum and suction were employed to clear the cerumen.   Findings: Normal appearing tympanic membrane on the left without perforations, and external canals are normal after removal of cerumen.No middle ear fluid bilaterally.   Complications: None. Patient tolerated well.       Studies Reviewed: CT neck 03/23/24 Pharynx and larynx: No masses or inflammatory changes are present.   Salivary glands: There is a 1-2 mm calculus present near the origin of the parotid duct seen on image 32 of series 2. There is moderate soft tissue stranding  of the overlying fat. There are small intraparotid lymph nodes. The submandibular glands are unremarkable.   Thyroid: 8 mm round nodule within the right lobe. Thyroid is otherwise unremarkable.   Lymph nodes: There several shotty level 1 and level 2 lymph nodes present, primarily on the left in the submandibular region. No pathologically enlarged or morphologically suspicious nodules.   Vascular: Mild calcific plaque within the carotid bulbs without flow-limiting stenosis.   Limited intracranial: Unremarkable.   Visualized orbits: Negative.   Mastoids and visualized paranasal sinuses: Clear.   Skeleton: Mild degenerative changes within the cervical spine. No osseous lesions.   Upper chest: The lung apices are clear.  Multiple peripheral blebs.   Other: None. IMPRESSION: 1. Left parotiditis likely secondary to small calculus at the orifice of the parotid duct.  Assessment/Plan: No diagnosis found.  ***  Thank you for allowing me to participate in the care of this patient. Please do not hesitate to contact me with any questions or concerns.   Elena Larry, MD Otolaryngology [facility] Phone: [phone] Fax: [phone]    04/06/2024, 7:35 AM

## 2024-04-13 ENCOUNTER — Other Ambulatory Visit: Payer: Self-pay | Admitting: Pulmonary Disease

## 2024-04-13 ENCOUNTER — Other Ambulatory Visit: Payer: Self-pay

## 2024-04-13 DIAGNOSIS — I10 Essential (primary) hypertension: Secondary | ICD-10-CM

## 2024-04-13 DIAGNOSIS — J432 Centrilobular emphysema: Secondary | ICD-10-CM

## 2024-04-14 ENCOUNTER — Other Ambulatory Visit: Payer: Self-pay

## 2024-04-17 MED ORDER — SPIRIVA RESPIMAT 2.5 MCG/ACT IN AERS: 2.0000 | INHALATION_SPRAY | Freq: Every day | RESPIRATORY_TRACT | 3 refills | Status: DC

## 2024-04-17 MED ORDER — ATORVASTATIN CALCIUM 40 MG PO TABS: 40.0000 mg | ORAL_TABLET | Freq: Every day | ORAL | 0 refills | Status: DC

## 2024-04-19 ENCOUNTER — Other Ambulatory Visit: Payer: Self-pay | Admitting: Family Medicine

## 2024-04-19 DIAGNOSIS — I10 Essential (primary) hypertension: Secondary | ICD-10-CM

## 2024-04-20 ENCOUNTER — Telehealth: Payer: Self-pay

## 2024-04-20 ENCOUNTER — Institutional Professional Consult (permissible substitution) (INDEPENDENT_AMBULATORY_CARE_PROVIDER_SITE_OTHER): Admitting: Otolaryngology

## 2024-04-20 NOTE — Telephone Encounter (Signed)
 Patient calls nurse line requesting alternative location for ENT referral.    She reports that ENT she was supposed to see today, was too far of a drive. She states that she needs something more local in [place].   Advised that previous office she was referred to was located on [address] in [place].   She states that GPS had her going too far out into the direction of [place] and this is too far for her to drive.   Will forward to [facility] for possible alternatives.   Chiquita JAYSON English, RN

## 2024-04-21 ENCOUNTER — Ambulatory Visit: Admitting: Physician Assistant

## 2024-04-21 ENCOUNTER — Other Ambulatory Visit (INDEPENDENT_AMBULATORY_CARE_PROVIDER_SITE_OTHER): Payer: Self-pay

## 2024-04-21 DIAGNOSIS — G8929 Other chronic pain: Secondary | ICD-10-CM | POA: Diagnosis not present

## 2024-04-21 DIAGNOSIS — M5442 Lumbago with sciatica, left side: Secondary | ICD-10-CM

## 2024-04-21 MED ORDER — METHOCARBAMOL 500 MG PO TABS: 500.0000 mg | ORAL_TABLET | Freq: Two times a day (BID) | ORAL | 1 refills | Status: DC | PRN

## 2024-04-21 MED ORDER — METHYLPREDNISOLONE 4 MG PO TBPK: ORAL_TABLET | ORAL | 0 refills | Status: DC

## 2024-04-21 NOTE — Progress Notes (Signed)
 Office Visit Note   Patient: Alexandra Campos           Date of Birth: 1956/02/23           MRN: [id number] Visit Date: 04/21/2024              Requested by: Lafe Domino, DO [address] [place],  [place] [address] PCP: Lafe Domino, DO   Assessment & Plan: Visit Diagnoses:  1. Chronic midline low back pain with left-sided sciatica     Plan: Impression is chronic low back pain with left lower extremity radiculopathy as well as left hip arthritis.  I believe the patient's symptoms appear to be most consistent with lumbar pathology today.  We have discussed starting her on a Medrol  Dosepak as well as starting her in outpatient physical therapy for which she is agreeable to.  She will follow-up with us  as needed.  Follow-Up Instructions: Return if symptoms worsen or fail to improve.   Orders:  Orders Placed This Encounter  Procedures   XR Lumbar Spine 2-3 Views   Ambulatory referral to Physical Therapy   Meds ordered this encounter  Medications   methylPREDNISolone  (MEDROL  DOSEPAK) 4 MG TBPK tablet    Sig: Take as directed on package    Dispense:  21 tablet    Refill:  0   methocarbamol  (ROBAXIN ) 500 MG tablet    Sig: Take 1 tablet (500 mg total) by mouth 2 (two) times daily as needed.    Dispense:  20 tablet    Refill:  1      Procedures: No procedures performed   Clinical Data: No additional findings.   Subjective: Chief Complaint  Patient presents with   Lower Back - Pain   Left Hip - Pain    HPI patient is a pleasant 68 year old female who comes in today with low back pain and left lower extremity radiculopathy.  She has had low back pain for quite some time which was initially intermittent but has become more constant over the past few weeks.  She denies having had an injury or change in activity.  The pain seems to go across the entire low back and down the left buttock, posterior left leg and into the foot where she has associated numbness.  Pain is  constant but worse with walking as well as certain movements.  She occasionally notes weakness to the left lower extremity.  She denies any other numbness other than to her toes.  She has been taking Tylenol  arthritis or Tylenol  3 without significant relief.  She has been walking with primarily a cane sometimes rollator.  No previous back injection but thinks she may have gone to PT for her back years ago.  Currently denies any bowel or bladder change or saddle paresthesias.  Review of Systems as detailed in HPI.  All others reviewed and are negative.   Objective: Vital Signs: There were no vitals taken for this visit.  Physical Exam well-developed well-nourished female in no acute distress.  Alert and oriented x 3.  Ortho Exam lumbar spine exam: No spinous tenderness.  She does have moderate tenderness to the lumbar paraspinous musculature on the left.  No pain with lumbar flexion, extension or rotation.  She has pain with straight leg raise on the left.  No focal weakness.  She does have pain with logroll and FADIR testing.  She is neurovascularly intact distally.  Specialty Comments:  No specialty comments available.  Imaging: XR Lumbar Spine  2-3 Views Result Date: 04/21/2024 X-rays demonstrate advanced multilevel degenerative changes.  She also has moderate degenerative changes to both hip joints.    PMFS History: Patient Active Problem List   Diagnosis Date Noted   Acne vulgaris 03/09/2024   Glossodynia 02/03/2024   Leg swelling 09/24/2023   Tobacco use 09/24/2023   Adnexal mass 08/31/2023   Pre-op evaluation 08/16/2023   COPD with acute exacerbation (HCC) 08/03/2023   Obstructive lung disease (HCC) 07/09/2022   Low back pain 09/22/2020   Atherosclerosis of artery of extremity with rest pain (HCC) 03/15/2020   Ovarian cyst 03/09/2020   Postmenopausal bleeding 02/07/2020   Type 2 DM with CKD stage 3 and hypertension (HCC) 07/03/2019   Class 3 severe obesity with body mass  index (BMI) of 40.0 to 44.9 in adult 06/16/2019   CKD (chronic kidney disease) stage 3, GFR 30-59 ml/min (HCC) 06/16/2019   Healthcare maintenance 06/16/2019   Carpal tunnel syndrome on right 06/16/2019   Hypertension 08/04/2018   Tobacco dependence 08/04/2018   Past Medical History:  Diagnosis Date   Abnormal ankle brachial index (ABI) 01/14/2020   Abnormal facial hair 11/24/2019   Arthritis    Chronic kidney disease    Complex cyst of right ovary 04/09/2020   Likely a dermoid. Normal CA 125     COPD (chronic obstructive pulmonary disease) (HCC)    Diabetes mellitus without complication (HCC)    GERD (gastroesophageal reflux disease)    Hyperlipidemia    Hypertension    Ovarian cyst 03/09/2020   CA-125 17.3     Peripheral vascular disease (HCC)    Postmenopausal bleeding 02/07/2020   Rash 04/20/2022   Right upper quadrant pain 01/21/2022   Stress incontinence of urine 10/03/2019   Substance abuse (HCC)    ETOH, THC   Wheezing 12/13/2020   Yeast infection involving the vagina and surrounding area 06/10/2020    Family History  Problem Relation Age of Onset   Stomach cancer Mother    Diabetes Mother    Hypertension Mother    Dementia Mother    Diabetes Father    Hypertension Father    Leukemia Father    Diabetes Sister    Diabetes Sister    Diabetes Brother    Hypertension Brother    Colon cancer Brother 50   Diabetes Brother    Diabetes Maternal Grandmother    Hypertension Maternal Grandmother    Diabetes Daughter    Hypertension Daughter    Diabetes Son    Cancer Paternal Uncle        unknown cancer   Colon polyps Neg Hx    Esophageal cancer Neg Hx    Rectal cancer Neg Hx    Breast cancer Neg Hx    Ovarian cancer Neg Hx    Endometrial cancer Neg Hx     Past Surgical History:  Procedure Laterality Date   ABDOMINAL AORTOGRAM W/LOWER EXTREMITY N/A 03/15/2020   Procedure: ABDOMINAL AORTOGRAM W/LOWER EXTREMITY;  Surgeon: Eliza Lonni RAMAN, MD;   Location: [facility] INVASIVE CV LAB;  Service: Cardiovascular;  Laterality: N/A;   ANKLE SURGERY Right    ORIF   CARPAL TUNNEL RELEASE Right 11/18/2022   Procedure: RIGHT CARPAL TUNNEL RELEASE;  Surgeon: Jerri Kay HERO, MD;  Location: [facility];  Service: Orthopedics;  Laterality: Right;   DILATATION & CURETTAGE/HYSTEROSCOPY WITH MYOSURE N/A 08/31/2023   Procedure: DILATATION & CURETTAGE/HYSTEROSCOPY WITH MYOSURE;  Surgeon: Eldonna Mays, MD;  Location: [facility];  Service: Gynecology;  Laterality:  N/A;   ROBOTIC ASSISTED BILATERAL SALPINGO OOPHERECTOMY Bilateral 08/31/2023   Procedure: XI ROBOTIC ASSISTED BILATERAL SALPINGO OOPHORECTOMY;  Surgeon: Eldonna Mays, MD;  Location: [facility];  Service: Gynecology;  Laterality: Bilateral;   THROAT SURGERY     Reports polyps removed from throat, done at [facility] approx 15 years ago   TONSILLECTOMY     TUBAL LIGATION Bilateral    WISDOM TOOTH EXTRACTION     Social History   Occupational History   Occupation: CNA    Comment: Retired  Tobacco Use   Smoking status: Former    Current packs/day: 0.00    Average packs/day: 0.5 packs/day for 53.3 years (26.6 ttl pk-yrs)    Types: Cigarettes    Start date: 16    Quit date: 12/28/2023    Years since quitting: 0.3    Passive exposure: Current   Smokeless tobacco: Never   Tobacco comments:    Patient stated that she quit smoking 10/16/2023.  Vaping Use   Vaping status: Never Used  Substance and Sexual Activity   Alcohol use: Yes    Alcohol/week: 5.0 standard drinks of alcohol    Types: 5 Cans of beer per week    Comment: 5 drinks/week    Drug use: Yes    Types: Marijuana    Comment: last smoked cig today, last marijuana 11/16/2022   Sexual activity: Not Currently

## 2024-04-24 ENCOUNTER — Other Ambulatory Visit: Payer: Self-pay

## 2024-04-24 MED ORDER — OZEMPIC (2 MG/DOSE) 8 MG/3ML ~~LOC~~ SOPN: 2.0000 mg | PEN_INJECTOR | SUBCUTANEOUS | 5 refills | Status: DC

## 2024-04-24 NOTE — Telephone Encounter (Signed)
 Patient returns call to nurse line regarding update on ENT.   Advised that I would send message to referral coordinator and we would let her know when we have an update.   Chiquita JAYSON English, RN

## 2024-05-01 ENCOUNTER — Ambulatory Visit: Attending: Physician Assistant | Admitting: Physical Therapy

## 2024-05-01 ENCOUNTER — Other Ambulatory Visit: Payer: Self-pay

## 2024-05-01 DIAGNOSIS — M5442 Lumbago with sciatica, left side: Secondary | ICD-10-CM | POA: Diagnosis not present

## 2024-05-01 DIAGNOSIS — M5459 Other low back pain: Secondary | ICD-10-CM | POA: Diagnosis not present

## 2024-05-01 DIAGNOSIS — G8929 Other chronic pain: Secondary | ICD-10-CM | POA: Insufficient documentation

## 2024-05-01 DIAGNOSIS — M6281 Muscle weakness (generalized): Secondary | ICD-10-CM | POA: Insufficient documentation

## 2024-05-01 NOTE — Therapy (Signed)
 OUTPATIENT PHYSICAL THERAPY THORACOLUMBAR EVALUATION   Patient Name: Alexandra Campos MRN: [id number] DOB:1956/08/30, 68 y.o., female Today's Date: 05/01/2024  END OF SESSION:  PT End of Session - 05/01/24 1611     Visit Number 1    Number of Visits 7    Date for PT Re-Evaluation 06/12/24    PT Start Time 1530    PT Stop Time 1608    PT Time Calculation (min) 38 min    Activity Tolerance Patient tolerated treatment well    Behavior During Therapy [facility] for tasks assessed/performed          Past Medical History:  Diagnosis Date   Abnormal ankle brachial index (ABI) 01/14/2020   Abnormal facial hair 11/24/2019   Arthritis    Chronic kidney disease    Complex cyst of right ovary 04/09/2020   Likely a dermoid. Normal CA 125     COPD (chronic obstructive pulmonary disease) (HCC)    Diabetes mellitus without complication (HCC)    GERD (gastroesophageal reflux disease)    Hyperlipidemia    Hypertension    Ovarian cyst 03/09/2020   CA-125 17.3     Peripheral vascular disease (HCC)    Postmenopausal bleeding 02/07/2020   Rash 04/20/2022   Right upper quadrant pain 01/21/2022   Stress incontinence of urine 10/03/2019   Substance abuse (HCC)    ETOH, THC   Wheezing 12/13/2020   Yeast infection involving the vagina and surrounding area 06/10/2020   Past Surgical History:  Procedure Laterality Date   ABDOMINAL AORTOGRAM W/LOWER EXTREMITY N/A 03/15/2020   Procedure: ABDOMINAL AORTOGRAM W/LOWER EXTREMITY;  Surgeon: Eliza Lonni RAMAN, MD;  [facility];  Service: Cardiovascular;  Laterality: N/A;   ANKLE SURGERY Right    ORIF   CARPAL TUNNEL RELEASE Right 11/18/2022   Procedure: RIGHT CARPAL TUNNEL RELEASE;  Surgeon: Jerri Kay HERO, MD;  Location: [facility];  Service: Orthopedics;  Laterality: Right;   DILATATION & CURETTAGE/HYSTEROSCOPY WITH MYOSURE N/A 08/31/2023   Procedure: DILATATION & CURETTAGE/HYSTEROSCOPY WITH MYOSURE;  Surgeon: Eldonna Mays, MD;  Location: [facility];  Service: Gynecology;  Laterality: N/A;   ROBOTIC ASSISTED BILATERAL SALPINGO OOPHERECTOMY Bilateral 08/31/2023   Procedure: XI ROBOTIC ASSISTED BILATERAL SALPINGO OOPHORECTOMY;  Surgeon: Eldonna Mays, MD;  Location: [facility];  Service: Gynecology;  Laterality: Bilateral;   THROAT SURGERY     Reports polyps removed from throat, done at [facility] approx 15 years ago   TONSILLECTOMY     TUBAL LIGATION Bilateral    WISDOM TOOTH EXTRACTION     Patient Active Problem List   Diagnosis Date Noted   Acne vulgaris 03/09/2024   Glossodynia 02/03/2024   Leg swelling 09/24/2023   Tobacco use 09/24/2023   Adnexal mass 08/31/2023   Pre-op evaluation 08/16/2023   COPD with acute exacerbation (HCC) 08/03/2023   Obstructive lung disease (HCC) 07/09/2022   Low back pain 09/22/2020   Atherosclerosis of artery of extremity with rest pain (HCC) 03/15/2020   Ovarian cyst 03/09/2020   Postmenopausal bleeding 02/07/2020   Type 2 DM with CKD stage 3 and hypertension (HCC) 07/03/2019   Class 3 severe obesity with body mass index (BMI) of 40.0 to 44.9 in adult 06/16/2019   CKD (chronic kidney disease) stage 3, GFR 30-59 ml/min (HCC) 06/16/2019   Healthcare maintenance 06/16/2019   Carpal tunnel syndrome on right 06/16/2019   Hypertension 08/04/2018   Tobacco dependence 08/04/2018    PCP: Lafe Domino, DO  REFERRING PROVIDER: Jule Shuck  L, PA-C  REFERRING DIAG: M54.42,G89.29 (ICD-10-CM) - Chronic midline low back pain with left-sided sciatica   Rationale for Evaluation and Treatment: Rehabilitation  THERAPY DIAG:  Other low back pain - Plan: PT plan of care cert/re-cert  Muscle weakness (generalized) - Plan: PT plan of care cert/re-cert  PERTINENT HISTORY: HTN, DM2, COPD, CKD3,  WEIGHT BEARING RESTRICTIONS: No  FALLS:  Has patient fallen in last 6 months? No  LIVING ENVIRONMENT: Lives with: lives alone Lives in: House/apartment Stairs: Yes: Internal: 12  steps; uses elevator Has following equipment at home: Single point cane and Walker - 4 wheeled  OCCUPATION: not currently working   PRECAUTIONS: None ---------------------------------------------------------------------------------------------  SUBJECTIVE:                                                                                                                                                                                           SUBJECTIVE STATEMENT: Eval statement 05/01/2024: LBP that has gotten worse in the past 3 weeks. Not sure if it is related to a blocked artery in the hip and groin. Ortho told her it was not related and was sent here for treatment.  Walking and standing for ADLs increases pain. 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   Pain currently 5/10, has n/t in B feet at night in the toes, may be related to DM2. Pt isnt sure if the n/t was present prior to back pain.  RED FLAGS: None    PLOF: Independent  PATIENT GOALS: reduce pain  NEXT MD VISIT: September 2nd with PCP , ortho is 6week follow up ---------------------------------------------------------------------------------------------  OBJECTIVE:  Note: Objective measures were completed at Evaluation unless otherwise noted.  DIAGNOSTIC FINDINGS:  X-rays demonstrate advanced multilevel degenerative changes.  She also has  moderate degenerative changes to both hip joints.   PATIENT SURVEYS:  MODI:31/50 (62%)  COGNITION: Overall cognitive status: Within functional limits for tasks assessed   PALPATION: Tenderness to sacral sulcus and diffuse lumbar spinous processes  Lumbar contraction pattern  L Multifidus: proper pattern, weak contraction  R Multifidus: proper pattern, weak contraction   SENSATION: Light touch: Impaired   MUSCLE LENGTH: Hamstrings: Right 15; deg; Left 15 deg  POSTURE: rounded shoulders, forward head, and increased lumbar  lordosis   LUMBAR ROM:   AROM eval  Flexion 80%!  Extension 100%  Right lateral flexion 70%  Left lateral flexion 70%  Right rotation 40%  Left rotation 40%   (Blank rows = not tested)  ! Indicates pain with testing  LOWER EXTREMITY ROM:     Active  Right eval Left  eval  Hip flexion    Hip extension    Hip abduction    Hip adduction    Hip internal rotation    Hip external rotation    Knee flexion    Knee extension    Ankle dorsiflexion    Ankle plantarflexion    Ankle inversion    Ankle eversion     (Blank rows = not tested)  ! Indicates pain with testing  LOWER EXTREMITY MMT:    MMT Right eval Left eval  Hip flexion 4+ 4-  Hip extension 4- 4-  Hip abduction 4- 4-  Hip adduction    Hip internal rotation    Hip external rotation    Knee flexion 4+ 4  Knee extension 5 5  Ankle dorsiflexion    Ankle plantarflexion    Ankle inversion    Ankle eversion     (Blank rows = not tested)   ! Indicates pain with testing LUMBAR SPECIAL TESTS:  Prone instability test: Positive  FUNCTIONAL TESTS:  30 seconds chair stand test 8  GAIT: Distance walked: 123ft Assistive device utilized: Single point cane Level of assistance: Modified independence Comments: decreased step length  [facility] Adult PT Treatment:                                                DATE: 05/01/2024 Self Care: Pt education POC discussion                                                                                                                                PATIENT EDUCATION:  Education details: Pt received education regarding HEP performance, ADL performance, functional activity tolerance, impairment education, appropriate performance of therapeutic activities.  Person educated: Patient Education method: Explanation, Demonstration, Tactile cues, Verbal cues, and Handouts Education comprehension: verbalized understanding and returned demonstration  HOME EXERCISE PROGRAM: Access Code:  [email or web address] URL: [email or web address] Date: 05/01/2024 Prepared by: Mabel Kiang  Exercises - Supine Posterior Pelvic Tilt  - 1 x daily - 7 x weekly - 2-3 sets - 15 reps - Supine 90/90 Alternating Heel Touches with Posterior Pelvic Tilt  - 1 x daily - 4 x weekly - 2-3 sets - 12 reps - Supine Bridge  - 1 x daily - 4 x weekly - 2-3 sets - 12 reps - 4s hold - Hooklying Single Leg Bent Knee Fallouts with Resistance  - 1 x daily - 4 x weekly - 2-3 sets - 12 reps - 2s hold ---------------------------------------------------------------------------------------------  ASSESSMENT:  CLINICAL IMPRESSION: Eval impression (05/01/2024): Pt. attended today's physical therapy session for evaluation of LBP. Pt has complaints of 5/10 LBP onset 3 weeks that worsens with prolonged walking or standing. Pt has notable deficits and would benefit from therapeutic focus on lumbar stability, lumbar AROM, transfer quality, proximal hip strengthening, dynamic  core strength/activation, and activity tolerance. Treatment performed today focused on pt education detailed in the objective. Pt demonstrated great understanding of education provided. required minimal v/t cues and no pysical assistance for appropriate performance with today's activities. Pt requires the intervention of skilled outpatient physical therapy to address the aforementioned deficits and progress towards a functional level in line with therapeutic goals.    OBJECTIVE IMPAIRMENTS: Abnormal gait, decreased activity tolerance, decreased mobility, difficulty walking, decreased ROM, decreased strength, hypomobility, improper body mechanics, postural dysfunction, and pain.   ACTIVITY LIMITATIONS: carrying, lifting, standing, squatting, and locomotion level  PARTICIPATION LIMITATIONS: meal prep, cleaning, laundry, and community activity  PERSONAL FACTORS: Fitness, Past/current experiences, and 3+ comorbidities: see pmh are also affecting  patient's functional outcome.   REHAB POTENTIAL: Fair complex PMH  CLINICAL DECISION MAKING: Stable/uncomplicated  EVALUATION COMPLEXITY: Low   GOALS: Goals reviewed with patient? YES  SHORT TERM GOALS: Target date: 05/22/2024  Pt will be independent with administered HEP to demonstrate the competency necessary for long term managemnet of symptoms at home. Baseline: Goal status: INITIAL  LONG TERM GOALS: Target date: 06/12/2024  Pt. Will achieve a MODI score of 25/50 (50%) as to demonstrate improvement in self-perceived functional ability with daily activities.  Baseline:  Goal status: INITIAL  2.  Pt will improve Global hip strength to a 4+/5 to demonstrate improvement in strength for quality of motion and activity performance.  Baseline:  Goal status: INITIAL  3.  Pt will improve Lumbar AROM to 90% of standardized norms with less than 3/10 pain to demonstrate necessary mobility for high quality and safe ADLs  Baseline:  Goal status: INITIAL  4.  Pt will report the ability to stand for >/= 15 minutes as to demonstrate improved tolerance to standing for prolonged time and improved ability to participate in ADLs.  Baseline:10 minutes  Goal status: INITIAL 5. Pt will improve 30s STS to at least 11 to demonstrate improved transfer quality, BLE strength, coordination, and BLE endurance necessary for higher activity tolerance in the community. ---------------------------------------------------------------------------------------------  PLAN:  PT FREQUENCY: 1-2x/week  PT DURATION: 6 weeks  PLANNED INTERVENTIONS: 97110-Therapeutic exercises, 97530- Therapeutic activity, 97112- Neuromuscular re-education, 97535- Self Care, 02859- Manual therapy, [phone]- Gait training, Patient/Family education, Balance training, Stair training, Joint mobilization, Spinal mobilization, and Moist heat.  PLAN FOR NEXT SESSION: Review HEP, Begin POC as detailed in assessment   Mabel Kiang, PT,  DPT 05/01/2024, 4:14 PM   Referring diagnosis?  Chronic midline low back pain with left-sided sciatica [M54.42, G89.29]  Treatment diagnosis? (if different than referring diagnosis)  back pain, other. What was this (referring dx) caused by? []  Surgery []  Fall [x]  Ongoing issue [x]  Arthritis []  Other: ____________  Laterality: []  Rt []  Lt [x]  Both  Check all possible CPT codes:    97110-Therapeutic exercises, 97530- Therapeutic activity, 97112- Neuromuscular re-education, 97535- Self Care, 02859- Manual therapy, [phone]- Gait training, Patient/Family education, Balance training, Stair training, Joint mobilization, Spinal mobilization, and Moist heat.

## 2024-05-10 ENCOUNTER — Ambulatory Visit: Admitting: Physical Therapy

## 2024-05-10 ENCOUNTER — Encounter: Payer: Self-pay | Admitting: Physical Therapy

## 2024-05-10 DIAGNOSIS — M6281 Muscle weakness (generalized): Secondary | ICD-10-CM | POA: Diagnosis not present

## 2024-05-10 DIAGNOSIS — M5459 Other low back pain: Secondary | ICD-10-CM | POA: Diagnosis not present

## 2024-05-10 DIAGNOSIS — M5442 Lumbago with sciatica, left side: Secondary | ICD-10-CM | POA: Diagnosis not present

## 2024-05-10 DIAGNOSIS — K115 Sialolithiasis: Secondary | ICD-10-CM | POA: Diagnosis not present

## 2024-05-10 DIAGNOSIS — G8929 Other chronic pain: Secondary | ICD-10-CM | POA: Diagnosis not present

## 2024-05-10 NOTE — Therapy (Signed)
 OUTPATIENT PHYSICAL THERAPY THORACOLUMBAR EVALUATION   Patient Name: Alexandra Campos MRN: [id number] DOB:02-04-1956, 68 y.o., female Today's Date: 05/10/2024  END OF SESSION:  PT End of Session - 05/10/24 0904     Visit Number 2    Number of Visits 7    Date for PT Re-Evaluation 06/12/24    PT Start Time 0915    PT Stop Time 0955    PT Time Calculation (min) 40 min    Activity Tolerance Patient tolerated treatment well    Behavior During Therapy [employer] for tasks assessed/performed          Past Medical History:  Diagnosis Date   Abnormal ankle brachial index (ABI) 01/14/2020   Abnormal facial hair 11/24/2019   Arthritis    Chronic kidney disease    Complex cyst of right ovary 04/09/2020   Likely a dermoid. Normal CA 125     COPD (chronic obstructive pulmonary disease) (HCC)    Diabetes mellitus without complication (HCC)    GERD (gastroesophageal reflux disease)    Hyperlipidemia    Hypertension    Ovarian cyst 03/09/2020   CA-125 17.3     Peripheral vascular disease (HCC)    Postmenopausal bleeding 02/07/2020   Rash 04/20/2022   Right upper quadrant pain 01/21/2022   Stress incontinence of urine 10/03/2019   Substance abuse (HCC)    ETOH, THC   Wheezing 12/13/2020   Yeast infection involving the vagina and surrounding area 06/10/2020   Past Surgical History:  Procedure Laterality Date   ABDOMINAL AORTOGRAM W/LOWER EXTREMITY N/A 03/15/2020   Procedure: ABDOMINAL AORTOGRAM W/LOWER EXTREMITY;  Surgeon: Eliza Lonni RAMAN, MD;  Location: [facility] INVASIVE CV LAB;  Service: Cardiovascular;  Laterality: N/A;   ANKLE SURGERY Right    ORIF   CARPAL TUNNEL RELEASE Right 11/18/2022   Procedure: RIGHT CARPAL TUNNEL RELEASE;  Surgeon: Jerri Kay HERO, MD;  Location: [facility];  Service: Orthopedics;  Laterality: Right;   DILATATION & CURETTAGE/HYSTEROSCOPY WITH MYOSURE N/A 08/31/2023   Procedure: DILATATION & CURETTAGE/HYSTEROSCOPY WITH MYOSURE;  Surgeon: Eldonna Mays, MD;  Location: [facility];  Service: Gynecology;  Laterality: N/A;   ROBOTIC ASSISTED BILATERAL SALPINGO OOPHERECTOMY Bilateral 08/31/2023   Procedure: XI ROBOTIC ASSISTED BILATERAL SALPINGO OOPHORECTOMY;  Surgeon: Eldonna Mays, MD;  Location: [facility];  Service: Gynecology;  Laterality: Bilateral;   THROAT SURGERY     Reports polyps removed from throat, done at [facility] approx 15 years ago   TONSILLECTOMY     TUBAL LIGATION Bilateral    WISDOM TOOTH EXTRACTION     Patient Active Problem List   Diagnosis Date Noted   Acne vulgaris 03/09/2024   Glossodynia 02/03/2024   Leg swelling 09/24/2023   Tobacco use 09/24/2023   Adnexal mass 08/31/2023   Pre-op evaluation 08/16/2023   COPD with acute exacerbation (HCC) 08/03/2023   Obstructive lung disease (HCC) 07/09/2022   Low back pain 09/22/2020   Atherosclerosis of artery of extremity with rest pain (HCC) 03/15/2020   Ovarian cyst 03/09/2020   Postmenopausal bleeding 02/07/2020   Type 2 DM with CKD stage 3 and hypertension (HCC) 07/03/2019   Class 3 severe obesity with body mass index (BMI) of 40.0 to 44.9 in adult 06/16/2019   CKD (chronic kidney disease) stage 3, GFR 30-59 ml/min (HCC) 06/16/2019   Healthcare maintenance 06/16/2019   Carpal tunnel syndrome on right 06/16/2019   Hypertension 08/04/2018   Tobacco dependence 08/04/2018    PCP: Lafe Domino, DO  REFERRING PROVIDER: Jule Shuck  L, PA-C  REFERRING DIAG: M54.42,G89.29 (ICD-10-CM) - Chronic midline low back pain with left-sided sciatica   Rationale for Evaluation and Treatment: Rehabilitation  THERAPY DIAG:  Other low back pain  Muscle weakness (generalized)  PERTINENT HISTORY: HTN, DM2, COPD, CKD3,  WEIGHT BEARING RESTRICTIONS: No  FALLS:  Has patient fallen in last 6 months? No  LIVING ENVIRONMENT: Lives with: lives alone Lives in: House/apartment Stairs: Yes: Internal: 12 steps; uses elevator Has following equipment at home: Single point cane  and Walker - 4 wheeled  OCCUPATION: not currently working   PRECAUTIONS: None ---------------------------------------------------------------------------------------------  SUBJECTIVE:                                                                                                                                                                                           SUBJECTIVE STATEMENT: Pt attended today's session with reports of 3/10 pain. Pt stated that they have maintained good compliance with current HEP.  Getting an exercise mat this weekend so she can perform HEP on the floor, more  comfortably   Eval statement 05/01/2024: LBP that has gotten worse in the past 3 weeks. Not sure if it is related to a blocked artery in the hip and groin. Ortho told her it was not related and was sent here for treatment.  Walking and standing for ADLs increases pain. 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   Pain currently 5/10, has n/t in B feet at night in the toes, may be related to DM2. Pt isnt sure if the n/t was present prior to back pain.  RED FLAGS: None    PLOF: Independent  PATIENT GOALS: reduce pain  NEXT MD VISIT: September 2nd with PCP , ortho is 6week follow up ---------------------------------------------------------------------------------------------  OBJECTIVE:  Note: Objective measures were completed at Evaluation unless otherwise noted.  DIAGNOSTIC FINDINGS:  X-rays demonstrate advanced multilevel degenerative changes.  She also has  moderate degenerative changes to both hip joints.   PATIENT SURVEYS:  MODI:31/50 (62%)  COGNITION: Overall cognitive status: Within functional limits for tasks assessed   PALPATION: Tenderness to sacral sulcus and diffuse lumbar spinous processes  Lumbar contraction pattern  L Multifidus: proper pattern, weak contraction  R Multifidus: proper pattern, weak  contraction   SENSATION: Light touch: Impaired   MUSCLE LENGTH: Hamstrings: Right 15; deg; Left 15 deg  POSTURE: rounded shoulders, forward head, and increased lumbar lordosis   LUMBAR ROM:   AROM eval  Flexion 80%!  Extension 100%  Right lateral flexion 70%  Left lateral flexion 70%  Right rotation 40%  Left rotation 40%   (  Blank rows = not tested)  ! Indicates pain with testing  LOWER EXTREMITY ROM:     Active  Right eval Left eval  Hip flexion    Hip extension    Hip abduction    Hip adduction    Hip internal rotation    Hip external rotation    Knee flexion    Knee extension    Ankle dorsiflexion    Ankle plantarflexion    Ankle inversion    Ankle eversion     (Blank rows = not tested)  ! Indicates pain with testing  LOWER EXTREMITY MMT:    MMT Right eval Left eval  Hip flexion 4+ 4-  Hip extension 4- 4-  Hip abduction 4- 4-  Hip adduction    Hip internal rotation    Hip external rotation    Knee flexion 4+ 4  Knee extension 5 5  Ankle dorsiflexion    Ankle plantarflexion    Ankle inversion    Ankle eversion     (Blank rows = not tested)   ! Indicates pain with testing LUMBAR SPECIAL TESTS:  Prone instability test: Positive  FUNCTIONAL TESTS:  30 seconds chair stand test 8  GAIT: Distance walked: 133ft Assistive device utilized: Single point cane Level of assistance: Modified independence Comments: decreased step length TREATMENT:  [facility] Adult PT Treatment:                                                DATE: 05/10/2024   Neuromuscular re-ed: Supine bridge on Pball 2x12, hold 3s Pball roll up 2x15, hold 2s Hamstring seated stretch w/ apt 2x1' Therapeutic Activity: NuStep 8' for activity tolerance Supine QL stretch 4x30s B Supine marching w/PPT 2x12, hold 2s PPT practice    [facility] Adult PT Treatment:                                                DATE: 05/01/2024 Self Care: Pt education POC discussion                                                                                                                                 PATIENT EDUCATION:  Education details: Pt received education regarding HEP performance, ADL performance, functional activity tolerance, impairment education, appropriate performance of therapeutic activities.  Person educated: Patient Education method: Explanation, Demonstration, Tactile cues, Verbal cues, and Handouts Education comprehension: verbalized understanding and returned demonstration  HOME EXERCISE PROGRAM: Access Code: [email or web address] URL: [email or web address] Date: 05/01/2024 Prepared by: Mabel Kiang  Exercises - Supine Posterior Pelvic Tilt  - 1 x daily - 7 x weekly - 2-3 sets - 15 reps - Supine 90/90 Alternating Heel  Touches with Posterior Pelvic Tilt  - 1 x daily - 4 x weekly - 2-3 sets - 12 reps - Supine Bridge  - 1 x daily - 4 x weekly - 2-3 sets - 12 reps - 4s hold - Hooklying Single Leg Bent Knee Fallouts with Resistance  - 1 x daily - 4 x weekly - 2-3 sets - 12 reps - 2s hold ---------------------------------------------------------------------------------------------  ASSESSMENT:  CLINICAL IMPRESSION: Pt attended physical therapy session for continuation of treatment regarding low back pain. Today's treatment focused on improvement of  lumbar stability, mobility, and posterior chain motility. Pt is progressing with reports of improved pain levels, currently 3/10 compared to 5/10 at eval.  Pt showed great tolerance to administered treatment with no adverse effects by the end of session. Skilled intervention was utilized via activity modification for pt tolerance with task completion, functional progression/regression promoting best outcomes inline with current rehab goals, as well as minimal verbal/tactile cuing alongside no physical assistance for safe and appropriate performance of today's activities. Continue to progress with current POC focus as  tolerated.   Eval impression (05/01/2024): Pt. attended today's physical therapy session for evaluation of LBP. Pt has complaints of 5/10 LBP onset 3 weeks that worsens with prolonged walking or standing. Pt has notable deficits and would benefit from therapeutic focus on lumbar stability, lumbar AROM, transfer quality, proximal hip strengthening, dynamic core strength/activation, and activity tolerance. Treatment performed today focused on pt education detailed in the objective. Pt demonstrated great understanding of education provided. required minimal v/t cues and no pysical assistance for appropriate performance with today's activities. Pt requires the intervention of skilled outpatient physical therapy to address the aforementioned deficits and progress towards a functional level in line with therapeutic goals.    OBJECTIVE IMPAIRMENTS: Abnormal gait, decreased activity tolerance, decreased mobility, difficulty walking, decreased ROM, decreased strength, hypomobility, improper body mechanics, postural dysfunction, and pain.   ACTIVITY LIMITATIONS: carrying, lifting, standing, squatting, and locomotion level  PARTICIPATION LIMITATIONS: meal prep, cleaning, laundry, and community activity  PERSONAL FACTORS: Fitness, Past/current experiences, and 3+ comorbidities: see pmh are also affecting patient's functional outcome.   REHAB POTENTIAL: Fair complex PMH  CLINICAL DECISION MAKING: Stable/uncomplicated  EVALUATION COMPLEXITY: Low   GOALS: Goals reviewed with patient? YES  SHORT TERM GOALS: Target date: 05/22/2024  Pt will be independent with administered HEP to demonstrate the competency necessary for long term managemnet of symptoms at home. Baseline: Goal status: INITIAL  LONG TERM GOALS: Target date: 06/12/2024  Pt. Will achieve a MODI score of 25/50 (50%) as to demonstrate improvement in self-perceived functional ability with daily activities.  Baseline:  Goal status:  INITIAL  2.  Pt will improve Global hip strength to a 4+/5 to demonstrate improvement in strength for quality of motion and activity performance.  Baseline:  Goal status: INITIAL  3.  Pt will improve Lumbar AROM to 90% of standardized norms with less than 3/10 pain to demonstrate necessary mobility for high quality and safe ADLs  Baseline:  Goal status: INITIAL  4.  Pt will report the ability to stand for >/= 15 minutes as to demonstrate improved tolerance to standing for prolonged time and improved ability to participate in ADLs.  Baseline:10 minutes  Goal status: INITIAL  5. Pt will improve 30s STS to at least 11 to demonstrate improved transfer quality, BLE strength, coordination, and BLE endurance necessary for higher activity tolerance in the community. Baseline: 8 Goal status: INITIAL ---------------------------------------------------------------------------------------------  PLAN:  PT FREQUENCY: 1-2x/week  PT  DURATION: 6 weeks  PLANNED INTERVENTIONS: 97110-Therapeutic exercises, 97530- Therapeutic activity, [id number]- Neuromuscular re-education, 97535- Self Care, 02859- Manual therapy, [phone]- Gait training, Patient/Family education, Balance training, Stair training, Joint mobilization, Spinal mobilization, and Moist heat.  PLAN FOR NEXT SESSION: progress within current POC as tolerated.   Mabel Kiang, PT, DPT 05/10/2024, 9:55 AM   Referring diagnosis?  Chronic midline low back pain with left-sided sciatica [M54.42, G89.29]  Treatment diagnosis? (if different than referring diagnosis)  back pain, other. What was this (referring dx) caused by? []  Surgery []  Fall [x]  Ongoing issue [x]  Arthritis []  Other: ____________  Laterality: []  Rt []  Lt [x]  Both  Check all possible CPT codes:    97110-Therapeutic exercises, 97530- Therapeutic activity, [id number]- Neuromuscular re-education, 97535- Self Care, 02859- Manual therapy, [phone]- Gait training, Patient/Family education,  Balance training, Stair training, Joint mobilization, Spinal mobilization, and Moist heat.

## 2024-05-13 ENCOUNTER — Other Ambulatory Visit: Payer: Self-pay | Admitting: Family Medicine

## 2024-05-13 DIAGNOSIS — Z8719 Personal history of other diseases of the digestive system: Secondary | ICD-10-CM

## 2024-05-16 ENCOUNTER — Ambulatory Visit (INDEPENDENT_AMBULATORY_CARE_PROVIDER_SITE_OTHER): Payer: Self-pay | Admitting: Family Medicine

## 2024-05-16 ENCOUNTER — Encounter: Payer: Self-pay | Admitting: Family Medicine

## 2024-05-16 VITALS — BP 133/77 | HR 87 | Ht 60.0 in | Wt 191.6 lb

## 2024-05-16 DIAGNOSIS — Z0000 Encounter for general adult medical examination without abnormal findings: Secondary | ICD-10-CM | POA: Diagnosis not present

## 2024-05-16 DIAGNOSIS — Z23 Encounter for immunization: Secondary | ICD-10-CM

## 2024-05-16 DIAGNOSIS — E1142 Type 2 diabetes mellitus with diabetic polyneuropathy: Secondary | ICD-10-CM

## 2024-05-16 DIAGNOSIS — I10 Essential (primary) hypertension: Secondary | ICD-10-CM | POA: Diagnosis not present

## 2024-05-16 MED ORDER — CHLORTHALIDONE 25 MG PO TABS
25.0000 mg | ORAL_TABLET | Freq: Every day | ORAL | 3 refills | Status: AC
Start: 2024-05-16 — End: ?

## 2024-05-16 NOTE — Patient Instructions (Addendum)
 It was so good to see you today! Thank you for allowing me to take care of you.  Today we discussed the following concerns and plans:  Blood Pressure and Diabetes Keep taking all of your medications as prescribed.  I recommend you get your Shingles vaccine. You can get this at your pharmacy. Please also ask them about getting your RSV vaccine. We gave you your flu shot today in our clinic.  I have referred you to ophthalmology and podiatry - these offices should call you to make appointments.  If you have any concerns, please call the clinic or schedule an appointment.  It was a pleasure to take care of you today. Be well!  Lauraine Norse, DO [facility], PGY-2  Do you need your medications delivered to your home?   We'll send your prescription to the [facility] for delivery.          Address: [address] [place], [place], [place] [address]          Phone: [phone]  Please call the [facility] to speak with a pharmacist and set up your home medication delivery. If you have any questions, feel free to contact us  -- we're happy to help!  Other [facility] that offer affordable prices on both prescriptions and over-the-counter items, as well as convenient services like vaccinations, are  [facility], at [facility]         Address:  [address], [place], [place] [address]         Phone: [phone]  [facility], located in [facility]        Address: [address] [place], [place] [address]        Phone: [phone]  [facility], at [facility]       Address: [address], [place], [place] [address]       Phone: [phone]  [facility], at [facility]       Address: [address], [address], [place], [place] [address]       Phone: [phone]

## 2024-05-16 NOTE — Assessment & Plan Note (Signed)
-   Flu vaccine given today - she will get Shingles and RSV vaccines from her pharmacy - referrals placed to ophthalmology and podiatry

## 2024-05-16 NOTE — Progress Notes (Signed)
    SUBJECTIVE:   CHIEF COMPLAINT / HPI:   T2DM w HTN She is taking Losartan  100 mg daily, spironolactone  25 mg daily, chlorthalidone  25 mg daily. No missed doses. No HA, dizziness, or any other concerns. She does check her pressure at home and states it is good.  Neuropathy Well-controlled with pregabalin  25 mg twice a day.  Need for referrals She has seen eye doctor but has not been to an ophthalmologist since hers retired. She also requests to see   PERTINENT  PMH / PSH: Reviewed. HTN T2DM COPD  OBJECTIVE:   BP 133/77   Pulse 87   Ht 5' (1.524 m)   Wt 191 lb 9.6 oz (86.9 kg)   SpO2 100%   BMI 37.42 kg/m   General: well-appearing, no acute distress. HEENT: normocephalic, PERRLA, EOM grossly intact. Cardio: Regular rate, regular rhythm, no murmurs on exam. Pulm: Clear, no wheezing, no crackles. No increased work of breathing. Extremities: no peripheral edema. Moves all extremities equally. Neuro: Alert and oriented x3, speech normal in content, no facial asymmetry. Psych:  Cognition and judgment appear intact. Alert, communicative, and cooperative.   ASSESSMENT/PLAN:   Assessment & Plan Essential hypertension Controlled. - continue losartan  100 mg daily, spironolactone  25 mg daily, chlorthalidone  25 mg daily Diabetic polyneuropathy associated with type 2 diabetes mellitus (HCC) She is doing well on current regimen. No changes. Foot exam completed today. - continue pregabalin  25 mg BID Healthcare maintenance - Flu vaccine given today - she will get Shingles and RSV vaccines from her pharmacy - referrals placed to ophthalmology and podiatry    Lauraine Norse, DO [facility]

## 2024-05-18 ENCOUNTER — Encounter: Payer: Self-pay | Admitting: Physical Therapy

## 2024-05-18 ENCOUNTER — Ambulatory Visit: Attending: Physician Assistant | Admitting: Physical Therapy

## 2024-05-18 DIAGNOSIS — M5459 Other low back pain: Secondary | ICD-10-CM | POA: Insufficient documentation

## 2024-05-18 DIAGNOSIS — M6281 Muscle weakness (generalized): Secondary | ICD-10-CM | POA: Insufficient documentation

## 2024-05-18 NOTE — Therapy (Signed)
 OUTPATIENT PHYSICAL THERAPY THORACOLUMBAR EVALUATION   Patient Name: Alexandra Campos MRN: [id number] DOB:1956/06/13, 68 y.o., female Today's Date: 05/18/2024  END OF SESSION:  PT End of Session - 05/18/24 0909     Visit Number 3    Number of Visits 7    Date for PT Re-Evaluation 06/12/24    PT Start Time 0915    PT Stop Time 0955    PT Time Calculation (min) 40 min    Activity Tolerance Patient tolerated treatment well    Behavior During Therapy [facility] for tasks assessed/performed           Past Medical History:  Diagnosis Date   Abnormal ankle brachial index (ABI) 01/14/2020   Abnormal facial hair 11/24/2019   Arthritis    Chronic kidney disease    Complex cyst of right ovary 04/09/2020   Likely a dermoid. Normal CA 125     COPD (chronic obstructive pulmonary disease) (HCC)    Diabetes mellitus without complication (HCC)    GERD (gastroesophageal reflux disease)    Hyperlipidemia    Hypertension    Ovarian cyst 03/09/2020   CA-125 17.3     Peripheral vascular disease (HCC)    Postmenopausal bleeding 02/07/2020   Rash 04/20/2022   Right upper quadrant pain 01/21/2022   Stress incontinence of urine 10/03/2019   Substance abuse (HCC)    ETOH, THC   Wheezing 12/13/2020   Yeast infection involving the vagina and surrounding area 06/10/2020   Past Surgical History:  Procedure Laterality Date   ABDOMINAL AORTOGRAM W/LOWER EXTREMITY N/A 03/15/2020   Procedure: ABDOMINAL AORTOGRAM W/LOWER EXTREMITY;  Surgeon: Eliza Lonni RAMAN, MD;  Location: [facility]VASIVE CV [facility];  Service: Cardiovascular;  Laterality: N/A;   ANKLE SURGERY Right    ORIF   CARPAL TUNNEL RELEASE Right 11/18/2022   Procedure: RIGHT CARPAL TUNNEL RELEASE;  Surgeon: Jerri Kay HERO, MD;  Location: [facility];  Service: Orthopedics;  Laterality: Right;   DILATATION & CURETTAGE/HYSTEROSCOPY WITH MYOSURE N/A 08/31/2023   Procedure: DILATATION & CURETTAGE/HYSTEROSCOPY WITH MYOSURE;  Surgeon: Eldonna Mays, MD;  Location: [facility];  Service: Gynecology;  Laterality: N/A;   ROBOTIC ASSISTED BILATERAL SALPINGO OOPHERECTOMY Bilateral 08/31/2023   Procedure: XI ROBOTIC ASSISTED BILATERAL SALPINGO OOPHORECTOMY;  Surgeon: Eldonna Mays, MD;  Location: [facility];  Service: Gynecology;  Laterality: Bilateral;   THROAT SURGERY     Reports polyps removed from throat, done at [facility] approx 15 years ago   TONSILLECTOMY     TUBAL LIGATION Bilateral    WISDOM TOOTH EXTRACTION     Patient Active Problem List   Diagnosis Date Noted   Acne vulgaris 03/09/2024   Glossodynia 02/03/2024   Leg swelling 09/24/2023   Tobacco use 09/24/2023   Adnexal mass 08/31/2023   Pre-op evaluation 08/16/2023   COPD with acute exacerbation (HCC) 08/03/2023   Obstructive lung disease (HCC) 07/09/2022   Low back pain 09/22/2020   Atherosclerosis of artery of extremity with rest pain (HCC) 03/15/2020   Ovarian cyst 03/09/2020   Postmenopausal bleeding 02/07/2020   Type 2 DM with CKD stage 3 and hypertension (HCC) 07/03/2019   Class 3 severe obesity with body mass index (BMI) of 40.0 to 44.9 in adult 06/16/2019   CKD (chronic kidney disease) stage 3, GFR 30-59 ml/min (HCC) 06/16/2019   Healthcare maintenance 06/16/2019   Carpal tunnel syndrome on right 06/16/2019   Hypertension 08/04/2018   Tobacco dependence 08/04/2018    PCP: Lafe Domino, DO  REFERRING PROVIDER: Jule,  Ronal CROME, PA-C  REFERRING DIAG: [phone] (ICD-10-CM) - Chronic midline low back pain with left-sided sciatica   Rationale for Evaluation and Treatment: Rehabilitation  THERAPY DIAG:  Other low back pain  Muscle weakness (generalized)  PERTINENT HISTORY: HTN, DM2, COPD, CKD3,  WEIGHT BEARING RESTRICTIONS: No  FALLS:  Has patient fallen in last 6 months? No  LIVING ENVIRONMENT: Lives with: lives alone Lives in: House/apartment Stairs: Yes: Internal: 12 steps; uses elevator Has following equipment at home: Single point cane  and Walker - 4 wheeled  OCCUPATION: not currently working   PRECAUTIONS: None ---------------------------------------------------------------------------------------------  SUBJECTIVE:                                                                                                                                                                                           SUBJECTIVE STATEMENT: Pt attended today's session with reports of 5/10 pain. Pt stated that they have maintained poor compliance with current HEP.  Has been busy taking care of banking mishaps and dr appointments throughout the week.   Pt attended today's session with reports of 3/10 pain. Pt stated that they have maintained good compliance with current HEP.  Getting an exercise mat this weekend so she can perform HEP on the floor, more  comfortably   Eval statement 05/01/2024: LBP that has gotten worse in the past 3 weeks. Not sure if it is related to a blocked artery in the hip and groin. Ortho told her it was not related and was sent here for treatment.  Walking and standing for ADLs increases pain. 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   Pain currently 5/10, has n/t in B feet at night in the toes, may be related to DM2. Pt isnt sure if the n/t was present prior to back pain.  RED FLAGS: None    PLOF: Independent  PATIENT GOALS: reduce pain  NEXT MD VISIT: September 2nd with PCP , ortho is 6week follow up ---------------------------------------------------------------------------------------------  OBJECTIVE:  Note: Objective measures were completed at Evaluation unless otherwise noted.  DIAGNOSTIC FINDINGS:  X-rays demonstrate advanced multilevel degenerative changes.  She also has  moderate degenerative changes to both hip joints.   PATIENT SURVEYS:  MODI:31/50 (62%)  COGNITION: Overall cognitive status: Within functional limits for tasks  assessed   PALPATION: Tenderness to sacral sulcus and diffuse lumbar spinous processes  Lumbar contraction pattern  L Multifidus: proper pattern, weak contraction  R Multifidus: proper pattern, weak contraction   SENSATION: Light touch: Impaired   MUSCLE LENGTH: Hamstrings: Right 15; deg; Left 15 deg  POSTURE: rounded shoulders,  forward head, and increased lumbar lordosis   LUMBAR ROM:   AROM eval  Flexion 80%!  Extension 100%  Right lateral flexion 70%  Left lateral flexion 70%  Right rotation 40%  Left rotation 40%   (Blank rows = not tested)  ! Indicates pain with testing  LOWER EXTREMITY ROM:     Active  Right eval Left eval  Hip flexion    Hip extension    Hip abduction    Hip adduction    Hip internal rotation    Hip external rotation    Knee flexion    Knee extension    Ankle dorsiflexion    Ankle plantarflexion    Ankle inversion    Ankle eversion     (Blank rows = not tested)  ! Indicates pain with testing  LOWER EXTREMITY MMT:    MMT Right eval Left eval  Hip flexion 4+ 4-  Hip extension 4- 4-  Hip abduction 4- 4-  Hip adduction    Hip internal rotation    Hip external rotation    Knee flexion 4+ 4  Knee extension 5 5  Ankle dorsiflexion    Ankle plantarflexion    Ankle inversion    Ankle eversion     (Blank rows = not tested)   ! Indicates pain with testing LUMBAR SPECIAL TESTS:  Prone instability test: Positive  FUNCTIONAL TESTS:  30 seconds chair stand test 8  GAIT: Distance walked: 145ft Assistive device utilized: Single point cane Level of assistance: Modified independence Comments: decreased step length TREATMENT:  [facility] Adult PT Treatment:                                                DATE: 05/18/2024  Therapeutic Exercise: Supine QL stretch 2x1s B EOB hip flexor stretch 2x1'B Supine Lumbar rotation on Pball 2x12 B Therapeutic Activity: NuStep 8' for activity tolerance Supine marching w/PPT 2x12, hold  2s Supine bridge on pball w/ PPT 2x12, hold 3s     [facility] Adult PT Treatment:                                                DATE: 05/10/2024 Neuromuscular re-ed: Supine bridge on Pball 2x12, hold 3s Pball roll up 2x15, hold 2s Hamstring seated stretch w/ apt 2x1' Therapeutic Activity: NuStep 8' for activity tolerance Supine QL stretch 4x30s B Supine marching w/PPT 2x12, hold 2s PPT practice    [facility] Adult PT Treatment:                                                DATE: 05/01/2024 Self Care: Pt education POC discussion  PATIENT EDUCATION:  Education details: Pt received education regarding HEP performance, ADL performance, functional activity tolerance, impairment education, appropriate performance of therapeutic activities.  Person educated: Patient Education method: Explanation, Demonstration, Tactile cues, Verbal cues, and Handouts Education comprehension: verbalized understanding and returned demonstration  HOME EXERCISE PROGRAM: Access Code: [email or web address] URL: [email or web address] Date: 05/01/2024 Prepared by: Mabel Kiang  Exercises - Supine Posterior Pelvic Tilt  - 1 x daily - 7 x weekly - 2-3 sets - 15 reps - Supine 90/90 Alternating Heel Touches with Posterior Pelvic Tilt  - 1 x daily - 4 x weekly - 2-3 sets - 12 reps - Supine Bridge  - 1 x daily - 4 x weekly - 2-3 sets - 12 reps - 4s hold - Hooklying Single Leg Bent Knee Fallouts with Resistance  - 1 x daily - 4 x weekly - 2-3 sets - 12 reps - 2s hold ---------------------------------------------------------------------------------------------  ASSESSMENT:  CLINICAL IMPRESSION: Pt attended physical therapy session for continuation of treatment regarding LBP. Today's treatment focused on improvement of  proximal hip strength/motility, posterior chain motility, and BLE  mobility. Pt attended with reports of poor HEP compliance and increased pain d/t stress from the week, current 5/10. Pt showed great tolerance to administered treatment with no adverse effects by the end of session. Skilled intervention was utilized via activity modification for pt tolerance with task completion, functional progression/regression promoting best outcomes inline with current rehab goals, as well as minimal verbal/tactile cuing alongside no physical assistance for safe and appropriate performance of today's activities. Continue to progress as tolerated within current POC focus.   Eval impression (05/01/2024): Pt. attended today's physical therapy session for evaluation of LBP. Pt has complaints of 5/10 LBP onset 3 weeks that worsens with prolonged walking or standing. Pt has notable deficits and would benefit from therapeutic focus on lumbar stability, lumbar AROM, transfer quality, proximal hip strengthening, dynamic core strength/activation, and activity tolerance. Treatment performed today focused on pt education detailed in the objective. Pt demonstrated great understanding of education provided. required minimal v/t cues and no pysical assistance for appropriate performance with today's activities. Pt requires the intervention of skilled outpatient physical therapy to address the aforementioned deficits and progress towards a functional level in line with therapeutic goals.    OBJECTIVE IMPAIRMENTS: Abnormal gait, decreased activity tolerance, decreased mobility, difficulty walking, decreased ROM, decreased strength, hypomobility, improper body mechanics, postural dysfunction, and pain.   ACTIVITY LIMITATIONS: carrying, lifting, standing, squatting, and locomotion level  PARTICIPATION LIMITATIONS: meal prep, cleaning, laundry, and community activity  PERSONAL FACTORS: Fitness, Past/current experiences, and 3+ comorbidities: see pmh are also affecting patient's functional outcome.    REHAB POTENTIAL: Fair complex PMH  CLINICAL DECISION MAKING: Stable/uncomplicated  EVALUATION COMPLEXITY: Low   GOALS: Goals reviewed with patient? YES  SHORT TERM GOALS: Target date: 05/22/2024  Pt will be independent with administered HEP to demonstrate the competency necessary for long term managemnet of symptoms at home. Baseline: Goal status: INITIAL  LONG TERM GOALS: Target date: 06/12/2024  Pt. Will achieve a MODI score of 25/50 (50%) as to demonstrate improvement in self-perceived functional ability with daily activities.  Baseline:  Goal status: INITIAL  2.  Pt will improve Global hip strength to a 4+/5 to demonstrate improvement in strength for quality of motion and activity performance.  Baseline:  Goal status: INITIAL  3.  Pt will improve Lumbar AROM to 90% of standardized norms with less than 3/10 pain to demonstrate necessary mobility for high quality and safe ADLs  Baseline:  Goal status: INITIAL  4.  Pt will report the ability to stand for >/= 15 minutes as to demonstrate improved tolerance to standing for prolonged time and improved ability to participate in ADLs.  Baseline:10 minutes  Goal status: INITIAL  5. Pt will improve 30s STS to at least 11 to demonstrate improved transfer quality, BLE strength, coordination, and BLE endurance necessary for higher activity tolerance in the community. Baseline: 8 Goal status: INITIAL ---------------------------------------------------------------------------------------------  PLAN:  PT FREQUENCY: 1-2x/week  PT DURATION: 6 weeks  PLANNED INTERVENTIONS: 97110-Therapeutic exercises, 97530- Therapeutic activity, [id number]- Neuromuscular re-education, 97535- Self Care, [id number]- Manual therapy, [phone]- Gait training, Patient/Family education, Balance training, Stair training, Joint mobilization, Spinal mobilization, and Moist heat.  PLAN FOR NEXT SESSION: progress within current POC as tolerated.   Mabel Kiang, PT,  DPT 05/18/2024, 9:55 AM   Referring diagnosis?  Chronic midline low back pain with left-sided sciatica [M54.42, G89.29]  Treatment diagnosis? (if different than referring diagnosis)  back pain, other. What was this (referring dx) caused by? []  Surgery []  Fall [x]  Ongoing issue [x]  Arthritis []  Other: ____________  Laterality: []  Rt []  Lt [x]  Both  Check all possible CPT codes:    97110-Therapeutic exercises, 97530- Therapeutic activity, 97112- Neuromuscular re-education, 97535- Self Care, 02859- Manual therapy, [phone]- Gait training, Patient/Family education, Balance training, Stair training, Joint mobilization, Spinal mobilization, and Moist heat.

## 2024-05-25 ENCOUNTER — Ambulatory Visit: Admitting: Physical Therapy

## 2024-06-01 ENCOUNTER — Other Ambulatory Visit: Payer: Self-pay | Admitting: Family Medicine

## 2024-06-01 ENCOUNTER — Ambulatory Visit: Admitting: Physical Therapy

## 2024-06-01 ENCOUNTER — Other Ambulatory Visit: Payer: Self-pay

## 2024-06-01 DIAGNOSIS — M5459 Other low back pain: Secondary | ICD-10-CM

## 2024-06-01 DIAGNOSIS — M6281 Muscle weakness (generalized): Secondary | ICD-10-CM | POA: Diagnosis not present

## 2024-06-01 DIAGNOSIS — N183 Chronic kidney disease, stage 3 unspecified: Secondary | ICD-10-CM

## 2024-06-01 MED ORDER — NYSTATIN 100000 UNIT/GM EX CREA: TOPICAL_CREAM | Freq: Two times a day (BID) | CUTANEOUS | 0 refills | Status: DC

## 2024-06-01 NOTE — Therapy (Signed)
 OUTPATIENT PHYSICAL THERAPY THORACOLUMBAR EVALUATION   Patient Name: Alexandra Campos MRN: [id number] DOB:11/04/1955, 68 y.o., female Today's Date: 06/01/2024  END OF SESSION:     Past Medical History:  Diagnosis Date   Abnormal ankle brachial index (ABI) 01/14/2020   Abnormal facial hair 11/24/2019   Arthritis    Chronic kidney disease    Complex cyst of right ovary 04/09/2020   Likely a dermoid. Normal CA 125     COPD (chronic obstructive pulmonary disease) (HCC)    Diabetes mellitus without complication (HCC)    GERD (gastroesophageal reflux disease)    Hyperlipidemia    Hypertension    Ovarian cyst 03/09/2020   CA-125 17.3     Peripheral vascular disease (HCC)    Postmenopausal bleeding 02/07/2020   Rash 04/20/2022   Right upper quadrant pain 01/21/2022   Stress incontinence of urine 10/03/2019   Substance abuse (HCC)    ETOH, THC   Wheezing 12/13/2020   Yeast infection involving the vagina and surrounding area 06/10/2020   Past Surgical History:  Procedure Laterality Date   ABDOMINAL AORTOGRAM W/LOWER EXTREMITY N/A 03/15/2020   Procedure: ABDOMINAL AORTOGRAM W/LOWER EXTREMITY;  Surgeon: Eliza Lonni RAMAN, MD;  Location: [facility] INVASIVE CV LAB;  Service: Cardiovascular;  Laterality: N/A;   ANKLE SURGERY Right    ORIF   CARPAL TUNNEL RELEASE Right 11/18/2022   Procedure: RIGHT CARPAL TUNNEL RELEASE;  Surgeon: Jerri Kay HERO, MD;  Location: [facility];  Service: Orthopedics;  Laterality: Right;   DILATATION & CURETTAGE/HYSTEROSCOPY WITH MYOSURE N/A 08/31/2023   Procedure: DILATATION & CURETTAGE/HYSTEROSCOPY WITH MYOSURE;  Surgeon: Eldonna Mays, MD;  Location: [facility];  Service: Gynecology;  Laterality: N/A;   ROBOTIC ASSISTED BILATERAL SALPINGO OOPHERECTOMY Bilateral 08/31/2023   Procedure: XI ROBOTIC ASSISTED BILATERAL SALPINGO OOPHORECTOMY;  Surgeon: Eldonna Mays, MD;  Location: [facility];  Service: Gynecology;  Laterality: Bilateral;   THROAT  SURGERY     Reports polyps removed from throat, done at [facility] approx 15 years ago   TONSILLECTOMY     TUBAL LIGATION Bilateral    WISDOM TOOTH EXTRACTION     Patient Active Problem List   Diagnosis Date Noted   Acne vulgaris 03/09/2024   Glossodynia 02/03/2024   Leg swelling 09/24/2023   Tobacco use 09/24/2023   Adnexal mass 08/31/2023   Pre-op evaluation 08/16/2023   COPD with acute exacerbation (HCC) 08/03/2023   Obstructive lung disease (HCC) 07/09/2022   Low back pain 09/22/2020   Atherosclerosis of artery of extremity with rest pain (HCC) 03/15/2020   Ovarian cyst 03/09/2020   Postmenopausal bleeding 02/07/2020   Type 2 DM with CKD stage 3 and hypertension (HCC) 07/03/2019   Class 3 severe obesity with body mass index (BMI) of 40.0 to 44.9 in adult 06/16/2019   CKD (chronic kidney disease) stage 3, GFR 30-59 ml/min (HCC) 06/16/2019   Healthcare maintenance 06/16/2019   Carpal tunnel syndrome on right 06/16/2019   Hypertension 08/04/2018   Tobacco dependence 08/04/2018    PCP: Lafe Domino, DO  REFERRING PROVIDER: Jule Ronal CROME, PA-C  REFERRING DIAG: [phone] (ICD-10-CM) - Chronic midline low back pain with left-sided sciatica   Rationale for Evaluation and Treatment: Rehabilitation  THERAPY DIAG:  No diagnosis found.  PERTINENT HISTORY: HTN, DM2, COPD, CKD3,  WEIGHT BEARING RESTRICTIONS: No  FALLS:  Has patient fallen in last 6 months? No  LIVING ENVIRONMENT: Lives with: lives alone Lives in: House/apartment Stairs: Yes: Internal: 12 steps; uses elevator Has following equipment at home: Single  point cane and Walker - 4 wheeled  OCCUPATION: not currently working   PRECAUTIONS: None ---------------------------------------------------------------------------------------------  SUBJECTIVE:                                                                                                                                                                                            SUBJECTIVE STATEMENT: Pt attended today's session with reports of minimal pain at rest. Pt stated that they have maintained good compliance with current HEP.  Feels therapy has been very helpful with mobility and some pain relief, is able to start doing some day to day activities in line with PLOF, however pain levels are not consistent.  Eval statement 05/01/2024: LBP that has gotten worse in the past 3 weeks. Not sure if it is related to a blocked artery in the hip and groin. Ortho told her it was not related and was sent here for treatment.  Walking and standing for ADLs increases pain. 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   Pain currently 5/10, has n/t in B feet at night in the toes, may be related to DM2. Pt isnt sure if the n/t was present prior to back pain.  RED FLAGS: None    PLOF: Independent  PATIENT GOALS: reduce pain  NEXT MD VISIT: September 2nd with PCP , ortho is 6week follow up ---------------------------------------------------------------------------------------------  OBJECTIVE:  Note: Objective measures were completed at Evaluation unless otherwise noted.  DIAGNOSTIC FINDINGS:  X-rays demonstrate advanced multilevel degenerative changes.  She also has  moderate degenerative changes to both hip joints.   PATIENT SURVEYS:  MODI:31/50 (62%)  COGNITION: Overall cognitive status: Within functional limits for tasks assessed   PALPATION: Tenderness to sacral sulcus and diffuse lumbar spinous processes  Lumbar contraction pattern  L Multifidus: proper pattern, weak contraction  R Multifidus: proper pattern, weak contraction   SENSATION: Light touch: Impaired   MUSCLE LENGTH: Hamstrings: Right 15; deg; Left 15 deg  POSTURE: rounded shoulders, forward head, and increased lumbar lordosis   LUMBAR ROM:   AROM eval 06/01/2024  Flexion 80%! 100%  Extension 100% 100%  Right lateral  flexion 70% 90%  Left lateral flexion 70% 90%  Right rotation 40% 90%  Left rotation 40% 90%   (Blank rows = not tested)  ! Indicates pain with testing  LOWER EXTREMITY ROM:     Active  Right eval Left eval  Hip flexion    Hip extension    Hip abduction    Hip adduction    Hip internal rotation    Hip external rotation  Knee flexion    Knee extension    Ankle dorsiflexion    Ankle plantarflexion    Ankle inversion    Ankle eversion     (Blank rows = not tested)  ! Indicates pain with testing  LOWER EXTREMITY MMT:    MMT Right eval 06/01/2024 Left eval 06/01/2024  Hip flexion 4+ 4+ 4- 4-  Hip extension 4- 5 4- 5  Hip abduction 4- 4+ 4- 4+  Hip adduction      Hip internal rotation      Hip external rotation      Knee flexion 4+ 4+ 4 4+  Knee extension 5 5 5 5   Ankle dorsiflexion      Ankle plantarflexion      Ankle inversion      Ankle eversion       (Blank rows = not tested)   ! Indicates pain with testing LUMBAR SPECIAL TESTS:  Prone instability test: Positive  FUNCTIONAL TESTS:  30 seconds chair stand test 8  GAIT: Distance walked: 172ft Assistive device utilized: Single point cane Level of assistance: Modified independence Comments: decreased step length TREATMENT:  [facility] Adult PT Treatment:                                                DATE: 06/01/2024  Therapeutic Activity: Objective measures Goal assessment, functional testing Self Care: Pt education POC discussion   [facility] Adult PT Treatment:                                                DATE: 05/18/2024  Therapeutic Exercise: Supine QL stretch 2x1s B EOB hip flexor stretch 2x1'B Supine Lumbar rotation on Pball 2x12 B Therapeutic Activity: NuStep 8' for activity tolerance Supine marching w/PPT 2x12, hold 2s Supine bridge on pball w/ PPT 2x12, hold 3s     [facility] Adult PT Treatment:                                                DATE: 05/10/2024 Neuromuscular re-ed: Supine bridge  on Pball 2x12, hold 3s Pball roll up 2x15, hold 2s Hamstring seated stretch w/ apt 2x1' Therapeutic Activity: NuStep 8' for activity tolerance Supine QL stretch 4x30s B Supine marching w/PPT 2x12, hold 2s PPT practice    [facility] Adult PT Treatment:                                                DATE: 05/01/2024 Self Care: Pt education POC discussion  PATIENT EDUCATION:  Education details: Pt received education regarding HEP performance, ADL performance, functional activity tolerance, impairment education, appropriate performance of therapeutic activities.  Person educated: Patient Education method: Explanation, Demonstration, Tactile cues, Verbal cues, and Handouts Education comprehension: verbalized understanding and returned demonstration  HOME EXERCISE PROGRAM: Access Code: [email or web address] URL: [email or web address] Date: 05/01/2024 Prepared by: Mabel Kiang  Exercises - Supine Posterior Pelvic Tilt  - 1 x daily - 7 x weekly - 2-3 sets - 15 reps - Supine 90/90 Alternating Heel Touches with Posterior Pelvic Tilt  - 1 x daily - 4 x weekly - 2-3 sets - 12 reps - Supine Bridge  - 1 x daily - 4 x weekly - 2-3 sets - 12 reps - 4s hold - Hooklying Single Leg Bent Knee Fallouts with Resistance  - 1 x daily - 4 x weekly - 2-3 sets - 12 reps - 2s hold ---------------------------------------------------------------------------------------------  ASSESSMENT:  CLINICAL IMPRESSION: Pt attended physical therapy session for re-evaluation of LBP. Pt has met 4 goals and continues to work towards 2 others. Difficulties continue with pain levels during ambulation, standing tolerance, and hip flexion strength. Pt required minimal v/t cuing as well as no assistance for safe and appropriate performance of today's activities. Continue with therapeutic focus on  global hip strenghtneing stbaility, lumbar stability, dynamic core strenghtening, and posterior chain motility, using modalities and/or STM as indicated for pain relief. Education was given to continue applying ADL education from previous sessions as well as performing HEP as prescribed with freedom to progress as tolerated using previous education on modification and exercise dosage. Pt has displayed and verbalized competence regarding this education.  Pt  continues to require the intervention of skilled outpatient physical therapy to address the aforementioned deficits and progress towards a functional level in line with therapeutic goals.   Eval impression (05/01/2024): Pt. attended today's physical therapy session for evaluation of LBP. Pt has complaints of 5/10 LBP onset 3 weeks that worsens with prolonged walking or standing. Pt has notable deficits and would benefit from therapeutic focus on lumbar stability, lumbar AROM, transfer quality, proximal hip strengthening, dynamic core strength/activation, and activity tolerance. Treatment performed today focused on pt education detailed in the objective. Pt demonstrated great understanding of education provided. required minimal v/t cues and no pysical assistance for appropriate performance with today's activities. Pt requires the intervention of skilled outpatient physical therapy to address the aforementioned deficits and progress towards a functional level in line with therapeutic goals.    OBJECTIVE IMPAIRMENTS: Abnormal gait, decreased activity tolerance, decreased mobility, difficulty walking, decreased ROM, decreased strength, hypomobility, improper body mechanics, postural dysfunction, and pain.   ACTIVITY LIMITATIONS: carrying, lifting, standing, squatting, and locomotion level  PARTICIPATION LIMITATIONS: meal prep, cleaning, laundry, and community activity  PERSONAL FACTORS: Fitness, Past/current experiences, and 3+ comorbidities: see pmh  are also affecting patient's functional outcome.   REHAB POTENTIAL: Fair complex PMH  CLINICAL DECISION MAKING: Stable/uncomplicated  EVALUATION COMPLEXITY: Low   GOALS: Goals reviewed with patient? YES  SHORT TERM GOALS: Target date: 05/22/2024  Pt will be independent with administered HEP to demonstrate the competency necessary for long term managemnet of symptoms at home. Baseline: Goal status:MET 06/01/2024  LONG TERM GOALS: Target date: 06/12/2024  Pt. Will achieve a MODI score of 25/50 (50%) as to demonstrate improvement in self-perceived functional ability with daily activities.  Baseline: 22/50 (44%) Goal status: MET 06/01/2024  2.  Pt will improve Global hip strength to a 4+/5 to demonstrate improvement in strength for quality  of motion and activity performance. Baseline:  see objective chart Goal status: ONGOING 06/01/2024  3.  Pt will improve Lumbar AROM to 90% of standardized norms with less than 3/10 pain to demonstrate necessary mobility for high quality and safe ADLs  Baseline:  Goal status: MET 06/01/2024   4.  Pt will report the ability to stand for >/= 15 minutes as to demonstrate improved tolerance to standing for prolonged time and improved ability to participate in ADLs.  Baseline:10 minutes  Goal status: ONGOING ( cannot stand still for 15, can walk if needed)  5. Pt will improve 30s STS to at least 11 to demonstrate improved transfer quality, BLE strength, coordination, and BLE endurance necessary for higher activity tolerance in the community. Baseline: 8 06/01/2024;13 Goal status: MET 06/01/2024 ---------------------------------------------------------------------------------------------  PLAN:  PT FREQUENCY: 2 visits   PT DURATION:4 weeks  PLANNED INTERVENTIONS: 97110-Therapeutic exercises, 97530- Therapeutic activity, 97112- Neuromuscular re-education, 97535- Self Care, 02859- Manual therapy, [phone]- Gait training, Patient/Family education, Balance  training, Stair training, Joint mobilization, Spinal mobilization, and Moist heat.  PLAN FOR NEXT SESSION: progress within current POC as tolerated.   Mabel Kiang, PT, DPT 06/01/2024, 9:20 AM   Referring diagnosis?  Chronic midline low back pain with left-sided sciatica [M54.42, G89.29]  Treatment diagnosis? (if different than referring diagnosis)  back pain, other. What was this (referring dx) caused by? []  Surgery []  Fall [x]  Ongoing issue [x]  Arthritis []  Other: ____________  Laterality: []  Rt []  Lt [x]  Both  Check all possible CPT codes:    97110-Therapeutic exercises, 97530- Therapeutic activity, [id number]- Neuromuscular re-education, 97535- Self Care, 02859- Manual therapy, [phone]- Gait training, Patient/Family education, Balance training, Stair training, Joint mobilization, Spinal mobilization, and Moist heat.

## 2024-06-03 ENCOUNTER — Other Ambulatory Visit: Payer: Self-pay | Admitting: Physician Assistant

## 2024-06-05 ENCOUNTER — Encounter: Payer: Self-pay | Admitting: Physical Therapy

## 2024-06-05 ENCOUNTER — Telehealth: Payer: Self-pay

## 2024-06-05 NOTE — Telephone Encounter (Signed)
 Patient calls nurse line in regards to Podiatry referral.   She reports the latest podiatrist she was referred to does not accept [facility].   I am assuming she means [facility]. However, the patient was not sure.   She reports if no where will take her insurance, then she is ok with going back to [facility].   Advised will forward to referral coordinator.

## 2024-06-14 ENCOUNTER — Ambulatory Visit: Payer: Self-pay | Attending: Physician Assistant | Admitting: Physical Therapy

## 2024-06-14 ENCOUNTER — Encounter: Payer: Self-pay | Admitting: Physical Therapy

## 2024-06-14 DIAGNOSIS — M5459 Other low back pain: Secondary | ICD-10-CM | POA: Insufficient documentation

## 2024-06-14 DIAGNOSIS — M6281 Muscle weakness (generalized): Secondary | ICD-10-CM | POA: Insufficient documentation

## 2024-06-14 NOTE — Therapy (Signed)
 OUTPATIENT PHYSICAL THERAPY THORACOLUMBAR EVALUATION   Patient Name: Alexandra Campos MRN: [id number] DOB:10-04-1955, 68 y.o., female Today's Date: 06/14/2024  END OF SESSION:  PT End of Session - 06/14/24 1207     Visit Number 5    Number of Visits 12    Date for Recertification  06/29/24    PT Start Time 1130    PT Stop Time 1208    PT Time Calculation (min) 38 min    Activity Tolerance Patient tolerated treatment well    Behavior During Therapy [facility] for tasks assessed/performed            Past Medical History:  Diagnosis Date   Abnormal ankle brachial index (ABI) 01/14/2020   Abnormal facial hair 11/24/2019   Arthritis    Chronic kidney disease    Complex cyst of right ovary 04/09/2020   Likely a dermoid. Normal CA 125     COPD (chronic obstructive pulmonary disease) (HCC)    Diabetes mellitus without complication (HCC)    GERD (gastroesophageal reflux disease)    Hyperlipidemia    Hypertension    Ovarian cyst 03/09/2020   CA-125 17.3     Peripheral vascular disease    Postmenopausal bleeding 02/07/2020   Rash 04/20/2022   Right upper quadrant pain 01/21/2022   Stress incontinence of urine 10/03/2019   Substance abuse (HCC)    ETOH, THC   Wheezing 12/13/2020   Yeast infection involving the vagina and surrounding area 06/10/2020   Past Surgical History:  Procedure Laterality Date   ABDOMINAL AORTOGRAM W/LOWER EXTREMITY N/A 03/15/2020   Procedure: ABDOMINAL AORTOGRAM W/LOWER EXTREMITY;  Surgeon: Eliza Lonni RAMAN, MD;  Location: [facility];  Service: Cardiovascular;  Laterality: N/A;   ANKLE SURGERY Right    ORIF   CARPAL TUNNEL RELEASE Right 11/18/2022   Procedure: RIGHT CARPAL TUNNEL RELEASE;  Surgeon: Jerri Kay HERO, MD;  Location: [facility];  Service: Orthopedics;  Laterality: Right;   DILATATION & CURETTAGE/HYSTEROSCOPY WITH MYOSURE N/A 08/31/2023   Procedure: DILATATION & CURETTAGE/HYSTEROSCOPY WITH MYOSURE;  Surgeon: Eldonna Mays, MD;  Location: [facility];  Service: Gynecology;  Laterality: N/A;   ROBOTIC ASSISTED BILATERAL SALPINGO OOPHERECTOMY Bilateral 08/31/2023   Procedure: XI ROBOTIC ASSISTED BILATERAL SALPINGO OOPHORECTOMY;  Surgeon: Eldonna Mays, MD;  Location: [facility];  Service: Gynecology;  Laterality: Bilateral;   THROAT SURGERY     Reports polyps removed from throat, done at [facility] approx 15 years ago   TONSILLECTOMY     TUBAL LIGATION Bilateral    WISDOM TOOTH EXTRACTION     Patient Active Problem List   Diagnosis Date Noted   Acne vulgaris 03/09/2024   Glossodynia 02/03/2024   Leg swelling 09/24/2023   Tobacco use 09/24/2023   Adnexal mass 08/31/2023   Pre-op evaluation 08/16/2023   COPD with acute exacerbation (HCC) 08/03/2023   Obstructive lung disease (HCC) 07/09/2022   Low back pain 09/22/2020   Atherosclerosis of artery of extremity with rest pain (HCC) 03/15/2020   Ovarian cyst 03/09/2020   Postmenopausal bleeding 02/07/2020   Type 2 DM with CKD stage 3 and hypertension (HCC) 07/03/2019   Class 3 severe obesity with body mass index (BMI) of 40.0 to 44.9 in adult (HCC) 06/16/2019   CKD (chronic kidney disease) stage 3, GFR 30-59 ml/min (HCC) 06/16/2019   Healthcare maintenance 06/16/2019   Carpal tunnel syndrome on right 06/16/2019   Hypertension 08/04/2018   Tobacco dependence 08/04/2018    PCP: Lafe Domino, DO  REFERRING PROVIDER:  Jule Ronal CROME, PA-C  REFERRING DIAG: [phone] (ICD-10-CM) - Chronic midline low back pain with left-sided sciatica   Rationale for Evaluation and Treatment: Rehabilitation  THERAPY DIAG:  Other low back pain  Muscle weakness (generalized)  PERTINENT HISTORY: HTN, DM2, COPD, CKD3,  WEIGHT BEARING RESTRICTIONS: No  FALLS:  Has patient fallen in last 6 months? No  LIVING ENVIRONMENT: Lives with: lives alone Lives in: House/apartment Stairs: Yes: Internal: 12 steps; uses elevator Has following equipment at home: Single point  cane and Walker - 4 wheeled  OCCUPATION: not currently working   PRECAUTIONS: None ---------------------------------------------------------------------------------------------  SUBJECTIVE:                                                                                                                                                                                           SUBJECTIVE STATEMENT: Pt attended with reports of 6/10 pain in LLE, onset 5 days ago and exacerbated with a fall 2 days ago. Feels like her L leg wanted to give out and she missed hte bench when going to sit down. No pop or shift was felt and did not hit her head. Only has noticed some bruising on the L hip, plans to f/u with PCP regarding this  Eval statement 05/01/2024: LBP that has gotten worse in the past 3 weeks. Not sure if it is related to a blocked artery in the hip and groin. Ortho told her it was not related and was sent here for treatment.  Walking and standing for ADLs increases pain. 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   Pain currently 5/10, has n/t in B feet at night in the toes, may be related to DM2. Pt isnt sure if the n/t was present prior to back pain.  RED FLAGS: None    PLOF: Independent  PATIENT GOALS: reduce pain  NEXT MD VISIT: September 2nd with PCP , ortho is 6week follow up ---------------------------------------------------------------------------------------------  OBJECTIVE:  Note: Objective measures were completed at Evaluation unless otherwise noted.  DIAGNOSTIC FINDINGS:  X-rays demonstrate advanced multilevel degenerative changes.  She also has  moderate degenerative changes to both hip joints.   PATIENT SURVEYS:  MODI:31/50 (62%)  COGNITION: Overall cognitive status: Within functional limits for tasks assessed   PALPATION: Tenderness to sacral sulcus and diffuse lumbar spinous processes  Lumbar contraction pattern  L  Multifidus: proper pattern, weak contraction  R Multifidus: proper pattern, weak contraction   SENSATION: Light touch: Impaired   MUSCLE LENGTH: Hamstrings: Right 15; deg; Left 15 deg  POSTURE: rounded shoulders, forward head, and increased lumbar lordosis  LUMBAR ROM:   AROM eval 06/01/2024  Flexion 80%! 100%  Extension 100% 100%  Right lateral flexion 70% 90%  Left lateral flexion 70% 90%  Right rotation 40% 90%  Left rotation 40% 90%   (Blank rows = not tested)  ! Indicates pain with testing  LOWER EXTREMITY ROM:     Active  Right eval Left eval  Hip flexion    Hip extension    Hip abduction    Hip adduction    Hip internal rotation    Hip external rotation    Knee flexion    Knee extension    Ankle dorsiflexion    Ankle plantarflexion    Ankle inversion    Ankle eversion     (Blank rows = not tested)  ! Indicates pain with testing  LOWER EXTREMITY MMT:    MMT Right eval 06/01/2024 Left eval 06/01/2024  Hip flexion 4+ 4+ 4- 4-  Hip extension 4- 5 4- 5  Hip abduction 4- 4+ 4- 4+  Hip adduction      Hip internal rotation      Hip external rotation      Knee flexion 4+ 4+ 4 4+  Knee extension 5 5 5 5   Ankle dorsiflexion      Ankle plantarflexion      Ankle inversion      Ankle eversion       (Blank rows = not tested)   ! Indicates pain with testing LUMBAR SPECIAL TESTS:  Prone instability test: Positive  FUNCTIONAL TESTS:  30 seconds chair stand test 8  GAIT: Distance walked: 144ft Assistive device utilized: Single point cane Level of assistance: Modified independence Comments: decreased step length TREATMENT:  [facility] Adult PT Treatment:                                                DATE: 06/14/2024  Therapeutic Exercise: Nu Step 8'  PROM into knee ext/ankle inv/ hip flexion with release of fibularis mms group and TFL Seated hamstring stretch w/ APT 2x1' B S/L TFL stretch 2x1' LLE Standing  B gastroc stretch on slant  board 3x1' Therapeutic Activity: Supine bridge against GTB 2x12, 3s hold   [facility] Adult PT Treatment:                                                DATE: 06/01/2024  Therapeutic Activity: Objective measures Goal assessment, functional testing Self Care: Pt education POC discussion   [facility] Adult PT Treatment:                                                DATE: 05/18/2024  Therapeutic Exercise: Supine QL stretch 2x1s B EOB hip flexor stretch 2x1'B Supine Lumbar rotation on Pball 2x12 B Therapeutic Activity: NuStep 8' for activity tolerance Supine marching w/PPT 2x12, hold 2s Supine bridge on pball w/ PPT 2x12, hold 3s     [facility] Adult PT Treatment:  DATE: 05/10/2024 Neuromuscular re-ed: Supine bridge on Pball 2x12, hold 3s Pball roll up 2x15, hold 2s Hamstring seated stretch w/ apt 2x1' Therapeutic Activity: NuStep 8' for activity tolerance Supine QL stretch 4x30s B Supine marching w/PPT 2x12, hold 2s PPT practice    [facility] Adult PT Treatment:                                                DATE: 05/01/2024 Self Care: Pt education POC discussion                                                                                                                                PATIENT EDUCATION:  Education details: Pt received education regarding HEP performance, ADL performance, functional activity tolerance, impairment education, appropriate performance of therapeutic activities.  Person educated: Patient Education method: Explanation, Demonstration, Tactile cues, Verbal cues, and Handouts Education comprehension: verbalized understanding and returned demonstration  HOME EXERCISE PROGRAM: Access Code: [email or web address] URL: [email or web address] Date: 05/01/2024 Prepared by: Mabel Kiang  Exercises - Supine Posterior Pelvic Tilt  - 1 x daily - 7 x weekly - 2-3 sets - 15 reps - Supine 90/90 Alternating Heel  Touches with Posterior Pelvic Tilt  - 1 x daily - 4 x weekly - 2-3 sets - 12 reps - Supine Bridge  - 1 x daily - 4 x weekly - 2-3 sets - 12 reps - 4s hold - Hooklying Single Leg Bent Knee Fallouts with Resistance  - 1 x daily - 4 x weekly - 2-3 sets - 12 reps - 2s hold ---------------------------------------------------------------------------------------------  ASSESSMENT:  CLINICAL IMPRESSION: Pt attended physical therapy session for continuation of treatment regarding LBP. Pt attended with new onset of lateral LLE soreness and tenderness following  a fall 2 days ago. Pt gives reports of LLE pain starting 5 days ago though. Today's treatment focused on improvement of  LLE mobility/ motility, and stabilization to reduce kinetic chain compesatory motions leading to back discomfot.  Pt showed good tolerance to administered treatment with no adverse effects by the end of session. Skilled intervention was utilized via activity modification for pt tolerance with task completion, functional progression/regression promoting best outcomes inline with current rehab goals, as well as minimal  verbal/tactile cuing alongside no physical assistance for safe and appropriate performance of today's activities. Continue with therapeutic focus on current POC outline. Pt was also educated to f/u with PCP, I have no concerns for fx or soft tissue damage outside of general bruising from the fall based on clinical presentation and assessment performed today/  Eval impression (05/01/2024): Pt. attended today's physical therapy session for evaluation of LBP. Pt has complaints of 5/10 LBP onset 3 weeks that worsens with prolonged walking or standing. Pt has notable deficits and would benefit from therapeutic focus on lumbar stability, lumbar AROM,  transfer quality, proximal hip strengthening, dynamic core strength/activation, and activity tolerance. Treatment performed today focused on pt education detailed in the objective. Pt  demonstrated great understanding of education provided. required minimal v/t cues and no pysical assistance for appropriate performance with today's activities. Pt requires the intervention of skilled outpatient physical therapy to address the aforementioned deficits and progress towards a functional level in line with therapeutic goals.    OBJECTIVE IMPAIRMENTS: Abnormal gait, decreased activity tolerance, decreased mobility, difficulty walking, decreased ROM, decreased strength, hypomobility, improper body mechanics, postural dysfunction, and pain.   ACTIVITY LIMITATIONS: carrying, lifting, standing, squatting, and locomotion level  PARTICIPATION LIMITATIONS: meal prep, cleaning, laundry, and community activity  PERSONAL FACTORS: Fitness, Past/current experiences, and 3+ comorbidities: see pmh are also affecting patient's functional outcome.   REHAB POTENTIAL: Fair complex PMH  CLINICAL DECISION MAKING: Stable/uncomplicated  EVALUATION COMPLEXITY: Low   GOALS: Goals reviewed with patient? YES  SHORT TERM GOALS: Target date: 05/22/2024  Pt will be independent with administered HEP to demonstrate the competency necessary for long term managemnet of symptoms at home. Baseline: Goal status:MET 06/01/2024  LONG TERM GOALS: Target date: 06/12/2024  Pt. Will achieve a MODI score of 25/50 (50%) as to demonstrate improvement in self-perceived functional ability with daily activities.  Baseline: 22/50 (44%) Goal status: MET 06/01/2024  2.  Pt will improve Global hip strength to a 4+/5 to demonstrate improvement in strength for quality of motion and activity performance. Baseline:  see objective chart Goal status: ONGOING 06/01/2024  3.  Pt will improve Lumbar AROM to 90% of standardized norms with less than 3/10 pain to demonstrate necessary mobility for high quality and safe ADLs  Baseline:  Goal status: MET 06/01/2024   4.  Pt will report the ability to stand for >/= 15 minutes as to  demonstrate improved tolerance to standing for prolonged time and improved ability to participate in ADLs.  Baseline:10 minutes  Goal status: ONGOING ( cannot stand still for 15, can walk if needed)  5. Pt will improve 30s STS to at least 11 to demonstrate improved transfer quality, BLE strength, coordination, and BLE endurance necessary for higher activity tolerance in the community. Baseline: 8 06/01/2024;13 Goal status: MET 06/01/2024 ---------------------------------------------------------------------------------------------  PLAN:  PT FREQUENCY: 2 visits   PT DURATION:4 weeks  PLANNED INTERVENTIONS: 97110-Therapeutic exercises, 97530- Therapeutic activity, 97112- Neuromuscular re-education, 97535- Self Care, 02859- Manual therapy, [phone]- Gait training, Patient/Family education, Balance training, Stair training, Joint mobilization, Spinal mobilization, and Moist heat.  PLAN FOR NEXT SESSION: progress within current POC as tolerated.   Mabel Kiang, PT, DPT 06/14/2024, 12:08 PM   Referring diagnosis?  Chronic midline low back pain with left-sided sciatica [M54.42, G89.29]  Treatment diagnosis? (if different than referring diagnosis)  back pain, other. What was this (referring dx) caused by? []  Surgery []  Fall [x]  Ongoing issue [x]  Arthritis []  Other: ____________  Laterality: []  Rt []  Lt [x]  Both  Check all possible CPT codes:    97110-Therapeutic exercises, 97530- Therapeutic activity, [id number]- Neuromuscular re-education, 97535- Self Care, 02859- Manual therapy, [phone]- Gait training, Patient/Family education, Balance training, Stair training, Joint mobilization, Spinal mobilization, and Moist heat.

## 2024-06-15 ENCOUNTER — Other Ambulatory Visit: Payer: Self-pay | Admitting: Family Medicine

## 2024-06-15 DIAGNOSIS — E1142 Type 2 diabetes mellitus with diabetic polyneuropathy: Secondary | ICD-10-CM

## 2024-06-16 ENCOUNTER — Encounter: Payer: Self-pay | Admitting: Pharmacist

## 2024-06-16 NOTE — Progress Notes (Addendum)
 This patient is appearing on a report for being at risk of failing the adherence measure for hypertension (ACEi/ARB) medications this calendar year.   Medication: Losartan  Last fill date: 05/22/24 for 90 day supply  Insurance report was not up to date. No action needed at this time.   This patient is appearing on a report for being at risk of failing the adherence measure for cholesterol (statin) medications this calendar year.   Medication: atorvastatin  40 mg Last fill date: 04/17/24 for 90 day supply  Insurance report was not up to date. No action needed at this time.

## 2024-06-20 ENCOUNTER — Ambulatory Visit: Admitting: Physical Therapy

## 2024-06-20 ENCOUNTER — Encounter: Payer: Self-pay | Admitting: Physical Therapy

## 2024-06-20 DIAGNOSIS — M6281 Muscle weakness (generalized): Secondary | ICD-10-CM

## 2024-06-20 DIAGNOSIS — M5459 Other low back pain: Secondary | ICD-10-CM | POA: Diagnosis not present

## 2024-06-20 NOTE — Therapy (Signed)
 OUTPATIENT PHYSICAL THERAPY THORACOLUMBAR EVALUATION   Patient Name: Alexandra Campos MRN: [id number] DOB:April 07, 1956, 68 y.o., female Today's Date: 06/20/2024  END OF SESSION:  PT End of Session - 06/20/24 1251     Visit Number 6    Number of Visits 12    Date for Recertification  06/29/24    PT Start Time 1222    PT Stop Time 1300    PT Time Calculation (min) 38 min    Activity Tolerance Patient tolerated treatment well    Behavior During Therapy [facility] for tasks assessed/performed             Past Medical History:  Diagnosis Date   Abnormal ankle brachial index (ABI) 01/14/2020   Abnormal facial hair 11/24/2019   Arthritis    Chronic kidney disease    Complex cyst of right ovary 04/09/2020   Likely a dermoid. Normal CA 125     COPD (chronic obstructive pulmonary disease) (HCC)    Diabetes mellitus without complication (HCC)    GERD (gastroesophageal reflux disease)    Hyperlipidemia    Hypertension    Ovarian cyst 03/09/2020   CA-125 17.3     Peripheral vascular disease    Postmenopausal bleeding 02/07/2020   Rash 04/20/2022   Right upper quadrant pain 01/21/2022   Stress incontinence of urine 10/03/2019   Substance abuse (HCC)    ETOH, THC   Wheezing 12/13/2020   Yeast infection involving the vagina and surrounding area 06/10/2020   Past Surgical History:  Procedure Laterality Date   ABDOMINAL AORTOGRAM W/LOWER EXTREMITY N/A 03/15/2020   Procedure: ABDOMINAL AORTOGRAM W/LOWER EXTREMITY;  Surgeon: Eliza Lonni RAMAN, MD;  Location: [facility] INVASIVE CV [facility];  Service: Cardiovascular;  Laterality: N/A;   ANKLE SURGERY Right    ORIF   CARPAL TUNNEL RELEASE Right 11/18/2022   Procedure: RIGHT CARPAL TUNNEL RELEASE;  Surgeon: Jerri Kay HERO, MD;  Location: [facility];  Service: Orthopedics;  Laterality: Right;   DILATATION & CURETTAGE/HYSTEROSCOPY WITH MYOSURE N/A 08/31/2023   Procedure: DILATATION & CURETTAGE/HYSTEROSCOPY WITH MYOSURE;  Surgeon: Eldonna Mays, MD;  Location: [facility];  Service: Gynecology;  Laterality: N/A;   ROBOTIC ASSISTED BILATERAL SALPINGO OOPHERECTOMY Bilateral 08/31/2023   Procedure: XI ROBOTIC ASSISTED BILATERAL SALPINGO OOPHORECTOMY;  Surgeon: Eldonna Mays, MD;  Location: [facility];  Service: Gynecology;  Laterality: Bilateral;   THROAT SURGERY     Reports polyps removed from throat, done at [facility] approx 15 years ago   TONSILLECTOMY     TUBAL LIGATION Bilateral    WISDOM TOOTH EXTRACTION     Patient Active Problem List   Diagnosis Date Noted   Acne vulgaris 03/09/2024   Glossodynia 02/03/2024   Leg swelling 09/24/2023   Tobacco use 09/24/2023   Adnexal mass 08/31/2023   Pre-op evaluation 08/16/2023   COPD with acute exacerbation (HCC) 08/03/2023   Obstructive lung disease (HCC) 07/09/2022   Low back pain 09/22/2020   Atherosclerosis of artery of extremity with rest pain (HCC) 03/15/2020   Ovarian cyst 03/09/2020   Postmenopausal bleeding 02/07/2020   Type 2 DM with CKD stage 3 and hypertension (HCC) 07/03/2019   Class 3 severe obesity with body mass index (BMI) of 40.0 to 44.9 in adult (HCC) 06/16/2019   CKD (chronic kidney disease) stage 3, GFR 30-59 ml/min (HCC) 06/16/2019   Healthcare maintenance 06/16/2019   Carpal tunnel syndrome on right 06/16/2019   Hypertension 08/04/2018   Tobacco dependence 08/04/2018    PCP: Lafe Domino, DO  REFERRING  PROVIDER: Jule Ronal CROME, PA-C  REFERRING DIAG: [phone] (ICD-10-CM) - Chronic midline low back pain with left-sided sciatica   Rationale for Evaluation and Treatment: Rehabilitation  THERAPY DIAG:  Other low back pain  Muscle weakness (generalized)  PERTINENT HISTORY: HTN, DM2, COPD, CKD3,  WEIGHT BEARING RESTRICTIONS: No  FALLS:  Has patient fallen in last 6 months? No  LIVING ENVIRONMENT: Lives with: lives alone Lives in: House/apartment Stairs: Yes: Internal: 12 steps; uses elevator Has following equipment at home: Single point  cane and Walker - 4 wheeled  OCCUPATION: not currently working   PRECAUTIONS: None ---------------------------------------------------------------------------------------------  SUBJECTIVE:                                                                                                                                                                                           SUBJECTIVE STATEMENT: Pt attended with increased symptoms down LLE, continues to reports no n/t, just radicular pain. Has gone back to rollator to reduce strain on LLE and low back  Eval statement 05/01/2024: LBP that has gotten worse in the past 3 weeks. Not sure if it is related to a blocked artery in the hip and groin. Ortho told her it was not related and was sent here for treatment.  Walking and standing for ADLs increases pain. 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   Pain currently 5/10, has n/t in B feet at night in the toes, may be related to DM2. Pt isnt sure if the n/t was present prior to back pain.  RED FLAGS: None    PLOF: Independent  PATIENT GOALS: reduce pain  NEXT MD VISIT: September 2nd with PCP , ortho is 6week follow up ---------------------------------------------------------------------------------------------  OBJECTIVE:  Note: Objective measures were completed at Evaluation unless otherwise noted.  DIAGNOSTIC FINDINGS:  X-rays demonstrate advanced multilevel degenerative changes.  She also has  moderate degenerative changes to both hip joints.   PATIENT SURVEYS:  MODI:31/50 (62%)  COGNITION: Overall cognitive status: Within functional limits for tasks assessed   PALPATION: Tenderness to sacral sulcus and diffuse lumbar spinous processes  Lumbar contraction pattern  L Multifidus: proper pattern, weak contraction  R Multifidus: proper pattern, weak contraction   SENSATION: Light touch: Impaired   MUSCLE LENGTH: Hamstrings:  Right 15; deg; Left 15 deg  POSTURE: rounded shoulders, forward head, and increased lumbar lordosis   LUMBAR ROM:   AROM eval 06/01/2024  Flexion 80%! 100%  Extension 100% 100%  Right lateral flexion 70% 90%  Left lateral flexion 70% 90%  Right rotation 40% 90%  Left rotation 40% 90%   (  Blank rows = not tested)  ! Indicates pain with testing  LOWER EXTREMITY ROM:     Active  Right eval Left eval  Hip flexion    Hip extension    Hip abduction    Hip adduction    Hip internal rotation    Hip external rotation    Knee flexion    Knee extension    Ankle dorsiflexion    Ankle plantarflexion    Ankle inversion    Ankle eversion     (Blank rows = not tested)  ! Indicates pain with testing  LOWER EXTREMITY MMT:    MMT Right eval 06/01/2024 Left eval 06/01/2024  Hip flexion 4+ 4+ 4- 4-  Hip extension 4- 5 4- 5  Hip abduction 4- 4+ 4- 4+  Hip adduction      Hip internal rotation      Hip external rotation      Knee flexion 4+ 4+ 4 4+  Knee extension 5 5 5 5   Ankle dorsiflexion      Ankle plantarflexion      Ankle inversion      Ankle eversion       (Blank rows = not tested)   ! Indicates pain with testing LUMBAR SPECIAL TESTS:  Prone instability test: Positive  FUNCTIONAL TESTS:  30 seconds chair stand test 8  GAIT: Distance walked: 150ft Assistive device utilized: Single point cane Level of assistance: Modified independence Comments: decreased step length TREATMENT:  [facility] Adult PT Treatment:                                                DATE: 06/20/2024  Neuromuscular re-ed: Sciatic nerve floss  2x30 B, hold 1s Prone press up tolerance 3' Supine bridge w/ppt 2x10, hold 4s Therapeutic Activity: Gait training 400' using SPC Objective testing based on recent symptom changes STS 2x12, no UE a. Self Care: POC discussion Pt education regarding recent symptom changes and f/u discussion with PCP   [facility] Adult PT Treatment:                                                 DATE: 06/14/2024  Therapeutic Exercise: Nu Step 8'  PROM into knee ext/ankle inv/ hip flexion with release of fibularis mms group and TFL Seated hamstring stretch w/ APT 2x1' B S/L TFL stretch 2x1' LLE Standing  B gastroc stretch on slant board 3x1' Therapeutic Activity: Supine bridge against GTB 2x12, 3s hold   [facility] Adult PT Treatment:                                                DATE: 06/01/2024  Therapeutic Activity: Objective measures Goal assessment, functional testing Self Care: Pt education POC discussion   [facility] Adult PT Treatment:                                                DATE: 05/18/2024  Therapeutic Exercise: Supine  QL stretch 2x1s B EOB hip flexor stretch 2x1'B Supine Lumbar rotation on Pball 2x12 B Therapeutic Activity: NuStep 8' for activity tolerance Supine marching w/PPT 2x12, hold 2s Supine bridge on pball w/ PPT 2x12, hold 3s     [facility] Adult PT Treatment:                                                DATE: 05/10/2024 Neuromuscular re-ed: Supine bridge on Pball 2x12, hold 3s Pball roll up 2x15, hold 2s Hamstring seated stretch w/ apt 2x1' Therapeutic Activity: NuStep 8' for activity tolerance Supine QL stretch 4x30s B Supine marching w/PPT 2x12, hold 2s PPT practice    [facility] Adult PT Treatment:                                                DATE: 05/01/2024 Self Care: Pt education POC discussion                                                                                                                                PATIENT EDUCATION:  Education details: Pt received education regarding HEP performance, ADL performance, functional activity tolerance, impairment education, appropriate performance of therapeutic activities.  Person educated: Patient Education method: Explanation, Demonstration, Tactile cues, Verbal cues, and Handouts Education comprehension: verbalized understanding and returned  demonstration  HOME EXERCISE PROGRAM: Access Code: [email or web address] URL: [email or web address] Date: 05/01/2024 Prepared by: Mabel Kiang  Exercises - Supine Posterior Pelvic Tilt  - 1 x daily - 7 x weekly - 2-3 sets - 15 reps - Supine 90/90 Alternating Heel Touches with Posterior Pelvic Tilt  - 1 x daily - 4 x weekly - 2-3 sets - 12 reps - Supine Bridge  - 1 x daily - 4 x weekly - 2-3 sets - 12 reps - 4s hold - Hooklying Single Leg Bent Knee Fallouts with Resistance  - 1 x daily - 4 x weekly - 2-3 sets - 12 reps - 2s hold ---------------------------------------------------------------------------------------------  ASSESSMENT:  CLINICAL IMPRESSION: Pt attended physical therapy session for continuation of treatment regarding LBP w/ LLE radicular symptoms. Today's treatment focused on improvement of  sciatic nerve motility, pain centralization, lumbar stablity, and proximal hip strengthening. Time was taken to discuss and evaluate recnet worsening in symptoms. Pt is displaying s/s inline with neurogenic claudication secondary to spinal stenosis. Pt was educated to f/u with PCP regarding onset of new symptoms. Pt showed fair tolerance to administered treatment with no adverse effects by the end of session. Skilled intervention was utilized via activity modification for pt tolerance with task completion, functional progression/regression promoting best outcomes inline with current rehab goals, as well as minimal verbal/tactile cuing alongside  no physical assistance for safe and appropriate performance of today's activities. F/u with MD visit this Friday 06/23/2024.   Eval impression (05/01/2024): Pt. attended today's physical therapy session for evaluation of LBP. Pt has complaints of 5/10 LBP onset 3 weeks that worsens with prolonged walking or standing. Pt has notable deficits and would benefit from therapeutic focus on lumbar stability, lumbar AROM, transfer quality, proximal hip  strengthening, dynamic core strength/activation, and activity tolerance. Treatment performed today focused on pt education detailed in the objective. Pt demonstrated great understanding of education provided. required minimal v/t cues and no pysical assistance for appropriate performance with today's activities. Pt requires the intervention of skilled outpatient physical therapy to address the aforementioned deficits and progress towards a functional level in line with therapeutic goals.    OBJECTIVE IMPAIRMENTS: Abnormal gait, decreased activity tolerance, decreased mobility, difficulty walking, decreased ROM, decreased strength, hypomobility, improper body mechanics, postural dysfunction, and pain.   ACTIVITY LIMITATIONS: carrying, lifting, standing, squatting, and locomotion level  PARTICIPATION LIMITATIONS: meal prep, cleaning, laundry, and community activity  PERSONAL FACTORS: Fitness, Past/current experiences, and 3+ comorbidities: see pmh are also affecting patient's functional outcome.   REHAB POTENTIAL: Fair complex PMH  CLINICAL DECISION MAKING: Stable/uncomplicated  EVALUATION COMPLEXITY: Low   GOALS: Goals reviewed with patient? YES  SHORT TERM GOALS: Target date: 05/22/2024  Pt will be independent with administered HEP to demonstrate the competency necessary for long term managemnet of symptoms at home. Baseline: Goal status:MET 06/01/2024  LONG TERM GOALS: Target date: 06/12/2024  Pt. Will achieve a MODI score of 25/50 (50%) as to demonstrate improvement in self-perceived functional ability with daily activities.  Baseline: 22/50 (44%) Goal status: MET 06/01/2024  2.  Pt will improve Global hip strength to a 4+/5 to demonstrate improvement in strength for quality of motion and activity performance. Baseline:  see objective chart Goal status: ONGOING 06/01/2024  3.  Pt will improve Lumbar AROM to 90% of standardized norms with less than 3/10 pain to demonstrate necessary  mobility for high quality and safe ADLs  Baseline:  Goal status: MET 06/01/2024   4.  Pt will report the ability to stand for >/= 15 minutes as to demonstrate improved tolerance to standing for prolonged time and improved ability to participate in ADLs.  Baseline:10 minutes  Goal status: ONGOING ( cannot stand still for 15, can walk if needed)  5. Pt will improve 30s STS to at least 11 to demonstrate improved transfer quality, BLE strength, coordination, and BLE endurance necessary for higher activity tolerance in the community. Baseline: 8 06/01/2024;13 Goal status: MET 06/01/2024 ---------------------------------------------------------------------------------------------  PLAN:  PT FREQUENCY: 2 visits   PT DURATION:4 weeks  PLANNED INTERVENTIONS: 97110-Therapeutic exercises, 97530- Therapeutic activity, 97112- Neuromuscular re-education, 97535- Self Care, 02859- Manual therapy, [phone]- Gait training, Patient/Family education, Balance training, Stair training, Joint mobilization, Spinal mobilization, and Moist heat.  PLAN FOR NEXT SESSION: progress within current POC as tolerated.   Mabel Kiang, PT, DPT 06/20/2024, 1:00 PM   Referring diagnosis?  Chronic midline low back pain with left-sided sciatica [M54.42, G89.29]  Treatment diagnosis? (if different than referring diagnosis)  back pain, other. What was this (referring dx) caused by? []  Surgery []  Fall [x]  Ongoing issue [x]  Arthritis []  Other: ____________  Laterality: []  Rt []  Lt [x]  Both  Check all possible CPT codes:    97110-Therapeutic exercises, 97530- Therapeutic activity, 97112- Neuromuscular re-education, 97535- Self Care, 02859- Manual therapy, [phone]- Gait training, Patient/Family education, Balance training, Stair training, Joint mobilization, Spinal mobilization,  and Moist heat.

## 2024-06-21 ENCOUNTER — Other Ambulatory Visit: Payer: Self-pay | Admitting: Pulmonary Disease

## 2024-06-21 DIAGNOSIS — J432 Centrilobular emphysema: Secondary | ICD-10-CM

## 2024-06-22 ENCOUNTER — Ambulatory Visit: Admitting: Physical Therapy

## 2024-06-23 ENCOUNTER — Ambulatory Visit (INDEPENDENT_AMBULATORY_CARE_PROVIDER_SITE_OTHER): Admitting: Family Medicine

## 2024-06-23 VITALS — BP 139/66 | HR 87 | Ht 60.0 in | Wt 193.4 lb

## 2024-06-23 DIAGNOSIS — L0292 Furuncle, unspecified: Secondary | ICD-10-CM

## 2024-06-23 DIAGNOSIS — M79672 Pain in left foot: Secondary | ICD-10-CM

## 2024-06-23 DIAGNOSIS — Z23 Encounter for immunization: Secondary | ICD-10-CM | POA: Diagnosis not present

## 2024-06-23 MED ORDER — LIDOCAINE 5 % EX PTCH: 1.0000 | MEDICATED_PATCH | CUTANEOUS | 0 refills | Status: DC

## 2024-06-23 MED ORDER — DICLOFENAC SODIUM 1 % EX GEL: 2.0000 g | Freq: Four times a day (QID) | CUTANEOUS | 1 refills | Status: AC | PRN

## 2024-06-23 MED ORDER — DOXYCYCLINE HYCLATE 100 MG PO TABS: 100.0000 mg | ORAL_TABLET | Freq: Two times a day (BID) | ORAL | 0 refills | Status: DC

## 2024-06-23 NOTE — Patient Instructions (Addendum)
 An x-ray was ordered for you---you do not need an appointment to have this completed. I recommend going to [facility] [address] [place]  You can also try Lidocaine  patches or Voltaren  gel to the top of your foot.  For your chin I have prescribed antibiotics. If the swelling or pain get worse over the weekend please go to the emergency department. Please schedule a follow up appointment next week so we can make sure this is improving.

## 2024-06-23 NOTE — Progress Notes (Signed)
    SUBJECTIVE:   CHIEF COMPLAINT / HPI:   Left foot swollen Started 1 week ago. Painful. Makes walking difficult. No known injury/trauma. No warmth to foot or leg, no numbness to toes. She can still move her toes and ankle.  Bump on chin Had her chin waxed last week and now has a bump which is painful. Thinks it is getting larger. Not draining or bleeding.  PERTINENT  PMH / PSH: Reviewed.  OBJECTIVE:   BP 139/66   Pulse 87   Ht 5' (1.524 m)   Wt 193 lb 6.4 oz (87.7 kg)   SpO2 99%   BMI 37.77 kg/m   General: Well-appearing, no acute distress. HEENT: normocephalic, PERRLA, EOM grossly intact, MMM.  Right side chin/inferior mandible with 2 to 3 cm area of induration which is quite tender to palpation.  There does appear to be a central pore though nothing is currently draining. Cardio: Regular rate, regular rhythm, no murmurs on exam. Pulm: No increased work of breathing. Extremities: Left foot with moderate swelling and TTP on the dorsal surface. Neurovascularly intact. Left calf without swelling, warmth, or TTP. Moves all extremities equally. Neuro: Alert and oriented x3, speech normal in content, no facial asymmetry Psych:  Cognition and judgment appear intact.   ASSESSMENT/PLAN:   Assessment & Plan Left foot pain No history of trauma, could be stress fracture. Considered DVT however no calf swelling/tenderness to suggest this. Considered gout though she has no history of this and area of swelling is not warm to the touch. - left foot xray ordered - may try lidocaine  patch or topical voltaren  gel to the area for pain relief - encouraged wearing comfortable shoes - return precautions given Furuncle Not draining at this time, and given firmness of nodule it is unlikely anything could be expressed in clinic today. - doxycycline 100 mg BID x7 days - strict return precautions given - follow up scheduled next week so we can reevaluate    Lauraine Norse, DO [facility]  [facility]

## 2024-06-26 ENCOUNTER — Ambulatory Visit
Admission: RE | Admit: 2024-06-26 | Discharge: 2024-06-26 | Disposition: A | Discharge status: Home / Self Care | Source: Ambulatory Visit | Attending: Family Medicine | Admitting: Family Medicine

## 2024-06-26 DIAGNOSIS — M79672 Pain in left foot: Secondary | ICD-10-CM

## 2024-06-26 DIAGNOSIS — M19072 Primary osteoarthritis, left ankle and foot: Secondary | ICD-10-CM | POA: Diagnosis not present

## 2024-06-27 ENCOUNTER — Ambulatory Visit: Admitting: Physical Therapy

## 2024-06-28 NOTE — Progress Notes (Unsigned)
    SUBJECTIVE:   CHIEF COMPLAINT / HPI:   Alexandra Campos is a 68 y.o. female presenting for follow-up of furuncle.  Furuncle Given doxycycline 100 mg BID at last OV on 06/23/24. Today, ***  Other L foot Xray pending.  Healthcare Maintenance: - Diabetic foot exam - Diabetic eye exam - DEXA scan - Shingrix  vaccine ***Schedule yearly?  PERTINENT  PMH / PSH: HTN, T2DM, CKD III, COPD, tobacco use  OBJECTIVE:   There were no vitals taken for this visit.  ***  ASSESSMENT/PLAN:   Assessment & Plan      Alan Flies, MD [facility]

## 2024-06-29 ENCOUNTER — Ambulatory Visit: Payer: Self-pay | Admitting: Family Medicine

## 2024-06-29 ENCOUNTER — Ambulatory Visit

## 2024-06-29 ENCOUNTER — Ambulatory Visit: Admitting: Physical Therapy

## 2024-06-29 ENCOUNTER — Encounter: Payer: Self-pay | Admitting: Physical Therapy

## 2024-06-29 VITALS — BP 119/69 | HR 82 | Ht 60.0 in | Wt 192.2 lb

## 2024-06-29 DIAGNOSIS — M6281 Muscle weakness (generalized): Secondary | ICD-10-CM

## 2024-06-29 DIAGNOSIS — M79672 Pain in left foot: Secondary | ICD-10-CM | POA: Diagnosis not present

## 2024-06-29 DIAGNOSIS — M5459 Other low back pain: Secondary | ICD-10-CM

## 2024-06-29 DIAGNOSIS — L0292 Furuncle, unspecified: Secondary | ICD-10-CM

## 2024-06-29 NOTE — Patient Instructions (Addendum)
 Alexandra Campos,   It was great seeing you in clinic today! You came in for follow-up of the furuncle (boil) on your chin. This looks like it is much improved, and it also sounds like you agree it has improved. As such, I don't want to do anything else to it at this time. If it starts to grow bigger/become painful, please let us  know.  You also had some left foot pain. I am glad to hear this is doing better overall with your exercises and using tiger balm! Please follow up with podiatry on Monday.  I made you an appointment on Tues 11/4 at 1:50pm with Dr. Lafe for your yearly physical. Please bring all your medications to your next office visit.  Thank you for allowing me to be a part of your care team! Alan Flies, MD [facility] [address] [place], [place], [place] [address] [phone]

## 2024-06-29 NOTE — Therapy (Signed)
 OUTPATIENT PHYSICAL THERAPY THORACOLUMBAR EVALUATION   Patient Name: Alexandra Campos MRN: [id number] DOB:January 06, 1956, 68 y.o., female Today's Date: 06/29/2024  END OF SESSION:  PT End of Session - 06/29/24 0933     Visit Number 7    Number of Visits 12    Date for Recertification  06/29/24    PT Start Time 0912    PT Stop Time 0935    PT Time Calculation (min) 23 min              Past Medical History:  Diagnosis Date   Abnormal ankle brachial index (ABI) 01/14/2020   Abnormal facial hair 11/24/2019   Arthritis    Chronic kidney disease    Complex cyst of right ovary 04/09/2020   Likely a dermoid. Normal CA 125     COPD (chronic obstructive pulmonary disease) (HCC)    Diabetes mellitus without complication (HCC)    GERD (gastroesophageal reflux disease)    Hyperlipidemia    Hypertension    Ovarian cyst 03/09/2020   CA-125 17.3     Peripheral vascular disease    Postmenopausal bleeding 02/07/2020   Rash 04/20/2022   Right upper quadrant pain 01/21/2022   Stress incontinence of urine 10/03/2019   Substance abuse (HCC)    ETOH, THC   Wheezing 12/13/2020   Yeast infection involving the vagina and surrounding area 06/10/2020   Past Surgical History:  Procedure Laterality Date   ABDOMINAL AORTOGRAM W/LOWER EXTREMITY N/A 03/15/2020   Procedure: ABDOMINAL AORTOGRAM W/LOWER EXTREMITY;  Surgeon: Eliza Lonni RAMAN, MD;  Location: [facility] INVASIVE CV LAB;  Service: Cardiovascular;  Laterality: N/A;   ANKLE SURGERY Right    ORIF   CARPAL TUNNEL RELEASE Right 11/18/2022   Procedure: RIGHT CARPAL TUNNEL RELEASE;  Surgeon: Jerri Kay HERO, MD;  Location: [facility];  Service: Orthopedics;  Laterality: Right;   DILATATION & CURETTAGE/HYSTEROSCOPY WITH MYOSURE N/A 08/31/2023   Procedure: DILATATION & CURETTAGE/HYSTEROSCOPY WITH MYOSURE;  Surgeon: Eldonna Mays, MD;  Location: [facility];  Service: Gynecology;  Laterality: N/A;   ROBOTIC ASSISTED BILATERAL SALPINGO  OOPHERECTOMY Bilateral 08/31/2023   Procedure: XI ROBOTIC ASSISTED BILATERAL SALPINGO OOPHORECTOMY;  Surgeon: Eldonna Mays, MD;  Location: [facility];  Service: Gynecology;  Laterality: Bilateral;   THROAT SURGERY     Reports polyps removed from throat, done at [facility] approx 15 years ago   TONSILLECTOMY     TUBAL LIGATION Bilateral    WISDOM TOOTH EXTRACTION     Patient Active Problem List   Diagnosis Date Noted   Acne vulgaris 03/09/2024   Glossodynia 02/03/2024   Leg swelling 09/24/2023   Adnexal mass 08/31/2023   Pre-op evaluation 08/16/2023   COPD with acute exacerbation (HCC) 08/03/2023   Obstructive lung disease (HCC) 07/09/2022   Low back pain 09/22/2020   Atherosclerosis of artery of extremity with rest pain (HCC) 03/15/2020   Ovarian cyst 03/09/2020   Postmenopausal bleeding 02/07/2020   Type 2 DM with CKD stage 3 and hypertension (HCC) 07/03/2019   Class 3 severe obesity with body mass index (BMI) of 40.0 to 44.9 in adult (HCC) 06/16/2019   CKD (chronic kidney disease) stage 3, GFR 30-59 ml/min (HCC) 06/16/2019   Healthcare maintenance 06/16/2019   Carpal tunnel syndrome on right 06/16/2019   Hypertension 08/04/2018   Tobacco use 08/04/2018    PCP: Lafe Domino, DO  REFERRING PROVIDER: Jule Ronal CROME, PA-C  REFERRING DIAG: [phone] (ICD-10-CM) - Chronic midline low back pain with left-sided sciatica   Rationale for  Evaluation and Treatment: Rehabilitation  THERAPY DIAG:  Other low back pain  Muscle weakness (generalized)  PERTINENT HISTORY: HTN, DM2, COPD, CKD3,  WEIGHT BEARING RESTRICTIONS: No  FALLS:  Has patient fallen in last 6 months? No  LIVING ENVIRONMENT: Lives with: lives alone Lives in: House/apartment Stairs: Yes: Internal: 12 steps; uses elevator Has following equipment at home: Single point cane and Walker - 4 wheeled  OCCUPATION: not currently working   PRECAUTIONS:  None ---------------------------------------------------------------------------------------------  SUBJECTIVE:                                                                                                                                                                                           SUBJECTIVE STATEMENT: Feels therapy has been very helpful, even started a seated exercise class over at the [facility] Recently had a stress fx in the foot that is being managed with tiger balm and medication. No issues at present. Back pain is continuing to improve. 0/10 back pain currently  Eval statement 05/01/2024: LBP that has gotten worse in the past 3 weeks. Not sure if it is related to a blocked artery in the hip and groin. Ortho told her it was not related and was sent here for treatment.  Walking and standing for ADLs increases pain. 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   Pain currently 5/10, has n/t in B feet at night in the toes, may be related to DM2. Pt isnt sure if the n/t was present prior to back pain.  RED FLAGS: None    PLOF: Independent  PATIENT GOALS: reduce pain  NEXT MD VISIT: September 2nd with PCP , ortho is 6week follow up ---------------------------------------------------------------------------------------------  OBJECTIVE:  Note: Objective measures were completed at Evaluation unless otherwise noted.  DIAGNOSTIC FINDINGS:  X-rays demonstrate advanced multilevel degenerative changes.  She also has  moderate degenerative changes to both hip joints.   PATIENT SURVEYS:  MODI:31/50 (62%)  COGNITION: Overall cognitive status: Within functional limits for tasks assessed   PALPATION: Tenderness to sacral sulcus and diffuse lumbar spinous processes  Lumbar contraction pattern  L Multifidus: proper pattern, weak contraction  R Multifidus: proper pattern, weak contraction   SENSATION: Light touch: Impaired   MUSCLE  LENGTH: Hamstrings: Right 15; deg; Left 15 deg  POSTURE: rounded shoulders, forward head, and increased lumbar lordosis   LUMBAR ROM:   AROM eval 06/01/2024  Flexion 80%! 100%  Extension 100% 100%  Right lateral flexion 70% 90%  Left lateral flexion 70% 90%  Right rotation 40% 90%  Left rotation 40% 90%   (Blank rows =  not tested)  ! Indicates pain with testing  LOWER EXTREMITY ROM:     Active  Right eval Left eval  Hip flexion    Hip extension    Hip abduction    Hip adduction    Hip internal rotation    Hip external rotation    Knee flexion    Knee extension    Ankle dorsiflexion    Ankle plantarflexion    Ankle inversion    Ankle eversion     (Blank rows = not tested)  ! Indicates pain with testing  LOWER EXTREMITY MMT:    MMT Right eval 06/01/2024 Left eval 06/01/2024 06/29/2024  Hip flexion 4+ 4+ 4- 4- 4+  Hip extension 4- 5 4- 5 5  Hip abduction 4- 4+ 4- 4+ 4+  Hip adduction       Hip internal rotation       Hip external rotation       Knee flexion 4+ 4+ 4 4+ 4+  Knee extension 5 5 5 5 5   Ankle dorsiflexion       Ankle plantarflexion       Ankle inversion       Ankle eversion        (Blank rows = not tested)   ! Indicates pain with testing LUMBAR SPECIAL TESTS:  Prone instability test: Positive  FUNCTIONAL TESTS:  30 seconds chair stand test 8  GAIT: Distance walked: 134ft Assistive device utilized: Single point cane Level of assistance: Modified independence Comments: decreased step length TREATMENT:  [facility] Adult PT Treatment:                                                DATE: 06/29/2024  Therapeutic Activity: Objective measures Goal assessment, functional testing Self Care: Pt education    [facility] Adult PT Treatment:                                                DATE: 06/20/2024  Neuromuscular re-ed: Sciatic nerve floss  2x30 B, hold 1s Prone press up tolerance 3' Supine bridge w/ppt 2x10, hold 4s Therapeutic Activity: Gait  training 400' using SPC Objective testing based on recent symptom changes STS 2x12, no UE a. Self Care: POC discussion Pt education regarding recent symptom changes and f/u discussion with PCP   [facility] Adult PT Treatment:                                                DATE: 06/14/2024  Therapeutic Exercise: Nu Step 8'  PROM into knee ext/ankle inv/ hip flexion with release of fibularis mms group and TFL Seated hamstring stretch w/ APT 2x1' B S/L TFL stretch 2x1' LLE Standing  B gastroc stretch on slant board 3x1' Therapeutic Activity: Supine bridge against GTB 2x12, 3s hold   [facility] Adult PT Treatment:  DATE: 06/01/2024  Therapeutic Activity: Objective measures Goal assessment, functional testing Self Care: Pt education POC discussion   [facility] Adult PT Treatment:                                                DATE: 05/18/2024  Therapeutic Exercise: Supine QL stretch 2x1s B EOB hip flexor stretch 2x1'B Supine Lumbar rotation on Pball 2x12 B Therapeutic Activity: NuStep 8' for activity tolerance Supine marching w/PPT 2x12, hold 2s Supine bridge on pball w/ PPT 2x12, hold 3s     [facility] Adult PT Treatment:                                                DATE: 05/10/2024 Neuromuscular re-ed: Supine bridge on Pball 2x12, hold 3s Pball roll up 2x15, hold 2s Hamstring seated stretch w/ apt 2x1' Therapeutic Activity: NuStep 8' for activity tolerance Supine QL stretch 4x30s B Supine marching w/PPT 2x12, hold 2s PPT practice    [facility] Adult PT Treatment:                                                DATE: 05/01/2024 Self Care: Pt education POC discussion                                                                                                                                PATIENT EDUCATION:  Education details: Pt received education regarding HEP performance, ADL performance, functional activity tolerance,  impairment education, appropriate performance of therapeutic activities.  Person educated: Patient Education method: Explanation, Demonstration, Tactile cues, Verbal cues, and Handouts Education comprehension: verbalized understanding and returned demonstration  HOME EXERCISE PROGRAM: Access Code: [email or web address] URL: [email or web address] Date: 05/01/2024 Prepared by: Mabel Kiang  Exercises - Supine Posterior Pelvic Tilt  - 1 x daily - 7 x weekly - 2-3 sets - 15 reps - Supine 90/90 Alternating Heel Touches with Posterior Pelvic Tilt  - 1 x daily - 4 x weekly - 2-3 sets - 12 reps - Supine Bridge  - 1 x daily - 4 x weekly - 2-3 sets - 12 reps - 4s hold - Hooklying Single Leg Bent Knee Fallouts with Resistance  - 1 x daily - 4 x weekly - 2-3 sets - 12 reps - 2s hold ---------------------------------------------------------------------------------------------  ASSESSMENT:  CLINICAL IMPRESSION: Pt attended physical therapy session for re-evaluation of LBP. Pt has met  all goals and continues to work towards no others. Pt is reporting no pain at rest with minimal onset of symptoms for daily activities. Pt  has begun attending seated [employer] exercise class and reports excitement to continue with that. Pt required minimal v/t cuing as well as no assistance for safe and appropriate performance of today's activities. As pt has met all goals and a plan is set for self-management/progression of funcitonal level, pt is to be d/c at completion of today's session.. Education was given to continue applying ADL education from previous sessions as well as performing HEP as prescribed with freedom to progress as tolerated using previous education on modification and exercise dosage. Pt has displayed and verbalized competence regarding this education.     Eval impression (05/01/2024): Pt. attended today's physical therapy session for evaluation of LBP. Pt has complaints of 5/10 LBP onset 3 weeks that  worsens with prolonged walking or standing. Pt has notable deficits and would benefit from therapeutic focus on lumbar stability, lumbar AROM, transfer quality, proximal hip strengthening, dynamic core strength/activation, and activity tolerance. Treatment performed today focused on pt education detailed in the objective. Pt demonstrated great understanding of education provided. required minimal v/t cues and no pysical assistance for appropriate performance with today's activities. Pt requires the intervention of skilled outpatient physical therapy to address the aforementioned deficits and progress towards a functional level in line with therapeutic goals.    OBJECTIVE IMPAIRMENTS: Abnormal gait, decreased activity tolerance, decreased mobility, difficulty walking, decreased ROM, decreased strength, hypomobility, improper body mechanics, postural dysfunction, and pain.   ACTIVITY LIMITATIONS: carrying, lifting, standing, squatting, and locomotion level  PARTICIPATION LIMITATIONS: meal prep, cleaning, laundry, and community activity  PERSONAL FACTORS: Fitness, Past/current experiences, and 3+ comorbidities: see pmh are also affecting patient's functional outcome.   REHAB POTENTIAL: Fair complex PMH  CLINICAL DECISION MAKING: Stable/uncomplicated  EVALUATION COMPLEXITY: Low   GOALS: Goals reviewed with patient? YES  SHORT TERM GOALS: Target date: 05/22/2024  Pt will be independent with administered HEP to demonstrate the competency necessary for long term managemnet of symptoms at home. Baseline: Goal status:MET 06/01/2024  LONG TERM GOALS: Target date: 06/12/2024  Pt. Will achieve a MODI score of 25/50 (50%) as to demonstrate improvement in self-perceived functional ability with daily activities.  Baseline: 22/50 (44%) Goal status: MET 06/01/2024  2.  Pt will improve Global hip strength to a 4+/5 to demonstrate improvement in strength for quality of motion and activity  performance. Baseline:  see objective chart Goal status: MET 06/29/2024  3.  Pt will improve Lumbar AROM to 90% of standardized norms with less than 3/10 pain to demonstrate necessary mobility for high quality and safe ADLs  Baseline:  Goal status: MET 06/01/2024   4.  Pt will report the ability to stand for >/= 15 minutes as to demonstrate improved tolerance to standing for prolonged time and improved ability to participate in ADLs.  Baseline:10 minutes  Goal status: MET 06/29/2024 (15 minutes to wash dishes or similar task with minimal symptoms)  5. Pt will improve 30s STS to at least 11 to demonstrate improved transfer quality, BLE strength, coordination, and BLE endurance necessary for higher activity tolerance in the community. Baseline: 8 06/01/2024;13 Goal status: MET 06/01/2024 ---------------------------------------------------------------------------------------------  PLAN:  PT FREQUENCY: 2 visits   PT DURATION:4 weeks  PLANNED INTERVENTIONS: 97110-Therapeutic exercises, 97530- Therapeutic activity, 97112- Neuromuscular re-education, 97535- Self Care, 02859- Manual therapy, [id number]- Gait training, Patient/Family education, Balance training, Stair training, Joint mobilization, Spinal mobilization, and Moist heat.  PLAN FOR NEXT SESSION: progress within current POC as tolerated.   Mabel Kiang, PT, DPT 06/29/2024, 9:35 AM   Referring diagnosis?  Chronic midline low back pain with left-sided sciatica [M54.42, G89.29]  Treatment diagnosis? (if different than referring diagnosis)  back pain, other. What was this (referring dx) caused by? []  Surgery []  Fall [x]  Ongoing issue [x]  Arthritis []  Other: ____________  Laterality: []  Rt []  Lt [x]  Both  Check all possible CPT codes:    97110-Therapeutic exercises, 97530- Therapeutic activity, 97112- Neuromuscular re-education, 97535- Self Care, 02859- Manual therapy, [phone]- Gait training, Patient/Family education, Balance  training, Stair training, Joint mobilization, Spinal mobilization, and Moist heat.

## 2024-07-03 ENCOUNTER — Encounter: Payer: Self-pay | Admitting: Podiatry

## 2024-07-03 ENCOUNTER — Ambulatory Visit (INDEPENDENT_AMBULATORY_CARE_PROVIDER_SITE_OTHER): Admitting: Podiatry

## 2024-07-03 VITALS — Ht 60.0 in | Wt 192.2 lb

## 2024-07-03 DIAGNOSIS — M19072 Primary osteoarthritis, left ankle and foot: Secondary | ICD-10-CM

## 2024-07-03 MED ORDER — BETAMETHASONE SOD PHOS & ACET 6 (3-3) MG/ML IJ SUSP
3.0000 mg | Freq: Once | INTRAMUSCULAR | Status: AC
  Administered 2024-07-03: 3 mg via INTRA_ARTICULAR

## 2024-07-03 NOTE — Progress Notes (Signed)
 Chief Complaint  Patient presents with   Foot Pain    Pt is here due to left foot swelling, she states the foot has been swelling for 3 weeks, no injury that she can think of, seen her PCP for this issue, x-rays were done , was prescribe lidocaine  patches that she states did not help, started putting tiger balm on foot and it help with the pain and swelling.    HPI: 68 y.o. female presenting today for above complaint  Past Medical History:  Diagnosis Date   Abnormal ankle brachial index (ABI) 01/14/2020   Abnormal facial hair 11/24/2019   Arthritis    Chronic kidney disease    Complex cyst of right ovary 04/09/2020   Likely a dermoid. Normal CA 125     COPD (chronic obstructive pulmonary disease) (HCC)    Diabetes mellitus without complication (HCC)    GERD (gastroesophageal reflux disease)    Hyperlipidemia    Hypertension    Ovarian cyst 03/09/2020   CA-125 17.3     Peripheral vascular disease    Postmenopausal bleeding 02/07/2020   Rash 04/20/2022   Right upper quadrant pain 01/21/2022   Stress incontinence of urine 10/03/2019   Substance abuse (HCC)    ETOH, THC   Wheezing 12/13/2020   Yeast infection involving the vagina and surrounding area 06/10/2020    Past Surgical History:  Procedure Laterality Date   ABDOMINAL AORTOGRAM W/LOWER EXTREMITY N/A 03/15/2020   Procedure: ABDOMINAL AORTOGRAM W/LOWER EXTREMITY;  Surgeon: Eliza Lonni RAMAN, MD;  Location: [facility] INVASIVE CV LAB;  Service: Cardiovascular;  Laterality: N/A;   ANKLE SURGERY Right    ORIF   CARPAL TUNNEL RELEASE Right 11/18/2022   Procedure: RIGHT CARPAL TUNNEL RELEASE;  Surgeon: Jerri Kay HERO, MD;  Location: [facility];  Service: Orthopedics;  Laterality: Right;   DILATATION & CURETTAGE/HYSTEROSCOPY WITH MYOSURE N/A 08/31/2023   Procedure: DILATATION & CURETTAGE/HYSTEROSCOPY WITH MYOSURE;  Surgeon: Eldonna Mays, MD;  Location: [facility];  Service: Gynecology;  Laterality: N/A;   ROBOTIC  ASSISTED BILATERAL SALPINGO OOPHERECTOMY Bilateral 08/31/2023   Procedure: XI ROBOTIC ASSISTED BILATERAL SALPINGO OOPHORECTOMY;  Surgeon: Eldonna Mays, MD;  Location: [facility];  Service: Gynecology;  Laterality: Bilateral;   THROAT SURGERY     Reports polyps removed from throat, done at [facility] approx 15 years ago   TONSILLECTOMY     TUBAL LIGATION Bilateral    WISDOM TOOTH EXTRACTION      No Known Allergies   Physical Exam: General: The patient is alert and oriented x3 in no acute distress.  Dermatology: Skin is warm, dry and supple bilateral lower extremities.   Vascular: Palpable pedal pulses bilaterally. Capillary refill within normal limits.  No appreciable edema.  No erythema.  Skin is warm to touch  Neurological: Grossly intact via light touch  Musculoskeletal Exam: Prominence noted to the anterior process of the calcaneus with associated tenderness.  DG Foot Complete Left (Accession [id number]) (Order [id number]) Imaging Date: 06/26/2024 Department: [facility] [address] Released By: Nevelyn Ramp Authorizing: Donzetta Rollene BRAVO, MD  IMPRESSION: 1. No acute osseous abnormality. 2. First metatarsophalangeal joint osteoarthritis, mild to moderate.  Assessment/Plan of Care: 1.  DJD/arthritis left foot specifically overlying the CC joint and anterior process of the calcaneus  -Patient evaluated.  X-rays reviewed -Injection of 0.5 cc Celestone Soluspan injected around the CC joint left -Continue good tennis shoes  -No NSAIDs.  -Return to clinic PRN     Thresa EMERSON Sar,  DPM [facility]  Dr. Thresa EMERSON Sar, DPM    [address] [place], [place] [address]                Office [phone]  Fax [phone]

## 2024-07-10 DIAGNOSIS — Z5948 Other specified lack of adequate food: Secondary | ICD-10-CM | POA: Diagnosis not present

## 2024-07-10 DIAGNOSIS — Z634 Disappearance and death of family member: Secondary | ICD-10-CM | POA: Diagnosis not present

## 2024-07-10 DIAGNOSIS — M1990 Unspecified osteoarthritis, unspecified site: Secondary | ICD-10-CM | POA: Diagnosis not present

## 2024-07-10 DIAGNOSIS — M5440 Lumbago with sciatica, unspecified side: Secondary | ICD-10-CM | POA: Diagnosis not present

## 2024-07-10 DIAGNOSIS — Z59869 Financial insecurity, unspecified: Secondary | ICD-10-CM | POA: Diagnosis not present

## 2024-07-10 DIAGNOSIS — E785 Hyperlipidemia, unspecified: Secondary | ICD-10-CM | POA: Diagnosis not present

## 2024-07-10 DIAGNOSIS — E1142 Type 2 diabetes mellitus with diabetic polyneuropathy: Secondary | ICD-10-CM | POA: Diagnosis not present

## 2024-07-10 DIAGNOSIS — Z5941 Food insecurity: Secondary | ICD-10-CM | POA: Diagnosis not present

## 2024-07-10 DIAGNOSIS — Z833 Family history of diabetes mellitus: Secondary | ICD-10-CM | POA: Diagnosis not present

## 2024-07-10 DIAGNOSIS — K219 Gastro-esophageal reflux disease without esophagitis: Secondary | ICD-10-CM | POA: Diagnosis not present

## 2024-07-10 DIAGNOSIS — I10 Essential (primary) hypertension: Secondary | ICD-10-CM | POA: Diagnosis not present

## 2024-07-10 DIAGNOSIS — J439 Emphysema, unspecified: Secondary | ICD-10-CM | POA: Diagnosis not present

## 2024-07-10 DIAGNOSIS — Z6837 Body mass index (BMI) 37.0-37.9, adult: Secondary | ICD-10-CM | POA: Diagnosis not present

## 2024-07-10 DIAGNOSIS — Z7982 Long term (current) use of aspirin: Secondary | ICD-10-CM | POA: Diagnosis not present

## 2024-07-14 DIAGNOSIS — E119 Type 2 diabetes mellitus without complications: Secondary | ICD-10-CM | POA: Diagnosis not present

## 2024-07-14 DIAGNOSIS — H2513 Age-related nuclear cataract, bilateral: Secondary | ICD-10-CM | POA: Diagnosis not present

## 2024-07-14 DIAGNOSIS — H353131 Nonexudative age-related macular degeneration, bilateral, early dry stage: Secondary | ICD-10-CM | POA: Diagnosis not present

## 2024-07-14 DIAGNOSIS — H43823 Vitreomacular adhesion, bilateral: Secondary | ICD-10-CM | POA: Diagnosis not present

## 2024-07-17 ENCOUNTER — Encounter: Payer: Self-pay | Admitting: Radiology

## 2024-07-18 ENCOUNTER — Encounter: Payer: Self-pay | Admitting: Family Medicine

## 2024-07-18 ENCOUNTER — Ambulatory Visit: Payer: Self-pay | Admitting: Family Medicine

## 2024-07-18 VITALS — BP 129/70 | Ht 60.0 in | Wt 189.8 lb

## 2024-07-18 DIAGNOSIS — E1122 Type 2 diabetes mellitus with diabetic chronic kidney disease: Secondary | ICD-10-CM | POA: Diagnosis not present

## 2024-07-18 DIAGNOSIS — I10 Essential (primary) hypertension: Secondary | ICD-10-CM | POA: Diagnosis not present

## 2024-07-18 DIAGNOSIS — Z0000 Encounter for general adult medical examination without abnormal findings: Secondary | ICD-10-CM | POA: Diagnosis not present

## 2024-07-18 DIAGNOSIS — Z9189 Other specified personal risk factors, not elsewhere classified: Secondary | ICD-10-CM

## 2024-07-18 DIAGNOSIS — N183 Chronic kidney disease, stage 3 unspecified: Secondary | ICD-10-CM | POA: Diagnosis not present

## 2024-07-18 DIAGNOSIS — I129 Hypertensive chronic kidney disease with stage 1 through stage 4 chronic kidney disease, or unspecified chronic kidney disease: Secondary | ICD-10-CM

## 2024-07-18 MED ORDER — FREESTYLE LIBRE 3 PLUS SENSOR MISC: 3 refills | Status: DC

## 2024-07-18 MED ORDER — ATORVASTATIN CALCIUM 40 MG PO TABS: 40.0000 mg | ORAL_TABLET | Freq: Every day | ORAL | 3 refills | Status: AC

## 2024-07-18 NOTE — Patient Instructions (Addendum)
 It was so good to see you today! Thank you for allowing me to take care of you.  Today we discussed the following concerns and plans:  Call your Orthopedic office to schedule another appointment.  I have referred you for a DEXA scan - this checks your bone density.  Let's try stopping your Metformin . We will recheck your A1c in 3 months.  If you have any concerns, please call the clinic or schedule an appointment.  It was a pleasure to take care of you today. Be well!  Lauraine Norse, DO [facility], PGY-2  Do you need your medications delivered to your home?   We'll send your prescription to the [facility] for delivery.          Address: [address] [place], [place], [place] [address]          Phone: [phone]  Please call the [facility] to speak with a pharmacist and set up your home medication delivery. If you have any questions, feel free to contact us  -- we're happy to help!  Other [facility] that offer affordable prices on both prescriptions and over-the-counter items, as well as convenient services like vaccinations, are  [facility], at [facility]         Address:  [address], [place], [place] [address]         Phone: [phone]  [facility], located in the [facility]        Address: [address], [place], [place] [address]        Phone: [phone]  [facility], at [facility]       Address: [address], [place], [place] [address]       Phone: [phone]  [facility], at [facility]       Address: [address], [place], [place] [address]       Phone: [phone]

## 2024-07-18 NOTE — Assessment & Plan Note (Signed)
 Most recent A1c 6.9.  Has been in controlled diabetic range for some time now.  Metformin  does upset her stomach, and she is currently only taking 500 mg once a day. She has been doing well on Ozempic  2 mg weekly, as well as Jardiance  25 mg daily. - Shared decision making and decided to hold metformin  for the time being and recheck A1c in 3 months; she may be able to continue without metformin  if A1c remains in appropriate range.

## 2024-07-18 NOTE — Progress Notes (Signed)
    SUBJECTIVE:   Chief compliant/HPI: annual examination  Alexandra Campos is a 68 y.o. who presents today for an annual exam.   She is due for foot exam and ophthalmology exam; has previously been referred to podiatry and ophthalmology. - Podiatry appointment coming up in January - Has seen ophthalmology, will try to obtain records  She is also due for shingles vaccine and DEXA scan.  She is taking several OTC vitamins and wonders about them, but cannot recall all the names. CoQ10, B, C, beets.  She is concerned about her memory, reports that it has been worsening in the last several months and she forgets things frequently.  She lost her cane twice last week in the grocery store.  Today she cannot find her cell phone even though she is sure that she had it with her.  OBJECTIVE:   BP 129/70   Ht 5' (1.524 m)   Wt 189 lb 12.8 oz (86.1 kg)   BMI 37.07 kg/m   General: Well-appearing, pleasant, no acute distress. HEENT: normocephalic, PERRLA, EOM grossly intact, MMM Cardio: Regular rate, regular rhythm, no murmurs on exam. Pulm: Clear, no wheezing, no crackles. No increased work of breathing. Abdominal: bowel sounds present, soft, non-tender, non-distended. No HSM. Extremities: no peripheral edema. Moves all extremities equally. Neuro: Alert and oriented x3, speech normal in content, no facial asymmetry Psych:  Cognition and judgment appear intact.   ASSESSMENT/PLAN:   Assessment & Plan Annual physical exam As below. Type 2 DM with CKD stage 3 and hypertension (HCC) Most recent A1c 6.9.  Has been in controlled diabetic range for some time now.  Metformin  does upset her stomach, and she is currently only taking 500 mg once a day. She has been doing well on Ozempic  2 mg weekly, as well as Jardiance  25 mg daily. - Shared decision making and decided to hold metformin  for the time being and recheck A1c in 3 months; she may be able to continue without metformin  if A1c remains in  appropriate range.  Annual Examination  See AVS for age appropriate recommendations  BP reviewed and at goal.   Considered the following items based upon USPSTF recommendations: Diabetes screening: ordered Osteoporosis screening considered. DEXA ordered.   Cancer Screening Discussion  Lung cancer screening:recently had CT lung, reviewed, next due in 2026.SABRA  See documentation below regarding indications/risks/benefits.  Colorectal cancer screening: up to date on screening for CRC. Next due in 2027.  She requests prescription for freestyle libre 3+ sensors; though she does not use insulin  the insurance-provided home nurse told her that she would be eligible for them covered by insurance.  Follow up in 1 year or sooner if indicated.  MyChart Activation: Already signed up  Lauraine Norse, DO [facility]

## 2024-07-20 ENCOUNTER — Telehealth: Payer: Self-pay | Admitting: Pharmacist

## 2024-07-20 NOTE — Telephone Encounter (Signed)
 Patient contacted for follow-up of tobacco cessation and adherence to atorvastatin  40 mg  Since last contact patient reports that she has not been smoking and that she needs a refill of atorvastatin . Called pharmacy to refill medication and pharmacy reports that it is ready for pickup.  Total time with patient call and documentation of interaction: 6 minutes.

## 2024-07-21 ENCOUNTER — Other Ambulatory Visit (HOSPITAL_COMMUNITY): Payer: Self-pay

## 2024-07-21 ENCOUNTER — Telehealth: Payer: Self-pay

## 2024-07-21 NOTE — Telephone Encounter (Addendum)
 Per test claim:  PA REQUIRED PREFER ACCU-CHEK OR TRUE METRIX PREFER DEXCOM OR LIBRE

## 2024-07-24 ENCOUNTER — Other Ambulatory Visit (HOSPITAL_COMMUNITY): Payer: Self-pay

## 2024-07-24 NOTE — Telephone Encounter (Signed)
 Pharmacy Patient Advocate Encounter  Received notification from [employer] that Prior Authorization for FREESTYLE LIBRE 3 PLUS SENSORS has been DENIED.  Full denial letter will be uploaded to the media tab. See denial reason below.   The accepted use of this medical device for your condition requires that you meet one of the following: requires treatment with insulin ; OR has a documented history of problematic hypoglycemia with documentation of one of the following: More than one level 2 hypoglycemic event (defined as glucose less than 54mg /dL (6.9ffno/O)) that persists despite more than one attempt to adjust medication therapy or modification of diabetes treatment plan OR History of one level 3 hypoglycemic event (glucose less than 54mg /dL (6.9ffno/O)) characterized by altered mental or physical state requiring hypoglycemia treatment. You do not meet these requirements. Please ask your provider to send us  new information to show you meet these requirements or tell us  why they should not apply to you.   PA #/Case ID/Reference #: [id number]

## 2024-07-24 NOTE — Telephone Encounter (Signed)
 Awaiting clinical questions from [facility]

## 2024-07-24 NOTE — Telephone Encounter (Signed)
 Prior authorization submitted for FREESTYLE LIBRE 3 PLUS SENSORS to [employer] via Latent.   Key: [id number]

## 2024-07-25 ENCOUNTER — Other Ambulatory Visit (INDEPENDENT_AMBULATORY_CARE_PROVIDER_SITE_OTHER): Payer: Self-pay

## 2024-07-25 ENCOUNTER — Ambulatory Visit (INDEPENDENT_AMBULATORY_CARE_PROVIDER_SITE_OTHER): Admitting: Physician Assistant

## 2024-07-25 VITALS — Ht 59.0 in | Wt 191.0 lb

## 2024-07-25 DIAGNOSIS — M25562 Pain in left knee: Secondary | ICD-10-CM

## 2024-07-25 DIAGNOSIS — M545 Low back pain, unspecified: Secondary | ICD-10-CM | POA: Diagnosis not present

## 2024-07-25 DIAGNOSIS — M1712 Unilateral primary osteoarthritis, left knee: Secondary | ICD-10-CM | POA: Diagnosis not present

## 2024-07-25 DIAGNOSIS — G8929 Other chronic pain: Secondary | ICD-10-CM | POA: Diagnosis not present

## 2024-07-25 MED ORDER — TRAMADOL HCL 50 MG PO TABS: 50.0000 mg | ORAL_TABLET | Freq: Two times a day (BID) | ORAL | 2 refills | Status: AC | PRN

## 2024-07-25 NOTE — Progress Notes (Signed)
 Office Visit Note   Patient: Alexandra Campos           Date of Birth: [birth year]           MRN: [id number] Visit Date: 07/25/2024              Requested by: Lafe Domino, DO [address] [place],  [place] [address] PCP: Lafe Domino, DO   Assessment & Plan: Visit Diagnoses:  1. Unilateral primary osteoarthritis, left knee   2. Chronic midline low back pain, unspecified whether sciatica present     Plan: Impression is chronic low back pain with underlying degenerative disc disease in addition to advanced left knee osteoarthritis.  In regards to the back, she has been getting relief from physical therapy so we will reorder this.  She is not interested in restarting a steroid pack.  Have sent in tramadol  to take as well.  In regards to her knee, we have discussed repeat cortisone injection versus viscosupplementation injection versus total knee replacement surgery.  She is currently not interested in cortisone injection or knee replacement surgery.  She would like to get approval for viscosupplementation injection.  Will also provide her with a total knee replacement handout.  Follow-up once approved for gel injection.  Call with concerns or questions.  This patient is diagnosed with osteoarthritis of the knee(s).    Radiographs show evidence of joint space narrowing, osteophytes, subchondral sclerosis and/or subchondral cysts.  This patient has knee pain which interferes with functional and activities of daily living.    This patient has experienced inadequate response, adverse effects and/or intolerance with conservative treatments such as acetaminophen , NSAIDS, topical creams, physical therapy or regular exercise, knee bracing and/or weight loss.   This patient has experienced inadequate response or has a contraindication to intra articular steroid injections for at least 3 months.   This patient is not scheduled to have a total knee replacement within 6 months of starting treatment with  viscosupplementation.   Follow-Up Instructions: Return for f/u once approved for visco inj.   Orders:  Orders Placed This Encounter  Procedures   XR KNEE 3 VIEW LEFT   Ambulatory referral to Physical Therapy   Ambulatory request for injection medication   Meds ordered this encounter  Medications   traMADol  (ULTRAM ) 50 MG tablet    Sig: Take 1-2 tablets (50-100 mg total) by mouth every 12 (twelve) hours as needed.    Dispense:  30 tablet    Refill:  2      Procedures: No procedures performed   Clinical Data: No additional findings.   Subjective: Chief Complaint  Patient presents with   Left Knee - Pain   Lower Back - Pain    HPI patient is a very pleasant 68 year old female who comes in today with recurrent left knee and low back pain.  Regards to the left knee, she has a history of advanced osteoarthritis.  She has been seen by us  for this multiple times in the past where she has undergone cortisone as well as viscosupplementation injection.  She tells me cortisone injections do not provide any relief.  Viscosupplementation injections provide good but temporary relief.  Her last gel injection was on 12/22/2023.  The pain she is having now is to the entire left knee.  Symptoms are constant worse with activity as well as at night when she is trying to sleep.  She has been taking Tylenol  without any relief.  Other issue she brings up today  is continued low back pain although this has improved.  I saw her for this back in August of this year.  I started her on a steroid pack and sent her to outpatient physical therapy.  She does note that although her pain is still there and has improved quite a bit from PT.  The pain she is having is now just to the middle of the low back with very infrequent radiation down the left leg.  Occasional numbness to her toes.  No bowel or bladder change or saddle paresthesias.  Review of Systems as detailed in HPI.  All others reviewed and are  negative.   Objective: Vital Signs: Ht 4' 11 (1.499 m)   Wt 191 lb (86.6 kg)   BMI 38.58 kg/m   Physical Exam well-developed well-nourished female in no acute distress.  Alert and oriented x 3.  Ortho Exam left knee exam: Small effusion.  Range of motion 0 to 110 degrees.  Valgus deformity.  Medial and lateral joint line tenderness.  She is neurovascular intact distally.  Lumbar spine exam: No spinous or paraspinous tenderness.  Mild discomfort with lumbar extension and flexion.  Negative straight leg raise.  No focal weakness.  She is neurovascularly intact distally.  Specialty Comments:  No specialty comments available.  Imaging: XR KNEE 3 VIEW LEFT Result Date: 07/25/2024 Advanced tricompartmental degenerative changes    PMFS History: Patient Active Problem List   Diagnosis Date Noted   Acne vulgaris 03/09/2024   Glossodynia 02/03/2024   Leg swelling 09/24/2023   Adnexal mass 08/31/2023   Pre-op evaluation 08/16/2023   COPD with acute exacerbation (HCC) 08/03/2023   Obstructive lung disease (HCC) 07/09/2022   Low back pain 09/22/2020   Atherosclerosis of artery of extremity with rest pain (HCC) 03/15/2020   Ovarian cyst 03/09/2020   Postmenopausal bleeding 02/07/2020   Type 2 DM with CKD stage 3 and hypertension (HCC) 07/03/2019   Class 3 severe obesity with body mass index (BMI) of 40.0 to 44.9 in adult (HCC) 06/16/2019   CKD (chronic kidney disease) stage 3, GFR 30-59 ml/min (HCC) 06/16/2019   Healthcare maintenance 06/16/2019   Carpal tunnel syndrome on right 06/16/2019   Hypertension 08/04/2018   Tobacco use 08/04/2018   Past Medical History:  Diagnosis Date   Abnormal ankle brachial index (ABI) 01/14/2020   Abnormal facial hair 11/24/2019   Arthritis    Chronic kidney disease    Complex cyst of right ovary 04/09/2020   Likely a dermoid. Normal CA 125     COPD (chronic obstructive pulmonary disease) (HCC)    Diabetes mellitus without complication (HCC)     GERD (gastroesophageal reflux disease)    Hyperlipidemia    Hypertension    Ovarian cyst 03/09/2020   CA-125 17.3     Peripheral vascular disease    Postmenopausal bleeding 02/07/2020   Rash 04/20/2022   Right upper quadrant pain 01/21/2022   Stress incontinence of urine 10/03/2019   Substance abuse (HCC)    ETOH, THC   Wheezing 12/13/2020   Yeast infection involving the vagina and surrounding area 06/10/2020    Family History  Problem Relation Age of Onset   Stomach cancer Mother    Diabetes Mother    Hypertension Mother    Dementia Mother    Diabetes Father    Hypertension Father    Leukemia Father    Diabetes Sister    Diabetes Sister    Diabetes Brother    Hypertension Brother  Colon cancer Brother 50   Diabetes Brother    Diabetes Maternal Grandmother    Hypertension Maternal Grandmother    Diabetes Daughter    Hypertension Daughter    Diabetes Son    Cancer Paternal Uncle        unknown cancer   Colon polyps Neg Hx    Esophageal cancer Neg Hx    Rectal cancer Neg Hx    Breast cancer Neg Hx    Ovarian cancer Neg Hx    Endometrial cancer Neg Hx     Past Surgical History:  Procedure Laterality Date   ABDOMINAL AORTOGRAM W/LOWER EXTREMITY N/A 03/15/2020   Procedure: ABDOMINAL AORTOGRAM W/LOWER EXTREMITY;  Surgeon: Eliza Lonni RAMAN, MD;  Location: [facility];  Service: Cardiovascular;  Laterality: N/A;   ANKLE SURGERY Right    ORIF   CARPAL TUNNEL RELEASE Right 11/18/2022   Procedure: RIGHT CARPAL TUNNEL RELEASE;  Surgeon: Jerri Kay HERO, MD;  Location: [facility];  Service: Orthopedics;  Laterality: Right;   DILATATION & CURETTAGE/HYSTEROSCOPY WITH MYOSURE N/A 08/31/2023   Procedure: DILATATION & CURETTAGE/HYSTEROSCOPY WITH MYOSURE;  Surgeon: Eldonna Mays, MD;  Location: [facility];  Service: Gynecology;  Laterality: N/A;   ROBOTIC ASSISTED BILATERAL SALPINGO OOPHERECTOMY Bilateral 08/31/2023   Procedure: XI ROBOTIC ASSISTED  BILATERAL SALPINGO OOPHORECTOMY;  Surgeon: Eldonna Mays, MD;  Location: [facility];  Service: Gynecology;  Laterality: Bilateral;   THROAT SURGERY     Reports polyps removed from throat, done at [facility] approx 15 years ago   TONSILLECTOMY     TUBAL LIGATION Bilateral    WISDOM TOOTH EXTRACTION     Social History   Occupational History   Occupation: CNA    Comment: Retired  Tobacco Use   Smoking status: Former    Current packs/day: 0.00    Average packs/day: 0.5 packs/day for 53.3 years (26.6 ttl pk-yrs)    Types: Cigarettes    Start date: 67    Quit date: 12/28/2023    Years since quitting: 0.5    Passive exposure: Current   Smokeless tobacco: Never   Tobacco comments:    Patient stated that she quit smoking 10/16/2023.  Vaping Use   Vaping status: Never Used  Substance and Sexual Activity   Alcohol use: Yes    Alcohol/week: 5.0 standard drinks of alcohol    Types: 5 Cans of beer per week    Comment: 5 drinks/week    Drug use: Yes    Types: Marijuana    Comment: last smoked cig today, last marijuana 11/16/2022   Sexual activity: Not Currently

## 2024-07-27 ENCOUNTER — Telehealth: Payer: Self-pay | Admitting: Physician Assistant

## 2024-07-27 NOTE — Telephone Encounter (Signed)
 Patient calls nurse line in regards to PA denial.   Patient has not had an A1c since June.   However, appears she needs to be on insulin  to meet the criteria.   Will confirm with [facility].

## 2024-07-27 NOTE — Telephone Encounter (Signed)
 Gel injection canceled.

## 2024-07-27 NOTE — Telephone Encounter (Signed)
 Attempted to call patient to cancel next weeks apt.  Per pharmacy team, she is not a candidate for CGM without insulin  use at this time.   If patient calls back, please cancel her apt next week.

## 2024-07-27 NOTE — Telephone Encounter (Signed)
 Pt called wanting her Gel injection shot canceled and that she into going to do the Therapy because she is going to have knee replacement surgery. Call back number is [phone]

## 2024-07-28 ENCOUNTER — Ambulatory Visit: Admitting: Physician Assistant

## 2024-07-31 ENCOUNTER — Other Ambulatory Visit: Payer: Self-pay | Admitting: Family Medicine

## 2024-07-31 MED ORDER — ACCU-CHEK SOFTCLIX LANCETS MISC: 3 refills | Status: AC

## 2024-07-31 MED ORDER — ACCU-CHEK GUIDE W/DEVICE KIT: PACK | 0 refills | Status: AC

## 2024-07-31 MED ORDER — GLUCOSE BLOOD VI STRP: ORAL_STRIP | 12 refills | Status: DC

## 2024-07-31 NOTE — Telephone Encounter (Signed)
 Spoke with patient.   She reports since a CGM will not be covered, she prefers to cancel apt for tomorrow. She prefers to FU with Dr. Lafe for next A1c check.  She is requesting DM testing supplies to her pharmacy.   Will forward to PCP to send in.   I have cancelled her apt for tomorrow.

## 2024-08-02 ENCOUNTER — Ambulatory Visit: Admitting: Family Medicine

## 2024-08-03 ENCOUNTER — Telehealth: Payer: Self-pay

## 2024-08-03 ENCOUNTER — Other Ambulatory Visit: Payer: Self-pay

## 2024-08-03 ENCOUNTER — Other Ambulatory Visit: Payer: Self-pay | Admitting: Family Medicine

## 2024-08-03 ENCOUNTER — Telehealth: Payer: Self-pay | Admitting: Physician Assistant

## 2024-08-03 MED ORDER — NYSTATIN 100000 UNIT/GM EX CREA: TOPICAL_CREAM | Freq: Two times a day (BID) | CUTANEOUS | 0 refills | Status: AC

## 2024-08-03 NOTE — Telephone Encounter (Signed)
 Patient calls nurse line regarding recent visit with orthopedics.   She states that she was advised that she should get a knee replacement. Patient is asking if Dr. Lafe would like her to follow up with the Vein and Vascular specialist prior to having surgery for knee replacement.   Please advise.   Chiquita JAYSON English, RN

## 2024-08-03 NOTE — Telephone Encounter (Signed)
 Patient called and said that her insurance company said that she needs to send the authorization or forms sent to them for the knee brace. CB#[phone]

## 2024-08-03 NOTE — Telephone Encounter (Signed)
 Spoke to patient regarding question about seeing Vascular prior to knee replacement. Advised her to discuss with Ortho since they will be doing the surgery. She is in agreement. Hopes to have surgery in near future but holding off a bit longer to help her sister who has acute care needs.

## 2024-08-04 NOTE — Telephone Encounter (Signed)
 Do you know what she is talking about? I don't see where you ordered a brace for her?

## 2024-08-07 ENCOUNTER — Ambulatory Visit: Admitting: Physical Medicine and Rehabilitation

## 2024-08-09 ENCOUNTER — Encounter: Payer: Self-pay | Admitting: Pharmacist

## 2024-08-09 NOTE — Progress Notes (Signed)
 This patient is appearing on a report for being at risk of failing the adherence measure for cholesterol (statin) medications this calendar year.   Medication: atorvastatin  Last fill date: 11//4/25 for 90 day supply  Complete for 2025

## 2024-08-14 ENCOUNTER — Telehealth: Payer: Self-pay

## 2024-08-14 ENCOUNTER — Telehealth: Payer: Self-pay | Admitting: Orthopaedic Surgery

## 2024-08-14 NOTE — Telephone Encounter (Signed)
-----   Message from Lauraine Norse sent at 07/18/2024  2:26 PM EST ----- Please help me schedule this patient in Geriatric clinic for concerns of poor memory. Thanks!

## 2024-08-14 NOTE — Telephone Encounter (Signed)
 Patient called and said that she needs to order a knee brace. CB#[phone]

## 2024-08-14 NOTE — Telephone Encounter (Signed)
 Spoke with patient made Geri appt for Dec 4th at 1:30 for memory eval. Nelson Land, CMA

## 2024-08-14 NOTE — Telephone Encounter (Signed)
 I do not recall ordering a brace for her, and if I did, I would have likely put it in my dictation.  She may have asked about getting one otc, but I do not remember.  Do you mind checking with her?

## 2024-08-14 NOTE — Telephone Encounter (Signed)
 We can get her one of our OA reaction braces if she would like.

## 2024-08-14 NOTE — Telephone Encounter (Signed)
 Called and spoke with patient. Let her know that I would see what you recommend when you return tomorrow from vacation.

## 2024-08-15 ENCOUNTER — Other Ambulatory Visit: Payer: Self-pay | Admitting: Family Medicine

## 2024-08-15 DIAGNOSIS — Z8719 Personal history of other diseases of the digestive system: Secondary | ICD-10-CM

## 2024-08-15 NOTE — Telephone Encounter (Signed)
 Called and spoke with patient. She will come Thursday to be fitted for a reaction brace.

## 2024-08-16 NOTE — Telephone Encounter (Signed)
 Spoke with patient to confirm that she is coming to [facility] tomorrow at 1:30. Patient stated that she will be here. Nelson Land, CMA

## 2024-08-17 ENCOUNTER — Ambulatory Visit

## 2024-08-17 VITALS — BP 155/74 | HR 84 | Ht 59.0 in | Wt 190.2 lb

## 2024-08-17 DIAGNOSIS — R4182 Altered mental status, unspecified: Secondary | ICD-10-CM

## 2024-08-17 DIAGNOSIS — R4181 Age-related cognitive decline: Secondary | ICD-10-CM

## 2024-08-17 DIAGNOSIS — N183 Chronic kidney disease, stage 3 unspecified: Secondary | ICD-10-CM

## 2024-08-17 DIAGNOSIS — R413 Other amnesia: Secondary | ICD-10-CM

## 2024-08-17 DIAGNOSIS — E1159 Type 2 diabetes mellitus with other circulatory complications: Secondary | ICD-10-CM

## 2024-08-17 NOTE — Progress Notes (Signed)
 Provider:  Krystal Bale, MD Location:      Place of Service:     PCP: Lafe Domino, DO Patient Care Team: Lafe Domino, DO as PCP - [facility])  Extended Emergency Contact Information Primary Emergency Contact: Verdon Koren [place], [place] [address] [place] Home Phone: [phone] Mobile Phone: [phone] Relation: Son Secondary Emergency Contact: Mcleish,SHENNA Home Phone: [phone] Work Phone: [phone] Mobile Phone: [phone] Relation: Sister  Code Status: DNR Goals of Care: Advanced Directive information    08/17/2024    1:52 PM  Advanced Directives  Does Patient Have a Medical Advance Directive? No  Would patient like information on creating a medical advance directive? No - Patient declined     [facility]:   Patient is alone Primary caregiver:  patient Patient's Currently living arrangement:  patient Patient information was obtained from historical medical records and office notes  patient. History/Exam limitations:  none Primary Care Provider:   Domino Lafe DO Referring provider:  Domino Lafe DO Reason for referral:  Memory impairment  Healthcare Insurances: Primary [employer] Medicare, secondary [place] medicaid. ----------------------------------------------------------------------------------------------------------------------------------------------------------------------------------------------------------------------------------------------------------------   HPI by problems:  Short-term memory impairment  Cognitive impairment concern  Are there problems with thinking?  Cognition domains: Memory difficulties  When were the changes first noticed?  year  Did this change occur abruptly or gradually?  gradual  How have the changes progressed since then?  gradually worsening  Has there been any tremors or abnormal movements?  no  Have they had in hallucinations or  delusions:  no  Have they appeared more anxious or sad lately?  no  Do they still have interests or activities they enjor doing?  yes  How has their appetite been lately?  N/A  How has their sleep been lately?  N/A  Does patient snore, gasp or stop breathing in their sleep:  Not discussed  Compared to 5 to 10 years ago, how is the patient at:  Problems with Judgment, e.g., problem making decisions, bad financial decisions, problems with thinking?  no  Less interested in hobbies or previously enjoyed activities?  no  Problem remembering things about family and friends e.g. names,  occupations, birthdays, addresses?  no  Problem remembering conversations or news events a few days later?  yes  Problem remembering what day and month it is? yes  Problem with losing things?  yes  Problem learning to use a new gadget or machine around the house, e.g., cell phones, computer, microwave, remote control?  yes  Problem with handling money for shopping?  no  Problem handling financial matters, e.g. their pension, checking, credit cards, dealing with the bank?  no  Problem with getting lost in familiar places?  no  Geriatric Depression Scale:  4 / 15  PHQ-9: Flowsheet Row Office Visit from 07/18/2024 in [facility] [place]  PHQ-9 Total Score 6     Outpatient Encounter Medications as of 08/17/2024  Medication Sig   Accu-Chek Softclix Lancets lancets Use to check blood sugar 3 times daily   acetaminophen  (TYLENOL ) 650 MG CR tablet Take 650 mg by mouth every 8 (eight) hours as needed for pain.   albuterol  (VENTOLIN  HFA) 108 (90 Base) MCG/ACT inhaler INHALE 2 PUFFS BY MOUTH EVERY 4 HOURS AS NEEDED FOR WHEEZING AND FOR SHORTNESS OF BREATH . APPOINTMENT REQUIRED FOR FUTURE REFILLS   antiseptic oral rinse (BIOTENE) LIQD  15 mLs by Mouth Rinse route as needed for dry mouth.   aspirin EC 81 MG tablet Take 81 mg by mouth daily.  Swallow whole.   atorvastatin  (LIPITOR) 40 MG tablet Take 1 tablet (40 mg total) by mouth daily.   Blood Glucose Monitoring Suppl (ACCU-CHEK GUIDE) w/Device KIT Use to check blood sugar 3 times per day   buPROPion  (WELLBUTRIN  SR) 150 MG 12 hr tablet Take 1 tablet by mouth twice daily (Patient taking differently: Take 150 mg by mouth daily.)   cetirizine  (ZYRTEC ) 10 MG tablet Take 1 tablet (10 mg total) by mouth daily.   chlorthalidone  (HYGROTON ) 25 MG tablet Take 1 tablet (25 mg total) by mouth daily.   diclofenac  Sodium (VOLTAREN ) 1 % GEL Apply 2 g topically 4 (four) times daily as needed (pain).   empagliflozin  (JARDIANCE ) 25 MG TABS tablet Take 1 tablet (25 mg total) by mouth daily.   fluticasone -salmeterol (ADVAIR) 250-50 MCG/ACT AEPB INHALE 1 PUFF IN THE MORNING AND AT BEDTIME   glucose blood (ACCU-CHEK GUIDE TEST) test strip 1 each by Other route See admin instructions. E11.9 testing 3 times per day   losartan  (COZAAR ) 100 MG tablet Take 1 tablet (100 mg total) by mouth at bedtime.   metFORMIN  (GLUCOPHAGE -XR) 500 MG 24 hr tablet Take 1 tablet (500 mg total) by mouth daily with breakfast.   Misc. Devices (ROLLATOR ULTRA-LIGHT) MISC Prior rollator dispensed in 2023 is irretrievably broken  Currently using a cane Cannot ambulate > 300 feet without fatigue, pain, shortness of breath Has multiple chronic conditions making it difficult to ambulate without assistance (COPD and multilevel osteoarthritis) High risk for falls without assistive device   Multiple Vitamin (MULTIVITAMIN PO) Take 1 tablet by mouth daily.   nystatin  cream (MYCOSTATIN ) Apply topically 2 (two) times daily.   omeprazole  (PRILOSEC) 20 MG capsule Take 1 capsule by mouth once daily   pregabalin  (LYRICA ) 25 MG capsule Take 1 capsule (25 mg total) by mouth 2 (two) times daily.   Semaglutide , 2 MG/DOSE, (OZEMPIC , 2 MG/DOSE,) 8 MG/3ML SOPN Inject 2 mg into the skin once a week.   spironolactone  (ALDACTONE ) 25 MG tablet TAKE 1  TABLET BY MOUTH AT BEDTIME   Tiotropium Bromide Monohydrate  (SPIRIVA  RESPIMAT) 2.5 MCG/ACT AERS Inhale 2 sprays into the lungs daily.   traMADol  (ULTRAM ) 50 MG tablet Take 1-2 tablets (50-100 mg total) by mouth every 12 (twelve) hours as needed.   vitamin B-12 (CYANOCOBALAMIN) 100 MCG tablet Take 100 mcg by mouth daily.   [DISCONTINUED] methocarbamol  (ROBAXIN ) 500 MG tablet Take 1 tablet by mouth twice daily as needed   No facility-administered encounter medications on file as of 08/17/2024.    History Patient Active Problem List   Diagnosis Date Noted   COPD with acute exacerbation (HCC) 08/03/2023    Priority: High   Obstructive lung disease (HCC) 07/09/2022    Priority: High   Atherosclerosis of artery of extremity with rest pain (HCC) 03/15/2020    Priority: High   Type 2 DM with CKD stage 3 and hypertension (HCC) 07/03/2019    Priority: High   Class 3 severe obesity with body mass index (BMI) of 40.0 to 44.9 in adult (HCC) 06/16/2019    Priority: High   CKD (chronic kidney disease) stage 3, GFR 30-59 ml/min (HCC) 06/16/2019    Priority: High   Hypertension associated with diabetes (HCC) 08/04/2018    Priority: High   Acne vulgaris 03/09/2024   Glossodynia 02/03/2024   Leg swelling 09/24/2023  Adnexal mass 08/31/2023   Pre-op evaluation 08/16/2023   Low back pain 09/22/2020   Ovarian cyst 03/09/2020   Postmenopausal bleeding 02/07/2020   Healthcare maintenance 06/16/2019   Carpal tunnel syndrome on right 06/16/2019   Tobacco use 08/04/2018   Past Medical History:  Diagnosis Date   Abnormal ankle brachial index (ABI) 01/14/2020   Abnormal facial hair 11/24/2019   Arthritis    Chronic kidney disease    Complex cyst of right ovary 04/09/2020   Likely a dermoid. Normal CA 125     COPD (chronic obstructive pulmonary disease) (HCC)    Diabetes mellitus without complication (HCC)    GERD (gastroesophageal reflux disease)    Hyperlipidemia    Hypertension    Ovarian  cyst 03/09/2020   CA-125 17.3     Peripheral vascular disease    Postmenopausal bleeding 02/07/2020   Right upper quadrant pain 01/21/2022   Stress incontinence of urine 10/03/2019   Substance abuse (HCC)    ETOH, THC   Wheezing 12/13/2020   Yeast infection involving the vagina and surrounding area 06/10/2020   Past Surgical History:  Procedure Laterality Date   ABDOMINAL AORTOGRAM W/LOWER EXTREMITY N/A 03/15/2020   Procedure: ABDOMINAL AORTOGRAM W/LOWER EXTREMITY;  Surgeon: Eliza Lonni RAMAN, MD;  Location: [facility] CV LAB;  Service: Cardiovascular;  Laterality: N/A;   ANKLE SURGERY Right    ORIF   CARPAL TUNNEL RELEASE Right 11/18/2022   Procedure: RIGHT CARPAL TUNNEL RELEASE;  Surgeon: Jerri Kay HERO, MD;  Location: [facility];  Service: Orthopedics;  Laterality: Right;   DILATATION & CURETTAGE/HYSTEROSCOPY WITH MYOSURE N/A 08/31/2023   Procedure: DILATATION & CURETTAGE/HYSTEROSCOPY WITH MYOSURE;  Surgeon: Eldonna Mays, MD;  Location: [facility];  Service: Gynecology;  Laterality: N/A;   ROBOTIC ASSISTED BILATERAL SALPINGO OOPHERECTOMY Bilateral 08/31/2023   Procedure: XI ROBOTIC ASSISTED BILATERAL SALPINGO OOPHORECTOMY;  Surgeon: Eldonna Mays, MD;  Location: [facility];  Service: Gynecology;  Laterality: Bilateral;   THROAT SURGERY     Reports polyps removed from throat, done at [facility] approx 15 years ago   TONSILLECTOMY     TUBAL LIGATION Bilateral    WISDOM TOOTH EXTRACTION     Family History  Problem Relation Age of Onset   Stomach cancer Mother    Diabetes Mother    Hypertension Mother    Dementia Mother    Diabetes Father    Hypertension Father    Leukemia Father    Diabetes Sister    Diabetes Sister    Diabetes Brother    Hypertension Brother    Colon cancer Brother 50   Diabetes Brother    Diabetes Maternal Grandmother    Hypertension Maternal Grandmother    Diabetes Daughter    Hypertension Daughter    Diabetes Son    Cancer Paternal  Uncle        unknown cancer   Colon polyps Neg Hx    Esophageal cancer Neg Hx    Rectal cancer Neg Hx    Breast cancer Neg Hx    Ovarian cancer Neg Hx    Endometrial cancer Neg Hx    Social History   Socioeconomic History   Marital status: Single    Spouse name: Not on file   Number of children: 1   Years of education: 12   Highest education level: GED or equivalent  Occupational History   Occupation: CNA    Comment: Retired  Tobacco Use   Smoking status: Former    Current packs/day: 0.00  Average packs/day: 0.5 packs/day for 53.3 years (26.6 ttl pk-yrs)    Types: Cigarettes    Start date: 60    Quit date: 12/28/2023    Years since quitting: 0.6    Passive exposure: Current   Smokeless tobacco: Never   Tobacco comments:    Patient stated that she quit smoking 10/16/2023.  Vaping Use   Vaping status: Never Used  Substance and Sexual Activity   Alcohol use: Yes    Alcohol/week: 5.0 standard drinks of alcohol    Types: 5 Cans of beer per week    Comment: 5 drinks/week    Drug use: Yes    Types: Marijuana    Comment: last smoked cig today, last marijuana 11/16/2022   Sexual activity: Not Currently  Other Topics Concern   Not on file  Social History Narrative   Patient lives alone in an independent living facility.  [place] [address]    Patient walks the parameter daily ~10 minutes.    Patient enjoys seeing her neighbors.    Patient has one son and one daughter who passed away. Her mother passed 07-09-2023   Social Drivers of Health   Financial Resource Strain: Low Risk  (11/25/2023)   Overall Financial Resource Strain (CARDIA)    Difficulty of Paying Living Expenses: Not very hard  Food Insecurity: Food Insecurity Present (11/25/2023)   Hunger Vital Sign    Worried About Running Out of Food in the Last Year: Sometimes true    Ran Out of Food in the Last Year: Sometimes true  Transportation Needs: No Transportation Needs (11/25/2023)    PRAPARE - Administrator, Civil Service (Medical): No    Lack of Transportation (Non-Medical): No  Physical Activity: Sufficiently Active (11/25/2023)   Exercise Vital Sign    Days of Exercise per Week: 6 days    Minutes of Exercise per Session: 30 min  Stress: No Stress Concern Present (11/25/2023)   [employer] of Occupational Health - Occupational Stress Questionnaire    Feeling of Stress : Only a little  Social Connections: Moderately Integrated (11/25/2023)   Social Connection and Isolation Panel    Frequency of Communication with Friends and Family: More than three times a week    Frequency of Social Gatherings with Friends and Family: Twice a week    Attends Religious Services: More than 4 times per year    Active Member of [employer] or Organizations: No    Attends Engineer, Structural: 1 to 4 times per year    Marital Status: Never married   Basic Activities of Daily Living  Dressing: Self-care Eating: Self-care Ambulation: Self-care Toileting: Self-care Bathing: Self-care  Instrumental Activities of Daily Living Shopping: Self-care House/Yard Work: Designer, Jewellery of medications: Self-care Finances: Self-care Telephone: Self-care Transportation: Self-care  Mobility Assist Devices: Rollator  Caregivers in home: patient  Caregiver Stress Self-Assessment (Zarit Score):  Not done   Formal Home Health Assistance  Physical Therapy: no  Occupational Therapy: no             Home Aid / Personal Care Service: no             Homemaker services: no  FALLS in last five office visits:     06/29/2024   10:37 AM 06/23/2024    2:16 PM 05/16/2024    9:47 AM 03/28/2024    9:13 AM 03/21/2024    8:23 AM  Fall Risk   Falls in the past year? 1  0 0 0 0  Number falls in past yr: 0 0   0  Injury with Fall? 0  0    0      Data saved with a previous flowsheet row definition    Health Maintenance reviewed: Immunization History  Administered Date(s)  Administered   Fluad Trivalent(High Dose 65+) 05/31/2023   INFLUENZA, HIGH DOSE SEASONAL PF 05/16/2024   Influenza Inj Mdck Quad Pf 07/07/2018   Influenza,inj,Quad PF,6+ Mos 06/14/2019, 07/03/2020, 05/26/2021, 07/17/2022   PFIZER Comirnaty(Gray Top)Covid-19 Tri-Sucrose Vaccine 02/26/2021, 07/01/2021   PFIZER(Purple Top)SARS-COV-2 Vaccination 11/23/2019, 12/14/2019, 07/08/2020   PNEUMOCOCCAL CONJUGATE-20 07/21/2022   PPD Test 05/06/2020, 05/05/2021   Pfizer(Comirnaty)Fall Seasonal Vaccine 12 years and older 07/23/2022, 05/31/2023, 12/29/2023, 06/23/2024   Pneumococcal Conjugate-13 07/03/2019   Pneumococcal Polysaccharide-23 07/08/2020   Tdap 07/03/2019   Health Maintenance Topics with due status: Overdue     Topic Date Due   Zoster Vaccines- Shingrix  Never done   Bone Density Scan Never done   OPHTHALMOLOGY EXAM 01/21/2024   Health Maintenance Topics with due status: Due Soon     Topic Date Due   Diabetic kidney evaluation - eGFR measurement 08/30/2024    Diet: Regular Nutritional supplements: None  Geriatric Syndromes: Not discussed 2/2 time constraint  Vital Signs Weight: 190 lb 4 oz (86.3 kg) Body mass index is 38.43 kg/m. CrCl cannot be calculated (Patient's most recent lab result is older than the maximum 21 days allowed.). Body surface area is 1.9 meters squared. Vitals:   08/17/24 1354  BP: (!) 155/74  Pulse: 84  SpO2: 100%  Weight: 190 lb 4 oz (86.3 kg)  Height: 4' 11 (1.499 m)   Wt Readings from Last 3 Encounters:  08/17/24 190 lb 4 oz (86.3 kg)  07/25/24 191 lb (86.6 kg)  07/18/24 189 lb 12.8 oz (86.1 kg)   Hearing Screening   250Hz  500Hz  1000Hz  2000Hz  4000Hz   Right ear 20 20 20 20 20   Left ear 20 20 20 20 20    Vision Screening   Right eye Left eye Both eyes  Without correction 20/30 20/20 20/20   With correction     Comments: Last eye exam was in May of 2025.    Physical Examination:  VS reviewed Physical Exam  Vitals:   08/17/24 1354   BP: (!) 155/74  Pulse: 84  SpO2: 100%    General: NAD, well-appearing, well-nourished Respiratory: No respiratory distress, breathing comfortably, able to speak in full sentences Skin: warm and dry, no rashes noted on exposed skin Psych: Appropriate affect and mood      11/25/2023    3:52 PM 12/24/2022   10:13 AM 11/04/2021   10:56 AM  6CIT Screen  What Year? 0 points 0 points 0 points  What month? 0 points 0 points 0 points  What time? 0 points 0 points 0 points  Count back from 20 0 points 0 points 0 points  Months in reverse 0 points 0 points 0 points  Repeat phrase 0 points 0 points 0 points  Total Score 0 points 0 points 0 points       11/25/2023    3:53 PM  MMSE - Mini Mental State Exam  Not completed: Unable to complete           08/17/2024    3:23 PM  Montreal Cognitive Assessment   Visuospatial/ Executive (0/5) 3  Naming (0/3) 3  Attention: Read list of digits (0/2) 1  Attention: Read list of letters (0/1) 1  Attention:  Serial 7 subtraction starting at 100 (0/3) 2  Language: Repeat phrase (0/2) 1  Language : Fluency (0/1) 1  Abstraction (0/2) 2  Delayed Recall (0/5) 5  Orientation (0/6) 6  Total 25  Adjusted Score (based on education) 26     Labs No components found for: VITAMIND  No results found for: VITAMINB12  No results found for: FOLATE  Lab Results  Component Value Date   TSH 1.400 07/06/2018    No results found for: RPR  Lab Results  Component Value Date   HIV Non Reactive 06/14/2019      Chemistry      Component Value Date/Time   NA 136 08/31/2023 1125   NA 142 08/16/2023 1506   NA 139 12/12/2012 1420   K 3.3 (L) 08/31/2023 1125   K 3.4 (L) 12/12/2012 1420   CL 105 08/31/2023 1125   CL 106 12/12/2012 1420   CO2 20 (L) 08/31/2023 1125   CO2 24 12/12/2012 1420   BUN 22 08/31/2023 1125   BUN 23 08/16/2023 1506   BUN 12 12/12/2012 1420   CREATININE 1.30 (H) 03/23/2024 1350   CREATININE 0.71 12/12/2012 1420       Component Value Date/Time   CALCIUM  9.0 08/31/2023 1125   CALCIUM  7.8 (L) 12/12/2012 1420   ALKPHOS 73 08/31/2023 1125   ALKPHOS 129 12/12/2012 0208   AST 16 08/31/2023 1125   AST 16 12/12/2012 0208   ALT 17 08/31/2023 1125   ALT 18 12/12/2012 0208   BILITOT 0.5 08/31/2023 1125   BILITOT 0.3 08/16/2023 1506   BILITOT 0.4 12/12/2012 0208       CrCl cannot be calculated (Patient's most recent lab result is older than the maximum 21 days allowed.).   Lab Results  Component Value Date   HGBA1C 6.9 03/09/2024     @10RELATIVEDAYS @ Hearing Screening   250Hz  500Hz  1000Hz  2000Hz  4000Hz   Right ear 20 20 20 20 20   Left ear 20 20 20 20 20    Vision Screening   Right eye Left eye Both eyes  Without correction 20/30 20/20 20/20   With correction     Comments: Last eye exam was in May of 2025.   Lab Results  Component Value Date   WBC 7.4 08/31/2023   HGB 9.9 (L) 08/31/2023   HCT 34.3 (L) 08/31/2023   MCV 91.5 08/31/2023   PLT 288 08/31/2023    No results found for this or any previous visit (from the past 24 hours).  Imaging Head CT: Not completed   Personal Strengths Ability for insight  Active sense of humor  Average or above average intelligence  Financial means  Special hobby/interest  Supportive family/friends   Support System Strengths Supportive Relationships, Hopefulness, and Self Advocate   Advanced Directives Code Status: Not discussed Advance Directives: Not  dicussed  ------------------------------------------------------------------------------------------------------------------------------------------------------------------------------------------------------------------------------------------------------------------------------------------------------------------------------------------------------------------------------------------------------------------------------------------------------------------------------------------------------------  Assessment and Plan: Please see individual consultation notes from physical therapy, pharmacy and social work for today.    Problem List Items Addressed This Visit   None Visit Diagnoses       Memory difficulties    -  Primary     Age-related cognitive decline          Assessment & Plan Memory difficulties Age-related cognitive decline Adjusted MOCA 26 Independent in all IADLs and ADLs Discussed age related cognitive decline, and protective life-style modifications such as exercise, socialization, and mediterranean diet  Primary Contact: Extended Emergency Contact Information Primary Emergency Contact: Erny,Tony L  [place], [place] [address] [place] Home Phone: [phone] Mobile Phone: [phone] Relation: Son Secondary Emergency Contact: Borntreger,SHENNA Home Phone: [phone] Work Phone: [phone] Mobile Phone: [phone] Relation: Sister  > 45 minutes face to face were spent in total with precharting, history & physical gathering, nterdisciplinary discussion, patient and caretaker counseling and coordination of care and documentation. The Geriatric medical team meet to discuss the patient's Greater than 50% of this time was spent in counseling, explanation of diagnosis, planning of further management, and coordination of care.  The interdisciplinary team consisted of representatives from medicine.  Referral to [facility] for SDOH review  and education about resources was not made.

## 2024-08-17 NOTE — Patient Instructions (Signed)
 It was great to see you! Thank you for allowing me to participate in your care!   I recommend that you always bring your medications to each appointment as this makes it easy to ensure we are on the correct medications and helps us  not miss when refills are needed.  Our plans for today:  - I recommend 30 minutes of exercise daily - I recommend staying social with family and friends - Mediterranean diet has been shown to be heart and memory protective  Take care and seek immediate care sooner if you develop any concerns. Please remember to show up 15 minutes before your scheduled appointment time!  Gladis Church, DO [facility]

## 2024-08-17 NOTE — Progress Notes (Signed)
 Patient came in and was fit with an XL reaction brace on the left knee.

## 2024-08-18 NOTE — Progress Notes (Signed)
 Patient ID: Alexandra Campos, female   DOB: [birth year], 68 y.o.   MRN: [id number] I was present for Dr Dudley visit with Ms Rabelo. I have reviewed resident's note and I agree with their findings and treatment plan.

## 2024-08-29 ENCOUNTER — Telehealth: Payer: Self-pay | Admitting: *Deleted

## 2024-08-29 NOTE — Telephone Encounter (Signed)
 Last OV with EW 09/2023 pt was supposed to f/u with JD in 3 mos. She says no one informed her. She wants Bupropion  changed to something stronger. Patient scheduled for office visit 12/18.  Copied from CRM #[id number]. Topic: Clinical - Prescription Issue >> Aug 29, 2024  9:55 AM Alexandra Campos wrote: Reason for CRM: pt calling stated she is needing something stronger than this med buPROPion  (WELLBUTRIN  SR) 150 MG 12 hr tablet, stated she has started back smoking and needs something stronger.

## 2024-08-30 ENCOUNTER — Other Ambulatory Visit: Payer: Self-pay | Admitting: Pulmonary Disease

## 2024-08-30 DIAGNOSIS — J432 Centrilobular emphysema: Secondary | ICD-10-CM

## 2024-08-31 ENCOUNTER — Ambulatory Visit: Admitting: Nurse Practitioner

## 2024-08-31 ENCOUNTER — Encounter: Payer: Self-pay | Admitting: Nurse Practitioner

## 2024-08-31 VITALS — BP 128/76 | HR 78 | Ht 60.0 in | Wt 193.0 lb

## 2024-08-31 DIAGNOSIS — F17210 Nicotine dependence, cigarettes, uncomplicated: Secondary | ICD-10-CM | POA: Diagnosis not present

## 2024-08-31 DIAGNOSIS — J439 Emphysema, unspecified: Secondary | ICD-10-CM

## 2024-08-31 DIAGNOSIS — J449 Chronic obstructive pulmonary disease, unspecified: Secondary | ICD-10-CM

## 2024-08-31 DIAGNOSIS — J432 Centrilobular emphysema: Secondary | ICD-10-CM

## 2024-08-31 DIAGNOSIS — Z720 Tobacco use: Secondary | ICD-10-CM

## 2024-08-31 MED ORDER — FLUTICASONE-SALMETEROL 250-50 MCG/ACT IN AEPB: 1.0000 | INHALATION_SPRAY | Freq: Two times a day (BID) | RESPIRATORY_TRACT | 11 refills | Status: AC

## 2024-08-31 NOTE — Assessment & Plan Note (Signed)
 The patients current tobacco use: 2-3 cigarettes/day The patient was advised to quit and impact of smoking on their health.  I assessed the patient's willingness to attempt to quit. I provided methods and skills for cessation. We reviewed medication management of smoking session drugs if appropriate. Resources to help quit smoking were provided. A smoking cessation quit date was set: 10/02/2023 Follow-up was arranged in our clinic.  The amount of time spent counseling patient was 4 mins   Advised pt to increase Wellbutrin  150 mg to twice daily dosing, max 300 mg/day. Advised to monitor for any side effects including mood changes.

## 2024-08-31 NOTE — Assessment & Plan Note (Signed)
 Moderate COPD/emphysema. Compensated on current regimen. No recent exacerbations. Encouraged smoking cessation. Action plan in place. Inhalers refilled.   Patient Instructions  Continue Albuterol  inhaler 2 puffs every 6 hours as needed for shortness of breath or wheezing. Notify if symptoms persist despite rescue inhaler/neb use.  Increase Wellbutrin  150 mg to twice daily dosing instead of just once a day  Continue advair/wixela 1 puff Twice daily. Brush tongue and rinse mouth afterwards Continue Spiriva  2 puffs daily   Next lung cancer screening CT chest scan April 2026  Follow up in one year with Dr. Kara. If symptoms do not improve or worsen, please contact office for sooner follow up or seek emergency care.

## 2024-08-31 NOTE — Patient Instructions (Addendum)
 Continue Albuterol  inhaler 2 puffs every 6 hours as needed for shortness of breath or wheezing. Notify if symptoms persist despite rescue inhaler/neb use.  Increase Wellbutrin  150 mg to twice daily dosing instead of just once a day  Continue advair/wixela 1 puff Twice daily. Brush tongue and rinse mouth afterwards Continue Spiriva  2 puffs daily   Next lung cancer screening CT chest scan April 2026  Follow up in one year with Dr. Kara. If symptoms do not improve or worsen, please contact office for sooner follow up or seek emergency care.

## 2024-08-31 NOTE — Progress Notes (Signed)
 @Patient  ID: Alexandra Campos, female    DOB: 1956/01/18, 67 y.o.   MRN: [id number]  Chief Complaint  Patient presents with   Medical Management of Chronic Issues    Pt states over due visit needs refills     Referring provider: Lafe Domino, DO  HPI: 68 year old female, former smoker followed for COPD/emphysema. She is a patient of Dr. Luann and last seen in office 10/01/2023 by Hope PIETY. Past medical history significant for DM, CKD, HTN, obesity.   TEST/EVENTS:  10/01/2023 PFT: FVC 71, FEV1 66, ratio 72, TLC 94, DLCOcor 65  10/01/2023: OV with Hope NP. Current smoker. Variable respiratory symptoms, good days and bad days. No specific triggers noted. Uses Spiriva  and Wixela. Has a persistent Smoker's cough. Some mucus production. Trying to quit smoking. Has started taking bupropion  once a day. Encouraged to increase to twice daily. Moderate severity of COPD on PFT. Stable over last year.   08/31/2024: Today - follow up Discussed the use of AI scribe software for clinical note transcription with the patient, who gave verbal consent to proceed.  History of Present Illness   Alexandra Campos is a 68 year old female who presents for medication management and follow-up of her pulmonary condition.  She is currently taking Wellbutrin  at a dose of 150 mg once daily and would like to see if she can increase her dosing. She had quit smoking for a while but unfortunately, started back, currently smoking two to three cigarettes a day, and wants to quit again. She feels the wellbutrin  helps. She has noticed a slight return of her cough, which she attributes to smoking. No significant mucus production. Breathing is stable. No hemoptysis or unexplained weight loss. No wheezing, chest pain, palpitations, dizziness/lightheadedness.   She continues to use her Advair/Wixela inhaler and Spiriva  for her pulmonary condition. She uses her albuterol  inhaler approximately once a week, typically when she forgets to  take her other inhalers. No recent exacerbations requiring steroids or antibiotics.  Her last lung cancer screening CT was in April 2025, with the next one due April 2026.       Allergies[1]  Immunization History  Administered Date(s) Administered   Fluad Trivalent(High Dose 65+) 05/31/2023   INFLUENZA, HIGH DOSE SEASONAL PF 05/16/2024   Influenza Inj Mdck Quad Pf 07/07/2018   Influenza,inj,Quad PF,6+ Mos 06/14/2019, 07/03/2020, 05/26/2021, 07/17/2022   PFIZER Comirnaty(Gray Top)Covid-19 Tri-Sucrose Vaccine 02/26/2021, 07/01/2021   PFIZER(Purple Top)SARS-COV-2 Vaccination 11/23/2019, 12/14/2019, 07/08/2020   PNEUMOCOCCAL CONJUGATE-20 07/21/2022   PPD Test 05/06/2020, 05/05/2021   Pfizer(Comirnaty)Fall Seasonal Vaccine 12 years and older 07/23/2022, 05/31/2023, 12/29/2023, 06/23/2024   Pneumococcal Conjugate-13 07/03/2019   Pneumococcal Polysaccharide-23 07/08/2020   Tdap 07/03/2019    Past Medical History:  Diagnosis Date   Abnormal ankle brachial index (ABI) 01/14/2020   Abnormal facial hair 11/24/2019   Arthritis    Chronic kidney disease    Complex cyst of right ovary 04/09/2020   Likely a dermoid. Normal CA 125     COPD (chronic obstructive pulmonary disease) (HCC)    Diabetes mellitus without complication (HCC)    GERD (gastroesophageal reflux disease)    Hyperlipidemia    Hypertension    Ovarian cyst 03/09/2020   CA-125 17.3     Peripheral vascular disease    Postmenopausal bleeding 02/07/2020   Right upper quadrant pain 01/21/2022   Stress incontinence of urine 10/03/2019   Substance abuse (HCC)    ETOH, THC   Wheezing 12/13/2020   Yeast infection involving  the vagina and surrounding area 06/10/2020    Tobacco History: Tobacco Use History[2] Counseling given: Not Answered Tobacco comments: Patient stated that she quit smoking 10/16/2023.   Outpatient Medications Prior to Visit  Medication Sig Dispense Refill   Accu-Chek Softclix Lancets lancets Use to  check blood sugar 3 times daily 100 each 3   acetaminophen  (TYLENOL ) 650 MG CR tablet Take 650 mg by mouth every 8 (eight) hours as needed for pain.     albuterol  (VENTOLIN  HFA) 108 (90 Base) MCG/ACT inhaler INHALE 2 PUFFS BY MOUTH EVERY 4 HOURS AS NEEDED FOR WHEEZING AND FOR SHORTNESS OF BREATH . APPOINTMENT REQUIRED FOR FUTURE REFILLS 18 g 0   aspirin EC 81 MG tablet Take 81 mg by mouth daily. Swallow whole.     atorvastatin  (LIPITOR) 40 MG tablet Take 1 tablet (40 mg total) by mouth daily. 90 tablet 3   Blood Glucose Monitoring Suppl (ACCU-CHEK GUIDE) w/Device KIT Use to check blood sugar 3 times per day 1 kit 0   buPROPion  (WELLBUTRIN  SR) 150 MG 12 hr tablet Take 1 tablet by mouth twice daily (Patient taking differently: Take 150 mg by mouth daily.) 60 tablet 2   cetirizine  (ZYRTEC ) 10 MG tablet Take 1 tablet (10 mg total) by mouth daily. 30 tablet 0   chlorthalidone  (HYGROTON ) 25 MG tablet Take 1 tablet (25 mg total) by mouth daily. 90 tablet 3   diclofenac  Sodium (VOLTAREN ) 1 % GEL Apply 2 g topically 4 (four) times daily as needed (pain). 150 g 1   empagliflozin  (JARDIANCE ) 25 MG TABS tablet Take 1 tablet (25 mg total) by mouth daily. 90 tablet 3   glucose blood (ACCU-CHEK GUIDE TEST) test strip 1 each by Other route See admin instructions. E11.9 testing 3 times per day 100 each 12   losartan  (COZAAR ) 100 MG tablet Take 1 tablet (100 mg total) by mouth at bedtime. 90 tablet 3   Misc. Devices (ROLLATOR ULTRA-LIGHT) MISC Prior rollator dispensed in 2023 is irretrievably broken  Currently using a cane Cannot ambulate > 300 feet without fatigue, pain, shortness of breath Has multiple chronic conditions making it difficult to ambulate without assistance (COPD and multilevel osteoarthritis) High risk for falls without assistive device 1 each 0   Multiple Vitamin (MULTIVITAMIN PO) Take 1 tablet by mouth daily.     nystatin  cream (MYCOSTATIN ) Apply topically 2 (two) times daily. 30 g 0    omeprazole  (PRILOSEC) 20 MG capsule Take 1 capsule by mouth once daily 90 capsule 0   pregabalin  (LYRICA ) 25 MG capsule Take 1 capsule (25 mg total) by mouth 2 (two) times daily. 180 capsule 1   Semaglutide , 2 MG/DOSE, (OZEMPIC , 2 MG/DOSE,) 8 MG/3ML SOPN Inject 2 mg into the skin once a week. 3 mL 5   spironolactone  (ALDACTONE ) 25 MG tablet TAKE 1 TABLET BY MOUTH AT BEDTIME 30 tablet 11   traMADol  (ULTRAM ) 50 MG tablet Take 1-2 tablets (50-100 mg total) by mouth every 12 (twelve) hours as needed. 30 tablet 2   vitamin B-12 (CYANOCOBALAMIN) 100 MCG tablet Take 100 mcg by mouth daily.     fluticasone -salmeterol (ADVAIR) 250-50 MCG/ACT AEPB INHALE 1 PUFF IN THE MORNING AND AT BEDTIME 60 each 0   Tiotropium Bromide Monohydrate  (SPIRIVA  RESPIMAT) 2.5 MCG/ACT AERS Inhale 2 sprays into the lungs daily. 4 g 3   No facility-administered medications prior to visit.     Review of Systems: as above    Physical Exam:  BP 128/76   Pulse  78   Ht 5' (1.524 m) Comment: per pt  Wt 193 lb (87.5 kg)   SpO2 99%   BMI 37.69 kg/m   GEN: Pleasant, interactive, well-appearing; obese; in no acute distress HEENT:  Normocephalic and atraumatic. PERRLA. Sclera white. Nasal turbinates pink, moist and patent bilaterally. No rhinorrhea present. Oropharynx pink and moist, without exudate or edema. No lesions, ulcerations, or postnasal drip.  NECK:  Supple w/ fair ROM.   CV: RRR, no m/r/g, no peripheral edema. Pulses intact, +2 bilaterally. No cyanosis, pallor or clubbing. PULMONARY:  Unlabored, regular breathing. Clear bilaterally A&P w/o wheezes/rales/rhonchi. No accessory muscle use.  GI: BS present and normoactive. Soft, non-tender to palpation.  MSK: No erythema, warmth or tenderness. Cap refil <2 sec all extrem.  Neuro: A/Ox3. No focal deficits noted.   Skin: Warm, no lesions or rashe Psych: Normal affect and behavior. Judgement and thought content appropriate.     Lab Results:  CBC    Component  Value Date/Time   WBC 7.4 08/31/2023 1125   RBC 3.75 (L) 08/31/2023 1125   HGB 9.9 (L) 08/31/2023 1125   HGB 10.0 (L) 08/16/2023 1506   HCT 34.3 (L) 08/31/2023 1125   HCT 32.6 (L) 08/16/2023 1506   PLT 288 08/31/2023 1125   PLT 296 08/16/2023 1506   MCV 91.5 08/31/2023 1125   MCV 83 08/16/2023 1506   MCV 92 12/12/2012 0208   MCH 26.4 08/31/2023 1125   MCHC 28.9 (L) 08/31/2023 1125   RDW 16.6 (H) 08/31/2023 1125   RDW 14.7 08/16/2023 1506   RDW 15.1 (H) 12/12/2012 0208   LYMPHSABS 2.3 08/16/2023 1506   EOSABS 0.2 08/16/2023 1506   BASOSABS 0.1 08/16/2023 1506    BMET    Component Value Date/Time   NA 136 08/31/2023 1125   NA 142 08/16/2023 1506   NA 139 12/12/2012 1420   K 3.3 (L) 08/31/2023 1125   K 3.4 (L) 12/12/2012 1420   CL 105 08/31/2023 1125   CL 106 12/12/2012 1420   CO2 20 (L) 08/31/2023 1125   CO2 24 12/12/2012 1420   GLUCOSE 183 (H) 08/31/2023 1125   GLUCOSE 282 (H) 12/12/2012 1420   BUN 22 08/31/2023 1125   BUN 23 08/16/2023 1506   BUN 12 12/12/2012 1420   CREATININE 1.30 (H) 03/23/2024 1350   CREATININE 0.71 12/12/2012 1420   CALCIUM  9.0 08/31/2023 1125   CALCIUM  7.8 (L) 12/12/2012 1420   GFRNONAA >60 08/31/2023 1125   GFRNONAA >60 12/12/2012 1420   GFRAA 57 (L) 06/10/2020 0942   GFRAA >60 12/12/2012 1420    BNP    Component Value Date/Time   BNP 27.4 08/16/2023 1506     Imaging:  No results found.  betamethasone  acetate-betamethasone  sodium phosphate  (CELESTONE ) injection 3 mg     Date Action Dose Route User   07/03/2024 [phone] Given 3 mg Intra-articular Janit Thresa HERO, DPM          Latest Ref Rng & Units 10/01/2023    9:50 AM  PFT Results  FVC-Pre L 1.92   FVC-Predicted Pre % 71   FVC-Post L 1.85   FVC-Predicted Post % 68   Pre FEV1/FVC % % 71   Post FEV1/FCV % % 72   FEV1-Pre L 1.37   FEV1-Predicted Pre % 66   FEV1-Post L 1.34   DLCO uncorrected ml/min/mmHg 9.82   DLCO UNC% % 56   DLCO corrected ml/min/mmHg 11.25   DLCO  COR %Predicted % 65   DLVA  Predicted % 85   TLC L 4.21   TLC % Predicted % 94   RV % Predicted % 126     No results found for: NITRICOXIDE      Assessment & Plan:   Obstructive lung disease (HCC) Moderate COPD/emphysema. Compensated on current regimen. No recent exacerbations. Encouraged smoking cessation. Action plan in place. Inhalers refilled.   Patient Instructions  Continue Albuterol  inhaler 2 puffs every 6 hours as needed for shortness of breath or wheezing. Notify if symptoms persist despite rescue inhaler/neb use.  Increase Wellbutrin  150 mg to twice daily dosing instead of just once a day  Continue advair/wixela 1 puff Twice daily. Brush tongue and rinse mouth afterwards Continue Spiriva  2 puffs daily   Next lung cancer screening CT chest scan April 2026  Follow up in one year with Dr. Kara. If symptoms do not improve or worsen, please contact office for sooner follow up or seek emergency care.    Tobacco use The patients current tobacco use: 2-3 cigarettes/day The patient was advised to quit and impact of smoking on their health.  I assessed the patient's willingness to attempt to quit. I provided methods and skills for cessation. We reviewed medication management of smoking session drugs if appropriate. Resources to help quit smoking were provided. A smoking cessation quit date was set: 10/02/2023 Follow-up was arranged in our clinic.  The amount of time spent counseling patient was 4 mins   Advised pt to increase Wellbutrin  150 mg to twice daily dosing, max 300 mg/day. Advised to monitor for any side effects including mood changes.    Advised if symptoms do not improve or worsen, to please contact office for sooner follow up or seek emergency care.   I spent 35 minutes of dedicated to the care of this patient on the date of this encounter to include pre-visit review of records, face-to-face time with the patient discussing conditions above, post visit  ordering of testing, clinical documentation with the electronic health record, making appropriate referrals as documented, and communicating necessary findings to members of the patients care team.  Comer LULLA Rouleau, NP 08/31/2024  Pt aware and understands NP's role.      [1] No Known Allergies [2]  Social History Tobacco Use  Smoking Status Former   Current packs/day: 0.00   Average packs/day: 0.5 packs/day for 53.3 years (26.6 ttl pk-yrs)   Types: Cigarettes   Start date: 23   Quit date: 12/28/2023   Years since quitting: 0.6   Passive exposure: Current  Smokeless Tobacco Never  Tobacco Comments   Patient stated that she quit smoking 10/16/2023.

## 2024-09-10 ENCOUNTER — Other Ambulatory Visit: Payer: Self-pay | Admitting: Primary Care

## 2024-09-13 ENCOUNTER — Ambulatory Visit: Admitting: Physician Assistant

## 2024-09-13 DIAGNOSIS — G8929 Other chronic pain: Secondary | ICD-10-CM | POA: Diagnosis not present

## 2024-09-13 DIAGNOSIS — M1712 Unilateral primary osteoarthritis, left knee: Secondary | ICD-10-CM | POA: Diagnosis not present

## 2024-09-13 DIAGNOSIS — M5442 Lumbago with sciatica, left side: Secondary | ICD-10-CM

## 2024-09-13 NOTE — Progress Notes (Signed)
 "  Office Visit Note   Patient: Alexandra Campos           Date of Birth: [birth year]           MRN: [id number] Visit Date: 09/13/2024              Requested by: Lafe Domino, DO [address] [place],  [place] [address] PCP: Lafe Domino, DO   Assessment & Plan: Visit Diagnoses:  1. Unilateral primary osteoarthritis, left knee   2. Chronic midline low back pain with left-sided sciatica     Plan: Impression is advanced left knee osteoarthritis and chronic low back pain with left lower extremity radiculopathy.  I believe the patient is currently having symptoms from both areas.  She has inquired today about genicular artery embolization which I am not familiar with.  I have discussed genicular nerve block for which she would like to first try.  We have made a referral to Dr. Eldonna for this.  If she does not have any relief she we will follow-up with Dr. Jerri to discuss total knee arthroplasty.  I would also like for her to follow-up with Duwaine Pouch for further evaluation of her lumbar spine.  She will follow-up with me as needed.  Call with concerns or questions.  Follow-Up Instructions: Return if symptoms worsen or fail to improve.   Orders:  Orders Placed This Encounter  Procedures   Ambulatory referral to Physical Medicine Rehab   No orders of the defined types were placed in this encounter.     Procedures: No procedures performed   Clinical Data: No additional findings.   Subjective: Chief Complaint  Patient presents with   Left Knee - Pain, Follow-up    HPI patient is a pleasant 68 year old female with underlying chronic low back pain left lower extremity radiculopathy as well as advanced left knee osteoarthritis who comes in today with continued left leg pain.  Her symptoms start in the left buttock and radiate down the leg and into the foot where she has associated paresthesias at times.  She does note significantly worsened pain when she goes from a seated to  standing position as well as occasional symptoms of giving way following a sharp pain.  She has been to physical therapy for her back in the past which helped quite a bit.  I saw her in the office earlier last month where she agreed to return to PT.  She says she was not able to go and her back is continue to worsen.  She has been taking tramadol  without any significant relief.  She is inquiring today about genicular artery embolization as her daughter's friend's mother recently had this performed with good results.  She is currently not interested in repeat cortisone or viscosupplementation injections but is leaning toward total knee replacement surgery at some point in the future.  Review of Systems as detailed in HPI.  All others reviewed and are negative.   Objective: Vital Signs: There were no vitals taken for this visit.  Physical Exam well-developed well-nourished female in no acute distress.  Alert and oriented x 3.  Ortho Exam Left knee exam: Range of motion 0 to 100 degrees.  Medial and lateral joint line tenderness.  She has pain with straight leg raise.  No focal weakness.  Lumbar spine exam: No spinous or paraspinous tenderness.  Increased pain with lumbar extension.  She is neurovascularly intact distally.  Specialty Comments:  No specialty comments available.  Imaging: No  new imaging   PMFS History: Patient Active Problem List   Diagnosis Date Noted   Acne vulgaris 03/09/2024   Glossodynia 02/03/2024   Leg swelling 09/24/2023   Adnexal mass 08/31/2023   Pre-op evaluation 08/16/2023   Obstructive lung disease (HCC) 07/09/2022   Low back pain 09/22/2020   Atherosclerosis of artery of extremity with rest pain (HCC) 03/15/2020   Ovarian cyst 03/09/2020   Postmenopausal bleeding 02/07/2020   Type 2 DM with CKD stage 3 and hypertension (HCC) 07/03/2019   Class 3 severe obesity with body mass index (BMI) of 40.0 to 44.9 in adult [facility]) 06/16/2019   CKD (chronic kidney  disease) stage 3, GFR 30-59 ml/min (HCC) 06/16/2019   Healthcare maintenance 06/16/2019   Carpal tunnel syndrome on right 06/16/2019   Hypertension associated with diabetes (HCC) 08/04/2018   Tobacco use 08/04/2018   Past Medical History:  Diagnosis Date   Abnormal ankle brachial index (ABI) 01/14/2020   Abnormal facial hair 11/24/2019   Arthritis    Chronic kidney disease    Complex cyst of right ovary 04/09/2020   Likely a dermoid. Normal CA 125     COPD (chronic obstructive pulmonary disease) (HCC)    Diabetes mellitus without complication (HCC)    GERD (gastroesophageal reflux disease)    Hyperlipidemia    Hypertension    Ovarian cyst 03/09/2020   CA-125 17.3     Peripheral vascular disease    Postmenopausal bleeding 02/07/2020   Right upper quadrant pain 01/21/2022   Stress incontinence of urine 10/03/2019   Substance abuse (HCC)    ETOH, THC   Wheezing 12/13/2020   Yeast infection involving the vagina and surrounding area 06/10/2020    Family History  Problem Relation Age of Onset   Stomach cancer Mother    Diabetes Mother    Hypertension Mother    Dementia Mother    Diabetes Father    Hypertension Father    Leukemia Father    Diabetes Sister    Diabetes Sister    Diabetes Brother    Hypertension Brother    Colon cancer Brother 50   Diabetes Brother    Diabetes Maternal Grandmother    Hypertension Maternal Grandmother    Diabetes Daughter    Hypertension Daughter    Diabetes Son    Cancer Paternal Uncle        unknown cancer   Colon polyps Neg Hx    Esophageal cancer Neg Hx    Rectal cancer Neg Hx    Breast cancer Neg Hx    Ovarian cancer Neg Hx    Endometrial cancer Neg Hx     Past Surgical History:  Procedure Laterality Date   ABDOMINAL AORTOGRAM W/LOWER EXTREMITY N/A 03/15/2020   Procedure: ABDOMINAL AORTOGRAM W/LOWER EXTREMITY;  Surgeon: Eliza Lonni RAMAN, MD;  [facility] INVASIVE CV LAB;  Service: Cardiovascular;  Laterality: N/A;    ANKLE SURGERY Right    ORIF   CARPAL TUNNEL RELEASE Right 11/18/2022   Procedure: RIGHT CARPAL TUNNEL RELEASE;  Surgeon: Jerri Kay HERO, MD;  Location: [facility];  Service: Orthopedics;  Laterality: Right;   DILATATION & CURETTAGE/HYSTEROSCOPY WITH MYOSURE N/A 08/31/2023   Procedure: DILATATION & CURETTAGE/HYSTEROSCOPY WITH MYOSURE;  Surgeon: Eldonna Mays, MD;  Location: [facility];  Service: Gynecology;  Laterality: N/A;   ROBOTIC ASSISTED BILATERAL SALPINGO OOPHERECTOMY Bilateral 08/31/2023   Procedure: XI ROBOTIC ASSISTED BILATERAL SALPINGO OOPHORECTOMY;  Surgeon: Eldonna Mays, MD;  Location: [facility];  Service: Gynecology;  Laterality: Bilateral;  THROAT SURGERY     Reports polyps removed from throat, done at [facility] approx 15 years ago   TONSILLECTOMY     TUBAL LIGATION Bilateral    WISDOM TOOTH EXTRACTION     Social History   Occupational History   Occupation: CNA    Comment: Retired  Tobacco Use   Smoking status: Former    Current packs/day: 0.00    Average packs/day: 0.5 packs/day for 53.3 years (26.6 ttl pk-yrs)    Types: Cigarettes    Start date: 33    Quit date: 12/28/2023    Years since quitting: 0.7    Passive exposure: Current   Smokeless tobacco: Never   Tobacco comments:    Patient stated that she quit smoking 10/16/2023.  Vaping Use   Vaping status: Never Used  Substance and Sexual Activity   Alcohol use: Yes    Alcohol/week: 5.0 standard drinks of alcohol    Types: 5 Cans of beer per week    Comment: 5 drinks/week    Drug use: Yes    Types: Marijuana    Comment: last smoked cig today, last marijuana 11/16/2022   Sexual activity: Not Currently        "

## 2024-09-16 ENCOUNTER — Other Ambulatory Visit: Payer: Self-pay | Admitting: Physician Assistant

## 2024-09-20 ENCOUNTER — Encounter: Payer: Self-pay | Admitting: Physical Medicine and Rehabilitation

## 2024-09-20 ENCOUNTER — Ambulatory Visit: Admitting: Physical Medicine and Rehabilitation

## 2024-09-20 DIAGNOSIS — M1712 Unilateral primary osteoarthritis, left knee: Secondary | ICD-10-CM | POA: Diagnosis not present

## 2024-09-20 DIAGNOSIS — G8929 Other chronic pain: Secondary | ICD-10-CM

## 2024-09-20 DIAGNOSIS — M25562 Pain in left knee: Secondary | ICD-10-CM | POA: Diagnosis not present

## 2024-09-20 NOTE — Progress Notes (Signed)
 "  Alexandra Campos - 69 y.o. female MRN [id number]  Date of birth: [birth year]  Office Visit Note: Visit Date: 09/20/2024 PCP: Lafe Domino, DO Referred by: Lafe Domino, DO  Subjective: Chief Complaint  Patient presents with   Left Knee - Pain   HPI: Alexandra Campos is a 69 y.o. female who comes in today per the request of Alexandra Campos, [place] for evaluation of chronic, worsening and severe left knee pain. She was referred to us  to discuss genicular nerve blocks and possible radiofrequency ablation. She reports chronic issues with left knee pain for many years, worsens with walking and activity. She describes pain as throbbing sensation, currently rates as 7 out of 10. Some relief of pain with home exercise regimen, rest and use of medications. She does take Tylenol  #3 as needed. History of both corticosteroid and gel injection. She reports some relief with gel injections initially but more short term. Recent left knee radiographs show advanced tricompartmental degenerative changes to left knee. She does have history of bilateral lower back pain radiating down both lower extremities, however left knee pain seems to be biggest pain generator for her. She has discussed left knee replacement with Alexandra and Dr. Jerri, she would like to hold on any surgical intervention at this time. Patient denies focal weakness, numbness and tingling. No recent trauma or falls.   Patients course is complicated by tobacco abuse, obstructive lung disease, hypertension, diabetes mellitus and chronic kidney disease.      Review of Systems  Musculoskeletal:  Positive for joint pain.  Neurological:  Negative for tingling, sensory change, focal weakness and weakness.  All other systems reviewed and are negative.  Otherwise per HPI.  Assessment & Plan: Visit Diagnoses:    ICD-10-CM   1. Chronic pain of left knee  M25.562 Ambulatory referral to Physical Medicine Rehab   G89.29     2. Unilateral primary  osteoarthritis, left knee  M17.12 Ambulatory referral to Physical Medicine Rehab       Plan: Findings:  Chronic, worsening and severe left knee pain. Patient continues to have severe pain despite good conservative therapies such as home exercise regimen, rest and use of medications. There is severe tricompartmental arthritis to left knee that is certainly a pain generator for her. We discussed treatment plan in detail today. Next step is to perform diagnostic left genicular nerve block under fluoroscopic guidance. If good relief of pain with diagnostic blocks we discussed longer sustained pain relief with radiofrequency ablation. I discussed double block paradigm with her and explained radiofrequency ablation procedure in detail. She has no questions at this time. She did inquire about a procedure called genicular artery embolization. I explained to her that this is not a procedure we perform in our office, does sound more like a vascular procedure that would need to be performed in the hospital setting. She can speak with her Dr. Pearline with vascular surgery about this. She has tenderness to both medial and lateral joint lines of left knee today. Pain with range of motion. She is wearing left knee brace during our visit today. No red flag symptoms noted. We will see her back for genicular nerve blocks.     Meds & Orders: No orders of the defined types were placed in this encounter.   Orders Placed This Encounter  Procedures   Ambulatory referral to Physical Medicine Rehab    Follow-up: Return for Left genicular nerve block.   Procedures: No procedures performed  Clinical History: No specialty comments available.   She reports that she quit smoking about 8 months ago. Her smoking use included cigarettes. She started smoking about 54 years ago. She has a 26.6 pack-year smoking history. She has been exposed to tobacco smoke. She has never used smokeless tobacco.  Recent Labs     11/22/23 1015 03/09/24 1514  HGBA1C 6.9 6.9    Objective:  VS:  HT:    WT:   BMI:     BP:   HR: bpm  TEMP: ( )  RESP:  Physical Exam Vitals and nursing note reviewed.  HENT:     Head: Normocephalic and atraumatic.     Right Ear: External ear normal.     Left Ear: External ear normal.     Nose: Nose normal.     Mouth/Throat:     Mouth: Mucous membranes are moist.  Eyes:     Extraocular Movements: Extraocular movements intact.  Cardiovascular:     Rate and Rhythm: Normal rate.     Pulses: Normal pulses.  Pulmonary:     Effort: Pulmonary effort is normal.  Abdominal:     General: Abdomen is flat. There is no distension.  Musculoskeletal:        General: Tenderness present.     Cervical back: Normal range of motion.     Comments: Tenderness noted upon palpation of both medial and lateral joint lines of left knee. Limited and painful range of motion to left knee.   Skin:    General: Skin is warm and dry.     Capillary Refill: Capillary refill takes less than 2 seconds.  Neurological:     General: No focal deficit present.     Mental Status: She is alert and oriented to person, place, and time.  Psychiatric:        Mood and Affect: Mood normal.        Behavior: Behavior normal.     Ortho Exam  Imaging: No results found.  Past Medical/Family/Surgical/Social History: Medications & Allergies reviewed per EMR, new medications updated. Patient Active Problem List   Diagnosis Date Noted   Acne vulgaris 03/09/2024   Glossodynia 02/03/2024   Leg swelling 09/24/2023   Adnexal mass 08/31/2023   Pre-op evaluation 08/16/2023   Obstructive lung disease (HCC) 07/09/2022   Low back pain 09/22/2020   Atherosclerosis of artery of extremity with rest pain (HCC) 03/15/2020   Ovarian cyst 03/09/2020   Postmenopausal bleeding 02/07/2020   Type 2 DM with CKD stage 3 and hypertension (HCC) 07/03/2019   Class 3 severe obesity with body mass index (BMI) of 40.0 to 44.9 in adult  [facility]) 06/16/2019   CKD (chronic kidney disease) stage 3, GFR 30-59 ml/min (HCC) 06/16/2019   Healthcare maintenance 06/16/2019   Carpal tunnel syndrome on right 06/16/2019   Hypertension associated with diabetes (HCC) 08/04/2018   Tobacco use 08/04/2018   Past Medical History:  Diagnosis Date   Abnormal ankle brachial index (ABI) 01/14/2020   Abnormal facial hair 11/24/2019   Arthritis    Chronic kidney disease    Complex cyst of right ovary 04/09/2020   Likely a dermoid. Normal CA 125     COPD (chronic obstructive pulmonary disease) (HCC)    Diabetes mellitus without complication (HCC)    GERD (gastroesophageal reflux disease)    Hyperlipidemia    Hypertension    Ovarian cyst 03/09/2020   CA-125 17.3     Peripheral vascular disease    Postmenopausal bleeding 02/07/2020  Right upper quadrant pain 01/21/2022   Stress incontinence of urine 10/03/2019   Substance abuse (HCC)    ETOH, THC   Wheezing 12/13/2020   Yeast infection involving the vagina and surrounding area 06/10/2020   Family History  Problem Relation Age of Onset   Stomach cancer Mother    Diabetes Mother    Hypertension Mother    Dementia Mother    Diabetes Father    Hypertension Father    Leukemia Father    Diabetes Sister    Diabetes Sister    Diabetes Brother    Hypertension Brother    Colon cancer Brother 50   Diabetes Brother    Diabetes Maternal Grandmother    Hypertension Maternal Grandmother    Diabetes Daughter    Hypertension Daughter    Diabetes Son    Cancer Paternal Uncle        unknown cancer   Colon polyps Neg Hx    Esophageal cancer Neg Hx    Rectal cancer Neg Hx    Breast cancer Neg Hx    Ovarian cancer Neg Hx    Endometrial cancer Neg Hx    Past Surgical History:  Procedure Laterality Date   ABDOMINAL AORTOGRAM W/LOWER EXTREMITY N/A 03/15/2020   Procedure: ABDOMINAL AORTOGRAM W/LOWER EXTREMITY;  Surgeon: Eliza Lonni RAMAN, MD;  [facility] INVASIVE CV LAB;  Service:  Cardiovascular;  Laterality: N/A;   ANKLE SURGERY Right    ORIF   CARPAL TUNNEL RELEASE Right 11/18/2022   Procedure: RIGHT CARPAL TUNNEL RELEASE;  Surgeon: Alexandra Campos Kay HERO, MD;  Location: [facility];  Service: Orthopedics;  Laterality: Right;   DILATATION & CURETTAGE/HYSTEROSCOPY WITH MYOSURE N/A 08/31/2023   Procedure: DILATATION & CURETTAGE/HYSTEROSCOPY WITH MYOSURE;  Surgeon: Eldonna Mays, MD;  Location: [facility];  Service: Gynecology;  Laterality: N/A;   ROBOTIC ASSISTED BILATERAL SALPINGO OOPHERECTOMY Bilateral 08/31/2023   Procedure: XI ROBOTIC ASSISTED BILATERAL SALPINGO OOPHORECTOMY;  Surgeon: Eldonna Mays, MD;  Location: [facility];  Service: Gynecology;  Laterality: Bilateral;   THROAT SURGERY     Reports polyps removed from throat, done at [facility] approx 15 years ago   TONSILLECTOMY     TUBAL LIGATION Bilateral    WISDOM TOOTH EXTRACTION     Social History   Occupational History   Occupation: CNA    Comment: Retired  Tobacco Use   Smoking status: Former    Current packs/day: 0.00    Average packs/day: 0.5 packs/day for 53.3 years (26.6 ttl pk-yrs)    Types: Cigarettes    Start date: 51    Quit date: 12/28/2023    Years since quitting: 0.7    Passive exposure: Current   Smokeless tobacco: Never   Tobacco comments:    Patient stated that she quit smoking 10/16/2023.  Vaping Use   Vaping status: Never Used  Substance and Sexual Activity   Alcohol use: Yes    Alcohol/week: 5.0 standard drinks of alcohol    Types: 5 Cans of beer per week    Comment: 5 drinks/week    Drug use: Yes    Types: Marijuana    Comment: last smoked cig today, last marijuana 11/16/2022   Sexual activity: Not Currently   "

## 2024-09-20 NOTE — Progress Notes (Signed)
 Pain Scale   Average Pain 10 Patient advising she has left knee pain that is constant.        +Driver, -BT, -Dye Allergies.

## 2024-10-01 ENCOUNTER — Other Ambulatory Visit: Payer: Self-pay | Admitting: Family Medicine

## 2024-10-03 ENCOUNTER — Ambulatory Visit: Admitting: Podiatry

## 2024-10-03 DIAGNOSIS — E1151 Type 2 diabetes mellitus with diabetic peripheral angiopathy without gangrene: Secondary | ICD-10-CM

## 2024-10-03 DIAGNOSIS — M79674 Pain in right toe(s): Secondary | ICD-10-CM

## 2024-10-03 DIAGNOSIS — B351 Tinea unguium: Secondary | ICD-10-CM

## 2024-10-03 DIAGNOSIS — M79675 Pain in left toe(s): Secondary | ICD-10-CM

## 2024-10-03 NOTE — Progress Notes (Signed)
 This patient returns to my office for at risk foot care.  This patient requires this care by a professional since this patient will be at risk due to having CKD, DNM and PVD. This patient is unable to cut nails himself since the patient cannot reach his nails.These nails are painful walking and wearing shoes.  This patient presents for at risk foot care today.  General Appearance  Alert, conversant and in no acute stress.  Vascular  Dorsalis pedis and posterior tibial  pulses are weakly  palpable  bilaterally.  Capillary return is within normal limits  bilaterally. Temperature is within normal limits  bilaterally.  Neurologic  Senn-Weinstein monofilament wire test diminished  bilaterally. Muscle power within normal limits bilaterally.  Nails Thick disfigured discolored nails with subungual debris  from hallux to fifth toes bilaterally. No evidence of bacterial infection or drainage bilaterally.  Orthopedic  No limitations of motion  feet .  No crepitus or effusions noted.  No bony pathology or digital deformities noted.  Skin  normotropic skin with no porokeratosis noted bilaterally.  No signs of infections or ulcers noted.     Onychomycosis  Pain in right toes  Pain in left toes  Consent was obtained for treatment procedures.   Mechanical debridement of nails 1-5  bilaterally performed with a nail nipper.  Filed with dremel without incident. Patient says she has severe ingrown toenail medial border left hallux.  Told her to see Dr Tobie for evaluation of nail and possible surgery.   Return office visit   3 months for [facility].                  Told patient to return for periodic foot care and evaluation due to potential at risk complications.   Cordella Bold DPM

## 2024-10-05 ENCOUNTER — Ambulatory Visit (HOSPITAL_BASED_OUTPATIENT_CLINIC_OR_DEPARTMENT_OTHER)
Admission: RE | Admit: 2024-10-05 | Discharge: 2024-10-05 | Disposition: A | Discharge status: Home / Self Care | Source: Ambulatory Visit

## 2024-10-05 DIAGNOSIS — N183 Chronic kidney disease, stage 3 unspecified: Secondary | ICD-10-CM | POA: Diagnosis not present

## 2024-10-05 DIAGNOSIS — M810 Age-related osteoporosis without current pathological fracture: Secondary | ICD-10-CM

## 2024-10-05 DIAGNOSIS — I129 Hypertensive chronic kidney disease with stage 1 through stage 4 chronic kidney disease, or unspecified chronic kidney disease: Secondary | ICD-10-CM | POA: Diagnosis present

## 2024-10-05 DIAGNOSIS — Z9189 Other specified personal risk factors, not elsewhere classified: Secondary | ICD-10-CM | POA: Diagnosis present

## 2024-10-05 DIAGNOSIS — E1122 Type 2 diabetes mellitus with diabetic chronic kidney disease: Secondary | ICD-10-CM | POA: Insufficient documentation

## 2024-10-05 DIAGNOSIS — M8580 Other specified disorders of bone density and structure, unspecified site: Secondary | ICD-10-CM | POA: Diagnosis not present

## 2024-10-05 DIAGNOSIS — Z780 Asymptomatic menopausal state: Secondary | ICD-10-CM | POA: Diagnosis not present

## 2024-10-05 HISTORY — DX: Age-related osteoporosis without current pathological fracture: M81.0

## 2024-10-10 ENCOUNTER — Telehealth: Payer: Self-pay

## 2024-10-10 ENCOUNTER — Encounter: Payer: Self-pay | Admitting: Family Medicine

## 2024-10-10 NOTE — Telephone Encounter (Signed)
 Tried calling but went to vm. If she calls back, can you see what she needs?

## 2024-10-10 NOTE — Telephone Encounter (Signed)
 Patient would like a call back  at [phone].  Patient did not state what she needed.

## 2024-10-10 NOTE — Telephone Encounter (Signed)
 Yes, I will. Thank you

## 2024-10-17 ENCOUNTER — Ambulatory Visit: Payer: Self-pay | Admitting: Family Medicine

## 2024-10-17 NOTE — Telephone Encounter (Signed)
 Called with results of DEXA. T score -1.3. repeat in 10 years. Discussed dietary Calcium  and D. All questions answered.  Suzann Daring, MD  Family Medicine Teaching Service

## 2024-10-18 ENCOUNTER — Other Ambulatory Visit: Payer: Self-pay | Admitting: Family Medicine

## 2024-10-19 ENCOUNTER — Other Ambulatory Visit: Payer: Self-pay | Admitting: Physician Assistant

## 2024-10-19 ENCOUNTER — Other Ambulatory Visit: Payer: Self-pay

## 2024-10-19 ENCOUNTER — Telehealth: Payer: Self-pay

## 2024-10-19 ENCOUNTER — Ambulatory Visit: Admitting: Orthopaedic Surgery

## 2024-10-19 VITALS — Ht 58.5 in | Wt 193.0 lb

## 2024-10-19 DIAGNOSIS — M1712 Unilateral primary osteoarthritis, left knee: Secondary | ICD-10-CM

## 2024-10-19 DIAGNOSIS — E1122 Type 2 diabetes mellitus with diabetic chronic kidney disease: Secondary | ICD-10-CM | POA: Diagnosis not present

## 2024-10-19 DIAGNOSIS — N183 Chronic kidney disease, stage 3 unspecified: Secondary | ICD-10-CM

## 2024-10-19 DIAGNOSIS — I129 Hypertensive chronic kidney disease with stage 1 through stage 4 chronic kidney disease, or unspecified chronic kidney disease: Secondary | ICD-10-CM

## 2024-10-19 LAB — POCT GLYCOSYLATED HEMOGLOBIN (HGB A1C): Hemoglobin A1C: 6.6 % — AB (ref 4.0–5.6)

## 2024-10-19 NOTE — Telephone Encounter (Signed)
 Patient given surgical clearance form for PCP. Aware that we must receive clearance form back before being able to proceed with scheduling surgery.

## 2024-10-19 NOTE — Progress Notes (Signed)
 "  Office Visit Note   Patient: Alexandra Campos           Date of Birth: 05-Nov-1955           MRN: [id number] Visit Date: 10/19/2024              Requested by: Lafe Domino, DO [address] [place],  [place] [address] PCP: Lafe Domino, DO   Assessment & Plan: Visit Diagnoses:  1. Primary osteoarthritis of left knee   2. Type 2 DM with CKD stage 3 and hypertension (HCC)     Plan: Impression is end-stage left knee tricompartmental osteoarthritis that has not responded to conservative treatments.  Impression is severe left knee degenerative joint disease secondary to Osteoarthritis.  Patient has attempted conservative treatment for at least 6 consecutive weeks within the past 12 weeks, including but not limited to physical therapy, home exercise program, NSAIDs, activity modification, and/or corticosteroid injections. Despite these efforts, symptoms have not improved or have worsened. Conservative measures have been deemed unsuccessful at this time. After a detailed discussion covering diagnosis and treatment options--including the risks, benefits, alternatives, and potential complications of surgical and nonsurgical management--the patient elected to proceed with surgery  Anticoagulants: No antithrombotic Postop anticoagulation: Eliquis Diabetic: Yes  Nickel allergy: No Prior DVT/PE: No Tobacco use: No Clearances needed for surgery: PCP Anticipated discharge dispo: Home   Follow-Up Instructions: No follow-ups on file.   Orders:  Orders Placed This Encounter  Procedures   XR KNEE 3 VIEW LEFT   POCT HgB A1C   No orders of the defined types were placed in this encounter.     Procedures: No procedures performed   Clinical Data: No additional findings.   Subjective: Chief Complaint  Patient presents with   Left Knee - Pain    HPI [name] returns today for follow-up evaluation of severe left knee pain.  Of the genicular nerve block was denied by her insurance.   She still remains in a lot of pain.  She is taking Tylenol  3 currently. Review of Systems  Constitutional: Negative.   HENT: Negative.    Eyes: Negative.   Respiratory: Negative.    Cardiovascular: Negative.   Endocrine: Negative.   Musculoskeletal: Negative.   Neurological: Negative.   Hematological: Negative.   Psychiatric/Behavioral: Negative.    All other systems reviewed and are negative.    Objective: Vital Signs: Ht 4' 10.5 (1.486 m)   Wt 193 lb (87.5 kg)   BMI 39.65 kg/m   Physical Exam Vitals and nursing note reviewed.  Constitutional:      Appearance: She is well-developed.  HENT:     Head: Atraumatic.     Nose: Nose normal.  Eyes:     Extraocular Movements: Extraocular movements intact.  Cardiovascular:     Pulses: Normal pulses.  Pulmonary:     Effort: Pulmonary effort is normal.  Abdominal:     Palpations: Abdomen is soft.  Musculoskeletal:     Cervical back: Neck supple.  Skin:    General: Skin is warm.     Capillary Refill: Capillary refill takes less than 2 seconds.  Neurological:     Mental Status: She is alert. Mental status is at baseline.  Psychiatric:        Behavior: Behavior normal.        Thought Content: Thought content normal.        Judgment: Judgment normal.     Ortho Exam Examination of the left knee shows severe  pain with range of motion and weightbearing.  There is crepitus throughout range of motion.  Collaterals and cruciates are stable.  Lateral joint line tenderness. Specialty Comments:  No specialty comments available.  Imaging: XR KNEE 3 VIEW LEFT Result Date: 10/19/2024 X-rays of the left knee show advanced tricompartmental osteoarthritis.  Bone-on-bone joint space narrowing.  Kellgren-Lawrence stage IV    PMFS History: Patient Active Problem List   Diagnosis Date Noted   Primary osteoarthritis of left knee 10/19/2024   Acne vulgaris 03/09/2024   Glossodynia 02/03/2024   Leg swelling 09/24/2023   Adnexal mass  08/31/2023   Pre-op evaluation 08/16/2023   Obstructive lung disease (HCC) 07/09/2022   Low back pain 09/22/2020   Atherosclerosis of artery of extremity with rest pain (HCC) 03/15/2020   Ovarian cyst 03/09/2020   Postmenopausal bleeding 02/07/2020   Type 2 DM with CKD stage 3 and hypertension (HCC) 07/03/2019   Class 3 severe obesity with body mass index (BMI) of 40.0 to 44.9 in adult [facility]) 06/16/2019   CKD (chronic kidney disease) stage 3, GFR 30-59 ml/min (HCC) 06/16/2019   Healthcare maintenance 06/16/2019   Carpal tunnel syndrome on right 06/16/2019   Hypertension associated with diabetes (HCC) 08/04/2018   Tobacco use 08/04/2018   Past Medical History:  Diagnosis Date   Abnormal ankle brachial index (ABI) 01/14/2020   Abnormal facial hair 11/24/2019   Arthritis    Chronic kidney disease    Complex cyst of right ovary 04/09/2020   Likely a dermoid. Normal CA 125     COPD (chronic obstructive pulmonary disease) (HCC)    Diabetes mellitus without complication (HCC)    GERD (gastroesophageal reflux disease)    Hyperlipidemia    Hypertension    Osteoporosis 10/05/2024   Based on DEXA scan   Ovarian cyst 03/09/2020   CA-125 17.3     Peripheral vascular disease    Postmenopausal bleeding 02/07/2020   Right upper quadrant pain 01/21/2022   Stress incontinence of urine 10/03/2019   Substance abuse (HCC)    ETOH, THC   Wheezing 12/13/2020   Yeast infection involving the vagina and surrounding area 06/10/2020    Family History  Problem Relation Age of Onset   Stomach cancer Mother    Diabetes Mother    Hypertension Mother    Dementia Mother    Diabetes Father    Hypertension Father    Leukemia Father    Diabetes Sister    Diabetes Sister    Diabetes Brother    Hypertension Brother    Colon cancer Brother 50   Diabetes Brother    Diabetes Maternal Grandmother    Hypertension Maternal Grandmother    Diabetes Daughter    Hypertension Daughter    Diabetes Son     Cancer Paternal Uncle        unknown cancer   Colon polyps Neg Hx    Esophageal cancer Neg Hx    Rectal cancer Neg Hx    Breast cancer Neg Hx    Ovarian cancer Neg Hx    Endometrial cancer Neg Hx     Past Surgical History:  Procedure Laterality Date   ABDOMINAL AORTOGRAM W/LOWER EXTREMITY N/A 03/15/2020   Procedure: ABDOMINAL AORTOGRAM W/LOWER EXTREMITY;  Surgeon: Eliza Lonni RAMAN, MD;  Location: [facility] INVASIVE CV LAB;  Service: Cardiovascular;  Laterality: N/A;   ANKLE SURGERY Right    ORIF   CARPAL TUNNEL RELEASE Right 11/18/2022   Procedure: RIGHT CARPAL TUNNEL RELEASE;  Surgeon: Jerri Kay HERO,  MD;  Location: [facility];  Service: Orthopedics;  Laterality: Right;   DILATATION & CURETTAGE/HYSTEROSCOPY WITH MYOSURE N/A 08/31/2023   Procedure: DILATATION & CURETTAGE/HYSTEROSCOPY WITH MYOSURE;  Surgeon: Eldonna Mays, MD;  Location: [facility];  Service: Gynecology;  Laterality: N/A;   ROBOTIC ASSISTED BILATERAL SALPINGO OOPHERECTOMY Bilateral 08/31/2023   Procedure: XI ROBOTIC ASSISTED BILATERAL SALPINGO OOPHORECTOMY;  Surgeon: Eldonna Mays, MD;  Location: [facility];  Service: Gynecology;  Laterality: Bilateral;   THROAT SURGERY     Reports polyps removed from throat, done at [facility] approx 15 years ago   TONSILLECTOMY     TUBAL LIGATION Bilateral    WISDOM TOOTH EXTRACTION     Social History   Occupational History   Occupation: CNA    Comment: Retired  Tobacco Use   Smoking status: Former    Current packs/day: 0.00    Average packs/day: 0.5 packs/day for 53.3 years (26.6 ttl pk-yrs)    Types: Cigarettes    Start date: 68    Quit date: 12/28/2023    Years since quitting: 0.8    Passive exposure: Current   Smokeless tobacco: Never   Tobacco comments:    Patient stated that she quit smoking 10/16/2023.  Vaping Use   Vaping status: Never Used  Substance and Sexual Activity   Alcohol use: Yes    Alcohol/week: 5.0 standard drinks of alcohol    Types: 5 Cans  of beer per week    Comment: 5 drinks/week    Drug use: Yes    Types: Marijuana    Comment: last smoked cig today, last marijuana 11/16/2022   Sexual activity: Not Currently        "

## 2024-10-20 ENCOUNTER — Ambulatory Visit: Admitting: Podiatry

## 2024-10-20 NOTE — Progress Notes (Unsigned)
 Left distal hallux callus with tight shoes surgica shoes no wound

## 2024-11-01 ENCOUNTER — Ambulatory Visit: Payer: Self-pay | Admitting: Family Medicine

## 2024-11-13 ENCOUNTER — Ambulatory Visit (HOSPITAL_COMMUNITY): Admit: 2024-11-13 | Admitting: Orthopaedic Surgery

## 2024-11-27 ENCOUNTER — Encounter

## 2024-11-28 ENCOUNTER — Encounter: Admitting: Physician Assistant

## 2025-01-01 ENCOUNTER — Ambulatory Visit: Admitting: Podiatry
